# Patient Record
Sex: Female | Born: 1975
Health system: Southern US, Community
[De-identification: ages and names within clinical notes are randomized; demographics above are authoritative.]

## PROBLEM LIST (undated history)

## (undated) DIAGNOSIS — C801 Malignant (primary) neoplasm, unspecified: Secondary | ICD-10-CM

## (undated) DIAGNOSIS — Z923 Personal history of irradiation: Secondary | ICD-10-CM

## (undated) DIAGNOSIS — T7840XA Allergy, unspecified, initial encounter: Secondary | ICD-10-CM

## (undated) DIAGNOSIS — R55 Syncope and collapse: Secondary | ICD-10-CM

## (undated) DIAGNOSIS — Z9221 Personal history of antineoplastic chemotherapy: Secondary | ICD-10-CM

## (undated) DIAGNOSIS — C50919 Malignant neoplasm of unspecified site of unspecified female breast: Secondary | ICD-10-CM

## (undated) DIAGNOSIS — Z853 Personal history of malignant neoplasm of breast: Secondary | ICD-10-CM

## (undated) DIAGNOSIS — I471 Supraventricular tachycardia: Secondary | ICD-10-CM

## (undated) DIAGNOSIS — R002 Palpitations: Secondary | ICD-10-CM

## (undated) DIAGNOSIS — L989 Disorder of the skin and subcutaneous tissue, unspecified: Secondary | ICD-10-CM

## (undated) DIAGNOSIS — D649 Anemia, unspecified: Secondary | ICD-10-CM

## (undated) DIAGNOSIS — I4719 Other supraventricular tachycardia: Secondary | ICD-10-CM

## (undated) HISTORY — PX: BREAST LUMPECTOMY: SHX2

## (undated) HISTORY — DX: Allergy, unspecified, initial encounter: T78.40XA

## (undated) HISTORY — DX: Palpitations: R00.2

## (undated) HISTORY — DX: Syncope and collapse: R55

## (undated) HISTORY — PX: LAPAROSCOPIC OOPHERECTOMY: SHX6507

## (undated) HISTORY — DX: Other supraventricular tachycardia: I47.19

## (undated) HISTORY — DX: Supraventricular tachycardia: I47.1

## (undated) HISTORY — PX: AUGMENTATION MAMMAPLASTY: SUR837

## (undated) HISTORY — PX: MASTECTOMY: SHX3

---
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
## 2004-09-18 ENCOUNTER — Ambulatory Visit: Payer: Self-pay | Admitting: Obstetrics & Gynecology

## 2005-07-31 ENCOUNTER — Inpatient Hospital Stay: Payer: Self-pay | Admitting: Obstetrics & Gynecology

## 2008-10-29 ENCOUNTER — Ambulatory Visit (HOSPITAL_COMMUNITY): Admission: RE | Admit: 2008-10-29 | Discharge: 2008-10-29 | Payer: Self-pay | Admitting: Obstetrics and Gynecology

## 2009-03-17 ENCOUNTER — Inpatient Hospital Stay (HOSPITAL_COMMUNITY): Admission: RE | Admit: 2009-03-17 | Discharge: 2009-03-19 | Payer: Self-pay | Admitting: Obstetrics and Gynecology

## 2011-03-24 LAB — CBC
HCT: 32.4 % — ABNORMAL LOW (ref 36.0–46.0)
Hemoglobin: 10.6 g/dL — ABNORMAL LOW (ref 12.0–15.0)
Hemoglobin: 9.9 g/dL — ABNORMAL LOW (ref 12.0–15.0)
MCV: 86.4 fL (ref 78.0–100.0)
Platelets: 197 10*3/uL (ref 150–400)
WBC: 6.6 10*3/uL (ref 4.0–10.5)

## 2011-03-24 LAB — CCBB MATERNAL DONOR DRAW

## 2011-04-27 NOTE — Discharge Summary (Signed)
NAME:  Nancy Austin, Nancy Austin                 ACCOUNT NO.:  [id number]   MEDICAL RECORD NO.:  [id number]          PATIENT TYPE:  INP   LOCATION:  9108                          FACILITY:  [facility]   PHYSICIAN:  Huel Cote, M.D. DATE OF BIRTH:  1976/07/25   DATE OF ADMISSION:  03/17/2009  DATE OF DISCHARGE:  03/19/2009                               DISCHARGE SUMMARY   DISCHARGE DIAGNOSES:  1. Term pregnancy at 39-6/7th weeks, delivered.  2. Status post normal spontaneous vaginal delivery.   DISCHARGE MEDICATIONS:  Motrin 600 mg p.o. every 6 hours.   DISCHARGE FOLLOWUP:  The patient is to follow up in the office in 6  weeks for her full postpartum exam.   HOSPITAL COURSE:  The patient is a 35 year old G2, P1-0-0-1, who was  admitted at 39-6/7th weeks' gestation for induction of labor, given term  status and a favorable cervix.  Prenatal care was complicated by cleft  lip on the fetal left.  There were no other findings or issues noted.  The patient who has met with plastic surgeons in prior to delivery in  order to prepare for the repair of this after birth.   PRENATAL LABORATORY DATA:  Are as follows:  O positive, antibody negative, rubella immune, hepatitis B surface  antigen negative, HIV negative, GC negative, chlamydia negative, RPR  negative, group B strep negative, 1-hour Glucola 82.  First trimester  screen normal.   PAST OBSTETRICAL HISTORY:  Significant for a vaginal delivery, 7 pounds  11 ounces infant in 2006.   PAST GYN HISTORY:  History of irregular cycles, conceived her first  child on Clomid; however, spontaneously this pregnancy.   PAST SURGICAL HISTORY:  Tonsillectomy and wisdom teeth.   PAST MEDICAL HISTORY:  None.   ALLERGIES:  Morphine.   MEDICATIONS:  Prenatal vitamins.   On admission she was 50.  Cervix was 50% effaced, 2 cm, and -2 station.  She had rupture of membranes with clear fluid noted.  Fetal heart rate  was reactive.  She progressed quickly  throughout the day, went to  complete dilation, and pushed well with a normal spontaneous vaginal  delivery of a vigorous female infant over a small first-degree  laceration.  Apgars were 8 and 9, weight was 7 pounds 2 ounces.  Baby  had a cleft lip on the left and the palate looked intact.  Placenta  delivered spontaneously.  There is a first-degree laceration repaired  with 2-0 Vicryl for hemostasis.  Cervix and rectum were intact.  Estimated blood loss was 350 mL.  Baby was doing well.  The mother  began nursing and did well postpartum.  On postpartum day #2, she was  having no complaints.  Pain was well controlled.  Hemoglobin was 9.9.  The baby was latching well and nursing well and she had her discharge  appointment set up with the plastic surgeon.      Huel Cote, M.D.  Electronically Signed     KR/MEDQ  D:  03/19/2009  T:  03/19/2009  Job:  [id number]

## 2011-09-23 ENCOUNTER — Ambulatory Visit
Admission: RE | Admit: 2011-09-23 | Discharge: 2011-09-23 | Disposition: A | Discharge status: Home / Self Care | Payer: Commercial Managed Care - PPO | Source: Ambulatory Visit | Attending: Family Medicine | Admitting: Family Medicine

## 2011-09-23 ENCOUNTER — Other Ambulatory Visit: Payer: Self-pay | Admitting: Family Medicine

## 2011-09-23 DIAGNOSIS — M545 Low back pain: Secondary | ICD-10-CM

## 2011-09-28 ENCOUNTER — Other Ambulatory Visit: Payer: Self-pay | Admitting: Family Medicine

## 2011-09-28 DIAGNOSIS — M549 Dorsalgia, unspecified: Secondary | ICD-10-CM

## 2014-09-12 ENCOUNTER — Other Ambulatory Visit: Payer: Self-pay | Admitting: Obstetrics and Gynecology

## 2014-09-12 DIAGNOSIS — N63 Unspecified lump in unspecified breast: Secondary | ICD-10-CM

## 2014-09-12 DIAGNOSIS — Z853 Personal history of malignant neoplasm of breast: Secondary | ICD-10-CM

## 2014-09-12 HISTORY — DX: Personal history of malignant neoplasm of breast: Z85.3

## 2014-09-17 ENCOUNTER — Other Ambulatory Visit: Payer: Self-pay | Admitting: Obstetrics and Gynecology

## 2014-09-17 DIAGNOSIS — N63 Unspecified lump in unspecified breast: Secondary | ICD-10-CM

## 2014-09-20 ENCOUNTER — Ambulatory Visit
Admission: RE | Admit: 2014-09-20 | Discharge: 2014-09-20 | Disposition: A | Discharge status: Home / Self Care | Payer: 59 | Source: Ambulatory Visit | Attending: Obstetrics and Gynecology | Admitting: Obstetrics and Gynecology

## 2014-09-20 ENCOUNTER — Other Ambulatory Visit: Payer: Self-pay | Admitting: Obstetrics and Gynecology

## 2014-09-20 ENCOUNTER — Encounter (INDEPENDENT_AMBULATORY_CARE_PROVIDER_SITE_OTHER): Payer: Self-pay

## 2014-09-20 DIAGNOSIS — N63 Unspecified lump in unspecified breast: Secondary | ICD-10-CM

## 2014-09-23 ENCOUNTER — Other Ambulatory Visit: Payer: Self-pay | Admitting: Obstetrics and Gynecology

## 2014-09-23 DIAGNOSIS — C50912 Malignant neoplasm of unspecified site of left female breast: Secondary | ICD-10-CM

## 2014-09-24 ENCOUNTER — Other Ambulatory Visit: Payer: Self-pay | Admitting: *Deleted

## 2014-09-24 ENCOUNTER — Telehealth: Payer: Self-pay | Admitting: *Deleted

## 2014-09-24 ENCOUNTER — Encounter: Payer: Self-pay | Admitting: *Deleted

## 2014-09-24 DIAGNOSIS — C50412 Malignant neoplasm of upper-outer quadrant of left female breast: Secondary | ICD-10-CM

## 2014-09-24 DIAGNOSIS — Z170 Estrogen receptor positive status [ER+]: Secondary | ICD-10-CM | POA: Insufficient documentation

## 2014-09-24 NOTE — Telephone Encounter (Signed)
Confirmed [facility] for 10/02/14 at 1200.  Instructions and contact information given. 

## 2014-09-30 ENCOUNTER — Ambulatory Visit (HOSPITAL_COMMUNITY)
Admission: RE | Admit: 2014-09-30 | Discharge: 2014-09-30 | Disposition: A | Discharge status: Home / Self Care | Payer: 59 | Source: Ambulatory Visit | Attending: Obstetrics and Gynecology | Admitting: Obstetrics and Gynecology

## 2014-09-30 DIAGNOSIS — C50912 Malignant neoplasm of unspecified site of left female breast: Secondary | ICD-10-CM | POA: Diagnosis present

## 2014-09-30 DIAGNOSIS — C50412 Malignant neoplasm of upper-outer quadrant of left female breast: Secondary | ICD-10-CM | POA: Insufficient documentation

## 2014-09-30 MED ORDER — GADOBENATE DIMEGLUMINE 529 MG/ML IV SOLN
12.0000 mL | Freq: Once | INTRAVENOUS | Status: AC | PRN
  Administered 2014-09-30: 12 mL via INTRAVENOUS

## 2014-10-02 ENCOUNTER — Ambulatory Visit (HOSPITAL_BASED_OUTPATIENT_CLINIC_OR_DEPARTMENT_OTHER): Payer: 59 | Admitting: Oncology

## 2014-10-02 ENCOUNTER — Ambulatory Visit: Payer: 59

## 2014-10-02 ENCOUNTER — Encounter: Payer: Self-pay | Admitting: Radiation Oncology

## 2014-10-02 ENCOUNTER — Telehealth: Payer: Self-pay | Admitting: Oncology

## 2014-10-02 ENCOUNTER — Encounter: Payer: Self-pay | Admitting: Oncology

## 2014-10-02 ENCOUNTER — Ambulatory Visit: Payer: 59 | Attending: General Surgery | Admitting: Physical Therapy

## 2014-10-02 ENCOUNTER — Other Ambulatory Visit (HOSPITAL_BASED_OUTPATIENT_CLINIC_OR_DEPARTMENT_OTHER): Payer: 59

## 2014-10-02 ENCOUNTER — Ambulatory Visit
Admission: RE | Admit: 2014-10-02 | Discharge: 2014-10-02 | Disposition: A | Discharge status: Home / Self Care | Payer: 59 | Source: Ambulatory Visit | Attending: Radiation Oncology | Admitting: Radiation Oncology

## 2014-10-02 ENCOUNTER — Other Ambulatory Visit: Payer: Self-pay | Admitting: *Deleted

## 2014-10-02 VITALS — BP 122/81 | HR 68 | Temp 98.5°F | Resp 18 | Ht 66.0 in | Wt 142.3 lb

## 2014-10-02 DIAGNOSIS — C50412 Malignant neoplasm of upper-outer quadrant of left female breast: Secondary | ICD-10-CM

## 2014-10-02 DIAGNOSIS — C50919 Malignant neoplasm of unspecified site of unspecified female breast: Secondary | ICD-10-CM | POA: Insufficient documentation

## 2014-10-02 DIAGNOSIS — Z170 Estrogen receptor positive status [ER+]: Secondary | ICD-10-CM

## 2014-10-02 LAB — CBC WITH DIFFERENTIAL/PLATELET
BASO%: 0.2 % (ref 0.0–2.0)
Basophils Absolute: 0 10*3/uL (ref 0.0–0.1)
EOS%: 0.5 % (ref 0.0–7.0)
Eosinophils Absolute: 0 10*3/uL (ref 0.0–0.5)
HCT: 41.5 % (ref 34.8–46.6)
HGB: 13.8 g/dL (ref 11.6–15.9)
LYMPH#: 1.5 10*3/uL (ref 0.9–3.3)
LYMPH%: 22.7 % (ref 14.0–49.7)
MCH: 29.2 pg (ref 25.1–34.0)
MCHC: 33.3 g/dL (ref 31.5–36.0)
MCV: 87.9 fL (ref 79.5–101.0)
MONO#: 0.3 10*3/uL (ref 0.1–0.9)
MONO%: 4 % (ref 0.0–14.0)
NEUT%: 72.6 % (ref 38.4–76.8)
NEUTROS ABS: 4.7 10*3/uL (ref 1.5–6.5)
Platelets: 259 10*3/uL (ref 145–400)
RBC: 4.72 10*6/uL (ref 3.70–5.45)
RDW: 12.3 % (ref 11.2–14.5)
WBC: 6.5 10*3/uL (ref 3.9–10.3)

## 2014-10-02 LAB — COMPREHENSIVE METABOLIC PANEL
ALBUMIN: 3.9 g/dL (ref 3.5–5.2)
ALT: 16 U/L (ref 0–35)
AST: 20 U/L (ref 0–37)
Alkaline Phosphatase: 45 U/L (ref 39–117)
BILIRUBIN TOTAL: 0.4 mg/dL (ref 0.3–1.2)
BUN: 11 mg/dL (ref 6–23)
CALCIUM: 9.4 mg/dL (ref 8.4–10.5)
CHLORIDE: 104 meq/L (ref 96–112)
CO2: 26 mEq/L (ref 19–32)
CREATININE: 0.87 mg/dL (ref 0.50–1.10)
GLUCOSE: 103 mg/dL — AB (ref 70–99)
Potassium: 3.9 mEq/L (ref 3.5–5.3)
Sodium: 144 mEq/L (ref 135–145)
TOTAL PROTEIN: 7.3 g/dL (ref 6.0–8.3)

## 2014-10-02 NOTE — Progress Notes (Signed)
Checked in new pt with no financial concerns at this time.  Informed pt if chemo is part of her treatment plan [employer] doesn't req auth for [facility] employees but if they leave her with a balance for chemo Raquel will get in touch with different foundations that offer copay assistance if needed. Pt has Raquel's card for any questions or concerns.

## 2014-10-02 NOTE — Progress Notes (Signed)
Radiation Oncology         [phone] ________________________________  Initial outpatient Consultation  Name: Nancy Austin MRN: [id number]  Date: 10/02/2014  DOB: 1976/08/24  FW:YOVZCHYIFO,YDXAJ W, MD  Rolm Bookbinder, MD   REFERRING PHYSICIAN: Rolm Bookbinder, MD  DIAGNOSIS: Clinical T1c N0, stage IA invasive ductal carcinoma, grade 2, estrogen and progesterone receptor positive, HER-2 amplified  HISTORY OF PRESENT ILLNESS::Nancy Austin is a 38 y.o. female who is seen out of the courtesy of Dr. Rolm Bookbinder as part of the multidisciplinary breast clinic. Earlier this fall the patient palpated a lump within the upper outer aspect of her left breast. She brought this to the attention of her gynecologist and bilateral digital diagnostic mammography and left breast ultrasonography was performed at the breast Center. This was the patient's first breast imaging ever.. Patient was noted to have a suspicious pleomorphic area of calcifications spanning over approximately 1.1 cm in the area of concern. Patient proceeded to undergo biopsy of this area which revealed invasive ductal carcinoma, grade 2. The tumor was estrogen and progesterone receptor positive with strong staining. Proliferation marker was 16%. The HER-2/neu was amplified at 5.07. Patient proceeded to undergo MRI which revealed a solitary 1.7 cm mass within the upper-outer quadrant of the left breast. There where no other suspicious areas in either breast or abnormal appearing lymphadenopathy. With this information the patient presents to the multidisciplinary clinic.  PREVIOUS RADIATION THERAPY: No  PAST MEDICAL HISTORY:  has a past medical history of Breast cancer of upper-outer quadrant of left female breast (09/24/2014).    PAST SURGICAL HISTORY: Past Surgical History  Procedure Laterality Date  . Tonsillectomy  1996    FAMILY HISTORY: family history includes Multiple myeloma in her maternal grandmother.  SOCIAL  HISTORY:  reports that she has never smoked. She does not have any smokeless tobacco history on file. She reports that she drinks about 1.8 ounces of alcohol per week. She reports that she does not use illicit drugs.  ALLERGIES: Morphine and related  MEDICATIONS:  Current Outpatient Prescriptions  Medication Sig Dispense Refill  . diphenhydramine-acetaminophen (TYLENOL PM) 25-500 MG TABS Take 1 tablet by mouth at bedtime as needed.       No current facility-administered medications for this encounter.    REVIEW OF SYSTEMS:  A 15 point review of systems is documented in the electronic medical record. This was obtained by the nursing staff. However, I reviewed this with the patient to discuss relevant findings and make appropriate changes.  Prior to diagnosis the patient denied any pain within the left or right breast nipple discharge or bleeding. Patient denies any new bony pain headaches dizziness or blurred vision. Patient denies any appetite changes her weight loss.   PHYSICAL EXAM:  Vitals - 1 value per visit 28/78/6767  SYSTOLIC 209  DIASTOLIC 81  Pulse 68  Temperature 98.5  Respirations 18  Weight (lb) 142.3  Height 5' 6"   BMI 22.98  VISIT REPORT    In Gen. this is a very pleasant healthy-appearing 38 year old female in no acute distress. She is accompanied by her husband and mother on evaluation today. Examination of the neck and supraclavicular region reveals no evidence of adenopathy. The axillary areas are free of adenopathy. Examination of the lungs reveals them to be clear. The heart has a regular rhythm and rate. Examination of the right breast reveals no palpable mass or nipple discharge. Examination of the left breast reveals a palpable mass in the upper outer quadrant  which is estimated to be approximately 1.5 x 1.5 cm but somewhat difficult to estimate in light of associated bruising in this area. The left nipple is slightly retracted which the patient admits has been a  chronic issue. No nipple discharge or bleeding noted.   ECOG = 0  0 - Asymptomatic (Fully active, able to carry on all predisease activities without restriction)  LABORATORY DATA:  Lab Results  Component Value Date   WBC 6.5 10/02/2014   HGB 13.8 10/02/2014   HCT 41.5 10/02/2014   MCV 87.9 10/02/2014   PLT 259 10/02/2014   NEUTROABS 4.7 10/02/2014   Lab Results  Component Value Date   NA 144 10/02/2014   K 3.9 10/02/2014   CL 104 10/02/2014   CO2 26 10/02/2014   GLUCOSE 103* 10/02/2014   CREATININE 0.87 10/02/2014   CALCIUM 9.4 10/02/2014      RADIOGRAPHY: Mr Breast Bilateral W Wo Contrast  10/01/2014   CLINICAL DATA:  New diagnosis of left breast cancer. Ultrasound-guided core biopsy of mass in the 3 o'clock location of the left breast shows invasive ductal carcinoma.  LABS:  Not applicable  EXAM: BILATERAL BREAST MRI WITH AND WITHOUT CONTRAST  TECHNIQUE: Multiplanar, multisequence MR images of both breasts were obtained prior to and following the intravenous administration of 74m of MultiHance.  THREE-DIMENSIONAL MR IMAGE RENDERING ON INDEPENDENT WORKSTATION:  Three-dimensional MR images were rendered by post-processing of the original MR data on an independent workstation. The three-dimensional MR images were interpreted, and findings are reported in the following complete MRI report for this study. Three dimensional images were evaluated at the independent DynaCad workstation  COMPAR[facility]  [facility] and earlier  FINDINGS: Breast composition: c:  Heterogeneous fibroglandular tissue  Background parenchymal enhancement: Moderate  Right breast: No mass or abnormal enhancement.  Left breast: Within the upper-outer quadrant of the left breast there is an enhancing mass associated with clip artifact. Mass measures 1.7 x 1.7 x 1.6 cm and demonstrates rapid wash-in and plateau type enhancement kinetics. No other suspicious findings  are identified in the left breast.  Lymph nodes: No abnormal appearing lymph nodes.  Ancillary findings:  None.  IMPRESSION: 1. 1.7 cm mass consistent with known malignancy in the upper-outer quadrant of the left breast. 2. No suspicious findings in the right breast.  RECOMMENDATION: Treatment plan  BI-RADS CATEGORY  6: Known biopsy-proven malignancy.   Electronically Signed   By: BShon HaleM.D.   On: 10/01/2014 09:42   Mm Digital Diagnostic Bilat  09/20/2014   CLINICAL DATA:  38year old with a palpable mass 3 o'clock position left breast.  EXAM: DIGITAL DIAGNOSTIC  BILATERAL MAMMOGRAM WITH CAD  ULTRASOUND LEFT BREAST  COMPARISON:  None.  ACR Breast Density Category c: The breast tissue is heterogeneously dense, which may obscure small masses.  FINDINGS: A skin BB was placed at the site of palpable concern in the outer left breast. Corresponding with the palpable area of concern, there are suspicious pleomorphic calcifications that were evaluated with magnification views. These calcifications span 1.1 x 0.8 x 1.0 cm. A definite/discrete mass is not confirmed mammographically. There is a circumscribed oval mass in the 12 o'clock region of the left breast, separate from the palpable area of concern.  No mass, distortion, or suspicious microcalcification is identified in the right breast.  Mammographic images were processed with CAD.  On physical exam, I palpate a very firm approximate 1.5 cm mass at 3 o'clock position  left breast 2-3 cm from the nipple.  Ultrasound is performed, showing an irregular hypoechoic mass with some internal echogenic foci consistent with microcalcifications is seen at 3 o'clock position 2 to 3 cm from the nipple. On ultrasound, the mass measures 1.8 x 1.1 x 1.7 cm. Are some linear areas extension along the margin of the mass, suggesting abnormal ducts.  Multiple cysts are noted in the 12-1 o'clock region of the left breast.  Ultrasound of the left axilla demonstrates a normal left  axillary lymph node. No lymphadenopathy is detected.  IMPRESSION: 1. Suspicious mass with associated pleomorphic calcifications in the 3 o'clock position of the left breast corresponds to the palpable mass. Findings are highly suggestive of malignancy. 2. No evidence of malignancy in the right breast.  RECOMMENDATION: Ultrasound-guided core needle biopsy of the palpable left breast mass is recommended. The findings and recommendations were discussed with the patient and her husband in person today. The patient is able to stay for biopsy, which will be performed today and dictated separately.  I have discussed the findings and recommendations with the patient. Results were also provided in writing at the conclusion of the visit. If applicable, a reminder letter will be sent to the patient regarding the next appointment.  BI-RADS CATEGORY  5: Highly suggestive of malignancy.   Electronically Signed   By: Curlene Dolphin M.D.   On: 09/20/2014 16:10   Mm Digital Diagnostic Unilat L  09/20/2014   CLINICAL DATA:  Biopsy was performed of a palpable mass in the left breast 3 o'clock position.  EXAM: DIAGNOSTIC LEFT MAMMOGRAM POST ULTRASOUND BIOPSY  COMPARISON:  Previous exams  FINDINGS: Mammographic images were obtained following ultrasound guided biopsy of suspicious mass left breast 3 o'clock position. A ribbon shaped biopsy clip is satisfactorily positioned in the 3 o'clock position of the left breast within an area of added density. The pleomorphic calcifications are just deep to the biopsied mass.  IMPRESSION: Satisfactory position of ribbon shaped biopsy clip in the outer left breast.  Final Assessment: Post Procedure Mammograms for Marker Placement   Electronically Signed   By: Curlene Dolphin M.D.   On: 09/20/2014 16:37   US Breast Ltd Uni Left Inc Axilla  09/20/2014   CLINICAL DATA:  38 year old with a palpable mass 3 o'clock position left breast.  EXAM: DIGITAL DIAGNOSTIC  BILATERAL MAMMOGRAM WITH CAD   ULTRASOUND LEFT BREAST  COMPARISON:  None.  ACR Breast Density Category c: The breast tissue is heterogeneously dense, which may obscure small masses.  FINDINGS: A skin BB was placed at the site of palpable concern in the outer left breast. Corresponding with the palpable area of concern, there are suspicious pleomorphic calcifications that were evaluated with magnification views. These calcifications span 1.1 x 0.8 x 1.0 cm. A definite/discrete mass is not confirmed mammographically. There is a circumscribed oval mass in the 12 o'clock region of the left breast, separate from the palpable area of concern.  No mass, distortion, or suspicious microcalcification is identified in the right breast.  Mammographic images were processed with CAD.  On physical exam, I palpate a very firm approximate 1.5 cm mass at 3 o'clock position left breast 2-3 cm from the nipple.  Ultrasound is performed, showing an irregular hypoechoic mass with some internal echogenic foci consistent with microcalcifications is seen at 3 o'clock position 2 to 3 cm from the nipple. On ultrasound, the mass measures 1.8 x 1.1 x 1.7 cm. Are some linear areas extension along the margin of  the mass, suggesting abnormal ducts.  Multiple cysts are noted in the 12-1 o'clock region of the left breast.  Ultrasound of the left axilla demonstrates a normal left axillary lymph node. No lymphadenopathy is detected.  IMPRESSION: 1. Suspicious mass with associated pleomorphic calcifications in the 3 o'clock position of the left breast corresponds to the palpable mass. Findings are highly suggestive of malignancy. 2. No evidence of malignancy in the right breast.  RECOMMENDATION: Ultrasound-guided core needle biopsy of the palpable left breast mass is recommended. The findings and recommendations were discussed with the patient and her husband in person today. The patient is able to stay for biopsy, which will be performed today and dictated separately.  I have  discussed the findings and recommendations with the patient. Results were also provided in writing at the conclusion of the visit. If applicable, a reminder letter will be sent to the patient regarding the next appointment.  BI-RADS CATEGORY  5: Highly suggestive of malignancy.   Electronically Signed   By: Curlene Dolphin M.D.   On: 09/20/2014 16:10   [place] Lt Breast Bx W Loc Dev 1st Lesion Img Bx Spec US Guide  09/30/2014   ADDENDUM REPORT: 09/23/2014 11:21  ADDENDUM: Pathology revealed grade II invasive ductal carcinoma and ductal carcinoma in situ in the left breast. This was found to be concordant by Dr. Leonides Sake. Pathology was discussed with the patient by telephone. She reported doing well after the biopsy. Post biopsy instructions were reviewed and her questions were answered. [facility] has been [facility] October [facility], 2014. A bilateral breast MRI will be scheduled. She is encouraged to come to [facility] for educational materials. My number was provided for future questions and concerns.  Pathology results reported by Susa Raring RN, BSN on September 23, 2014.   Electronically Signed   By: Curlene Dolphin M.D.   On: 09/23/2014 11:21   09/30/2014   CLINICAL DATA:  Suspicious palpable mass with associated pleomorphic calcifications 3 o'clock position left breast. Biopsy was recommended.  EXAM: ULTRASOUND GUIDED LEFT BREAST CORE NEEDLE BIOPSY  COMPARISON:  Previous exams.  PROCEDURE: I met with the patient and we discussed the procedure of ultrasound-guided biopsy, including benefits and alternatives. We discussed the high likelihood of a successful procedure. We discussed the risks of the procedure including infection, bleeding, tissue injury, clip migration, and inadequate sampling. Informed written consent was given. The usual time-out protocol was performed immediately prior to the procedure.  Using sterile technique and 2%  Lidocaine as local anesthetic, under direct ultrasound visualization, a 12 gauge vacuum-assisted device was used to perform biopsy of a suspicious palpable mass at 3 o'clock position using a lateral to medial approach. At the conclusion of the procedure, a tissue marker clip was deployed into the biopsy cavity. Follow-up 2-view mammogram was performed and dictated separately.  IMPRESSION: Ultrasound-guided biopsy of left breast mass. No apparent complications.  Electronically Signed: By: Curlene Dolphin M.D. On: 09/20/2014 16:12      IMPRESSION: Cinical T1c N0, stage IA invasive ductal carcinoma. The patient would appear to be a good candidate for breast conservation with radiation therapy directed at the left breast. Current recommendations are for the patient to proceed with neoadjuvant chemotherapy followed by surgery and then radiation therapy as breast conserving treatment. At a later date the patient will proceed with adjuvant hormonal therapy. Today I discussed the radiation therapy in general terms highlighting the course of  treatment side effects and potential long-term toxicities. Patient appears interested in breast conservation therapy with radiation therapy as part of his treatment.   PLAN: The patient will be seen in the postoperative setting for further evaluation of breast conserving treatment.     ------------------------------------------------  Blair Promise, PhD, MD

## 2014-10-02 NOTE — Telephone Encounter (Signed)
[facility] FOR PT RE NEXT APPTS FOR 10/26, 10/29 AND 11/4. SCHEDULE MAILED. ECHO TO [place].

## 2014-10-02 NOTE — Progress Notes (Signed)
[facility]  Telephone:([phone]) [phone] Fax:(336) [phone]     ID: Nancy Austin DOB: 1975-12-29  MR#: [id number]  VZS#:[id number]  Patient Care Team: Nancy Bores, MD as PCP - General (Obstetrics and Gynecology) Nancy Bookbinder, MD as Consulting Physician (General Surgery) Nancy Eisenmenger, MD as Consulting Physician (Hematology and Oncology) Nancy Promise, MD as Consulting Physician (Radiation Oncology) OTHER MD:  CHIEF COMPLAINT: Triple positive breast cancer  CURRENT TREATMENT: To start neoadjuvant chemotherapy   BREAST CANCER HISTORY: Nancy Austin herself found a lump in her left breast early October 2015 and brought it to her gynecologist attention. On 09/20/2014 she was set up for bilateral diagnostic mammography and left breast ultrasonography of the breast Center. This was the patient's first ever mammogram. In the area of concern in the left breast there was suspicious pleomorphic calcifications spanning 1.1 cm. There was no discrete mammographic mass in this dense breasts (category C.). On physical exam, there was a palpable firm at 1.5 cm mass at the 3:00 position in the left breast. By ultrasound this was irregular and hypoechoic and measured 1.8 cm. Ultrasound of the left axilla was unremarkable. Aside from multiple cysts in the left breast there were no other findings of concern.  On the same day, 09/20/2014, the patient underwent biopsy of the left breast palpable mass. This showed ([id number]) and invasive ductal carcinoma, grade 2, estrogen receptor 100% positive, progesterone receptor 89% positive, both with strong staining intensity, with an MIB-1 of 16%. HER-2 was amplified with a signals ratio of 5.07 and a copy number per cell of 6.85. Paragraph on 09/30/2014 the patient underwent bilateral breast MRIs. This showed a 1.7 cm mass in the upper outer quadrant of the left breast. There was no other suspicious finding in either breast and no abnormal appearing  adenopathy.  The patient's subsequent history is as detailed below.  INTERVAL HISTORY: Nancy Austin was evaluated in the multidisciplinary breast cancer clinic 10/02/2014 accompanied by her husband Nancy Austin and her mother. Her case was also discussed at the multidisciplinary breast cancer conference that same morning.Marland Kitchen  REVIEW OF SYSTEMS:  aside from the left breast mass itself, there were no suspicious symptoms leading to the initial mammogram. The patient denies unusual headaches, visual changes, nausea, vomiting, stiff neck, dizziness, or gait imbalance. There has been no cough, phlegm production, or pleurisy, no chest pain or pressure, and no change in bowel or bladder habits. The patient denies fever, rash, bleeding, unexplained fatigue or unexplained weight loss. A detailed review of systems was otherwise entirely negative.   PAST MEDICAL HISTORY: Past Medical History  Diagnosis Date  . Breast cancer of upper-outer quadrant of left female breast 09/24/2014    PAST SURGICAL HISTORY: Past Surgical History  Procedure Laterality Date  . Tonsillectomy  1996    FAMILY HISTORY Family History  Problem Relation Age of Onset  . Multiple myeloma Maternal Grandmother   The patient's parents are both living. The patient has one brother, no sisters. One grandmother was diagnosed with multiple myeloma at age 68. There is no history of breast or ovarian cancer in the family.   GYNECOLOGIC HISTORY:  Patient's last menstrual period was 09/28/2014.  menarche age 25, first live birth age 17. The patient is GX P2. She still having regular periods. She was on birth control pills on and off for the last 15 years, stopping in October of 2015.   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  Her husband Nancy Austin works as a Immunologist at [employer]. Their children are Sam age 68 and Terrence Dupont age 3.    ADVANCED DIRECTIVES: In place   HEALTH MAINTENANCE: History  Substance Use Topics    . Smoking status: Never Smoker   . Smokeless tobacco: Not on file  . Alcohol Use: 1.8 oz/week    3 Glasses of wine per week     Colonoscopy:  PAP:  November 2014  Bone density:  Lipid panel:  Allergies  Allergen Reactions  . Morphine And Related Nausea And Vomiting    Current Outpatient Prescriptions  Medication Sig Dispense Refill  . diphenhydramine-acetaminophen (TYLENOL PM) 25-500 MG TABS Take 1 tablet by mouth at bedtime as needed.       No current facility-administered medications for this visit.    OBJECTIVE: young white woman in no acute distress  Filed Vitals:   10/02/14 1254  BP: 122/81  Pulse: 68  Temp: 98.5 F (36.9 C)  Resp: 18     Body mass index is 22.98 kg/(m^2).    ECOG FS:0 - Asymptomatic  Ocular: Sclerae unicteric, pupils  round and equal  Ear-nose-throat: Oropharynx clearand moist  Lymphatic: No cervical or supraclavicular adenopathy Lungs no rales or rhonchi, good excursion bilaterally Heart regular rate and rhythm, no murmur appreciated Abd soft, nontender, positive bowel sounds MSK no focal spinal tenderness, no joint edema Neuro: non-focal, well-oriented, appropriate affect Breasts: The right breast is unremarkable. The left breast is status post recent biopsy. There is a small ecchymosis at the biopsy site. This confuses the palpation of the mass and accurate measurements will be given at the next visit. There is no other skin or nipple change of concern. The left axilla is benign.   LAB RESULTS:  CMP     Component Value Date/Time   NA 144 10/02/2014 1206   K 3.9 10/02/2014 1206   CL 104 10/02/2014 1206   CO2 26 10/02/2014 1206   GLUCOSE 103* 10/02/2014 1206   BUN 11 10/02/2014 1206   CREATININE 0.87 10/02/2014 1206   CALCIUM 9.4 10/02/2014 1206   PROT 7.3 10/02/2014 1206   ALBUMIN 3.9 10/02/2014 1206   AST 20 10/02/2014 1206   ALT 16 10/02/2014 1206   ALKPHOS 45 10/02/2014 1206   BILITOT 0.4 10/02/2014 1206    I No results  found for this basename: SPEP, UPEP,  kappa and lambda light chains    Lab Results  Component Value Date   WBC 6.5 10/02/2014   NEUTROABS 4.7 10/02/2014   HGB 13.8 10/02/2014   HCT 41.5 10/02/2014   MCV 87.9 10/02/2014   PLT 259 10/02/2014      Chemistry      Component Value Date/Time   NA 144 10/02/2014 1206   K 3.9 10/02/2014 1206   CL 104 10/02/2014 1206   CO2 26 10/02/2014 1206   BUN 11 10/02/2014 1206   CREATININE 0.87 10/02/2014 1206      Component Value Date/Time   CALCIUM 9.4 10/02/2014 1206   ALKPHOS 45 10/02/2014 1206   AST 20 10/02/2014 1206   ALT 16 10/02/2014 1206   BILITOT 0.4 10/02/2014 1206       No results found for this basename: LABCA2    No components found with this basename: LABCA125    No results found for this basename: INR,  in the last 168 hours  Urinalysis No results found for this basename: colorurine, appearanceur, labspec, phurine, glucoseu, hgbur, bilirubinur, ketonesur, proteinur, urobilinogen, nitrite, leukocytesur  STUDIES: Mr Breast Bilateral W Wo Contrast  10/01/2014   CLINICAL DATA:  New diagnosis of left breast cancer. Ultrasound-guided core biopsy of mass in the 3 o'clock location of the left breast shows invasive ductal carcinoma.  LABS:  Not applicable  EXAM: BILATERAL BREAST MRI WITH AND WITHOUT CONTRAST  TECHNIQUE: Multiplanar, multisequence MR images of both breasts were obtained prior to and following the intravenous administration of 56m of MultiHance.  THREE-DIMENSIONAL MR IMAGE RENDERING ON INDEPENDENT WORKSTATION:  Three-dimensional MR images were rendered by post-processing of the original MR data on an independent workstation. The three-dimensional MR images were interpreted, and findings are reported in the following complete MRI report for this study. Three dimensional images were evaluated at the independent DynaCad workstation  COMPAR[facility]:  [facility] and  earlier  FINDINGS: Breast composition: c:  Heterogeneous fibroglandular tissue  Background parenchymal enhancement: Moderate  Right breast: No mass or abnormal enhancement.  Left breast: Within the upper-outer quadrant of the left breast there is an enhancing mass associated with clip artifact. Mass measures 1.7 x 1.7 x 1.6 cm and demonstrates rapid wash-in and plateau type enhancement kinetics. No other suspicious findings are identified in the left breast.  Lymph nodes: No abnormal appearing lymph nodes.  Ancillary findings:  None.  IMPRESSION: 1. 1.7 cm mass consistent with known malignancy in the upper-outer quadrant of the left breast. 2. No suspicious findings in the right breast.  RECOMMENDATION: Treatment plan  BI-RADS CATEGORY  6: Known biopsy-proven malignancy.   Electronically Signed   By: BShon HaleM.D.   On: 10/01/2014 09:42   Mm Digital Diagnostic Bilat  09/20/2014   CLINICAL DATA:  38year old with a palpable mass 3 o'clock position left breast.  EXAM: DIGITAL DIAGNOSTIC  BILATERAL MAMMOGRAM WITH CAD  ULTRASOUND LEFT BREAST  COMPARISON:  None.  ACR Breast Density Category c: The breast tissue is heterogeneously dense, which may obscure small masses.  FINDINGS: A skin BB was placed at the site of palpable concern in the outer left breast. Corresponding with the palpable area of concern, there are suspicious pleomorphic calcifications that were evaluated with magnification views. These calcifications span 1.1 x 0.8 x 1.0 cm. A definite/discrete mass is not confirmed mammographically. There is a circumscribed oval mass in the 12 o'clock region of the left breast, separate from the palpable area of concern.  No mass, distortion, or suspicious microcalcification is identified in the right breast.  Mammographic images were processed with CAD.  On physical exam, I palpate a very firm approximate 1.5 cm mass at 3 o'clock position left breast 2-3 cm from the nipple.  Ultrasound is performed, showing an  irregular hypoechoic mass with some internal echogenic foci consistent with microcalcifications is seen at 3 o'clock position 2 to 3 cm from the nipple. On ultrasound, the mass measures 1.8 x 1.1 x 1.7 cm. Are some linear areas extension along the margin of the mass, suggesting abnormal ducts.  Multiple cysts are noted in the 12-1 o'clock region of the left breast.  Ultrasound of the left axilla demonstrates a normal left axillary lymph node. No lymphadenopathy is detected.  IMPRESSION: 1. Suspicious mass with associated pleomorphic calcifications in the 3 o'clock position of the left breast corresponds to the palpable mass. Findings are highly suggestive of malignancy. 2. No evidence of malignancy in the right breast.  RECOMMENDATION: Ultrasound-guided core needle biopsy of the palpable left breast mass is recommended. The findings and recommendations were discussed with  the patient and her husband in person today. The patient is able to stay for biopsy, which will be performed today and dictated separately.  I have discussed the findings and recommendations with the patient. Results were also provided in writing at the conclusion of the visit. If applicable, a reminder letter will be sent to the patient regarding the next appointment.  BI-RADS CATEGORY  5: Highly suggestive of malignancy.   Electronically Signed   By: Curlene Dolphin M.D.   On: 09/20/2014 16:10   Mm Digital Diagnostic Unilat L  09/20/2014   CLINICAL DATA:  Biopsy was performed of a palpable mass in the left breast 3 o'clock position.  EXAM: DIAGNOSTIC LEFT MAMMOGRAM POST ULTRASOUND BIOPSY  COMPARISON:  Previous exams  FINDINGS: Mammographic images were obtained following ultrasound guided biopsy of suspicious mass left breast 3 o'clock position. A ribbon shaped biopsy clip is satisfactorily positioned in the 3 o'clock position of the left breast within an area of added density. The pleomorphic calcifications are just deep to the biopsied mass.   IMPRESSION: Satisfactory position of ribbon shaped biopsy clip in the outer left breast.  Final Assessment: Post Procedure Mammograms for Marker Placement   Electronically Signed   By: Curlene Dolphin M.D.   On: 09/20/2014 16:37   US Breast Ltd Uni Left Inc Axilla  09/20/2014   CLINICAL DATA:  38 year old with a palpable mass 3 o'clock position left breast.  EXAM: DIGITAL DIAGNOSTIC  BILATERAL MAMMOGRAM WITH CAD  ULTRASOUND LEFT BREAST  COMPARISON:  None.  ACR Breast Density Category c: The breast tissue is heterogeneously dense, which may obscure small masses.  FINDINGS: A skin BB was placed at the site of palpable concern in the outer left breast. Corresponding with the palpable area of concern, there are suspicious pleomorphic calcifications that were evaluated with magnification views. These calcifications span 1.1 x 0.8 x 1.0 cm. A definite/discrete mass is not confirmed mammographically. There is a circumscribed oval mass in the 12 o'clock region of the left breast, separate from the palpable area of concern.  No mass, distortion, or suspicious microcalcification is identified in the right breast.  Mammographic images were processed with CAD.  On physical exam, I palpate a very firm approximate 1.5 cm mass at 3 o'clock position left breast 2-3 cm from the nipple.  Ultrasound is performed, showing an irregular hypoechoic mass with some internal echogenic foci consistent with microcalcifications is seen at 3 o'clock position 2 to 3 cm from the nipple. On ultrasound, the mass measures 1.8 x 1.1 x 1.7 cm. Are some linear areas extension along the margin of the mass, suggesting abnormal ducts.  Multiple cysts are noted in the 12-1 o'clock region of the left breast.  Ultrasound of the left axilla demonstrates a normal left axillary lymph node. No lymphadenopathy is detected.  IMPRESSION: 1. Suspicious mass with associated pleomorphic calcifications in the 3 o'clock position of the left breast corresponds to the  palpable mass. Findings are highly suggestive of malignancy. 2. No evidence of malignancy in the right breast.  RECOMMENDATION: Ultrasound-guided core needle biopsy of the palpable left breast mass is recommended. The findings and recommendations were discussed with the patient and her husband in person today. The patient is able to stay for biopsy, which will be performed today and dictated separately.  I have discussed the findings and recommendations with the patient. Results were also provided in writing at the conclusion of the visit. If applicable, a reminder letter will be sent to the  patient regarding the next appointment.  BI-RADS CATEGORY  5: Highly suggestive of malignancy.   Electronically Signed   By: Curlene Dolphin M.D.   On: 09/20/2014 16:10   [place] Lt Breast Bx W Loc Dev 1st Lesion Img Bx Spec US Guide  09/30/2014   ADDENDUM REPORT: 09/23/2014 11:21  ADDENDUM: Pathology revealed grade II invasive ductal carcinoma and ductal carcinoma in situ in the left breast. This was found to be concordant by Dr. Leonides Sake. Pathology was discussed with the patient by telephone. She reported doing well after the biopsy. Post biopsy instructions were reviewed and her questions were answered. She has been scheduled at [facility] on October 02, 2014. A bilateral breast MRI will be scheduled. She is encouraged to come to [facility] for educational materials. My number was provided for future questions and concerns.  Pathology results reported by Susa Raring RN, BSN on September 23, 2014.   Electronically Signed   By: Curlene Dolphin M.D.   On: 09/23/2014 11:21   09/30/2014   CLINICAL DATA:  Suspicious palpable mass with associated pleomorphic calcifications 3 o'clock position left breast. Biopsy was recommended.  EXAM: ULTRASOUND GUIDED LEFT BREAST CORE NEEDLE BIOPSY  COMPARISON:  Previous exams.  PROCEDURE: I met with the patient and we discussed the  procedure of ultrasound-guided biopsy, including benefits and alternatives. We discussed the high likelihood of a successful procedure. We discussed the risks of the procedure including infection, bleeding, tissue injury, clip migration, and inadequate sampling. Informed written consent was given. The usual time-out protocol was performed immediately prior to the procedure.  Using sterile technique and 2% Lidocaine as local anesthetic, under direct ultrasound visualization, a 12 gauge vacuum-assisted device was used to perform biopsy of a suspicious palpable mass at 3 o'clock position using a lateral to medial approach. At the conclusion of the procedure, a tissue marker clip was deployed into the biopsy cavity. Follow-up 2-view mammogram was performed and dictated separately.  IMPRESSION: Ultrasound-guided biopsy of left breast mass. No apparent complications.  Electronically Signed: By: Curlene Dolphin M.D. On: 09/20/2014 16:12    ASSESSMENT: 38 y.o. [place] woman s/p Left breast upper outer quadrant biopsy 09/20/2014, for a clinical T1c N0, stage IA invasive ductal carcinoma, grade 2, estrogen and progesterone receptor positive, HER-2 amplified with a signal is ratio of 5.07, and an MIB-1 of 16%  (1) neoadjuvant treatment to consist of carboplatin, docetaxel, trastuzumab and pertuzumab given every 21 days x6, with Neulasta day 2   (2) definitive surgery to follow chemotherapy   (3) trastuzumab to be continued to total one year  (4) radiation to follow surgery if appropriate  (5) antiestrogen is to follow radiation  (6) genetics testing results pending  PLAN: We spent the better part of today's hour-long appointment discussing the biology of breast cancer in general, and the specifics of the patient's tumor in particular. Emily understands that at least part of her tumor is aggressive and HER-2 positive. This requires anti-HER-2 treatment and chemotherapy. Likely another part of her tumor is  less aggressive and ER positive. This will benefit from chemotherapy also of course, but more from antiestrogen therapy. The fact that her tumor is triple positive doesn't mean that her chance of cure is very good and specifically if her risk of recurrence with local treatment only is X, after receiving chemotherapy, anti-HER-2 therapy, and antiestrogens that risk will be cut down to 1/6 of X.  We discussed the  possible toxicities, side effects and complications of treatment including weakening of the heart muscle and permanent menopause, as well as the risk of possibly permanent neuropathy. Leina will have a port placed, have an echocardiogram, and come to "chemotherapy school" before she returns to see me to discuss her antinausea and other supportive medications. We are planning if possible to get her chemotherapy started on 10/21/2014. The plan will be for trastuzumab and pertuzumab carboplatin and docetaxel given every 3 weeks x6 with Neulasta support.   We also discussed the fact that there is no difference in ultimate outcome whether chemotherapy is done first or surgery is done first. In her case, because she needs genetic testing and the results of that may affect her ultimate surgical decision, and because neoadjuvant treatment will make the surgery easier, the plan will be to start with chemoimmunotherapy.  The patient has a good understanding of the overall plan. She agrees with it. She knows the goal of treatment in her case is cure. She will call with any problems that may develop before her next visit here.  Chauncey Cruel, MD   10/02/2014 1:56 PM

## 2014-10-04 ENCOUNTER — Telehealth: Payer: Self-pay | Admitting: Oncology

## 2014-10-04 NOTE — Telephone Encounter (Signed)
per linda no pre auth required for echo. lmonvm for pt re echo after  ched on 10/26. echo appt time @ 12pm @ [facility]. other appts remain the same. also confirmed ched appt.

## 2014-10-07 ENCOUNTER — Other Ambulatory Visit (INDEPENDENT_AMBULATORY_CARE_PROVIDER_SITE_OTHER): Payer: Self-pay | Admitting: General Surgery

## 2014-10-07 ENCOUNTER — Ambulatory Visit (HOSPITAL_COMMUNITY)
Admission: RE | Admit: 2014-10-07 | Discharge: 2014-10-07 | Disposition: A | Discharge status: Home / Self Care | Payer: 59 | Source: Ambulatory Visit | Attending: Oncology | Admitting: Oncology

## 2014-10-07 ENCOUNTER — Other Ambulatory Visit: Payer: 59

## 2014-10-07 DIAGNOSIS — C50919 Malignant neoplasm of unspecified site of unspecified female breast: Secondary | ICD-10-CM | POA: Diagnosis not present

## 2014-10-07 DIAGNOSIS — C50412 Malignant neoplasm of upper-outer quadrant of left female breast: Secondary | ICD-10-CM

## 2014-10-07 NOTE — Progress Notes (Signed)
  Echocardiogram 2D Echocardiogram has been performed.  Nancy Austin 10/07/2014, 1:06 PM

## 2014-10-08 ENCOUNTER — Encounter (HOSPITAL_COMMUNITY): Payer: Self-pay | Admitting: Pharmacy Technician

## 2014-10-09 ENCOUNTER — Encounter: Payer: Self-pay | Admitting: General Practice

## 2014-10-09 NOTE — Patient Instructions (Signed)
KATALEYAH CARDUCCI  10/09/2014   Your procedure is scheduled on:  10/15/2014    Come thru the Emergency Room Entrance   Follow the Signs to [place] at   0530     am  Call this number if you have problems the morning of surgery: [phone]   Remember:   Do not eat food or drink liquids after midnight.   Take these medicines the morning of surgery with A SIP OF WATER: none    Do not wear jewelry, make-up or nail polish.  Do not wear lotions, powders, or perfumes.  deodorant.  Do not shave 48 hours prior to surgery.   Do not bring valuables to the hospital.  Contacts, dentures or bridgework may not be worn into surgery. .     Patients discharged the day of surgery will not be allowed to drive  home.  Name and phone number of your driver:      Please read over the following fact sheets that you were given: , coughing and deep breathing exercises, leg exercises            [facility] - Preparing for Surgery Before surgery, you can play an important role.  Because skin is not sterile, your skin needs to be as free of germs as possible.  You can reduce the number of germs on your skin by washing with CHG (chlorahexidine gluconate) soap before surgery.  CHG is an antiseptic cleaner which kills germs and bonds with the skin to continue killing germs even after washing. Please DO NOT use if you have an allergy to CHG or antibacterial soaps.  If your skin becomes reddened/irritated stop using the CHG and inform your nurse when you arrive at Short Stay. Do not shave (including legs and underarms) for at least 48 hours prior to the first CHG shower.  You may shave your face/neck. Please follow these instructions carefully:  1.  Shower with CHG Soap the night before surgery and the  morning of Surgery.  2.  If you choose to wash your hair, wash your hair first as usual with your  normal  shampoo.  3.  After you shampoo, rinse your hair and body thoroughly to remove the  shampoo.                            4.  Use CHG as you would any other liquid soap.  You can apply chg directly  to the skin and wash                       Gently with a scrungie or clean washcloth.  5.  Apply the CHG Soap to your body ONLY FROM THE NECK DOWN.   Do not use on face/ open                           Wound or open sores. Avoid contact with eyes, ears mouth and genitals (private parts).                       Wash face,  Genitals (private parts) with your normal soap.             6.  Wash thoroughly, paying special attention to the area where your surgery  will be performed.  7.  Thoroughly rinse your body with warm water from  the neck down.  8.  DO NOT shower/wash with your normal soap after using and rinsing off  the CHG Soap.                9.  Pat yourself dry with a clean towel.            10.  Wear clean pajamas.            11.  Place clean sheets on your bed the night of your first shower and do not  sleep with pets. Day of Surgery : Do not apply any lotions/deodorants the morning of surgery.  Please wear clean clothes to the hospital/surgery center.  FAILURE TO FOLLOW THESE INSTRUCTIONS MAY RESULT IN THE CANCELLATION OF YOUR SURGERY PATIENT SIGNATURE_________________________________  NURSE SIGNATURE__________________________________  ________________________________________________________________________

## 2014-10-09 NOTE — Progress Notes (Signed)
[place] Psychosocial Distress Screening Spiritual Care  Visited with Nancy Austin, husband Nancy Austin, Nancy Austin at breast clinic, introducing [place] team/resoureces and reviewing distress screen per protocol.  The patient scored a 7 on the Psychosocial Distress Thermometer which indicates severe distress. Also assessed for distress and other psychosocial needs.   ONCBCN DISTRESS SCREENING 10/09/2014  Screening Type Initial Screening  [name] the number that describes how much distress you have been experiencing in the past week 7  Emotional problem type Nervousness/Anxiety;Adjusting to illness  Information Concerns Type Lack of info about treatment  Physical Problem type Sleep/insomnia;Constipation/diarrhea  Referral to clinical social work Yes  Referral to support programs Yes  Other [place] and I realized that we had met years ago in a children's playgroup, which facilitated our addressing pt/family's concern about how to talk with children (5, 52) about dx/tx.  [name] appeared on the verge of tears throughout the visit, as other providers noted in clinic, as well.  Provided spiritual and emotional support, normalizing deep and complex feelings, and making space for pt/family to share about stress level and support system.  Per pt, she is particularly struggling with lack of sleep due to distress.  Pt/family appreciative of visit/resources.  Follow up needed: No.  Pt and family aware of ongoing chaplain/support team availability, but please also page as needs arise:  [phone].  Thank you.  [place], [place]

## 2014-10-09 NOTE — Progress Notes (Signed)
Visited with Nancy Austin and her mom in chemo class after meeting them and Nancy Austin's husband Nancy Austin in breast clinic (and realizing that Nancy Austin and I have a community connection, as well).  Continuing to provide pastoral presence and gentle support, noting that Nancy Austin appears scared and trying to keep strong feelings from coming to the surface.  She and her mom were processing Hazley's friends' advice about how to talk about dx/tx with her children (ages 63, 75).  Pt/family aware of ongoing chaplain availability, but please also page as needs arise:  [phone].  Thank you.  [place], [place]

## 2014-10-10 ENCOUNTER — Other Ambulatory Visit: Payer: 59

## 2014-10-10 ENCOUNTER — Encounter: Payer: Self-pay | Admitting: Genetic Counselor

## 2014-10-10 ENCOUNTER — Ambulatory Visit (HOSPITAL_BASED_OUTPATIENT_CLINIC_OR_DEPARTMENT_OTHER): Payer: 59 | Admitting: Genetic Counselor

## 2014-10-10 ENCOUNTER — Encounter (HOSPITAL_COMMUNITY): Payer: Self-pay

## 2014-10-10 ENCOUNTER — Encounter (HOSPITAL_COMMUNITY)
Admission: RE | Admit: 2014-10-10 | Discharge: 2014-10-10 | Disposition: A | Discharge status: Home / Self Care | Payer: 59 | Source: Ambulatory Visit | Attending: General Surgery | Admitting: General Surgery

## 2014-10-10 DIAGNOSIS — Z315 Encounter for genetic counseling: Secondary | ICD-10-CM

## 2014-10-10 DIAGNOSIS — C50412 Malignant neoplasm of upper-outer quadrant of left female breast: Secondary | ICD-10-CM

## 2014-10-10 DIAGNOSIS — Z01812 Encounter for preprocedural laboratory examination: Secondary | ICD-10-CM | POA: Insufficient documentation

## 2014-10-10 DIAGNOSIS — Z808 Family history of malignant neoplasm of other organs or systems: Secondary | ICD-10-CM

## 2014-10-10 DIAGNOSIS — Z807 Family history of other malignant neoplasms of lymphoid, hematopoietic and related tissues: Secondary | ICD-10-CM

## 2014-10-10 LAB — PREGNANCY, URINE: PREG TEST UR: NEGATIVE

## 2014-10-10 NOTE — Progress Notes (Signed)
CBC/DIFF/CMP done 10/02/14 in [facility]  [facility] 10/02/14 EPIC

## 2014-10-10 NOTE — Progress Notes (Signed)
Patient Name: Nancy Austin Patient Age: 38 y.o. Encounter Date: 10/10/2014  Referring Physician: Lurline Del, MD  Primary Care Provider: Logan Bores, MD   Nancy Austin, a 39 y.o. female, is being seen at the [facility] due to a personal history of breast cancer. She presents to clinic today to discuss the possibility of a hereditary predisposition to cancer and discuss whether genetic testing is warranted.  HISTORY OF PRESENT ILLNESS: Nancy Austin was diagnosed with left breast cancer (IDC) recently at the age of 28. The plan is to start with neoadjuvant chemotherapy and then decide which surgery to have based on results of testing.  The breast tumor was ER positive, PR positive, and HER2 positive.  Nancy Austin has yearly gynecologic exams.  Past Medical History  Diagnosis Date  . Breast cancer of upper-outer quadrant of left female breast 09/24/2014    Past Surgical History  Procedure Laterality Date  . Tonsillectomy  1996    History   Social History  . Marital Status: Married    Spouse Name: N/A    Number of Children: N/A  . Years of Education: N/A   Social History Main Topics  . Smoking status: Never Smoker   . Smokeless tobacco: Never Used  . Alcohol Use: 1.8 oz/week    3 Glasses of wine per week  . Drug Use: No  . Sexual Activity: Not on file   Other Topics Concern  . Not on file   Social History Narrative  . No narrative on file     FAMILY HISTORY:   During the visit, a 4-generation pedigree was obtained. Family tree will be sent for scanning and will be in EPIC under the Media tab.  Significant diagnoses include the following:  Family History  Problem Relation Age of Onset  . Skin cancer Father   . Prostate cancer Maternal Uncle 52    currently 71  . Multiple myeloma Paternal Grandmother 44    Deceased 46    Additionally, Nancy Austin has a son (age 68) and a daughter (age 90). Her brother (age 78) is cancer-free. She has no  paternal aunts. Her paternal uncle (age 55) has 3 sons.  Nancy Austin ancestry is Caucasian. There is no known Jewish ancestry and no consanguinity.  ASSESSMENT AND PLAN: Nancy Austin is a 38 y.o. female with a personal history of breast cancer. While she was diagnosed at a young age, her family history is not suggestive of a hereditary predisposition to cancer, but we discussed that the paucity of women in her father's family should be taken into accout. Due to her young age and lack of women in her father's family, genetic testing is indicated.  A negative test result will be reassuring. We reviewed the characteristics, features and inheritance patterns of hereditary cancer syndromes. We also discussed genetic testing, including the process of testing, insurance coverage and implications of results.   Nancy Austin wished to pursue genetic testing and a blood sample will be sent to [facility] for analysis of the 17 genes on the BreastNext gene panel. We discussed the implications of a positive, negative and/ or Variant of Uncertain Significance (VUS) result. Results should be available in approximately 4-5 weeks, at which point we will contact her and address implications for her as well as address genetic testing for at-risk family members, if needed.    We encouraged Nancy Austin to remain in contact with Cancer Genetics annually so that we can update the  family history and inform her of any changes in cancer genetics and testing that may be of benefit for this family. Ms.  Alguire questions were answered to her satisfaction today.   Thank you for the referral and allowing [place] to share in the care of your patient.   The patient was seen for a total of 30 minutes, greater than 50% of which was spent face-to-face counseling. This patient was discussed with the overseeing provider who agrees with the above.   Steele Berg, MS, [place] Certified Genetic Counselor phone: [phone] [email or web address]

## 2014-10-14 NOTE — Anesthesia Preprocedure Evaluation (Addendum)
Anesthesia Evaluation  Patient identified by MRN, date of birth, ID band Patient awake    Reviewed: Allergy & Precautions, H&P , NPO status , Patient's Chart, lab work & pertinent test results  History of Anesthesia Complications (+) PONV and history of anesthetic complications  Airway Mallampati: II  TM Distance: >3 FB Neck ROM: Full    Dental no notable dental hx. (+) Teeth Intact, Dental Advisory Given   Pulmonary neg pulmonary ROS,  breath sounds clear to auscultation  Pulmonary exam normal       Cardiovascular negative cardio ROS  Rhythm:Regular Rate:Normal     Neuro/Psych negative neurological ROS  negative psych ROS   GI/Hepatic negative GI ROS, Neg liver ROS,   Endo/Other  negative endocrine ROS  Renal/GU negative Renal ROS  negative genitourinary   Musculoskeletal negative musculoskeletal ROS (+)   Abdominal   Peds negative pediatric ROS (+)  Hematology negative hematology ROS (+)   Anesthesia Other Findings Breast cancer  Reproductive/Obstetrics negative OB ROS                           Anesthesia Physical Anesthesia Plan  ASA: II  Anesthesia Plan: General   Post-op Pain Management:    Induction: Intravenous  Airway Management Planned: LMA  Additional Equipment:   Intra-op Plan:   Post-operative Plan: Extubation in OR  Informed Consent: I have reviewed the patients History and Physical, chart, labs and discussed the procedure including the risks, benefits and alternatives for the proposed anesthesia with the patient or authorized representative who has indicated his/her understanding and acceptance.   Dental advisory given  Plan Discussed with: CRNA  Anesthesia Plan Comments:         Anesthesia Quick Evaluation

## 2014-10-15 ENCOUNTER — Ambulatory Visit (HOSPITAL_COMMUNITY): Payer: 59

## 2014-10-15 ENCOUNTER — Ambulatory Visit (HOSPITAL_COMMUNITY): Payer: 59 | Admitting: Anesthesiology

## 2014-10-15 ENCOUNTER — Encounter (HOSPITAL_COMMUNITY)
Admission: RE | Disposition: A | Discharge status: Home / Self Care | Payer: Self-pay | Source: Ambulatory Visit | Attending: General Surgery

## 2014-10-15 ENCOUNTER — Encounter (HOSPITAL_COMMUNITY): Payer: Self-pay | Admitting: *Deleted

## 2014-10-15 ENCOUNTER — Ambulatory Visit (HOSPITAL_COMMUNITY)
Admission: RE | Admit: 2014-10-15 | Discharge: 2014-10-15 | Disposition: A | Discharge status: Home / Self Care | Payer: 59 | Source: Ambulatory Visit | Attending: General Surgery | Admitting: General Surgery

## 2014-10-15 DIAGNOSIS — C50912 Malignant neoplasm of unspecified site of left female breast: Secondary | ICD-10-CM | POA: Diagnosis present

## 2014-10-15 DIAGNOSIS — Z170 Estrogen receptor positive status [ER+]: Secondary | ICD-10-CM | POA: Insufficient documentation

## 2014-10-15 DIAGNOSIS — C50412 Malignant neoplasm of upper-outer quadrant of left female breast: Secondary | ICD-10-CM | POA: Insufficient documentation

## 2014-10-15 DIAGNOSIS — Z87891 Personal history of nicotine dependence: Secondary | ICD-10-CM | POA: Insufficient documentation

## 2014-10-15 DIAGNOSIS — Z95828 Presence of other vascular implants and grafts: Secondary | ICD-10-CM

## 2014-10-15 HISTORY — PX: PORTACATH PLACEMENT: SHX2246

## 2014-10-15 SURGERY — INSERTION, TUNNELED CENTRAL VENOUS DEVICE, WITH PORT
Anesthesia: General

## 2014-10-15 MED ORDER — LIDOCAINE HCL (CARDIAC) 20 MG/ML IV SOLN
INTRAVENOUS | Status: AC
  Filled 2014-10-15: qty 5

## 2014-10-15 MED ORDER — MIDAZOLAM HCL 5 MG/5ML IJ SOLN
INTRAMUSCULAR | Status: DC | PRN
  Administered 2014-10-15: 2 mg via INTRAVENOUS

## 2014-10-15 MED ORDER — ONDANSETRON HCL 4 MG/2ML IJ SOLN
INTRAMUSCULAR | Status: DC | PRN
  Administered 2014-10-15: 4 mg via INTRAVENOUS

## 2014-10-15 MED ORDER — PROPOFOL 10 MG/ML IV BOLUS
INTRAVENOUS | Status: DC | PRN
  Administered 2014-10-15: 190 mg via INTRAVENOUS

## 2014-10-15 MED ORDER — HEPARIN SOD (PORK) LOCK FLUSH 100 UNIT/ML IV SOLN
INTRAVENOUS | Status: DC | PRN
  Administered 2014-10-15: 500 [IU] via INTRAVENOUS

## 2014-10-15 MED ORDER — ONDANSETRON HCL 4 MG/2ML IJ SOLN
4.0000 mg | Freq: Once | INTRAMUSCULAR | Status: DC | PRN
Start: 2014-10-15 — End: 2014-10-15

## 2014-10-15 MED ORDER — BUPIVACAINE HCL (PF) 0.25 % IJ SOLN
INTRAMUSCULAR | Status: AC
  Filled 2014-10-15: qty 30

## 2014-10-15 MED ORDER — SODIUM CHLORIDE 0.9 % IR SOLN
Status: DC | PRN
  Administered 2014-10-15: 1000 mL

## 2014-10-15 MED ORDER — FENTANYL CITRATE 0.05 MG/ML IJ SOLN
INTRAMUSCULAR | Status: DC | PRN
  Administered 2014-10-15: 50 ug via INTRAVENOUS

## 2014-10-15 MED ORDER — EPHEDRINE SULFATE 50 MG/ML IJ SOLN
INTRAMUSCULAR | Status: DC | PRN
  Administered 2014-10-15: 5 mg via INTRAVENOUS

## 2014-10-15 MED ORDER — SODIUM CHLORIDE 0.9 % IJ SOLN: 3.0000 mL | Freq: Two times a day (BID) | INTRAMUSCULAR | Status: DC

## 2014-10-15 MED ORDER — ACETAMINOPHEN 325 MG PO TABS: 650.0000 mg | ORAL_TABLET | ORAL | Status: DC | PRN

## 2014-10-15 MED ORDER — CEFAZOLIN SODIUM-DEXTROSE 2-3 GM-% IV SOLR
INTRAVENOUS | Status: AC
  Filled 2014-10-15: qty 50

## 2014-10-15 MED ORDER — OXYCODONE HCL 5 MG PO TABS: 5.0000 mg | ORAL_TABLET | ORAL | Status: DC | PRN

## 2014-10-15 MED ORDER — HYDROCODONE-ACETAMINOPHEN 10-325 MG PO TABS: 1.0000 | ORAL_TABLET | Freq: Four times a day (QID) | ORAL | Status: DC | PRN

## 2014-10-15 MED ORDER — BUPIVACAINE HCL (PF) 0.25 % IJ SOLN
INTRAMUSCULAR | Status: DC | PRN
  Administered 2014-10-15: 15 mL

## 2014-10-15 MED ORDER — MIDAZOLAM HCL 2 MG/2ML IJ SOLN
INTRAMUSCULAR | Status: AC
  Filled 2014-10-15: qty 2

## 2014-10-15 MED ORDER — PROPOFOL 10 MG/ML IV BOLUS
INTRAVENOUS | Status: AC
Start: 2014-10-15 — End: 2014-10-15
  Filled 2014-10-15: qty 20

## 2014-10-15 MED ORDER — SODIUM CHLORIDE 0.9 % IJ SOLN: 3.0000 mL | INTRAMUSCULAR | Status: DC | PRN

## 2014-10-15 MED ORDER — ONDANSETRON HCL 4 MG/2ML IJ SOLN
INTRAMUSCULAR | Status: AC
  Filled 2014-10-15: qty 2

## 2014-10-15 MED ORDER — LIDOCAINE HCL (CARDIAC) 20 MG/ML IV SOLN
INTRAVENOUS | Status: DC | PRN
  Administered 2014-10-15: 6 mg via INTRAVENOUS

## 2014-10-15 MED ORDER — FENTANYL CITRATE 0.05 MG/ML IJ SOLN
INTRAMUSCULAR | Status: AC
  Filled 2014-10-15: qty 5

## 2014-10-15 MED ORDER — ACETAMINOPHEN 650 MG RE SUPP
650.0000 mg | RECTAL | Status: DC | PRN
  Filled 2014-10-15: qty 1

## 2014-10-15 MED ORDER — FENTANYL CITRATE 0.05 MG/ML IJ SOLN: 25.0000 ug | INTRAMUSCULAR | Status: DC | PRN

## 2014-10-15 MED ORDER — DEXAMETHASONE SODIUM PHOSPHATE 10 MG/ML IJ SOLN
INTRAMUSCULAR | Status: DC | PRN
  Administered 2014-10-15: 10 mg via INTRAVENOUS

## 2014-10-15 MED ORDER — LACTATED RINGERS IV SOLN
INTRAVENOUS | Status: DC | PRN
  Administered 2014-10-15: 07:00:00 via INTRAVENOUS

## 2014-10-15 MED ORDER — SODIUM CHLORIDE 0.9 % IV SOLN
INTRAVENOUS | Status: DC
Start: 2014-10-15 — End: 2014-10-15

## 2014-10-15 MED ORDER — SODIUM CHLORIDE 0.9 % IR SOLN
Freq: Once | Status: AC
  Administered 2014-10-15: 08:00:00
  Filled 2014-10-15: qty 1.2

## 2014-10-15 MED ORDER — HEPARIN SOD (PORK) LOCK FLUSH 100 UNIT/ML IV SOLN
INTRAVENOUS | Status: AC
  Filled 2014-10-15: qty 5

## 2014-10-15 MED ORDER — CEFAZOLIN SODIUM-DEXTROSE 2-3 GM-% IV SOLR
2.0000 g | INTRAVENOUS | Status: AC
  Administered 2014-10-15: 2 g via INTRAVENOUS

## 2014-10-15 MED ORDER — SODIUM CHLORIDE 0.9 % IV SOLN
250.0000 mL | INTRAVENOUS | Status: DC | PRN
Start: 2014-10-15 — End: 2014-10-15

## 2014-10-15 MED ORDER — GLYCOPYRROLATE 0.2 MG/ML IJ SOLN
INTRAMUSCULAR | Status: DC | PRN
  Administered 2014-10-15: 0.2 mg via INTRAVENOUS

## 2014-10-15 SURGICAL SUPPLY — 43 items
BAG DECANTER FOR FLEXI CONT (MISCELLANEOUS) ×3 IMPLANT
BENZOIN TINCTURE PRP APPL 2/3 (GAUZE/BANDAGES/DRESSINGS) IMPLANT
BLADE HEX COATED 2.75 (ELECTRODE) ×3 IMPLANT
BLADE SURG 15 STRL LF DISP TIS (BLADE) ×1 IMPLANT
BLADE SURG 15 STRL SS (BLADE) ×2
BLADE SURG SZ11 CARB STEEL (BLADE) ×3 IMPLANT
CLOSURE WOUND 1/2 X4 (GAUZE/BANDAGES/DRESSINGS)
DECANTER SPIKE VIAL GLASS SM (MISCELLANEOUS) ×3 IMPLANT
DRAPE C-ARM 42X120 X-RAY (DRAPES) ×3 IMPLANT
DRAPE LAPAROSCOPIC ABDOMINAL (DRAPES) ×3 IMPLANT
DRSG TEGADERM 2-3/8X2-3/4 SM (GAUZE/BANDAGES/DRESSINGS) IMPLANT
DRSG TEGADERM 4X4.75 (GAUZE/BANDAGES/DRESSINGS) IMPLANT
ELECT REM PT RETURN 9FT ADLT (ELECTROSURGICAL) ×3
ELECTRODE REM PT RTRN 9FT ADLT (ELECTROSURGICAL) ×1 IMPLANT
GAUZE SPONGE 4X4 12PLY STRL (GAUZE/BANDAGES/DRESSINGS) IMPLANT
GAUZE SPONGE 4X4 16PLY XRAY LF (GAUZE/BANDAGES/DRESSINGS) ×3 IMPLANT
GLOVE BIO SURGEON STRL SZ7 (GLOVE) ×9 IMPLANT
GLOVE BIOGEL PI IND STRL 7.0 (GLOVE) ×1 IMPLANT
GLOVE BIOGEL PI IND STRL 7.5 (GLOVE) ×3 IMPLANT
GLOVE BIOGEL PI INDICATOR 7.0 (GLOVE) ×2
GLOVE BIOGEL PI INDICATOR 7.5 (GLOVE) ×6
GOWN STRL REUS W/ TWL XL LVL3 (GOWN DISPOSABLE) IMPLANT
GOWN STRL REUS W/TWL LRG LVL3 (GOWN DISPOSABLE) ×6 IMPLANT
GOWN STRL REUS W/TWL XL LVL3 (GOWN DISPOSABLE) IMPLANT
KIT BASIN OR (CUSTOM PROCEDURE TRAY) ×3 IMPLANT
KIT PORT POWER 8FR ISP CVUE (Catheter) ×3 IMPLANT
KIT PORT POWER ISP 8FR (Catheter) IMPLANT
KIT POWER CATH 8FR (Catheter) IMPLANT
LIQUID BAND (GAUZE/BANDAGES/DRESSINGS) ×3 IMPLANT
NEEDLE HYPO 25X1 1.5 SAFETY (NEEDLE) ×3 IMPLANT
NS IRRIG 1000ML POUR BTL (IV SOLUTION) ×3 IMPLANT
PACK BASIC VI WITH GOWN DISP (CUSTOM PROCEDURE TRAY) ×3 IMPLANT
PENCIL BUTTON HOLSTER BLD 10FT (ELECTRODE) ×3 IMPLANT
STRIP CLOSURE SKIN 1/2X4 (GAUZE/BANDAGES/DRESSINGS) IMPLANT
SUT MNCRL AB 4-0 PS2 18 (SUTURE) ×3 IMPLANT
SUT PROLENE 2 0 SH DA (SUTURE) ×3 IMPLANT
SUT SILK 2 0 (SUTURE)
SUT SILK 2-0 30XBRD TIE 12 (SUTURE) IMPLANT
SUT VIC AB 3-0 SH 27 (SUTURE) ×2
SUT VIC AB 3-0 SH 27XBRD (SUTURE) ×1 IMPLANT
SYR CONTROL 10ML LL (SYRINGE) ×3 IMPLANT
SYRINGE 10CC LL (SYRINGE) ×3 IMPLANT
TOWEL OR 17X26 10 PK STRL BLUE (TOWEL DISPOSABLE) ×3 IMPLANT

## 2014-10-15 NOTE — Progress Notes (Signed)
Portable upright chest x-ray done. 

## 2014-10-15 NOTE — Interval H&P Note (Signed)
History and Physical Interval Note:  10/15/2014 7:38 AM  Nancy Austin  has presented today for surgery, with the diagnosis of breast cancer  The various methods of treatment have been discussed with the patient and family. After consideration of risks, benefits and other options for treatment, the patient has consented to  Procedure(s): INSERTION PORT-A-CATH (N/A) as a surgical intervention .  The patient's history has been reviewed, patient examined, no change in status, stable for surgery.  I have reviewed the patient's chart and labs.  Questions were answered to the patient's satisfaction.     [name]

## 2014-10-15 NOTE — Anesthesia Postprocedure Evaluation (Signed)
  Anesthesia Post-op Note  Patient: Nancy Austin  Procedure(s) Performed: Procedure(s) (LRB): INSERTION PORT-A-CATH (N/A)  Patient Location: PACU  Anesthesia Type: General  Level of Consciousness: awake and alert   Airway and Oxygen Therapy: Patient Spontanous Breathing  Post-op Pain: mild  Post-op Assessment: Post-op Vital signs reviewed, Patient's Cardiovascular Status Stable, Respiratory Function Stable, Patent Airway and No signs of Nausea or vomiting  Last Vitals:  Filed Vitals:   10/15/14 0930  BP:   Pulse: 59  Temp:   Resp: 17    Post-op Vital Signs: stable   Complications: No apparent anesthesia complications

## 2014-10-15 NOTE — H&P (Signed)
The patient is a 38 year old female who presents with breast cancer. 38 yof who noted a left breast mass about 3 weeks ago. No pain, no change over this time, no nipple discharge. she has undergone mm that shows left breast pleomorphic calcifications measuring 1.1x0.8x1 cm. Breast density is C. US shows a 1.8x1.1x1.7 cm. MRI shows a 1.7x1.7x1.6 cm mass in upper outer left breast with no abnormal nodes or any suspicious findings in the right breast. She has no family history. she is here with her mom and husband Rolla Plate today.   Other Problems Marlowe Kays Goettsch; 10/02/2014 8:54 AM) Lump In Breast  Past Surgical History Mirian Mo; 10/02/2014 8:54 AM) Tonsillectomy  Diagnostic Studies History Mirian Mo; 10/02/2014 8:54 AM) Colonoscopy never Mammogram within last year  Social History Mirian Mo; 10/02/2014 8:54 AM) Caffeine use Coffee. No drug use Tobacco use Former smoker.  Family History Mirian Mo; 10/02/2014 8:54 AM) First Degree Relatives No pertinent family history  Pregnancy / Birth History Mirian Mo; 10/02/2014 8:54 AM) Age at menarche 53 years. Contraceptive History Oral contraceptives. Gravida 2 Maternal age 62-30 Para 2 Regular periods  Review of Systems Mirian Mo; 10/02/2014 8:54 AM) General Not Present- Appetite Loss, Chills, Fatigue, Fever, Night Sweats, Weight Gain and Weight Loss. Skin Not Present- Change in Wart/Mole, Dryness, Hives, Jaundice, New Lesions, Non-Healing Wounds, Rash and Ulcer. HEENT Not Present- Earache, Hearing Loss, Hoarseness, Nose Bleed, Oral Ulcers, Ringing in the Ears, Seasonal Allergies, Sinus Pain, Sore Throat, Visual Disturbances, Wears glasses/contact lenses and Yellow Eyes. Respiratory Not Present- Bloody sputum, Chronic Cough, Difficulty Breathing, Snoring and Wheezing. Breast Not Present- Breast Mass, Breast Pain, Nipple Discharge and Skin Changes. Cardiovascular Not Present-  Chest Pain, Difficulty Breathing Lying Down, Leg Cramps, Palpitations, Rapid Heart Rate, Shortness of Breath and Swelling of Extremities. Gastrointestinal Not Present- Abdominal Pain, Bloating, Bloody Stool, Change in Bowel Habits, Chronic diarrhea, Constipation, Difficulty Swallowing, Excessive gas, Gets full quickly at meals, Hemorrhoids, Indigestion, Nausea, Rectal Pain and Vomiting. Female Genitourinary Not Present- Frequency, Nocturia, Painful Urination, Pelvic Pain and Urgency. Musculoskeletal Not Present- Back Pain, Joint Pain, Joint Stiffness, Muscle Pain, Muscle Weakness and Swelling of Extremities. Neurological Not Present- Decreased Memory, Fainting, Headaches, Numbness, Seizures, Tingling, Tremor, Trouble walking and Weakness. Psychiatric Not Present- Anxiety, Bipolar, Change in Sleep Pattern, Depression, Fearful and Frequent crying. Endocrine Not Present- Cold Intolerance, Excessive Hunger, Hair Changes, Heat Intolerance, Hot flashes and New Diabetes. Hematology Not Present- Easy Bruising, Excessive bleeding, Gland problems, HIV and Persistent Infections.   Physical Exam Rolm Bookbinder MD; 10/02/2014 3:15 PM) General Mental Status-Alert. Orientation-Oriented X3.  Chest and Lung Exam Chest and lung exam reveals -normal excursion with symmetric chest walls, quiet, even and easy respiratory effort with no use of accessory muscles and on auscultation, normal breath sounds, no adventitious sounds and normal vocal resonance.  Breast Nipples Discharge - Bilateral - None. Breast - Right-Normal.   Cardiovascular Cardiovascular examination reveals -on palpation PMI is normal in location and amplitude, no palpable S3 or S4. Normal cardiac borders. and normal heart sounds, regular rate and rhythm with no murmurs.    Assessment & Plan Rolm Bookbinder MD; 10/02/2014 3:31 PM) CARCINOMA OF UPPER-OUTER QUADRANT OF FEMALE BREAST, LEFT (174.4  C50.412) Impression: We  discussed the staging and pathophysiology of breast cancer. We discussed all of the different options for treatment for breast cancer including surgery, chemotherapy, radiation therapy, Herceptin, and antiestrogen therapy. I think that primary chemotherapy with dual antiher2 therapy is reasonable as first treatment. Will allow genetics  to come back and possibly shrink this to be more amenable to lumpectomy if she chooses. We also discussed mastectomy, role of prophylactic mastectomy and reconstruction. We discussed a sentinel lymph node biopsy as she does not appear to have lymph node involvement at the time of surgery. We discussed the options for treatment of the breast cancer which included lumpectomy versus a mastectomy. We also discussed that she will need radiation therapy if she undergoes lumpectomy. We discussed the mastectomy and the postoperative care for that as well. We discussed that there is no difference in her survival whether she undergoes lumpectomy with radiation therapy versus a mastectomy.

## 2014-10-15 NOTE — Op Note (Signed)
Preoperative diagnosis: Clinical stage I left breast cancer, her2 positive Postoperative diagnosis: same as above Procedure: Right subclavian powerport insertion Surgeon: Dr Serita Grammes OVZ:CHYIFOY Anesthesia: general Drains: none Specimens: none Complications: none Disposition to recovery room stable Sponge and needle count correct  Indications: This is a 31 yof with a newly diagnosed her2 positive left breast cancer who has been seen in our multidisciplinary clinic and we have decided to proceed with primary systemic therapy.  Procedure: After informed consent was obtained the patient was taken to the operating room. Cefazolin was given. SCDs were in place. She was then placed under general anesthesia without complication. She was prepped and draped in the standard sterile surgical fashion. A surgical timeout was performed.  I infiltrated marcaine in the right chest and clavicle. I then was able to access the subclavian vein. I then made a pocket below this and tunneled the line. I then dilated the tract. I then inserted the sheath. I then tunneled the line between the two sites. I then inserted the dilator and peel away sheath. The line was then inserted and the sheath removed. This was then pulled back to be in the distal cava. I then attached to the port. I secured this in 2 positions with 2-0 prolene. This flushed easily and aspirated blood. I placed heparin in the port. I then viewed this with fluoro and confirmed entire line to be in good position. I then closed this with 3-0 vicryl and 4-0 monocryl. Dermabond was placed. She tolerated this well and was transferred to pacu stable.

## 2014-10-15 NOTE — Discharge Instructions (Signed)
    PORT-A-CATH: POST OP INSTRUCTIONS  Always review your discharge instruction sheet given to you by the facility where your surgery was performed.   1. A prescription for pain medication may be given to you upon discharge. Take your pain medication as prescribed, if needed. If narcotic pain medicine is not needed, then you make take acetaminophen (Tylenol) or ibuprofen (Advil) as needed.  2. Take your usually prescribed medications unless otherwise directed. 3. If you need a refill on your pain medication, please contact our office. All narcotic pain medicine now requires a paper prescription.  Phoned in and fax refills are no longer allowed by law.  Prescriptions will not be filled after 5 pm or on weekends.  4. You should follow a light diet for the remainder of the day after your procedure. 5. Most patients will experience some mild swelling and/or bruising in the area of the incision. It may take several days to resolve. 6. It is common to experience some constipation if taking pain medication after surgery. Increasing fluid intake and taking a stool softener (such as Colace) will usually help or prevent this problem from occurring. A mild laxative (Milk of Magnesia or Miralax) should be taken according to package directions if there are no bowel movements after 48 hours.  7. Unless discharge instructions indicate otherwise, you may remove your bandages 48 hours after surgery, and you may shower at that time. You may have steri-strips (small white skin tapes) in place directly over the incision.  These strips should be left on the skin for 7-10 days.  If your surgeon used Dermabond (skin glue) on the incision, you may shower in 24 hours.  The glue will flake off over the next 2-3 weeks.  8. If your port is left accessed at the end of surgery (needle left in port), the dressing cannot get wet and should only by changed by a healthcare professional. When the port is no longer accessed (when the  needle has been removed), follow step 7.   9. ACTIVITIES:  Limit activity involving your arms for the next 72 hours. Do no strenuous exercise or activity for 1 week. You may drive when you are no longer taking prescription pain medication, you can comfortably wear a seatbelt, and you can maneuver your car. 10.You may need to see your doctor in the office for a follow-up appointment.  Please       check with your doctor.  11.When you receive a new Port-a-Cath, you will get a product guide and        ID card.  Please keep them in case you need them.  WHEN TO CALL YOUR DOCTOR ([phone]): 1. Fever over 101.0 2. Chills 3. Continued bleeding from incision 4. Increased redness and tenderness at the site 5. Shortness of breath, difficulty breathing   The clinic staff is available to answer your questions during regular business hours. Please don't hesitate to call and ask to speak to one of the nurses or medical assistants for clinical concerns. If you have a medical emergency, go to the nearest emergency room or call 911.  A surgeon from [facility] is always on call at the hospital.     For further information, please visit [email or web address]      

## 2014-10-15 NOTE — Progress Notes (Signed)
X-ray results noted 

## 2014-10-15 NOTE — Transfer of Care (Signed)
Immediate Anesthesia Transfer of Care Note  Patient: Nancy Austin  Procedure(s) Performed: Procedure(s): INSERTION PORT-A-CATH (N/A)  Patient Location: PACU  Anesthesia Type:General  Level of Consciousness: awake, alert  and patient cooperative  Airway & Oxygen Therapy: Patient Spontanous Breathing and Patient connected to face mask oxygen  Post-op Assessment: Report given to PACU RN, Post -op Vital signs reviewed and stable and Patient moving all extremities  Post vital signs: Reviewed and stable  Complications: No apparent anesthesia complications

## 2014-10-16 ENCOUNTER — Encounter (HOSPITAL_COMMUNITY): Payer: Self-pay | Admitting: General Surgery

## 2014-10-16 ENCOUNTER — Ambulatory Visit (HOSPITAL_BASED_OUTPATIENT_CLINIC_OR_DEPARTMENT_OTHER): Payer: 59 | Admitting: Oncology

## 2014-10-16 DIAGNOSIS — Z170 Estrogen receptor positive status [ER+]: Secondary | ICD-10-CM

## 2014-10-16 DIAGNOSIS — C50412 Malignant neoplasm of upper-outer quadrant of left female breast: Secondary | ICD-10-CM

## 2014-10-16 MED ORDER — ONDANSETRON HCL 8 MG PO TABS: 8.0000 mg | ORAL_TABLET | Freq: Two times a day (BID) | ORAL | Status: DC

## 2014-10-16 MED ORDER — TOBRAMYCIN-DEXAMETHASONE 0.3-0.1 % OP SUSP: 1.0000 [drp] | Freq: Two times a day (BID) | OPHTHALMIC | Status: DC

## 2014-10-16 MED ORDER — DEXAMETHASONE 4 MG PO TABS: 8.0000 mg | ORAL_TABLET | Freq: Two times a day (BID) | ORAL | Status: DC

## 2014-10-16 MED ORDER — PROCHLORPERAZINE MALEATE 10 MG PO TABS: 10.0000 mg | ORAL_TABLET | Freq: Four times a day (QID) | ORAL | Status: DC | PRN

## 2014-10-16 MED ORDER — LIDOCAINE-PRILOCAINE 2.5-2.5 % EX CREA: 1.0000 "application " | TOPICAL_CREAM | CUTANEOUS | Status: DC | PRN

## 2014-10-16 MED ORDER — LORAZEPAM 0.5 MG PO TABS: 0.5000 mg | ORAL_TABLET | Freq: Four times a day (QID) | ORAL | Status: DC | PRN

## 2014-10-16 NOTE — Progress Notes (Signed)
Nancy Austin  [phone] Fax:(336) [phone]     ID: Nancy Austin DOB: December 20, 1975  MR#: [id number]  VZC#:[id number]  Patient Care Team: Nancy Bores, MD as PCP - General (Obstetrics and Gynecology) Nancy Bookbinder, MD as Consulting Physician (General Surgery) Nancy Promise, MD as Consulting Physician (Radiation Oncology) Nancy Cruel, MD as Consulting Physician (Oncology) OTHER MD:  CHIEF COMPLAINT: Triple positive breast cancer  CURRENT TREATMENT: To start neoadjuvant chemotherapy   BREAST CANCER HISTORY: From the original Intake note:  Nancy Austin herself found a lump in her left breast early October 2015 and brought it to her gynecologist attention. On 09/20/2014 she was set up for bilateral diagnostic mammography and left breast ultrasonography of the breast Center. This was the patient's first ever mammogram. In the area of concern in the left breast there was suspicious pleomorphic calcifications spanning 1.1 cm. There was no discrete mammographic mass in this dense breasts (category C.). On physical exam, there was a palpable firm at 1.5 cm mass at the 3:00 position in the left breast. By ultrasound this was irregular and hypoechoic and measured 1.8 cm. Ultrasound of the left axilla was unremarkable. Aside from multiple cysts in the left breast there were no other findings of concern.  On the same day, 09/20/2014, the patient underwent biopsy of the left breast palpable mass. This showed ([id number]) and invasive ductal carcinoma, grade 2, estrogen receptor 100% positive, progesterone receptor 89% positive, both with strong staining intensity, with an MIB-1 of 16%. HER-2 was amplified with a signals ratio of 5.07 and a copy number per cell of 6.85. Paragraph on 09/30/2014 the patient underwent bilateral breast MRIs. This showed a 1.7 cm mass in the upper outer quadrant of the left breast. There was no other suspicious finding in either breast and  no abnormal appearing adenopathy.  The patient's subsequent history is as detailed below.  INTERVAL HISTORY: Nancy Austin Returns today for follow-up of her breast cancer accompanied by her husband Nancy Austin. Since her last visit here she had an echocardiogram which shows an ejection fraction in the 60-65%. She also had a port placed 10/15/2014. She is going to be starting her chemotherapy 10/21/2014 and is here today to discuss the supportive medications.  REVIEW OF SYSTEMS: She did well with the port, but of course she still has some soreness. She is not taking the prescribed narcotics, but instead is using Aleve twice a day, which is working fairly well for her. She is not sleeping well at all. She is using Ambien which is helping. Aside from this a detailed review of systems today was noncontributory   PAST MEDICAL HISTORY: Past Medical History  Diagnosis Date  . Breast cancer of upper-outer quadrant of left female breast 09/24/2014    PAST SURGICAL HISTORY: Past Surgical History  Procedure Laterality Date  . Tonsillectomy  1996  . Portacath placement N/A 10/15/2014    Procedure: INSERTION PORT-A-CATH;  Surgeon: Nancy Bookbinder, MD;  Location: [facility];  Service: General;  Laterality: N/A;    FAMILY HISTORY Family History  Problem Relation Age of Onset  . Skin cancer Father   . Prostate cancer Maternal Uncle 4    currently 45  . Multiple myeloma Paternal Grandmother 34    Deceased 74  The patient's parents are both living. The patient has one brother, no sisters. One grandmother was diagnosed with multiple myeloma at age 75. There is no history of breast or ovarian cancer in the family.   GYNECOLOGIC  HISTORY:  Patient's last menstrual period was 09/28/2014.  menarche age 37, first live birth age 87. The patient is GX P2. She still having regular periods. She was on birth control pills on and off for the last 15 years, stopping in October of 2015.   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 Her husband Nancy Austin works as a Immunologist at [employer]. Their children are Nancy Austin age 36 and Nancy Austin age 46.    ADVANCED DIRECTIVES: In place   HEALTH MAINTENANCE: History  Substance Use Topics  . Smoking status: Never Smoker   . Smokeless tobacco: Never Used  . Alcohol Use: 1.8 oz/week    3 Glasses of wine per week     Colonoscopy:  PAP:  November 2014  Bone density:  Lipid panel:  Allergies  Allergen Reactions  . Morphine And Related Nausea And Vomiting    Current Outpatient Prescriptions  Medication Sig Dispense Refill  . dexamethasone (DECADRON) 4 MG tablet Take 2 tablets (8 mg total) by mouth 2 (two) times daily. Start the day before Taxotere. Then again the day after chemo for 3 days. 30 tablet 1  . diphenhydramine-acetaminophen (TYLENOL PM) 25-500 MG TABS Take 1 tablet by mouth at bedtime as needed (sleep).     Marland Kitchen HYDROcodone-acetaminophen (NORCO) 10-325 MG per tablet Take 1 tablet by mouth every 6 (six) hours as needed. 20 tablet 0  . lidocaine-prilocaine (EMLA) cream Apply 1 application topically as needed. Apply over port area 1-2 hours before chemo, then cover with plastic wrap 30 g 0  . LORazepam (ATIVAN) 0.5 MG tablet Take 1 tablet (0.5 mg total) by mouth every 6 (six) hours as needed (Nausea or vomiting). 30 tablet 0  . Multiple Vitamin (MULTIVITAMIN WITH MINERALS) TABS tablet Take 1 tablet by mouth every morning.    . Naproxen Sodium (ALEVE) 220 MG CAPS Take 400 mg by mouth daily as needed (pain.).    Marland Kitchen ondansetron (ZOFRAN) 8 MG tablet Take 1 tablet (8 mg total) by mouth 2 (two) times daily. Start the day after chemo for 3 days. Then take as needed for nausea or vomiting. 30 tablet 1  . prochlorperazine (COMPAZINE) 10 MG tablet Take 1 tablet (10 mg total) by mouth every 6 (six) hours as needed (Nausea or vomiting). 30 tablet 1  . tobramycin-dexamethasone (TOBRADEX) ophthalmic solution Place 1 drop into both eyes 2 (two) times daily.  5 mL 0  . zolpidem (AMBIEN) 5 MG tablet Take 5 mg by mouth at bedtime as needed for sleep.     No current facility-administered medications for this visit.    OBJECTIVE: young white woman who appears stated age 51 Vitals:   10/16/14 1645  BP: 121/80  Pulse: 60  Temp: 97.6 F (36.4 C)  Resp: 18     Body mass index is 23.22 kg/(m^2).    ECOG FS:0 - Asymptomatic  Physical exam not repeated today.   LAB RESULTS:  CMP     Component Value Date/Time   NA 144 10/02/2014 1206   K 3.9 10/02/2014 1206   CL 104 10/02/2014 1206   CO2 26 10/02/2014 1206   GLUCOSE 103* 10/02/2014 1206   BUN 11 10/02/2014 1206   CREATININE 0.87 10/02/2014 1206   CALCIUM 9.4 10/02/2014 1206   PROT 7.3 10/02/2014 1206   ALBUMIN 3.9 10/02/2014 1206   AST 20 10/02/2014 1206   ALT 16 10/02/2014 1206   ALKPHOS 45 10/02/2014 1206   BILITOT  0.4 10/02/2014 1206    I No results found for: SPEP  Lab Results  Component Value Date   WBC 6.5 10/02/2014   NEUTROABS 4.7 10/02/2014   HGB 13.8 10/02/2014   HCT 41.5 10/02/2014   MCV 87.9 10/02/2014   PLT 259 10/02/2014      Chemistry      Component Value Date/Time   NA 144 10/02/2014 1206   K 3.9 10/02/2014 1206   CL 104 10/02/2014 1206   CO2 26 10/02/2014 1206   BUN 11 10/02/2014 1206   CREATININE 0.87 10/02/2014 1206      Component Value Date/Time   CALCIUM 9.4 10/02/2014 1206   ALKPHOS 45 10/02/2014 1206   AST 20 10/02/2014 1206   ALT 16 10/02/2014 1206   BILITOT 0.4 10/02/2014 1206       No results found for: LABCA2  No components found for: LABCA125  No results for input(s): INR in the last 168 hours.  Urinalysis No results found for: COLORURINE  STUDIES: Mr Breast Bilateral W Wo Contrast  10/01/2014   CLINICAL DATA:  New diagnosis of left breast cancer. Ultrasound-guided core biopsy of mass in the 3 o'clock location of the left breast shows invasive ductal carcinoma.  LABS:  Not applicable  EXAM: BILATERAL BREAST MRI WITH  AND WITHOUT CONTRAST  TECHNIQUE: Multiplanar, multisequence MR images of both breasts were obtained prior to and following the intravenous administration of 65m of MultiHance.  THREE-DIMENSIONAL MR IMAGE RENDERING ON INDEPENDENT WORKSTATION:  Three-dimensional MR images were rendered by post-processing of the original MR data on an independent workstation. The three-dimensional MR images were interpreted, and findings are reported in the following complete MRI report for this study. Three dimensional images were evaluated at the independent DynaCad workstation  COMPARISON:  Mammogram from the [facility] 09[facility]/2014 and earlier  FINDINGS: Breast composition: c:  Heterogeneous fibroglandular tissue  Background parenchymal enhancement: Moderate  Right breast: No mass or abnormal enhancement.  Left breast: Within the upper-outer quadrant of the left breast there is an enhancing mass associated with clip artifact. Mass measures 1.7 x 1.7 x 1.6 cm and demonstrates rapid wash-in and plateau type enhancement kinetics. No other suspicious findings are identified in the left breast.  Lymph nodes: No abnormal appearing lymph nodes.  Ancillary findings:  None.  IMPRESSION: 1. 1.7 cm mass consistent with known malignancy in the upper-outer quadrant of the left breast. 2. No suspicious findings in the right breast.  RECOMMENDATION: Treatment plan  BI-RADS CATEGORY  6: Known biopsy-proven malignancy.   Electronically Signed   By: BShon HaleM.D.   On: 10/01/2014 09:42   Dg Chest Port 1 View  10/15/2014   CLINICAL DATA:  Port-A-Cath placement. Breast cancer of upper outer quadrant of left female breast.  EXAM: PORTABLE CHEST - 1 VIEW  COMPARISON:  None.  FINDINGS: The heart size and mediastinal contours are within normal limits. Both lungs are clear. Right subclavian Port-A-Cath is noted with distal tip overlying expected position of the SVC. No pneumothorax or pleural effusion is noted. The visualized  skeletal structures are unremarkable.  IMPRESSION: No pneumothorax seen status post right subclavian Port-A-Cath placement.   Electronically Signed   By: JSabino DickM.D.   On: 10/15/2014 09:33   Mm Digital Diagnostic Bilat  09/20/2014   CLINICAL DATA:  38year old with a palpable mass 3 o'clock position left breast.  EXAM: DIGITAL DIAGNOSTIC  BILATERAL MAMMOGRAM WITH CAD  ULTRASOUND LEFT BREAST  COMPARISON:  None.  ACR Breast Density Category c: The breast tissue is heterogeneously dense, which may obscure small masses.  FINDINGS: A skin BB was placed at the site of palpable concern in the outer left breast. Corresponding with the palpable area of concern, there are suspicious pleomorphic calcifications that were evaluated with magnification views. These calcifications span 1.1 x 0.8 x 1.0 cm. A definite/discrete mass is not confirmed mammographically. There is a circumscribed oval mass in the 12 o'clock region of the left breast, separate from the palpable area of concern.  No mass, distortion, or suspicious microcalcification is identified in the right breast.  Mammographic images were processed with CAD.  On physical exam, I palpate a very firm approximate 1.5 cm mass at 3 o'clock position left breast 2-3 cm from the nipple.  Ultrasound is performed, showing an irregular hypoechoic mass with some internal echogenic foci consistent with microcalcifications is seen at 3 o'clock position 2 to 3 cm from the nipple. On ultrasound, the mass measures 1.8 x 1.1 x 1.7 cm. Are some linear areas extension along the margin of the mass, suggesting abnormal ducts.  Multiple cysts are noted in the 12-1 o'clock region of the left breast.  Ultrasound of the left axilla demonstrates a normal left axillary lymph node. No lymphadenopathy is detected.  IMPRESSION: 1. Suspicious mass with associated pleomorphic calcifications in the 3 o'clock position of the left breast corresponds to the palpable mass. Findings are highly  suggestive of malignancy. 2. No evidence of malignancy in the right breast.  RECOMMENDATION: Ultrasound-guided core needle biopsy of the palpable left breast mass is recommended. The findings and recommendations were discussed with the patient and her husband in person today. The patient is able to stay for biopsy, which will be performed today and dictated separately.  I have discussed the findings and recommendations with the patient. Results were also provided in writing at the conclusion of the visit. If applicable, a reminder letter will be sent to the patient regarding the next appointment.  BI-RADS CATEGORY  5: Highly suggestive of malignancy.   Electronically Signed   By: Curlene Dolphin M.D.   On: 09/20/2014 16:10   Mm Digital Diagnostic Unilat L  09/20/2014   CLINICAL DATA:  Biopsy was performed of a palpable mass in the left breast 3 o'clock position.  EXAM: DIAGNOSTIC LEFT MAMMOGRAM POST ULTRASOUND BIOPSY  COMPARISON:  Previous exams  FINDINGS: Mammographic images were obtained following ultrasound guided biopsy of suspicious mass left breast 3 o'clock position. A ribbon shaped biopsy clip is satisfactorily positioned in the 3 o'clock position of the left breast within an area of added density. The pleomorphic calcifications are just deep to the biopsied mass.  IMPRESSION: Satisfactory position of ribbon shaped biopsy clip in the outer left breast.  Final Assessment: Post Procedure Mammograms for Marker Placement   Electronically Signed   By: Curlene Dolphin M.D.   On: 09/20/2014 16:37   Dg C-arm 1-60 Min-no Report  10/15/2014   CLINICAL DATA: surgery   C-ARM 1-60 MINUTES  Fluoroscopy was utilized by the requesting physician.  No radiographic  interpretation.    US Breast Ltd Uni Left Inc Axilla  09/20/2014   CLINICAL DATA:  38 year old with a palpable mass 3 o'clock position left breast.  EXAM: DIGITAL DIAGNOSTIC  BILATERAL MAMMOGRAM WITH CAD  ULTRASOUND LEFT BREAST  COMPARISON:  None.  ACR Breast  Density Category c: The breast tissue is heterogeneously dense, which may obscure small masses.  FINDINGS: A skin BB was placed at  the site of palpable concern in the outer left breast. Corresponding with the palpable area of concern, there are suspicious pleomorphic calcifications that were evaluated with magnification views. These calcifications span 1.1 x 0.8 x 1.0 cm. A definite/discrete mass is not confirmed mammographically. There is a circumscribed oval mass in the 12 o'clock region of the left breast, separate from the palpable area of concern.  No mass, distortion, or suspicious microcalcification is identified in the right breast.  Mammographic images were processed with CAD.  On physical exam, I palpate a very firm approximate 1.5 cm mass at 3 o'clock position left breast 2-3 cm from the nipple.  Ultrasound is performed, showing an irregular hypoechoic mass with some internal echogenic foci consistent with microcalcifications is seen at 3 o'clock position 2 to 3 cm from the nipple. On ultrasound, the mass measures 1.8 x 1.1 x 1.7 cm. Are some linear areas extension along the margin of the mass, suggesting abnormal ducts.  Multiple cysts are noted in the 12-1 o'clock region of the left breast.  Ultrasound of the left axilla demonstrates a normal left axillary lymph node. No lymphadenopathy is detected.  IMPRESSION: 1. Suspicious mass with associated pleomorphic calcifications in the 3 o'clock position of the left breast corresponds to the palpable mass. Findings are highly suggestive of malignancy. 2. No evidence of malignancy in the right breast.  RECOMMENDATION: Ultrasound-guided core needle biopsy of the palpable left breast mass is recommended. The findings and recommendations were discussed with the patient and her husband in person today. The patient is able to stay for biopsy, which will be performed today and dictated separately.  I have discussed the findings and recommendations with the patient.  Results were also provided in writing at the conclusion of the visit. If applicable, a reminder letter will be sent to the patient regarding the next appointment.  BI-RADS CATEGORY  5: Highly suggestive of malignancy.   Electronically Signed   By: Curlene Dolphin M.D.   On: 09/20/2014 16:10   [place] Lt Breast Bx W Loc Dev 1st Lesion Img Bx Spec US Guide  09/30/2014   ADDENDUM REPORT: 09/23/2014 11:21  ADDENDUM: Pathology revealed grade II invasive ductal carcinoma and ductal carcinoma in situ in the left breast. This was found to be concordant by Dr. Leonides Sake. Pathology was discussed with the patient by telephone. She reported doing well after the biopsy. Post biopsy instructions were reviewed and her questions were answered. She has been scheduled at [facility] on October 02, 2014. A bilateral breast MRI will be scheduled. She is encouraged to come to [facility] for educational materials. My number was provided for future questions and concerns.  Pathology results reported by Susa Raring RN, BSN on September 23, 2014.   Electronically Signed   By: Curlene Dolphin M.D.   On: 09/23/2014 11:21   09/30/2014   CLINICAL DATA:  Suspicious palpable mass with associated pleomorphic calcifications 3 o'clock position left breast. Biopsy was recommended.  EXAM: ULTRASOUND GUIDED LEFT BREAST CORE NEEDLE BIOPSY  COMPARISON:  Previous exams.  PROCEDURE: I met with the patient and we discussed the procedure of ultrasound-guided biopsy, including benefits and alternatives. We discussed the high likelihood of a successful procedure. We discussed the risks of the procedure including infection, bleeding, tissue injury, clip migration, and inadequate sampling. Informed written consent was given. The usual time-out protocol was performed immediately prior to the procedure.  Using sterile technique and 2% Lidocaine  as local anesthetic, under direct ultrasound  visualization, a 12 gauge vacuum-assisted device was used to perform biopsy of a suspicious palpable mass at 3 o'clock position using a lateral to medial approach. At the conclusion of the procedure, a tissue marker clip was deployed into the biopsy cavity. Follow-up 2-view mammogram was performed and dictated separately.  IMPRESSION: Ultrasound-guided biopsy of left breast mass. No apparent complications.  Electronically Signed: By: Curlene Dolphin M.D. On: 09/20/2014 16:12    ASSESSMENT: 38 y.o. [place] woman s/p Left breast upper outer quadrant biopsy 09/20/2014, for a clinical T1c N0, stage IA invasive ductal carcinoma, grade 2, estrogen and progesterone receptor positive, HER-2 amplified with a signal is ratio of 5.07, and an MIB-1 of 16%  (1) neoadjuvant treatment to consist of carboplatin, docetaxel, trastuzumab and pertuzumab given every 21 days x6, with Neulasta day 2   (2) definitive surgery to follow chemotherapy   (3) trastuzumab to be continued to total one year  (4) radiation to follow surgery if appropriate  (5) antiestrogen is to follow radiation  (6) genetics testing results pending  PLAN: Deniz is ready to start treatment. Emotionally this is difficult for her, as is understandable. I wrote her a prescription for a wig and we talked about how the children may respond to the changes they will see in the way she looks.  I gave her a copy of the "road map" on how to take the supportive medicines for the chemotherapy she will be receiving. We went over his in detail.All her prescriptions were placed. I am entering visits through the first 3 cycles of chemotherapy.  We also reviewed the common symptoms of menopause, which are likely to be fairly overwhelming in this young woman as they will occur without transition. If she starts having significant hot flashes we should have low threshold for starting venlafaxine.  Porshea has a good understanding of the overall plan. She  agrees with it. She knows the goal of treatment in her case is cure. She will call with any problems that may develop before her next visit here.  Nancy Cruel, MD   10/16/2014 5:58 PM

## 2014-10-17 ENCOUNTER — Telehealth: Payer: Self-pay | Admitting: Nurse Practitioner

## 2014-10-17 ENCOUNTER — Telehealth: Payer: Self-pay | Admitting: *Deleted

## 2014-10-17 NOTE — Telephone Encounter (Signed)
Per staff message and POF I have scheduled appts. Advised scheduler of appts. JMW  

## 2014-10-18 ENCOUNTER — Encounter: Payer: Self-pay | Admitting: *Deleted

## 2014-10-18 ENCOUNTER — Encounter: Payer: Self-pay | Admitting: Oncology

## 2014-10-18 NOTE — Progress Notes (Signed)
Per Celso Amy patient doesn't qualify for asst for herceptin and perjeta.(overqualified).

## 2014-10-21 ENCOUNTER — Ambulatory Visit (HOSPITAL_BASED_OUTPATIENT_CLINIC_OR_DEPARTMENT_OTHER): Payer: 59

## 2014-10-21 DIAGNOSIS — Z5112 Encounter for antineoplastic immunotherapy: Secondary | ICD-10-CM

## 2014-10-21 DIAGNOSIS — C50812 Malignant neoplasm of overlapping sites of left female breast: Secondary | ICD-10-CM

## 2014-10-21 DIAGNOSIS — C50412 Malignant neoplasm of upper-outer quadrant of left female breast: Secondary | ICD-10-CM

## 2014-10-21 DIAGNOSIS — Z5111 Encounter for antineoplastic chemotherapy: Secondary | ICD-10-CM

## 2014-10-21 MED ORDER — LORAZEPAM 2 MG/ML IJ SOLN
INTRAMUSCULAR | Status: AC
  Filled 2014-10-21: qty 1

## 2014-10-21 MED ORDER — ACETAMINOPHEN 325 MG PO TABS
650.0000 mg | ORAL_TABLET | Freq: Once | ORAL | Status: AC
  Administered 2014-10-21: 650 mg via ORAL

## 2014-10-21 MED ORDER — SODIUM CHLORIDE 0.9 % IV SOLN
571.5000 mg | Freq: Once | INTRAVENOUS | Status: AC
  Administered 2014-10-21: 570 mg via INTRAVENOUS
  Filled 2014-10-21: qty 57

## 2014-10-21 MED ORDER — ONDANSETRON 16 MG/50ML IVPB (CHCC)
INTRAVENOUS | Status: AC
  Filled 2014-10-21: qty 16

## 2014-10-21 MED ORDER — SODIUM CHLORIDE 0.9 % IV SOLN
840.0000 mg | Freq: Once | INTRAVENOUS | Status: AC
  Administered 2014-10-21: 840 mg via INTRAVENOUS
  Filled 2014-10-21: qty 28

## 2014-10-21 MED ORDER — DOCETAXEL CHEMO INJECTION 160 MG/16ML
75.0000 mg/m2 | Freq: Once | INTRAVENOUS | Status: AC
  Administered 2014-10-21: 130 mg via INTRAVENOUS
  Filled 2014-10-21: qty 13

## 2014-10-21 MED ORDER — DIPHENHYDRAMINE HCL 25 MG PO CAPS
ORAL_CAPSULE | ORAL | Status: AC
  Filled 2014-10-21: qty 1

## 2014-10-21 MED ORDER — HEPARIN SOD (PORK) LOCK FLUSH 100 UNIT/ML IV SOLN
500.0000 [IU] | Freq: Once | INTRAVENOUS | Status: AC | PRN
  Administered 2014-10-21: 500 [IU]
  Filled 2014-10-21: qty 5

## 2014-10-21 MED ORDER — DEXAMETHASONE SODIUM PHOSPHATE 20 MG/5ML IJ SOLN
20.0000 mg | Freq: Once | INTRAMUSCULAR | Status: AC
  Administered 2014-10-21: 20 mg via INTRAVENOUS

## 2014-10-21 MED ORDER — DIPHENHYDRAMINE HCL 25 MG PO CAPS
25.0000 mg | ORAL_CAPSULE | Freq: Once | ORAL | Status: AC
  Administered 2014-10-21: 25 mg via ORAL

## 2014-10-21 MED ORDER — SODIUM CHLORIDE 0.9 % IV SOLN
Freq: Once | INTRAVENOUS | Status: AC
  Administered 2014-10-21: 09:00:00 via INTRAVENOUS

## 2014-10-21 MED ORDER — DEXAMETHASONE SODIUM PHOSPHATE 20 MG/5ML IJ SOLN
INTRAMUSCULAR | Status: AC
  Filled 2014-10-21: qty 5

## 2014-10-21 MED ORDER — SODIUM CHLORIDE 0.9 % IV SOLN
8.0000 mg/kg | Freq: Once | INTRAVENOUS | Status: AC
  Administered 2014-10-21: 525 mg via INTRAVENOUS
  Filled 2014-10-21: qty 25

## 2014-10-21 MED ORDER — SODIUM CHLORIDE 0.9 % IV SOLN: Freq: Once | INTRAVENOUS | Status: DC

## 2014-10-21 MED ORDER — SODIUM CHLORIDE 0.9 % IJ SOLN
10.0000 mL | INTRAMUSCULAR | Status: DC | PRN
  Administered 2014-10-21: 10 mL
  Filled 2014-10-21: qty 10

## 2014-10-21 MED ORDER — LORAZEPAM 2 MG/ML IJ SOLN
0.5000 mg | Freq: Once | INTRAMUSCULAR | Status: AC
  Administered 2014-10-21: 0.5 mg via INTRAVENOUS

## 2014-10-21 MED ORDER — ONDANSETRON 16 MG/50ML IVPB (CHCC)
16.0000 mg | Freq: Once | INTRAVENOUS | Status: AC
  Administered 2014-10-21: 16 mg via INTRAVENOUS

## 2014-10-21 MED ORDER — ACETAMINOPHEN 325 MG PO TABS
ORAL_TABLET | ORAL | Status: AC
  Filled 2014-10-21: qty 2

## 2014-10-21 NOTE — Patient Instructions (Addendum)
[facility] Discharge Instructions for Patients Receiving Chemotherapy  Today you received the following chemotherapy agents: Herceptin, Perjeta, Taxotere and Carboplatin  To help prevent nausea and vomiting after your treatment, we encourage you to take your nausea medications as directed:   Ativan 0.5 mg every 6 hours as needed  Compazine 10 mg every 6 hours as needed  Decadron 8 mg twice daily on 11/10-11/12  Zofran 8 mg twice daily as needed-start today If you develop nausea and vomiting that is not controlled by your nausea medication, call the clinic.   BELOW ARE SYMPTOMS THAT SHOULD BE REPORTED IMMEDIATELY:  *FEVER GREATER THAN 100.5 F  *CHILLS WITH OR WITHOUT FEVER  NAUSEA AND VOMITING THAT IS NOT CONTROLLED WITH YOUR NAUSEA MEDICATION  *UNUSUAL SHORTNESS OF BREATH  *UNUSUAL BRUISING OR BLEEDING  TENDERNESS IN MOUTH AND THROAT WITH OR WITHOUT PRESENCE OF ULCERS  *URINARY PROBLEMS  *BOWEL PROBLEMS  UNUSUAL RASH Items with * indicate a potential emergency and should be followed up as soon as possible.  Feel free to call the clinic should you have any questions or concerns. The clinic phone number is [phone].

## 2014-10-21 NOTE — Progress Notes (Signed)
Tolerated Herceptin, Perjeta and Taxotere without adverse event. Feels warm and flushed-most likely related to steroids.

## 2014-10-22 ENCOUNTER — Telehealth: Payer: Self-pay | Admitting: *Deleted

## 2014-10-22 ENCOUNTER — Ambulatory Visit (HOSPITAL_BASED_OUTPATIENT_CLINIC_OR_DEPARTMENT_OTHER): Payer: 59

## 2014-10-22 DIAGNOSIS — C50412 Malignant neoplasm of upper-outer quadrant of left female breast: Secondary | ICD-10-CM

## 2014-10-22 DIAGNOSIS — Z5189 Encounter for other specified aftercare: Secondary | ICD-10-CM

## 2014-10-22 DIAGNOSIS — C50812 Malignant neoplasm of overlapping sites of left female breast: Secondary | ICD-10-CM

## 2014-10-22 MED ORDER — PEGFILGRASTIM INJECTION 6 MG/0.6ML ~~LOC~~
6.0000 mg | PREFILLED_SYRINGE | Freq: Once | SUBCUTANEOUS | Status: AC
  Administered 2014-10-22: 6 mg via SUBCUTANEOUS
  Filled 2014-10-22: qty 0.6

## 2014-10-22 NOTE — Progress Notes (Signed)
Pt states that she is eating and drinking fine.  Appetite is good. She c/o of headache but states other than that she feels fine.

## 2014-10-22 NOTE — Patient Instructions (Signed)
Pegfilgrastim injection What is this medicine? PEGFILGRASTIM (peg fil GRA stim) is a long-acting granulocyte colony-stimulating factor that stimulates the growth of neutrophils, a type of white blood cell important in the body's fight against infection. It is used to reduce the incidence of fever and infection in patients with certain types of cancer who are receiving chemotherapy that affects the bone marrow. This medicine may be used for other purposes; ask your health care provider or pharmacist if you have questions. COMMON BRAND NAME(S): Neulasta What should I tell my health care provider before I take this medicine? They need to know if you have any of these conditions: -latex allergy -ongoing radiation therapy -sickle cell disease -skin reactions to acrylic adhesives (On-Body Injector only) -an unusual or allergic reaction to pegfilgrastim, filgrastim, other medicines, foods, dyes, or preservatives -pregnant or trying to get pregnant -breast-feeding How should I use this medicine? This medicine is for injection under the skin. If you get this medicine at home, you will be taught how to prepare and give the pre-filled syringe or how to use the On-body Injector. Refer to the patient Instructions for Use for detailed instructions. Use exactly as directed. Take your medicine at regular intervals. Do not take your medicine more often than directed. It is important that you put your used needles and syringes in a special sharps container. Do not put them in a trash can. If you do not have a sharps container, call your pharmacist or healthcare provider to get one. Talk to your pediatrician regarding the use of this medicine in children. Special care may be needed. Overdosage: If you think you have taken too much of this medicine contact a poison control center or emergency room at once. NOTE: This medicine is only for you. Do not share this medicine with others. What if I miss a dose? It is  important not to miss your dose. Call your doctor or health care professional if you miss your dose. If you miss a dose due to an On-body Injector failure or leakage, a new dose should be administered as soon as possible using a single prefilled syringe for manual use. What may interact with this medicine? Interactions have not been studied. Give your health care provider a list of all the medicines, herbs, non-prescription drugs, or dietary supplements you use. Also tell them if you smoke, drink alcohol, or use illegal drugs. Some items may interact with your medicine. This list may not describe all possible interactions. Give your health care provider a list of all the medicines, herbs, non-prescription drugs, or dietary supplements you use. Also tell them if you smoke, drink alcohol, or use illegal drugs. Some items may interact with your medicine. What should I watch for while using this medicine? You may need blood work done while you are taking this medicine. If you are going to need a MRI, CT scan, or other procedure, tell your doctor that you are using this medicine (On-Body Injector only). What side effects may I notice from receiving this medicine? Side effects that you should report to your doctor or health care professional as soon as possible: -allergic reactions like skin rash, itching or hives, swelling of the face, lips, or tongue -dizziness -fever -pain, redness, or irritation at site where injected -pinpoint red spots on the skin -shortness of breath or breathing problems -stomach or side pain, or pain at the shoulder -swelling -tiredness -trouble passing urine Side effects that usually do not require medical attention (report to your doctor   or health care professional if they continue or are bothersome): -bone pain -muscle pain This list may not describe all possible side effects. Call your doctor for medical advice about side effects. You may report side effects to FDA at  1-800-FDA-1088. Where should I keep my medicine? Keep out of the reach of children. Store pre-filled syringes in a refrigerator between 2 and 8 degrees C (36 and 46 degrees F). Do not freeze. Keep in carton to protect from light. Throw away this medicine if it is left out of the refrigerator for more than 48 hours. Throw away any unused medicine after the expiration date. NOTE: This sheet is a summary. It may not cover all possible information. If you have questions about this medicine, talk to your doctor, pharmacist, or health care provider.  2015, Elsevier/Gold Standard. (2014-02-28 16:14:05)  

## 2014-10-22 NOTE — Telephone Encounter (Signed)
Called pt to check in after 1st chemo on 10/21/14. Pt relate 1st chemo went well and she is feeling fine. Received neulasta injection and is aware of possible s/e and how to manage. Encourage pt to call with needs. Received verbal understand.

## 2014-10-24 ENCOUNTER — Encounter: Payer: Self-pay | Admitting: *Deleted

## 2014-10-24 ENCOUNTER — Telehealth: Payer: Self-pay | Admitting: *Deleted

## 2014-10-24 MED ORDER — FIRST-DUKES MOUTHWASH MT SUSP: 5.0000 mL | Freq: Four times a day (QID) | OROMUCOSAL | Status: DC | PRN

## 2014-10-24 NOTE — Telephone Encounter (Signed)
This RN called pt per her discussion with DS/RN navigator and noted " red sores on uvala ".  Obtained identified VM- message left for pt to return call but also that prescription for mouth wash sent to pharmacy for symptom management.

## 2014-10-25 ENCOUNTER — Other Ambulatory Visit: Payer: Self-pay | Admitting: *Deleted

## 2014-10-25 DIAGNOSIS — C50412 Malignant neoplasm of upper-outer quadrant of left female breast: Secondary | ICD-10-CM

## 2014-10-28 ENCOUNTER — Other Ambulatory Visit (HOSPITAL_BASED_OUTPATIENT_CLINIC_OR_DEPARTMENT_OTHER): Payer: 59

## 2014-10-28 ENCOUNTER — Ambulatory Visit (HOSPITAL_BASED_OUTPATIENT_CLINIC_OR_DEPARTMENT_OTHER): Payer: 59 | Admitting: Oncology

## 2014-10-28 VITALS — BP 114/88 | HR 84 | Temp 98.5°F | Resp 18 | Ht 66.0 in | Wt 135.5 lb

## 2014-10-28 DIAGNOSIS — C50412 Malignant neoplasm of upper-outer quadrant of left female breast: Secondary | ICD-10-CM

## 2014-10-28 DIAGNOSIS — K1370 Unspecified lesions of oral mucosa: Secondary | ICD-10-CM

## 2014-10-28 DIAGNOSIS — R197 Diarrhea, unspecified: Secondary | ICD-10-CM

## 2014-10-28 LAB — CBC WITH DIFFERENTIAL/PLATELET
BASO%: 0.3 % (ref 0.0–2.0)
Basophils Absolute: 0 10*3/uL (ref 0.0–0.1)
EOS%: 0.1 % (ref 0.0–7.0)
Eosinophils Absolute: 0 10*3/uL (ref 0.0–0.5)
HEMATOCRIT: 46.5 % (ref 34.8–46.6)
HGB: 14.8 g/dL (ref 11.6–15.9)
LYMPH#: 1.7 10*3/uL (ref 0.9–3.3)
LYMPH%: 21.9 % (ref 14.0–49.7)
MCH: 28.1 pg (ref 25.1–34.0)
MCHC: 31.9 g/dL (ref 31.5–36.0)
MCV: 87.9 fL (ref 79.5–101.0)
MONO#: 1 10*3/uL — ABNORMAL HIGH (ref 0.1–0.9)
MONO%: 12.4 % (ref 0.0–14.0)
NEUT#: 5 10*3/uL (ref 1.5–6.5)
NEUT%: 65.3 % (ref 38.4–76.8)
Platelets: 177 10*3/uL (ref 145–400)
RBC: 5.29 10*6/uL (ref 3.70–5.45)
RDW: 12.3 % (ref 11.2–14.5)
WBC: 7.7 10*3/uL (ref 3.9–10.3)

## 2014-10-28 LAB — COMPREHENSIVE METABOLIC PANEL (CC13)
ALT: 42 U/L (ref 0–55)
ANION GAP: 14 meq/L — AB (ref 3–11)
AST: 20 U/L (ref 5–34)
Albumin: 3.9 g/dL (ref 3.5–5.0)
Alkaline Phosphatase: 64 U/L (ref 40–150)
BUN: 9.9 mg/dL (ref 7.0–26.0)
CALCIUM: 9.6 mg/dL (ref 8.4–10.4)
CHLORIDE: 99 meq/L (ref 98–109)
CO2: 25 meq/L (ref 22–29)
Creatinine: 0.9 mg/dL (ref 0.6–1.1)
Glucose: 99 mg/dl (ref 70–140)
POTASSIUM: 4 meq/L (ref 3.5–5.1)
SODIUM: 138 meq/L (ref 136–145)
TOTAL PROTEIN: 6.9 g/dL (ref 6.4–8.3)
Total Bilirubin: 0.27 mg/dL (ref 0.20–1.20)

## 2014-10-28 MED ORDER — CHOLESTYRAMINE 4 G PO PACK
4.0000 g | PACK | Freq: Three times a day (TID) | ORAL | Status: DC
Start: 2014-10-28 — End: 2015-02-14

## 2014-10-28 MED ORDER — VALACYCLOVIR HCL 500 MG PO TABS: 500.0000 mg | ORAL_TABLET | Freq: Every day | ORAL | Status: DC

## 2014-10-28 NOTE — Progress Notes (Signed)
[facility]  Telephone:(336) [phone] Fax:(336) [phone]     ID: Ka Bench Brickman DOB: 1976-11-03  MR#: [id number]  DPO#:[id number]  Patient Care Team: Logan Bores, MD as PCP - General (Obstetrics and Gynecology) Rolm Bookbinder, MD as Consulting Physician (General Surgery) Blair Promise, MD as Consulting Physician (Radiation Oncology) Chauncey Cruel, MD as Consulting Physician (Oncology) OTHER MD:  CHIEF COMPLAINT: Triple positive breast cancer  CURRENT TREATMENT: neoadjuvant chemotherapy and anti-HER-2 immunotherapy   BREAST CANCER HISTORY: From the original Intake note:  Nancy Austin herself found a lump in her left breast early October 2015 and brought it to her gynecologist attention. On 09/20/2014 she was set up for bilateral diagnostic mammography and left breast ultrasonography of the breast Center. This was the patient's first ever mammogram. In the area of concern in the left breast there was suspicious pleomorphic calcifications spanning 1.1 cm. There was no discrete mammographic mass in this dense breasts (category C.). On physical exam, there was a palpable firm at 1.5 cm mass at the 3:00 position in the left breast. By ultrasound this was irregular and hypoechoic and measured 1.8 cm. Ultrasound of the left axilla was unremarkable. Aside from multiple cysts in the left breast there were no other findings of concern.  On the same day, 09/20/2014, the patient underwent biopsy of the left breast palpable mass. This showed ([id number] [phone]) and invasive ductal carcinoma, grade 2, estrogen receptor 100% positive, progesterone receptor 89% positive, both with strong staining intensity, with an MIB-1 of 16%. HER-2 was amplified with a signals ratio of 5.07 and a copy number per cell of 6.85. Paragraph on 09/30/2014 the patient underwent bilateral breast MRIs. This showed a 1.7 cm mass in the upper outer quadrant of the left breast. There was no other suspicious finding  in either breast and no abnormal appearing adenopathy.  The patient's subsequent history is as detailed below.  INTERVAL HISTORY: Nancy Austin Returns today for follow-up of her breast cancer accompanied by her mother. Today is day 8 cycle 1 of 6 planned cycles of carboplatin, docetaxel, trastuzumab and pertuzumab  REVIEW OF SYSTEMS: Verlena did very well with chemotherapy for the first few days area specifically days one and 2 she was "fine", even though she received the Neulasta on day 2. On days 3 and 4 she felt a little tired and a little achy, but functioned fairly normally. On the evening of day for she started having sores in her mouth and throat. This made it harder for her to swallow. She was able to control the discomfort with Magic mouthwash, but she drank a lot less water on Friday and Saturday. Unfortunately those were the 2 days where she had the worst diarrhea, about 4-5 times a day. She actually did not start Imodium until Sunday. By that time the diarrhea had pretty much run its course. Aside from the mouth sores and the diarrhea, she has developed a rash which is slightly itchy bit over her lower face and chest. It is not at all in son protected areas. That started on day 6. Also her scalp has started hurting, although she has not yet lost her hair. She does have awake she likes. She has not had a period since early October. She is not yet having hot flashes. She tells me her mood is "okay" and she does not think she needs venlafaxine at this point. A detailed review of systems today was otherwise noncontributory  PAST MEDICAL HISTORY: Past Medical History  Diagnosis  Date  . Breast cancer of upper-outer quadrant of left female breast 09/24/2014    PAST SURGICAL HISTORY: Past Surgical History  Procedure Laterality Date  . Tonsillectomy  1996  . Portacath placement N/A 10/15/2014    Procedure: INSERTION PORT-A-CATH;  Surgeon: Rolm Bookbinder, MD;  Location: [facility];  Service: General;   Laterality: N/A;    FAMILY HISTORY Family History  Problem Relation Age of Onset  . Skin cancer Father   . Prostate cancer Maternal Uncle 63    currently 4  . Multiple myeloma Paternal Grandmother 1    Deceased 8  The patient's parents are both living. The patient has one brother, no sisters. One grandmother was diagnosed with multiple myeloma at age 47. There is no history of breast or ovarian cancer in the family.   GYNECOLOGIC HISTORY:  Patient's last menstrual period was 09/28/2014.  menarche age 38, first live birth age 38. The patient is GX P2. She still having regular periods. She was on birth control pills on and off for the last 15 years, stopping in October of 2015.   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 Her husband Rolla Plate works as a Immunologist at [employer]. Their children are Sam age 88 and Terrence Dupont age 16.    ADVANCED DIRECTIVES: In place   HEALTH MAINTENANCE: History  Substance Use Topics  . Smoking status: Never Smoker   . Smokeless tobacco: Never Used  . Alcohol Use: 1.8 oz/week    3 Glasses of wine per week     Colonoscopy:  PAP:  November 2014  Bone density:  Lipid panel:  Allergies  Allergen Reactions  . Morphine And Related Nausea And Vomiting    Current Outpatient Prescriptions  Medication Sig Dispense Refill  . dexamethasone (DECADRON) 4 MG tablet Take 2 tablets (8 mg total) by mouth 2 (two) times daily. Start the day before Taxotere. Then again the day after chemo for 3 days. 30 tablet 1  . Diphenhyd-Hydrocort-Nystatin (FIRST-DUKES MOUTHWASH) SUSP Take 5-10 mLs by mouth 4 (four) times daily as needed (pt may swish spit or swallow). 1 to 1 to 1 ratio (Patient taking differently: Take 5-10 mLs by mouth 4 (four) times daily as needed (pt may swish spit or swallow). ) 240 mL 3  . diphenhydramine-acetaminophen (TYLENOL PM) 25-500 MG TABS Take 1 tablet by mouth at bedtime as needed (sleep).     Marland Kitchen  HYDROcodone-acetaminophen (NORCO) 10-325 MG per tablet Take 1 tablet by mouth every 6 (six) hours as needed. 20 tablet 0  . lidocaine-prilocaine (EMLA) cream Apply 1 application topically as needed. Apply over port area 1-2 hours before chemo, then cover with plastic wrap 30 g 0  . LORazepam (ATIVAN) 0.5 MG tablet Take 1 tablet (0.5 mg total) by mouth every 6 (six) hours as needed (Nausea or vomiting). 30 tablet 0  . Multiple Vitamin (MULTIVITAMIN WITH MINERALS) TABS tablet Take 1 tablet by mouth every morning.    . Naproxen Sodium (ALEVE) 220 MG CAPS Take 400 mg by mouth daily as needed (pain.).    Marland Kitchen ondansetron (ZOFRAN) 8 MG tablet Take 1 tablet (8 mg total) by mouth 2 (two) times daily. Start the day after chemo for 3 days. Then take as needed for nausea or vomiting. 30 tablet 1  . prochlorperazine (COMPAZINE) 10 MG tablet Take 1 tablet (10 mg total) by mouth every 6 (six) hours as needed (Nausea or vomiting). 30 tablet 1  . tobramycin-dexamethasone (TOBRADEX)  ophthalmic solution Place 1 drop into both eyes 2 (two) times daily. 5 mL 0  . zolpidem (AMBIEN) 5 MG tablet Take 5 mg by mouth at bedtime as needed for sleep.     No current facility-administered medications for this visit.    OBJECTIVE: young white woman  Filed Vitals:   10/28/14 1626  BP: 114/88  Pulse: 84  Temp: 98.5 F (36.9 C)  Resp: 18     Body mass index is 21.88 kg/(m^2).    ECOG FS:1 - Symptomatic but completely ambulatory  Sclerae unicteric, pupils equal and reactive Oropharynx is still irritated, with some punctate erythematous lesions in the soft palate--- no thrush No cervical or supraclavicular adenopathy Lungs no rales or rhonchi Heart regular rate and rhythm Abd soft, nontender, positive bowel sounds MSK no focal spinal tenderness, no lymphedema Neuro: nonfocal, well oriented, appropriate affect Breasts: the right breast is unremarkable. I do not palpate a well-defined mass in the left breast. I have reviewed  our prior notes, and by the time we examined her here in clinic she had just had her biopsy so much of what we were feeling was likely ecchymosis. The mass (at 3:00) was however palpable by the patient and the mammographer prior to the biopsy. It is not palpable to me today. The left axilla is benign     LAB RESULTS:  CMP     Component Value Date/Time   NA 138 10/28/2014 1604   NA 144 10/02/2014 1206   K 4.0 10/28/2014 1604   K 3.9 10/02/2014 1206   CL 104 10/02/2014 1206   CO2 25 10/28/2014 1604   CO2 26 10/02/2014 1206   GLUCOSE 99 10/28/2014 1604   GLUCOSE 103* 10/02/2014 1206   BUN 9.9 10/28/2014 1604   BUN 11 10/02/2014 1206   CREATININE 0.9 10/28/2014 1604   CREATININE 0.87 10/02/2014 1206   CALCIUM 9.6 10/28/2014 1604   CALCIUM 9.4 10/02/2014 1206   PROT 6.9 10/28/2014 1604   PROT 7.3 10/02/2014 1206   ALBUMIN 3.9 10/28/2014 1604   ALBUMIN 3.9 10/02/2014 1206   AST 20 10/28/2014 1604   AST 20 10/02/2014 1206   ALT 42 10/28/2014 1604   ALT 16 10/02/2014 1206   ALKPHOS 64 10/28/2014 1604   ALKPHOS 45 10/02/2014 1206   BILITOT 0.27 10/28/2014 1604   BILITOT 0.4 10/02/2014 1206    I No results found for: SPEP  Lab Results  Component Value Date   WBC 7.7 10/28/2014   NEUTROABS 5.0 10/28/2014   HGB 14.8 10/28/2014   HCT 46.5 10/28/2014   MCV 87.9 10/28/2014   PLT 177 10/28/2014      Chemistry      Component Value Date/Time   NA 138 10/28/2014 1604   NA 144 10/02/2014 1206   K 4.0 10/28/2014 1604   K 3.9 10/02/2014 1206   CL 104 10/02/2014 1206   CO2 25 10/28/2014 1604   CO2 26 10/02/2014 1206   BUN 9.9 10/28/2014 1604   BUN 11 10/02/2014 1206   CREATININE 0.9 10/28/2014 1604   CREATININE 0.87 10/02/2014 1206      Component Value Date/Time   CALCIUM 9.6 10/28/2014 1604   CALCIUM 9.4 10/02/2014 1206   ALKPHOS 64 10/28/2014 1604   ALKPHOS 45 10/02/2014 1206   AST 20 10/28/2014 1604   AST 20 10/02/2014 1206   ALT 42 10/28/2014 1604   ALT 16  10/02/2014 1206   BILITOT 0.27 10/28/2014 1604   BILITOT 0.4 10/02/2014 1206  No results found for: LABCA2  No components found for: XIPJA250  No results for input(s): INR in the last 168 hours.  Urinalysis No results found for: COLORURINE  STUDIES: Mr Breast Bilateral W Wo Contrast  10/01/2014   CLINICAL DATA:  New diagnosis of left breast cancer. Ultrasound-guided core biopsy of mass in the 3 o'clock location of the left breast shows invasive ductal carcinoma.  LABS:  Not applicable  EXAM: BILATERAL BREAST MRI WITH AND WITHOUT CONTRAST  TECHNIQUE: Multiplanar, multisequence MR images of both breasts were obtained prior to and following the intravenous administration of 38m of MultiHance.  THREE-DIMENSIONAL MR IMAGE RENDERING ON INDEPENDENT WORKSTATION:  Three-dimensional MR images were rendered by post-processing of the original MR data on an independent workstation. The three-dimensional MR images were interpreted, and findings are reported in the following complete MRI report for this study. Three dimensional images were evaluated at the independent DynaCad workstation  COMPARISON:  [facility] and earlier  FINDINGS: Breast composition: c:  Heterogeneous fibroglandular tissue  Background parenchymal enhancement: Moderate  Right breast: No mass or abnormal enhancement.  Left breast: Within the upper-outer quadrant of the left breast there is an enhancing mass associated with clip artifact. Mass measures 1.7 x 1.7 x 1.6 cm and demonstrates rapid wash-in and plateau type enhancement kinetics. No other suspicious findings are identified in the left breast.  Lymph nodes: No abnormal appearing lymph nodes.  Ancillary findings:  None.  IMPRESSION: 1. 1.7 cm mass consistent with known malignancy in the upper-outer quadrant of the left breast. 2. No suspicious findings in the right breast.  RECOMMENDATION: Treatment plan  BI-RADS CATEGORY  6:  Known biopsy-proven malignancy.   Electronically Signed   By: BShon HaleM.D.   On: 10/01/2014 09:42   Dg Chest Port 1 View  10/15/2014   CLINICAL DATA:  Port-A-Cath placement. Breast cancer of upper outer quadrant of left female breast.  EXAM: PORTABLE CHEST - 1 VIEW  COMPARISON:  None.  FINDINGS: The heart size and mediastinal contours are within normal limits. Both lungs are clear. Right subclavian Port-A-Cath is noted with distal tip overlying expected position of the SVC. No pneumothorax or pleural effusion is noted. The visualized skeletal structures are unremarkable.  IMPRESSION: No pneumothorax seen status post right subclavian Port-A-Cath placement.   Electronically Signed   By: JSabino DickM.D.   On: 10/15/2014 09:33   Dg C-arm 1-60 Min-no Report  10/23/2014   : Fluoroscopy was utilized by the requesting physician. No radiographic interpretation.   Electronically Signed   By:Porfirio Mylar  On: 10/23/2014 19:02    ASSESSMENT: 38y.o. [place] woman s/p Left breast upper outer quadrant biopsy 09/20/2014, for a clinical T1c N0, stage IA invasive ductal carcinoma, grade 2, estrogen and progesterone receptor positive, HER-2 amplified with a signal is ratio of 5.07, and an MIB-1 of 16%  (1) neoadjuvant treatment to consist of carboplatin, docetaxel, trastuzumab and pertuzumab given every 21 days x6, with Neulasta day 2, started 10/21/2014  (2) definitive surgery to follow chemotherapy   (3) trastuzumab to be continued to total one year  (4) radiation to follow surgery if appropriate  (5) antiestrogen is to follow radiation  (6) genetics testing results pending  PLAN: LKalebdid generally well with her first treatment, but we should be able to make the second treatment considerably better. As far as the diarrhea is concerned I have asked her to take Imodium up to  6 times daily, starting with the first diarrheal bowel movement and repeat after each diarrheal bowel movement. Also she  will start Questran twice a day beginning on Friday morning, which is day 5, and continuing through day 7.  I think that will significantly help the diarrhea. The mouth sores may or may not be preventable but I am adding valacyclovir 500 mg for her to take daily beginning now and continuing through the end of her chemotherapy. As far as the rash on her lower face and upper neck, I have asked her to avoid sunlight as much as possible. She may also take Benadryl. We can try steroid creams, or clindamycin cream, but at present it seems to be stable to improving and I would rather just observe her. She will call if it becomes worse.  We discussed the fact that she is likely to lose her hair within the next few days. She has not mentioned this to her children yet. We discussed that at length. We also again discussed the possible use of venlafaxine. She has not had a period for over a month, and very likely will start having hot flashes soon. Venlafaxine is also helpful in that regard, of course  Finally, with a mouth sores she was not able to keep herself well-hydrated on days 5 and 6. I am putting her down for fluids both those days. If she can drink water and a half either of those days she will call and cancel that particular day  Adamary has a good understanding of the overall plan. She agrees with it. She knows the goal of treatment in her case is cure. She will call with any problems that may develop before her next visit here.  Chauncey Cruel, MD   10/28/2014 5:29 PM

## 2014-10-29 ENCOUNTER — Telehealth: Payer: Self-pay | Admitting: Oncology

## 2014-10-29 NOTE — Telephone Encounter (Signed)
per pof to sch appt-sent MW email to sch pt appt. WIll call pt once reply from [facility]

## 2014-10-29 NOTE — Telephone Encounter (Signed)
lvm for pt regarding to 11.20 appt....advised to pick up new sched with added ivf in DEC

## 2014-10-30 ENCOUNTER — Encounter: Payer: Self-pay | Admitting: Genetic Counselor

## 2014-10-30 ENCOUNTER — Telehealth: Payer: Self-pay | Admitting: *Deleted

## 2014-10-30 ENCOUNTER — Telehealth: Payer: Self-pay | Admitting: Oncology

## 2014-10-30 MED ORDER — DOXYCYCLINE HYCLATE 100 MG PO TABS: 100.0000 mg | ORAL_TABLET | Freq: Two times a day (BID) | ORAL | Status: DC

## 2014-10-30 NOTE — Telephone Encounter (Signed)
per pof to sch pt IV sent MW email to sch-recvd respons infusion was sch sent back to MW to reply if correct. Also asked MW if an in basket was sent prev to chge labs-nothing is matching as it should.

## 2014-10-30 NOTE — Progress Notes (Signed)
GENETIC TEST RESULTS  Patient Name: Nancy Austin Patient Age: 38 y.o. Encounter Date: 10/30/2014  Referring Physician: Lurline Del, MD   Ms. Drumwright was called today to discuss genetic test results. Please see the Genetics note from her visit on 10/10/2014 for a detailed discussion of her personal and family history.  GENETIC TESTING: At the time of Ms. Baldonado visit, we recommended she pursue genetic testing of multiple genes on the BreastNext gene panel. This test, which included sequencing and deletion/duplication analysis of 17 genes, was performed at [facility]. Testing was normal and did not reveal a mutation in these genes. The genes tested were ATM, BARD1, BRCA1, BRCA2, BRIP1, CDH1, CHEK2, MRE11A, MUTYH, NBN, NF1, PALB2, PTEN, RAD50, RAD51C, RAD51D, and TP53.  We discussed with Ms. Allshouse that since the current test is not perfect, it is possible there may be a gene mutation that current testing cannot detect, but that chance is small. We also discussed that it is possible that a different genetic factor, which was not part of this testing or has not yet been discovered, is responsible for the cancer diagnoses in the family. Given her family history, this is also of low likelihood. Should Ms. Barz wish to discuss or pursue this additional testing, we are happy to coordinate this at any time, but do not feel that she is at significant risk of harboring a mutation in a different gene.     CANCER SCREENING: This result suggests that Ms. Adcox cancer was most likely not due to an inherited predisposition. Most cancers happen by chance and this negative test, along with details of her family history, suggests that her cancer falls into this category. We, therefore, recommended she continue to follow the cancer screening guidelines provided by her physician.   FAMILY MEMBERS: Women in the family are at some increased risk of developing breast cancer, over the general population risk,  simply due to the family history. We recommended they have a yearly mammogram starting 10 years younger than her diagnosis, a yearly clinical breast exam, and perform monthly breast self-exams. A gynecologic exam is recommended yearly. Colon cancer screening is recommended to begin by age 54.  Lastly, we discussed with Ms. Cangemi that cancer genetics is a rapidly advancing field and it is possible that new genetic tests will be appropriate for her in the future. We encouraged her to remain in contact with [place] on an annual basis so we can update her personal and family histories, and let her know of advances in cancer genetics that may benefit the family. Our contact number was provided. Ms. Segovia questions were answered to her satisfaction today, and she knows she is welcome to call anytime with additional questions.    Steele Berg, MS, [place] Certified Genetic Counselor phone: [phone] [email or web address]

## 2014-10-30 NOTE — Telephone Encounter (Signed)
Pt called to this RN to state ongoing issues with rash not relieved with benadryl.  Per MD review recommended doxyclycline- discussed with pt and prescription sent to pharmacy.

## 2014-10-31 ENCOUNTER — Other Ambulatory Visit: Payer: Self-pay | Admitting: Oncology

## 2014-10-31 ENCOUNTER — Other Ambulatory Visit: Payer: Self-pay | Admitting: *Deleted

## 2014-10-31 DIAGNOSIS — C50412 Malignant neoplasm of upper-outer quadrant of left female breast: Secondary | ICD-10-CM

## 2014-10-31 NOTE — Progress Notes (Unsigned)
Testing was normal and did not reveal a mutation in these genes. The genes tested were ATM, BARD1, BRCA1, BRCA2, BRIP1, CDH1, CHEK2, MRE11A, MUTYH, NBN, NF1, PALB2, PTEN, RAD50, RAD51C, RAD51D, and TP53.

## 2014-11-01 ENCOUNTER — Other Ambulatory Visit (HOSPITAL_BASED_OUTPATIENT_CLINIC_OR_DEPARTMENT_OTHER): Payer: 59

## 2014-11-01 ENCOUNTER — Encounter: Payer: Self-pay | Admitting: Oncology

## 2014-11-01 DIAGNOSIS — C50412 Malignant neoplasm of upper-outer quadrant of left female breast: Secondary | ICD-10-CM

## 2014-11-01 LAB — COMPREHENSIVE METABOLIC PANEL (CC13)
ALK PHOS: 70 U/L (ref 40–150)
ALT: 28 U/L (ref 0–55)
AST: 19 U/L (ref 5–34)
Albumin: 3.4 g/dL — ABNORMAL LOW (ref 3.5–5.0)
Anion Gap: 8 mEq/L (ref 3–11)
BUN: 4.8 mg/dL — AB (ref 7.0–26.0)
CO2: 24 mEq/L (ref 22–29)
CREATININE: 0.8 mg/dL (ref 0.6–1.1)
Calcium: 8.9 mg/dL (ref 8.4–10.4)
Chloride: 109 mEq/L (ref 98–109)
Glucose: 89 mg/dl (ref 70–140)
Potassium: 3.8 mEq/L (ref 3.5–5.1)
Sodium: 141 mEq/L (ref 136–145)
Total Bilirubin: 0.22 mg/dL (ref 0.20–1.20)
Total Protein: 5.8 g/dL — ABNORMAL LOW (ref 6.4–8.3)

## 2014-11-01 LAB — CBC WITH DIFFERENTIAL/PLATELET
BASO%: 0.2 % (ref 0.0–2.0)
Basophils Absolute: 0 10*3/uL (ref 0.0–0.1)
EOS%: 0 % (ref 0.0–7.0)
Eosinophils Absolute: 0 10*3/uL (ref 0.0–0.5)
HCT: 37.3 % (ref 34.8–46.6)
HGB: 12.3 g/dL (ref 11.6–15.9)
LYMPH%: 13.2 % — ABNORMAL LOW (ref 14.0–49.7)
MCH: 28.7 pg (ref 25.1–34.0)
MCHC: 33 g/dL (ref 31.5–36.0)
MCV: 86.9 fL (ref 79.5–101.0)
MONO#: 0.5 10*3/uL (ref 0.1–0.9)
MONO%: 5.5 % (ref 0.0–14.0)
NEUT#: 7.4 10*3/uL — ABNORMAL HIGH (ref 1.5–6.5)
NEUT%: 81.1 % — AB (ref 38.4–76.8)
Platelets: 239 10*3/uL (ref 145–400)
RBC: 4.29 10*6/uL (ref 3.70–5.45)
RDW: 12.6 % (ref 11.2–14.5)
WBC: 9.1 10*3/uL (ref 3.9–10.3)
lymph#: 1.2 10*3/uL (ref 0.9–3.3)

## 2014-11-01 NOTE — Progress Notes (Signed)
Recd notes from [place]. Patient approved for perjeta and herceptin asst.  Patient FV#[id number]  10/07/14-10/06/15  25,000 for both. Will advised billing and medical records.

## 2014-11-01 NOTE — Progress Notes (Signed)
Per Thedora Hinders she is now approved for copay asst. I called they need some information??

## 2014-11-06 ENCOUNTER — Telehealth: Payer: Self-pay | Admitting: Nurse Practitioner

## 2014-11-06 NOTE — Telephone Encounter (Signed)
per pof to sch pt appt-cld & left pt a message that sch had to chge due to limited chairs in trmtroom. Left pt new time & date

## 2014-11-08 ENCOUNTER — Other Ambulatory Visit: Payer: Self-pay | Admitting: *Deleted

## 2014-11-08 DIAGNOSIS — C50412 Malignant neoplasm of upper-outer quadrant of left female breast: Secondary | ICD-10-CM

## 2014-11-11 ENCOUNTER — Other Ambulatory Visit: Payer: Self-pay | Admitting: Oncology

## 2014-11-11 ENCOUNTER — Other Ambulatory Visit: Payer: 59

## 2014-11-11 ENCOUNTER — Ambulatory Visit (HOSPITAL_BASED_OUTPATIENT_CLINIC_OR_DEPARTMENT_OTHER): Payer: 59

## 2014-11-11 ENCOUNTER — Other Ambulatory Visit (HOSPITAL_BASED_OUTPATIENT_CLINIC_OR_DEPARTMENT_OTHER): Payer: 59

## 2014-11-11 ENCOUNTER — Ambulatory Visit (HOSPITAL_BASED_OUTPATIENT_CLINIC_OR_DEPARTMENT_OTHER): Payer: 59 | Admitting: Nurse Practitioner

## 2014-11-11 ENCOUNTER — Encounter: Payer: Self-pay | Admitting: Nurse Practitioner

## 2014-11-11 VITALS — BP 124/76 | HR 63 | Temp 98.8°F | Resp 18 | Ht 66.0 in | Wt 138.8 lb

## 2014-11-11 DIAGNOSIS — C50812 Malignant neoplasm of overlapping sites of left female breast: Secondary | ICD-10-CM

## 2014-11-11 DIAGNOSIS — Z170 Estrogen receptor positive status [ER+]: Secondary | ICD-10-CM

## 2014-11-11 DIAGNOSIS — Z5112 Encounter for antineoplastic immunotherapy: Secondary | ICD-10-CM

## 2014-11-11 DIAGNOSIS — C50412 Malignant neoplasm of upper-outer quadrant of left female breast: Secondary | ICD-10-CM

## 2014-11-11 DIAGNOSIS — Z5111 Encounter for antineoplastic chemotherapy: Secondary | ICD-10-CM

## 2014-11-11 LAB — COMPREHENSIVE METABOLIC PANEL (CC13)
ALT: 30 U/L (ref 0–55)
AST: 21 U/L (ref 5–34)
Albumin: 4 g/dL (ref 3.5–5.0)
Alkaline Phosphatase: 59 U/L (ref 40–150)
Anion Gap: 11 mEq/L (ref 3–11)
BILIRUBIN TOTAL: 0.54 mg/dL (ref 0.20–1.20)
BUN: 9 mg/dL (ref 7.0–26.0)
CO2: 23 meq/L (ref 22–29)
CREATININE: 0.7 mg/dL (ref 0.6–1.1)
Calcium: 9.8 mg/dL (ref 8.4–10.4)
Chloride: 106 mEq/L (ref 98–109)
Glucose: 161 mg/dl — ABNORMAL HIGH (ref 70–140)
Potassium: 3.9 mEq/L (ref 3.5–5.1)
Sodium: 140 mEq/L (ref 136–145)
Total Protein: 6.7 g/dL (ref 6.4–8.3)

## 2014-11-11 LAB — CBC WITH DIFFERENTIAL/PLATELET
BASO%: 0.3 % (ref 0.0–2.0)
BASOS ABS: 0 10*3/uL (ref 0.0–0.1)
EOS%: 0.6 % (ref 0.0–7.0)
Eosinophils Absolute: 0 10*3/uL (ref 0.0–0.5)
HEMATOCRIT: 38.3 % (ref 34.8–46.6)
HGB: 12.5 g/dL (ref 11.6–15.9)
LYMPH%: 10.2 % — AB (ref 14.0–49.7)
MCH: 29 pg (ref 25.1–34.0)
MCHC: 32.6 g/dL (ref 31.5–36.0)
MCV: 88.7 fL (ref 79.5–101.0)
MONO#: 0.2 10*3/uL (ref 0.1–0.9)
MONO%: 3 % (ref 0.0–14.0)
NEUT#: 6.6 10*3/uL — ABNORMAL HIGH (ref 1.5–6.5)
NEUT%: 85.9 % — AB (ref 38.4–76.8)
PLATELETS: 286 10*3/uL (ref 145–400)
RBC: 4.32 10*6/uL (ref 3.70–5.45)
RDW: 13.1 % (ref 11.2–14.5)
WBC: 7.6 10*3/uL (ref 3.9–10.3)
lymph#: 0.8 10*3/uL — ABNORMAL LOW (ref 0.9–3.3)

## 2014-11-11 MED ORDER — DIPHENHYDRAMINE HCL 25 MG PO CAPS
ORAL_CAPSULE | ORAL | Status: AC
  Filled 2014-11-11: qty 1

## 2014-11-11 MED ORDER — SODIUM CHLORIDE 0.9 % IV SOLN
420.0000 mg | Freq: Once | INTRAVENOUS | Status: AC
  Administered 2014-11-11: 420 mg via INTRAVENOUS
  Filled 2014-11-11: qty 14

## 2014-11-11 MED ORDER — SODIUM CHLORIDE 0.9 % IJ SOLN
10.0000 mL | INTRAMUSCULAR | Status: DC | PRN
  Administered 2014-11-11: 10 mL
  Filled 2014-11-11: qty 10

## 2014-11-11 MED ORDER — ACETAMINOPHEN 325 MG PO TABS
650.0000 mg | ORAL_TABLET | Freq: Once | ORAL | Status: AC
  Administered 2014-11-11: 650 mg via ORAL

## 2014-11-11 MED ORDER — ONDANSETRON 16 MG/50ML IVPB (CHCC)
INTRAVENOUS | Status: AC
  Filled 2014-11-11: qty 16

## 2014-11-11 MED ORDER — ONDANSETRON 16 MG/50ML IVPB (CHCC)
16.0000 mg | Freq: Once | INTRAVENOUS | Status: AC
  Administered 2014-11-11: 16 mg via INTRAVENOUS

## 2014-11-11 MED ORDER — DEXAMETHASONE SODIUM PHOSPHATE 20 MG/5ML IJ SOLN
20.0000 mg | Freq: Once | INTRAMUSCULAR | Status: AC
  Administered 2014-11-11: 20 mg via INTRAVENOUS

## 2014-11-11 MED ORDER — SODIUM CHLORIDE 0.9 % IV SOLN
610.0000 mg | Freq: Once | INTRAVENOUS | Status: AC
  Administered 2014-11-11: 610 mg via INTRAVENOUS
  Filled 2014-11-11: qty 61

## 2014-11-11 MED ORDER — DIPHENHYDRAMINE HCL 25 MG PO CAPS
25.0000 mg | ORAL_CAPSULE | Freq: Once | ORAL | Status: AC
  Administered 2014-11-11: 25 mg via ORAL

## 2014-11-11 MED ORDER — DOCETAXEL CHEMO INJECTION 160 MG/16ML
75.0000 mg/m2 | Freq: Once | INTRAVENOUS | Status: AC
  Administered 2014-11-11: 130 mg via INTRAVENOUS
  Filled 2014-11-11: qty 13

## 2014-11-11 MED ORDER — LORAZEPAM 2 MG/ML IJ SOLN
INTRAMUSCULAR | Status: AC
  Filled 2014-11-11: qty 1

## 2014-11-11 MED ORDER — DEXAMETHASONE SODIUM PHOSPHATE 20 MG/5ML IJ SOLN
INTRAMUSCULAR | Status: AC
  Filled 2014-11-11: qty 5

## 2014-11-11 MED ORDER — TRASTUZUMAB CHEMO INJECTION 440 MG
6.0000 mg/kg | Freq: Once | INTRAVENOUS | Status: AC
  Administered 2014-11-11: 378 mg via INTRAVENOUS
  Filled 2014-11-11: qty 18

## 2014-11-11 MED ORDER — LORAZEPAM 2 MG/ML IJ SOLN
0.5000 mg | Freq: Once | INTRAMUSCULAR | Status: AC
  Administered 2014-11-11: 0.5 mg via INTRAVENOUS

## 2014-11-11 MED ORDER — HEPARIN SOD (PORK) LOCK FLUSH 100 UNIT/ML IV SOLN
500.0000 [IU] | Freq: Once | INTRAVENOUS | Status: AC | PRN
  Administered 2014-11-11: 500 [IU]
  Filled 2014-11-11: qty 5

## 2014-11-11 MED ORDER — ACETAMINOPHEN 325 MG PO TABS
ORAL_TABLET | ORAL | Status: AC
  Filled 2014-11-11: qty 2

## 2014-11-11 NOTE — Addendum Note (Signed)
Addended by: Marcelino Duster on: 11/11/2014 02:18 PM   Modules accepted: Orders

## 2014-11-11 NOTE — Progress Notes (Signed)
[facility]  [phone] [phone]     ID: Nancy Austin DOB: [birth year]  MR#: [id number]  FTD#:[id number]  Patient Care Team: Logan Bores, MD as PCP - General (Obstetrics and Gynecology) Rolm Bookbinder, MD as Consulting Physician (General Surgery) Blair Promise, MD as Consulting Physician (Radiation Oncology) Chauncey Cruel, MD as Consulting Physician (Oncology) OTHER MD:  CHIEF COMPLAINT: Triple positive breast cancer  CURRENT TREATMENT: neoadjuvant chemotherapy and anti-HER-2 immunotherapy   BREAST CANCER HISTORY: From the original Intake note:  Nancy Austin herself found a lump in her left breast early October 2015 and brought it to her gynecologist attention. On 09/20/2014 she was set up for bilateral diagnostic mammography and left breast ultrasonography of the breast Center. This was the patient's first ever mammogram. In the area of concern in the left breast there was suspicious pleomorphic calcifications spanning 1.1 cm. There was no discrete mammographic mass in this dense breasts (category C.). On physical exam, there was a palpable firm at 1.5 cm mass at the 3:00 position in the left breast. By ultrasound this was irregular and hypoechoic and measured 1.8 cm. Ultrasound of the left axilla was unremarkable. Aside from multiple cysts in the left breast there were no other findings of concern.  On the same day, 09/20/2014, the patient underwent biopsy of the left breast palpable mass. This showed ([id number]) and invasive ductal carcinoma, grade 2, estrogen receptor 100% positive, progesterone receptor 89% positive, both with strong staining intensity, with an MIB-1 of 16%. HER-2 was amplified with a signals ratio of 5.07 and a copy number per cell of 6.85. Paragraph on 09/30/2014 the patient underwent bilateral breast MRIs. This showed a 1.7 cm mass in the upper outer quadrant of the left breast. There was no other suspicious finding  in either breast and no abnormal appearing adenopathy.  The patient's subsequent history is as detailed below.  INTERVAL HISTORY: Nancy Austin today for follow-up of her breast cancer, accompanied by her husband, Nancy Austin. Today is day 1 cycle 2 of 6 planned cycles of carboplatin, docetaxel, trastuzumab and pertuzumab, with neulasta given on day 2 for granulocyte support.   Nancy Austin is doing well today. She has seen an improvement in several side effects. Her rash has cleared with the use of doxycyline daily. Her mouth sores have resolved with the use of magic mouthwash and valacyclovir daily. Her diarrhea stopped after some imodium and questran powder. She denies fevers, chills, nausea, or vomiting. She has some altered taste sensations, but her appetite has improved and she is staying well hydrated.   REVIEW OF SYSTEMS: A detailed review of systems is otherwise noncontributory, except where noted above.   PAST MEDICAL HISTORY: Past Medical History  Diagnosis Date  . Breast cancer of upper-outer quadrant of left female breast 09/24/2014    PAST SURGICAL HISTORY: Past Surgical History  Procedure Laterality Date  . Tonsillectomy  1996  . Portacath placement N/A 10/15/2014    Procedure: INSERTION PORT-A-CATH;  Surgeon: Rolm Bookbinder, MD;  Location: [facility];  Service: General;  Laterality: N/A;    FAMILY HISTORY Family History  Problem Relation Age of Onset  . Skin cancer Father   . Prostate cancer Maternal Uncle 67    currently 79  . Multiple myeloma Paternal Grandmother 26    Deceased 28  The patient's parents are both living. The patient has one brother, no sisters. One grandmother was diagnosed with multiple myeloma at age 71. There is no history of breast  or ovarian cancer in the family.   GYNECOLOGIC HISTORY:  No LMP recorded.  menarche age 70, first live birth age 49. The patient is GX P2. She still having regular periods. She was on birth control pills on and off for the  last 15 years, stopping in October of 2015.   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 Her husband Nancy Austin works as a Immunologist at [employer]. Their children are Sam age 21 and Terrence Dupont age 31.    ADVANCED DIRECTIVES: In place   HEALTH MAINTENANCE: History  Substance Use Topics  . Smoking status: Never Smoker   . Smokeless tobacco: Never Used  . Alcohol Use: 1.8 oz/week    3 Glasses of wine per week     Colonoscopy:  PAP:  November 2014  Bone density:  Lipid panel:  Allergies  Allergen Reactions  . Morphine And Related Nausea And Vomiting    Current Outpatient Prescriptions  Medication Sig Dispense Refill  . dexamethasone (DECADRON) 4 MG tablet Take 2 tablets (8 mg total) by mouth 2 (two) times daily. Start the day before Taxotere. Then again the day after chemo for 3 days. 30 tablet 1  . doxycycline (VIBRA-TABS) 100 MG tablet Take 1 tablet (100 mg total) by mouth 2 (two) times daily. 30 tablet 0  . lidocaine-prilocaine (EMLA) cream Apply 1 application topically as needed. Apply over port area 1-2 hours before chemo, then cover with plastic wrap 30 g 0  . Multiple Vitamin (MULTIVITAMIN WITH MINERALS) TABS tablet Take 1 tablet by mouth every morning.    . ondansetron (ZOFRAN) 8 MG tablet Take 1 tablet (8 mg total) by mouth 2 (two) times daily. Start the day after chemo for 3 days. Then take as needed for nausea or vomiting. 30 tablet 1  . prochlorperazine (COMPAZINE) 10 MG tablet Take 1 tablet (10 mg total) by mouth every 6 (six) hours as needed (Nausea or vomiting). 30 tablet 1  . valACYclovir (VALTREX) 500 MG tablet Take 1 tablet (500 mg total) by mouth daily. 30 tablet 3  . cholestyramine (QUESTRAN) 4 G packet Take 1 packet (4 g total) by mouth 3 (three) times daily with meals. (Patient not taking: Reported on 11/11/2014) 60 each 12  . Diphenhyd-Hydrocort-Nystatin (FIRST-DUKES MOUTHWASH) SUSP Take 5-10 mLs by mouth 4 (four) times daily as needed (pt  may swish spit or swallow). 1 to 1 to 1 ratio (Patient not taking: Reported on 11/11/2014) 240 mL 3  . diphenhydramine-acetaminophen (TYLENOL PM) 25-500 MG TABS Take 1 tablet by mouth at bedtime as needed (sleep).     Marland Kitchen HYDROcodone-acetaminophen (NORCO) 10-325 MG per tablet Take 1 tablet by mouth every 6 (six) hours as needed. (Patient not taking: Reported on 11/11/2014) 20 tablet 0  . LORazepam (ATIVAN) 0.5 MG tablet Take 1 tablet (0.5 mg total) by mouth every 6 (six) hours as needed (Nausea or vomiting). (Patient not taking: Reported on 11/11/2014) 30 tablet 0  . Naproxen Sodium (ALEVE) 220 MG CAPS Take 400 mg by mouth daily as needed (pain.).    Marland Kitchen tobramycin-dexamethasone (TOBRADEX) ophthalmic solution Place 1 drop into both eyes 2 (two) times daily. (Patient not taking: Reported on 11/11/2014) 5 mL 0  . zolpidem (AMBIEN) 5 MG tablet Take 5 mg by mouth at bedtime as needed for sleep.     No current facility-administered medications for this visit.    OBJECTIVE: young white woman  Filed Vitals:   11/11/14 0827  BP:  124/76  Pulse: 63  Temp: 98.8 F (37.1 C)  Resp: 18     Body mass index is 22.41 kg/(m^2).    ECOG FS:1 - Symptomatic but completely ambulatory  Skin: warm, dry  HEENT: sclerae anicteric, conjunctivae pink, oropharynx clear. No thrush or mucositis.  Lymph Nodes: No cervical or supraclavicular lymphadenopathy  Lungs: clear to auscultation bilaterally, no rales, wheezes, or rhonci  Heart: regular rate and rhythm  Abdomen: round, soft, non tender, positive bowel sounds  Musculoskeletal: No focal spinal tenderness, no peripheral edema  Neuro: non focal, well oriented, positive affect  Breasts: deferred  LAB RESULTS:  CMP     Component Value Date/Time   NA 141 11/01/2014 0941   NA 144 10/02/2014 1206   K 3.8 11/01/2014 0941   K 3.9 10/02/2014 1206   CL 104 10/02/2014 1206   CO2 24 11/01/2014 0941   CO2 26 10/02/2014 1206   GLUCOSE 89 11/01/2014 0941   GLUCOSE 103*  10/02/2014 1206   BUN 4.8* 11/01/2014 0941   BUN 11 10/02/2014 1206   CREATININE 0.8 11/01/2014 0941   CREATININE 0.87 10/02/2014 1206   CALCIUM 8.9 11/01/2014 0941   CALCIUM 9.4 10/02/2014 1206   PROT 5.8* 11/01/2014 0941   PROT 7.3 10/02/2014 1206   ALBUMIN 3.4* 11/01/2014 0941   ALBUMIN 3.9 10/02/2014 1206   AST 19 11/01/2014 0941   AST 20 10/02/2014 1206   ALT 28 11/01/2014 0941   ALT 16 10/02/2014 1206   ALKPHOS 70 11/01/2014 0941   ALKPHOS 45 10/02/2014 1206   BILITOT 0.22 11/01/2014 0941   BILITOT 0.4 10/02/2014 1206    I No results found for: SPEP  Lab Results  Component Value Date   WBC 7.6 11/11/2014   NEUTROABS 6.6* 11/11/2014   HGB 12.5 11/11/2014   HCT 38.3 11/11/2014   MCV 88.7 11/11/2014   PLT 286 11/11/2014      Chemistry      Component Value Date/Time   NA 141 11/01/2014 0941   NA 144 10/02/2014 1206   K 3.8 11/01/2014 0941   K 3.9 10/02/2014 1206   CL 104 10/02/2014 1206   CO2 24 11/01/2014 0941   CO2 26 10/02/2014 1206   BUN 4.8* 11/01/2014 0941   BUN 11 10/02/2014 1206   CREATININE 0.8 11/01/2014 0941   CREATININE 0.87 10/02/2014 1206      Component Value Date/Time   CALCIUM 8.9 11/01/2014 0941   CALCIUM 9.4 10/02/2014 1206   ALKPHOS 70 11/01/2014 0941   ALKPHOS 45 10/02/2014 1206   AST 19 11/01/2014 0941   AST 20 10/02/2014 1206   ALT 28 11/01/2014 0941   ALT 16 10/02/2014 1206   BILITOT 0.22 11/01/2014 0941   BILITOT 0.4 10/02/2014 1206       No results found for: LABCA2  No components found for: LABCA125  No results for input(s): INR in the last 168 hours.  Urinalysis No results found for: COLORURINE  STUDIES: Dg Chest Port 1 View  10/15/2014   CLINICAL DATA:  Port-A-Cath placement. Breast cancer of upper outer quadrant of left female breast.  EXAM: PORTABLE CHEST - 1 VIEW  COMPARISON:  None.  FINDINGS: The heart size and mediastinal contours are within normal limits. Both lungs are clear. Right subclavian Port-A-Cath  is noted with distal tip overlying expected position of the SVC. No pneumothorax or pleural effusion is noted. The visualized skeletal structures are unremarkable.  IMPRESSION: No pneumothorax seen status post right subclavian Port-A-Cath placement.  Electronically Signed   By: Sabino Dick M.D.   On: 10/15/2014 09:33   Dg C-arm 1-60 Min-no Report  10/23/2014   : Fluoroscopy was utilized by the requesting physician. No radiographic interpretation.   Electronically Signed   ByPorfirio Mylar   On: 10/23/2014 19:02    ASSESSMENT: 38 y.o. [place] woman s/p Left breast upper outer quadrant biopsy 09/20/2014, for a clinical T1c N0, stage IA invasive ductal carcinoma, grade 2, estrogen and progesterone receptor positive, HER-2 amplified with a signal is ratio of 5.07, and an MIB-1 of 16%  (1) neoadjuvant treatment to consist of carboplatin, docetaxel, trastuzumab and pertuzumab given every 21 days x6, with Neulasta day 2, started 10/21/2014  (2) definitive surgery to follow chemotherapy   (3) trastuzumab to be continued to total one year  (4) radiation to follow surgery if appropriate  (5) antiestrogen is to follow radiation  (6) genetics testing results pending  PLAN: Nancy Austin is doing well today. The labs were reviewed and were entirely stable. We will proceed with day 1, cycle 2 of TCH plus perjeta. She will receive neulasta tomorrow and is scheduled for IV fluids on Friday and Saturday. If she is doing well and staying hydrated, she knows to cancel the IV fluid appointments. We reviewed the use of both imodium and questran, and she is ready if her diarrhea should return.   Nancy Austin will return next Monday for labs and her nadir visit. She understands and agrees with this plan. She knows the goal of treatment in her case is cure. She has been encouraged to call with any issues that might arise before her next visit here.   Nancy Duster, NP   11/11/2014 8:46 AM

## 2014-11-11 NOTE — Patient Instructions (Signed)
[facility] Discharge Instructions for Patients Receiving Chemotherapy  Today you received the following chemotherapy agents Perjeta, Herceptin, Taxotere, Carbo  To help prevent nausea and vomiting after your treatment, we encourage you to take your nausea medication as needed, prescribed If you develop nausea and vomiting that is not controlled by your nausea medication, call the clinic.   BELOW ARE SYMPTOMS THAT SHOULD BE REPORTED IMMEDIATELY:  *FEVER GREATER THAN 100.5 F  *CHILLS WITH OR WITHOUT FEVER  NAUSEA AND VOMITING THAT IS NOT CONTROLLED WITH YOUR NAUSEA MEDICATION  *UNUSUAL SHORTNESS OF BREATH  *UNUSUAL BRUISING OR BLEEDING  TENDERNESS IN MOUTH AND THROAT WITH OR WITHOUT PRESENCE OF ULCERS  *URINARY PROBLEMS  *BOWEL PROBLEMS  UNUSUAL RASH Items with * indicate a potential emergency and should be followed up as soon as possible.  Feel free to call the clinic you have any questions or concerns. The clinic phone number is [phone].

## 2014-11-12 ENCOUNTER — Ambulatory Visit (HOSPITAL_BASED_OUTPATIENT_CLINIC_OR_DEPARTMENT_OTHER): Payer: 59

## 2014-11-12 ENCOUNTER — Encounter: Payer: Self-pay | Admitting: Oncology

## 2014-11-12 ENCOUNTER — Encounter: Payer: Self-pay | Admitting: Pharmacist

## 2014-11-12 DIAGNOSIS — C50812 Malignant neoplasm of overlapping sites of left female breast: Secondary | ICD-10-CM

## 2014-11-12 DIAGNOSIS — Z5189 Encounter for other specified aftercare: Secondary | ICD-10-CM

## 2014-11-12 DIAGNOSIS — C50412 Malignant neoplasm of upper-outer quadrant of left female breast: Secondary | ICD-10-CM

## 2014-11-12 MED ORDER — PEGFILGRASTIM INJECTION 6 MG/0.6ML ~~LOC~~
6.0000 mg | PREFILLED_SYRINGE | Freq: Once | SUBCUTANEOUS | Status: AC
  Administered 2014-11-12: 6 mg via SUBCUTANEOUS
  Filled 2014-11-12: qty 0.6

## 2014-11-12 NOTE — Progress Notes (Signed)
In preparation for chemo verification yesterday (11/30), I s/w pt in the infusion room to confirm she is not pregnant. She stated she had a period last week. I explained we will need monthly urine pregnancy tests per protocol, especially once she is amenorrheic. I have ordered a urine pregnancy test w/ labs on 12/02/14. Kennith Center, Pharm.D., CPP 11/12/2014@10 :56 AM

## 2014-11-12 NOTE — Progress Notes (Signed)
Put husband's fmla form on nurse's desk. °

## 2014-11-12 NOTE — Patient Instructions (Signed)
Pegfilgrastim injection What is this medicine? PEGFILGRASTIM (peg fil GRA stim) is a long-acting granulocyte colony-stimulating factor that stimulates the growth of neutrophils, a type of white blood cell important in the body's fight against infection. It is used to reduce the incidence of fever and infection in patients with certain types of cancer who are receiving chemotherapy that affects the bone marrow. This medicine may be used for other purposes; ask your health care provider or pharmacist if you have questions. COMMON BRAND NAME(S): Neulasta What should I tell my health care provider before I take this medicine? They need to know if you have any of these conditions: -latex allergy -ongoing radiation therapy -sickle cell disease -skin reactions to acrylic adhesives (On-Body Injector only) -an unusual or allergic reaction to pegfilgrastim, filgrastim, other medicines, foods, dyes, or preservatives -pregnant or trying to get pregnant -breast-feeding How should I use this medicine? This medicine is for injection under the skin. If you get this medicine at home, you will be taught how to prepare and give the pre-filled syringe or how to use the On-body Injector. Refer to the patient Instructions for Use for detailed instructions. Use exactly as directed. Take your medicine at regular intervals. Do not take your medicine more often than directed. It is important that you put your used needles and syringes in a special sharps container. Do not put them in a trash can. If you do not have a sharps container, call your pharmacist or healthcare provider to get one. Talk to your pediatrician regarding the use of this medicine in children. Special care may be needed. Overdosage: If you think you have taken too much of this medicine contact a poison control center or emergency room at once. NOTE: This medicine is only for you. Do not share this medicine with others. What if I miss a dose? It is  important not to miss your dose. Call your doctor or health care professional if you miss your dose. If you miss a dose due to an On-body Injector failure or leakage, a new dose should be administered as soon as possible using a single prefilled syringe for manual use. What may interact with this medicine? Interactions have not been studied. Give your health care provider a list of all the medicines, herbs, non-prescription drugs, or dietary supplements you use. Also tell them if you smoke, drink alcohol, or use illegal drugs. Some items may interact with your medicine. This list may not describe all possible interactions. Give your health care provider a list of all the medicines, herbs, non-prescription drugs, or dietary supplements you use. Also tell them if you smoke, drink alcohol, or use illegal drugs. Some items may interact with your medicine. What should I watch for while using this medicine? You may need blood work done while you are taking this medicine. If you are going to need a MRI, CT scan, or other procedure, tell your doctor that you are using this medicine (On-Body Injector only). What side effects may I notice from receiving this medicine? Side effects that you should report to your doctor or health care professional as soon as possible: -allergic reactions like skin rash, itching or hives, swelling of the face, lips, or tongue -dizziness -fever -pain, redness, or irritation at site where injected -pinpoint red spots on the skin -shortness of breath or breathing problems -stomach or side pain, or pain at the shoulder -swelling -tiredness -trouble passing urine Side effects that usually do not require medical attention (report to your doctor   or health care professional if they continue or are bothersome): -bone pain -muscle pain This list may not describe all possible side effects. Call your doctor for medical advice about side effects. You may report side effects to FDA at  1-800-FDA-1088. Where should I keep my medicine? Keep out of the reach of children. Store pre-filled syringes in a refrigerator between 2 and 8 degrees C (36 and 46 degrees F). Do not freeze. Keep in carton to protect from light. Throw away this medicine if it is left out of the refrigerator for more than 48 hours. Throw away any unused medicine after the expiration date. NOTE: This sheet is a summary. It may not cover all possible information. If you have questions about this medicine, talk to your doctor, pharmacist, or health care provider.  2015, Elsevier/Gold Standard. (2014-02-28 16:14:05)  

## 2014-11-13 ENCOUNTER — Other Ambulatory Visit: Payer: Self-pay | Admitting: *Deleted

## 2014-11-13 ENCOUNTER — Encounter: Payer: Self-pay | Admitting: Oncology

## 2014-11-13 ENCOUNTER — Encounter: Payer: Self-pay | Admitting: General Practice

## 2014-11-13 NOTE — Progress Notes (Signed)
Visited with Keyonda's husband Rolla Plate, whom I met in breast clinic, and her father in lobby to check in and offer further support.  Logan reported that he takes comfort in the proactive strategy to give supplemental fluids a couple days after infusion to attempt to mitigate some of the flu-like side effects Constancia experienced after her first round of chemo.  Provided opportunity for him to share and process how their children seem to be coping and how they and family/friends are supporting the children, which was one of family's top concerns in clinic.  Family appreciative of support/encouragement and aware of ongoing chaplain availability, but please also page as needs arise:  [phone].  Thank you.  [place], [place]

## 2014-11-13 NOTE — Progress Notes (Signed)
Faxed husband's fmla form to [employer] @ [phone] °

## 2014-11-15 ENCOUNTER — Other Ambulatory Visit: Payer: 59

## 2014-11-15 ENCOUNTER — Ambulatory Visit (HOSPITAL_BASED_OUTPATIENT_CLINIC_OR_DEPARTMENT_OTHER): Payer: 59

## 2014-11-15 ENCOUNTER — Ambulatory Visit (HOSPITAL_BASED_OUTPATIENT_CLINIC_OR_DEPARTMENT_OTHER): Payer: 59 | Admitting: Nurse Practitioner

## 2014-11-15 DIAGNOSIS — C50412 Malignant neoplasm of upper-outer quadrant of left female breast: Secondary | ICD-10-CM

## 2014-11-15 DIAGNOSIS — R55 Syncope and collapse: Secondary | ICD-10-CM

## 2014-11-15 MED ORDER — HEPARIN SOD (PORK) LOCK FLUSH 100 UNIT/ML IV SOLN
500.0000 [IU] | Freq: Once | INTRAVENOUS | Status: AC | PRN
  Administered 2014-11-15: 500 [IU]
  Filled 2014-11-15: qty 5

## 2014-11-15 MED ORDER — SODIUM CHLORIDE 0.9 % IJ SOLN
10.0000 mL | INTRAMUSCULAR | Status: DC | PRN
  Administered 2014-11-15: 10 mL
  Filled 2014-11-15: qty 10

## 2014-11-15 MED ORDER — SODIUM CHLORIDE 0.9 % IV SOLN
1000.0000 mL | Freq: Once | INTRAVENOUS | Status: AC
  Administered 2014-11-15: 10:00:00 via INTRAVENOUS

## 2014-11-15 NOTE — Progress Notes (Signed)
Accessed patient's port-a-cath without Emla cream today. After accessing and IVF started, patient vagaled. Vital signs taken (see [facility] flowsheets for all vital signs). Blood pressure was 77/45 and pulse was 43. Selena Lesser, NP, came over to assess situation. Blood pressure increased to 101/62 and pulse 61. Patient came back around and feels fine. Denies lightheadedness and dizziness. Room air 02 level was 100 %. Blood pressures taken in left arm. Normal Saline started at 536mls/ hr over two hours. Will continue to monitor. S.Carlita Whitcomb,RN

## 2014-11-15 NOTE — Patient Instructions (Signed)
Dehydration, Adult Dehydration is when you lose more fluids from the body than you take in. Vital organs like the kidneys, brain, and heart cannot function without a proper amount of fluids and salt. Any loss of fluids from the body can cause dehydration.  CAUSES   Vomiting.  Diarrhea.  Excessive sweating.  Excessive urine output.  Fever. SYMPTOMS  Mild dehydration  Thirst.  Dry lips.  Slightly dry mouth. Moderate dehydration  Very dry mouth.  Sunken eyes.  Skin does not bounce back quickly when lightly pinched and released.  Dark urine and decreased urine production.  Decreased tear production.  Headache. Severe dehydration  Very dry mouth.  Extreme thirst.  Rapid, weak pulse (more than 100 beats per minute at rest).  Cold hands and feet.  Not able to sweat in spite of heat and temperature.  Rapid breathing.  Blue lips.  Confusion and lethargy.  Difficulty being awakened.  Minimal urine production.  No tears. DIAGNOSIS  Your caregiver will diagnose dehydration based on your symptoms and your exam. Blood and urine tests will help confirm the diagnosis. The diagnostic evaluation should also identify the cause of dehydration. TREATMENT  Treatment of mild or moderate dehydration can often be done at home by increasing the amount of fluids that you drink. It is best to drink small amounts of fluid more often. Drinking too much at one time can make vomiting worse. Refer to the home care instructions below. Severe dehydration needs to be treated at the hospital where you will probably be given intravenous (IV) fluids that contain water and electrolytes. HOME CARE INSTRUCTIONS   Ask your caregiver about specific rehydration instructions.  Drink enough fluids to keep your urine clear or pale yellow.  Drink small amounts frequently if you have nausea and vomiting.  Eat as you normally do.  Avoid:  Foods or drinks high in sugar.  Carbonated  drinks.  Juice.  Extremely hot or cold fluids.  Drinks with caffeine.  Fatty, greasy foods.  Alcohol.  Tobacco.  Overeating.  Gelatin desserts.  Wash your hands well to avoid spreading bacteria and viruses.  Only take over-the-counter or prescription medicines for pain, discomfort, or fever as directed by your caregiver.  Ask your caregiver if you should continue all prescribed and over-the-counter medicines.  Keep all follow-up appointments with your caregiver. SEEK MEDICAL CARE IF:  You have abdominal pain and it increases or stays in one area (localizes).  You have a rash, stiff neck, or severe headache.  You are irritable, sleepy, or difficult to awaken.  You are weak, dizzy, or extremely thirsty. SEEK IMMEDIATE MEDICAL CARE IF:   You are unable to keep fluids down or you get worse despite treatment.  You have frequent episodes of vomiting or diarrhea.  You have blood or green matter (bile) in your vomit.  You have blood in your stool or your stool looks black and tarry.  You have not urinated in 6 to 8 hours, or you have only urinated a small amount of very dark urine.  You have a fever.  You faint. MAKE SURE YOU:   Understand these instructions.  Will watch your condition.  Will get help right away if you are not doing well or get worse. Document Released: 11/29/2005 Document Revised: 02/21/2012 Document Reviewed: 07/19/2011 Exit[employer] Patient Information 2015 [employer]. This information is not intended to replace advice given to you by your health care provider. Make sure you discuss any questions you have with your health care   provider.  

## 2014-11-16 ENCOUNTER — Ambulatory Visit: Payer: 59

## 2014-11-16 DIAGNOSIS — R55 Syncope and collapse: Secondary | ICD-10-CM | POA: Insufficient documentation

## 2014-11-16 MED ORDER — SODIUM CHLORIDE 0.9 % IV SOLN: Freq: Once | INTRAVENOUS | Status: DC

## 2014-11-16 NOTE — Assessment & Plan Note (Addendum)
Pt experienced a near syncopal event when nurse accessed her portacath to receive IV fluid hydration today.  She typically uses EMLA cream to her port site; but did not today.  She developed some mild dizziness, hypotension, and bradycardia.  BP down to 77/45; and HR down to 43. Iv fluids given wide open; and all symptoms resolved completely. Vitals returned to baseline.  Pt stated that she ate breakfast this morning as well.   Patient was able to complete all of her IV fluid hydration today.  Upon de-accessing her Port-A-Cath-patient once again developed some mild dizziness; and felt faint.  Patient was given some juice; and symptoms did quickly resolve completely.  Patient was driven home by a family member today.

## 2014-11-18 ENCOUNTER — Encounter: Payer: Self-pay | Admitting: Nurse Practitioner

## 2014-11-18 ENCOUNTER — Other Ambulatory Visit: Payer: 59

## 2014-11-18 ENCOUNTER — Ambulatory Visit: Payer: 59 | Admitting: Nurse Practitioner

## 2014-11-18 NOTE — Assessment & Plan Note (Signed)
Patient completed cycle 2 of docetaxel/carboplatin/Herceptin/Perjeta chemotherapy regimen on 11/11/2014.  She has plans to return on 12/02/2014 for cycle 3 of the same regimen.

## 2014-11-18 NOTE — Progress Notes (Signed)
[facility]   SYMPTOM MANAGEMENT CLINIC   HPI: Nancy Austin 38 y.o. female diagnosed with breast cancer.  Currently undergoing docetaxel/carboplatin/Herceptin/Perjeta chemotherapy regimen.  Patient received cycle 2 of her chemotherapy this past Monday, 11/11/2014.  She returned to the [facility] today for IV fluid rehydration.  She typically uses Emla cream to her Port-A-Cath site prior to accessing it.  This morning she forgot to apply the cream; and experienced some mild dizziness, diaphoresis, hypotension and bradycardia upon accessing her Port-A-Cath site.  Fluids were given wide open for a brief amount of time; and all vital signs returned to baseline.  Patient was able to complete her IV fluid rehydration with no further issues.  However, upon removal of the Port-A-Cath needle-patient once again experienced some very mild vagal symptoms which included dizziness and diaphoresis.  She was given juice; and monitored for approximately 30 minutes.  All vitals returned to baseline once again; the patient was cleared to return home with family member.   HPI  CURRENT THERAPY: Upcoming Treatment Dates - BREAST Docetaxel / Carboplatin reduced AUC q21d (Order Herceptin separately) Days with orders from any treatment category:  12/02/2014      SCHEDULING COMMUNICATION      ondansetron (ZOFRAN) IVPB 16 mg      LORazepam (ATIVAN) injection 0.5 mg      Dexamethasone Sodium Phosphate (DECADRON) injection 20 mg      DOCEtaxel (TAXOTERE) 130 mg in dextrose 5 % 250 mL chemo infusion      CARBOplatin (PARAPLATIN) 610.5 mg in sodium chloride 0.9 % 250 mL chemo infusion      tobramycin-dexamethasone (TOBRADEX) ophthalmic solution      lidocaine-prilocaine (EMLA) cream      sodium chloride 0.9 % injection 10 mL      heparin lock flush 100 unit/mL      heparin lock flush 100 unit/mL      alteplase (CATHFLO ACTIVASE) injection 2 mg      sodium chloride 0.9 % injection 3 mL      Cold Pack 1 packet  diphenhydrAMINE (BENADRYL) injection 25 mg      famotidine (PEPCID) IVPB 20 mg      0.9 %  sodium chloride infusion      methylPREDNISolone sodium succinate (SOLU-MEDROL) 125 mg/2 mL injection 125 mg      EPINEPHrine (ADRENALIN) 0.1 MG/ML injection 0.25 mg      EPINEPHrine (ADRENALIN) 0.1 MG/ML injection 0.25 mg      EPINEPHrine (ADRENALIN) injection 0.5 mg      EPINEPHrine (ADRENALIN) injection 0.5 mg      diphenhydrAMINE (BENADRYL) injection 50 mg      albuterol (PROVENTIL) (2.5 MG/3ML) 0.083% nebulizer solution 2.5 mg      0.9 %  sodium chloride infusion      TREATMENT CONDITIONS 12/03/2014      SCHEDULING COMMUNICATION INJECTION      LORazepam (ATIVAN) injection 0.5 mg      tobramycin-dexamethasone (TOBRADEX) ophthalmic solution      lidocaine-prilocaine (EMLA) cream      pegfilgrastim (NEULASTA) injection 6 mg 12/23/2014      SCHEDULING COMMUNICATION      ondansetron (ZOFRAN) IVPB 16 mg      LORazepam (ATIVAN) injection 0.5 mg      Dexamethasone Sodium Phosphate (DECADRON) injection 20 mg      DOCEtaxel (TAXOTERE) 130 mg in dextrose 5 % 250 mL chemo infusion      CARBOplatin (PARAPLATIN) 610.5 mg in sodium chloride 0.9 % 250 mL  chemo infusion      tobramycin-dexamethasone (TOBRADEX) ophthalmic solution      lidocaine-prilocaine (EMLA) cream      sodium chloride 0.9 % injection 10 mL      heparin lock flush 100 unit/mL      heparin lock flush 100 unit/mL      alteplase (CATHFLO ACTIVASE) injection 2 mg      sodium chloride 0.9 % injection 3 mL      Cold Pack 1 packet      diphenhydrAMINE (BENADRYL) injection 25 mg      famotidine (PEPCID) IVPB 20 mg      0.9 %  sodium chloride infusion      methylPREDNISolone sodium succinate (SOLU-MEDROL) 125 mg/2 mL injection 125 mg      EPINEPHrine (ADRENALIN) 0.1 MG/ML injection 0.25 mg      EPINEPHrine (ADRENALIN) 0.1 MG/ML injection 0.25 mg      EPINEPHrine (ADRENALIN) injection 0.5 mg      EPINEPHrine (ADRENALIN) injection 0.5 mg       diphenhydrAMINE (BENADRYL) injection 50 mg      albuterol (PROVENTIL) (2.5 MG/3ML) 0.083% nebulizer solution 2.5 mg      0.9 %  sodium chloride infusion      TREATMENT CONDITIONS    ROS  Past Medical History  Diagnosis Date  . Breast cancer of upper-outer quadrant of left female breast 09/24/2014    Past Surgical History  Procedure Laterality Date  . Tonsillectomy  1996  . Portacath placement N/A 10/15/2014    Procedure: INSERTION PORT-A-CATH;  Surgeon: Rolm Bookbinder, MD;  Location: [facility];  Service: General;  Laterality: N/A;    has Breast cancer of upper-outer quadrant of left female breast and Vasovagal near syncope on her problem list.     is allergic to morphine and related.    Medication List       This list is accurate as of: 11/15/14 11:59 PM.  Always use your most recent med list.               ALEVE 220 MG Caps  Generic drug:  Naproxen Sodium  Take 400 mg by mouth daily as needed (pain.).     cholestyramine 4 G packet  Commonly known as:  QUESTRAN  Take 1 packet (4 g total) by mouth 3 (three) times daily with meals.     dexamethasone 4 MG tablet  Commonly known as:  DECADRON  Take 2 tablets (8 mg total) by mouth 2 (two) times daily. Start the day before Taxotere. Then again the day after chemo for 3 days.     diphenhydramine-acetaminophen 25-500 MG Tabs  Commonly known as:  TYLENOL PM  Take 1 tablet by mouth at bedtime as needed (sleep).     doxycycline 100 MG tablet  Commonly known as:  VIBRA-TABS  Take 1 tablet (100 mg total) by mouth 2 (two) times daily.     FIRST-DUKES MOUTHWASH Susp  Take 5-10 mLs by mouth 4 (four) times daily as needed (pt may swish spit or swallow). 1 to 1 to 1 ratio     HYDROcodone-acetaminophen 10-325 MG per tablet  Commonly known as:  NORCO  Take 1 tablet by mouth every 6 (six) hours as needed.     lidocaine-prilocaine cream  Commonly known as:  EMLA  Apply 1 application topically as needed. Apply over port area  1-2 hours before chemo, then cover with plastic wrap     LORazepam 0.5 MG tablet  Commonly known as:  ATIVAN  Take 1 tablet (0.5 mg total) by mouth every 6 (six) hours as needed (Nausea or vomiting).     multivitamin with minerals Tabs tablet  Take 1 tablet by mouth every morning.     ondansetron 8 MG tablet  Commonly known as:  ZOFRAN  Take 1 tablet (8 mg total) by mouth 2 (two) times daily. Start the day after chemo for 3 days. Then take as needed for nausea or vomiting.     prochlorperazine 10 MG tablet  Commonly known as:  COMPAZINE  Take 1 tablet (10 mg total) by mouth every 6 (six) hours as needed (Nausea or vomiting).     tobramycin-dexamethasone ophthalmic solution  Commonly known as:  TOBRADEX  Place 1 drop into both eyes 2 (two) times daily.     valACYclovir 500 MG tablet  Commonly known as:  VALTREX  Take 1 tablet (500 mg total) by mouth daily.     zolpidem 5 MG tablet  Commonly known as:  AMBIEN  Take 5 mg by mouth at bedtime as needed for sleep.         PHYSICAL EXAMINATION  Vitals:  BP decreased to 77/45; with heart rate 43; but inproved to 112/68 and rate 70 prior to discharge. temo 98.3; sat 100.  Physical Exam  Constitutional: She is oriented to person, place, and time and well-developed, well-nourished, and in no distress.  HENT:  Head: Normocephalic and atraumatic.  Mouth/Throat: Oropharynx is clear and moist.  Eyes: Conjunctivae and EOM are normal. Pupils are equal, round, and reactive to light. Right eye exhibits no discharge. Left eye exhibits no discharge. No scleral icterus.  Neck: Normal range of motion. Neck supple. No JVD present. No tracheal deviation present. No thyromegaly present.  Cardiovascular: Normal rate, regular rhythm, normal heart sounds and intact distal pulses.   Pulmonary/Chest: Effort normal and breath sounds normal. No respiratory distress. She has no wheezes.  Abdominal: Soft. Bowel sounds are normal. She exhibits no  distension. There is no tenderness. There is no rebound.  Musculoskeletal: Normal range of motion. She exhibits no edema or tenderness.  Lymphadenopathy:    She has no cervical adenopathy.  Neurological: She is alert and oriented to person, place, and time. Gait normal.  Skin: Skin is warm and dry. No rash noted. No erythema.  Psychiatric: Affect normal.  Nursing note and vitals reviewed.   LABORATORY DATA:. No visits with results within 3 Day(s) from this visit. Latest known visit with results is:  Appointment on 11/11/2014  Component Date Value Ref Range Status  . WBC 11/11/2014 7.6  3.9 - 10.3 10e3/uL Final  . NEUT# 11/11/2014 6.6* 1.5 - 6.5 10e3/uL Final  . HGB 11/11/2014 12.5  11.6 - 15.9 g/dL Final  . HCT 11/11/2014 38.3  34.8 - 46.6 % Final  . Platelets 11/11/2014 286  145 - 400 10e3/uL Final  . MCV 11/11/2014 88.7  79.5 - 101.0 fL Final  . MCH 11/11/2014 29.0  25.1 - 34.0 pg Final  . MCHC 11/11/2014 32.6  31.5 - 36.0 g/dL Final  . RBC 11/11/2014 4.32  3.70 - 5.45 10e6/uL Final  . RDW 11/11/2014 13.1  11.2 - 14.5 % Final  . lymph# 11/11/2014 0.8* 0.9 - 3.3 10e3/uL Final  . MONO# 11/11/2014 0.2  0.1 - 0.9 10e3/uL Final  . Eosinophils Absolute 11/11/2014 0.0  0.0 - 0.5 10e3/uL Final  . Basophils Absolute 11/11/2014 0.0  0.0 - 0.1 10e3/uL Final  . NEUT% 11/11/2014 85.9* 38.4 - 76.8 %  Final  . LYMPH% 11/11/2014 10.2* 14.0 - 49.7 % Final  . MONO% 11/11/2014 3.0  0.0 - 14.0 % Final  . EOS% 11/11/2014 0.6  0.0 - 7.0 % Final  . BASO% 11/11/2014 0.3  0.0 - 2.0 % Final  . Sodium 11/11/2014 140  136 - 145 mEq/L Final  . Potassium 11/11/2014 3.9  3.5 - 5.1 mEq/L Final  . Chloride 11/11/2014 106  98 - 109 mEq/L Final  . CO2 11/11/2014 23  22 - 29 mEq/L Final  . Glucose 11/11/2014 161* 70 - 140 mg/dl Final  . BUN 11/11/2014 9.0  7.0 - 26.0 mg/dL Final  . Creatinine 11/11/2014 0.7  0.6 - 1.1 mg/dL Final  . Total Bilirubin 11/11/2014 0.54  0.20 - 1.20 mg/dL Final  . Alkaline  Phosphatase 11/11/2014 59  40 - 150 U/L Final  . AST 11/11/2014 21  5 - 34 U/L Final  . ALT 11/11/2014 30  0 - 55 U/L Final  . Total Protein 11/11/2014 6.7  6.4 - 8.3 g/dL Final  . Albumin 11/11/2014 4.0  3.5 - 5.0 g/dL Final  . Calcium 11/11/2014 9.8  8.4 - 10.4 mg/dL Final  . Anion Gap 11/11/2014 11  3 - 11 mEq/L Final     RADIOGRAPHIC STUDIES: No results found.  ASSESSMENT/PLAN:    Vasovagal near syncope Pt experienced a near syncopal event when nurse accessed her portacath to receive IV fluid hydration today.  She typically uses EMLA cream to her port site; but did not today.  She developed some mild dizziness, hypotension, and bradycardia.  BP down to 77/45; and HR down to 43. Iv fluids given wide open; and all symptoms resolved completely. Vitals returned to baseline.  Pt stated that she ate breakfast this morning as well.   Patient was able to complete all of her IV fluid hydration today.  Upon de-accessing her Port-A-Cath-patient once again developed some mild dizziness; and felt faint.  Patient was given some juice; and symptoms did quickly resolve completely.  Patient was driven home by a family member today.  Breast cancer of upper-outer quadrant of left female breast Patient completed cycle 2 of docetaxel/carboplatin/Herceptin/Perjeta chemotherapy regimen on 11/11/2014.  She has plans to return on 12/02/2014 for cycle 3 of the same regimen.   Patient stated understanding of all instructions; and was in agreement with this plan of care. The patient knows to call the clinic with any problems, questions or concerns.   Review/collaboration with Dr. Jana Hakim  regarding all aspects of patient's visit today.   Total time spent with patient  Was 25 minutes;  with greater than 80 percent of that time spent in face to face counseling reher symptoms, frequent monitoring the patient on the infusion area, and coordination of care and follow up.  Disclaimer: This note was dictated with  voice recognition software. Similar sounding words can inadvertently be transcribed and may not be corrected upon review.   Drue Second, NP 11/18/2014

## 2014-11-19 ENCOUNTER — Other Ambulatory Visit: Payer: Self-pay | Admitting: Nurse Practitioner

## 2014-11-20 ENCOUNTER — Encounter: Payer: Self-pay | Admitting: Nurse Practitioner

## 2014-11-20 ENCOUNTER — Other Ambulatory Visit (HOSPITAL_BASED_OUTPATIENT_CLINIC_OR_DEPARTMENT_OTHER): Payer: 59

## 2014-11-20 ENCOUNTER — Ambulatory Visit (HOSPITAL_BASED_OUTPATIENT_CLINIC_OR_DEPARTMENT_OTHER): Payer: 59 | Admitting: Nurse Practitioner

## 2014-11-20 VITALS — BP 112/75 | HR 63 | Temp 98.4°F | Resp 18 | Ht 66.0 in | Wt 140.2 lb

## 2014-11-20 DIAGNOSIS — C50412 Malignant neoplasm of upper-outer quadrant of left female breast: Secondary | ICD-10-CM

## 2014-11-20 DIAGNOSIS — N898 Other specified noninflammatory disorders of vagina: Secondary | ICD-10-CM

## 2014-11-20 DIAGNOSIS — R21 Rash and other nonspecific skin eruption: Secondary | ICD-10-CM

## 2014-11-20 DIAGNOSIS — N920 Excessive and frequent menstruation with regular cycle: Secondary | ICD-10-CM | POA: Insufficient documentation

## 2014-11-20 LAB — CBC WITH DIFFERENTIAL/PLATELET
BASO%: 0.3 % (ref 0.0–2.0)
Basophils Absolute: 0 10*3/uL (ref 0.0–0.1)
EOS%: 0.1 % (ref 0.0–7.0)
Eosinophils Absolute: 0 10*3/uL (ref 0.0–0.5)
HEMATOCRIT: 34.1 % — AB (ref 34.8–46.6)
HGB: 11 g/dL — ABNORMAL LOW (ref 11.6–15.9)
LYMPH#: 2 10*3/uL (ref 0.9–3.3)
LYMPH%: 16.9 % (ref 14.0–49.7)
MCH: 28.5 pg (ref 25.1–34.0)
MCHC: 32.3 g/dL (ref 31.5–36.0)
MCV: 88.3 fL (ref 79.5–101.0)
MONO#: 0.8 10*3/uL (ref 0.1–0.9)
MONO%: 6.4 % (ref 0.0–14.0)
NEUT#: 9.1 10*3/uL — ABNORMAL HIGH (ref 1.5–6.5)
NEUT%: 76.3 % (ref 38.4–76.8)
Platelets: 196 10*3/uL (ref 145–400)
RBC: 3.86 10*6/uL (ref 3.70–5.45)
RDW: 13.1 % (ref 11.2–14.5)
WBC: 11.9 10*3/uL — AB (ref 3.9–10.3)

## 2014-11-20 LAB — COMPREHENSIVE METABOLIC PANEL (CC13)
ALBUMIN: 3.5 g/dL (ref 3.5–5.0)
ALT: 60 U/L — AB (ref 0–55)
ANION GAP: 8 meq/L (ref 3–11)
AST: 32 U/L (ref 5–34)
Alkaline Phosphatase: 81 U/L (ref 40–150)
BUN: 5.4 mg/dL — ABNORMAL LOW (ref 7.0–26.0)
CHLORIDE: 104 meq/L (ref 98–109)
CO2: 28 meq/L (ref 22–29)
CREATININE: 0.7 mg/dL (ref 0.6–1.1)
Calcium: 9.1 mg/dL (ref 8.4–10.4)
EGFR: >90 mL/min/{1.73_m2} (ref >90)
Glucose: 95 mg/dl (ref 70–140)
POTASSIUM: 3.7 meq/L (ref 3.5–5.1)
SODIUM: 140 meq/L (ref 136–145)
Total Bilirubin: 0.2 mg/dL (ref 0.20–1.20)
Total Protein: 5.8 g/dL — ABNORMAL LOW (ref 6.4–8.3)

## 2014-11-20 NOTE — Progress Notes (Addendum)
[facility]  Telephone:([phone] [phone]     ID: Nancy Austin DOB: 1976-08-25  MR#: [id number]  JYN#:[id number]  Patient Care Team: Logan Bores, MD as PCP - General (Obstetrics and Gynecology) Rolm Bookbinder, MD as Consulting Physician (General Surgery) Blair Promise, MD as Consulting Physician (Radiation Oncology) Chauncey Cruel, MD as Consulting Physician (Oncology) OTHER MD:  CHIEF COMPLAINT: Triple positive breast cancer  CURRENT TREATMENT: neoadjuvant chemotherapy and anti-HER-2 immunotherapy   BREAST CANCER HISTORY: From the original Intake note:  Nancy Austin herself found a lump in her left breast early October 2015 and brought it to her gynecologist attention. On 09/20/2014 she was set up for bilateral diagnostic mammography and left breast ultrasonography of the breast Center. This was the patient's first ever mammogram. In the area of concern in the left breast there was suspicious pleomorphic calcifications spanning 1.1 cm. There was no discrete mammographic mass in this dense breasts (category C.). On physical exam, there was a palpable firm at 1.5 cm mass at the 3:00 position in the left breast. By ultrasound this was irregular and hypoechoic and measured 1.8 cm. Ultrasound of the left axilla was unremarkable. Aside from multiple cysts in the left breast there were no other findings of concern.  On the same day, 09/20/2014, the patient underwent biopsy of the left breast palpable mass. This showed ([id number] [id number]) and invasive ductal carcinoma, grade 2, estrogen receptor 100% positive, progesterone receptor 89% positive, both with strong staining intensity, with an MIB-1 of 16%. HER-2 was amplified with a signals ratio of 5.07 and a copy number per cell of 6.85. Paragraph on 09/30/2014 the patient underwent bilateral breast MRIs. This showed a 1.7 cm mass in the upper outer quadrant of the left breast. There was no other suspicious finding  in either breast and no abnormal appearing adenopathy.  The patient's subsequent history is as detailed below.  INTERVAL HISTORY: Nancy Austin teturns today for follow-up of her breast cancer. Today is day 10 cycle 2 of 6 planned cycles of carboplatin, docetaxel, trastuzumab and pertuzumab, with neulasta given on day 2 for granulocyte support.    Nancy Austin returned for 1L of NS this Monday and experienced 2 vasovagal near syncopal episodes when her port was both accessed and deaccessed. She forgot her EMLA cream that day and while the needle itself did not hurt, she had lots of anxiety surrounding these procedures. Her blood pressure and pulse dropped, but the vitals returned to baseline after the fluids were completed. Otherwise, Nancy Austin feels that this cycle of chemo went better than the first. She denies fevers, chills, nausea, or vomiting. She was constipated initially, but now she is experiencing loose bowel movements. She his able to manage these on her own without the use of questran powder or imodium. Her appetite is fair, though she can't taste anything and that is frustrating. Her energy level has actually improved. She has a mildly pruritic rash to her chest alone, and believes this is from the tegaderm covering the port. She has no mouth sores or rashes. She had a period the week before last and then some spotting last week. She has had no bleeding this week.   REVIEW OF SYSTEMS: A detailed review of systems is otherwise noncontributory, except where noted above.   PAST MEDICAL HISTORY: Past Medical History  Diagnosis Date  . Breast cancer of upper-outer quadrant of left female breast 09/24/2014    PAST SURGICAL HISTORY: Past Surgical History  Procedure Laterality Date  .  Tonsillectomy  1996  . Portacath placement N/A 10/15/2014    Procedure: INSERTION PORT-A-CATH;  Surgeon: Rolm Bookbinder, MD;  Location: [facility];  Service: General;  Laterality: N/A;    FAMILY HISTORY Family History    Problem Relation Age of Onset  . Skin cancer Father   . Prostate cancer Maternal Uncle 86    currently 33  . Multiple myeloma Paternal Grandmother 39    Deceased 66  The patient's parents are both living. The patient has one brother, no sisters. One grandmother was diagnosed with multiple myeloma at age 63. There is no history of breast or ovarian cancer in the family.   GYNECOLOGIC HISTORY:  No LMP recorded.  menarche age 9, first live birth age 14. The patient is GX P2. She still having regular periods. She was on birth control pills on and off for the last 15 years, stopping in October of 2015.   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 Her husband Nancy Austin works as a Immunologist at [employer]. Their children are Nancy Austin age 1 and Nancy Austin age 37.    ADVANCED DIRECTIVES: In place   HEALTH MAINTENANCE: History  Substance Use Topics  . Smoking status: Never Smoker   . Smokeless tobacco: Never Used  . Alcohol Use: 1.8 oz/week    3 Glasses of wine per week     Colonoscopy:  PAP:  November 2014  Bone density:  Lipid panel:  Allergies  Allergen Reactions  . Morphine And Related Nausea And Vomiting    Current Outpatient Prescriptions  Medication Sig Dispense Refill  . LORazepam (ATIVAN) 0.5 MG tablet Take 1 tablet (0.5 mg total) by mouth every 6 (six) hours as needed (Nausea or vomiting). 30 tablet 0  . Multiple Vitamin (MULTIVITAMIN WITH MINERALS) TABS tablet Take 1 tablet by mouth every morning.    . Naproxen Sodium (ALEVE) 220 MG CAPS Take 400 mg by mouth daily as needed (pain.).    Marland Kitchen ondansetron (ZOFRAN) 8 MG tablet Take 1 tablet (8 mg total) by mouth 2 (two) times daily. Start the day after chemo for 3 days. Then take as needed for nausea or vomiting. 30 tablet 1  . tobramycin-dexamethasone (TOBRADEX) ophthalmic solution Place 1 drop into both eyes 2 (two) times daily. 5 mL 0  . valACYclovir (VALTREX) 500 MG tablet Take 1 tablet (500 mg total) by  mouth daily. 30 tablet 3  . cholestyramine (QUESTRAN) 4 G packet Take 1 packet (4 g total) by mouth 3 (three) times daily with meals. (Patient not taking: Reported on 11/11/2014) 60 each 12  . dexamethasone (DECADRON) 4 MG tablet Take 2 tablets (8 mg total) by mouth 2 (two) times daily. Start the day before Taxotere. Then again the day after chemo for 3 days. (Patient not taking: Reported on 11/20/2014) 30 tablet 1  . Diphenhyd-Hydrocort-Nystatin (FIRST-DUKES MOUTHWASH) SUSP Take 5-10 mLs by mouth 4 (four) times daily as needed (pt may swish spit or swallow). 1 to 1 to 1 ratio (Patient not taking: Reported on 11/11/2014) 240 mL 3  . diphenhydramine-acetaminophen (TYLENOL PM) 25-500 MG TABS Take 1 tablet by mouth at bedtime as needed (sleep).     Marland Kitchen HYDROcodone-acetaminophen (NORCO) 10-325 MG per tablet Take 1 tablet by mouth every 6 (six) hours as needed. (Patient not taking: Reported on 11/11/2014) 20 tablet 0  . lidocaine-prilocaine (EMLA) cream Apply 1 application topically as needed. Apply over port area 1-2 hours before chemo, then cover with plastic  wrap (Patient not taking: Reported on 11/20/2014) 30 g 0  . prochlorperazine (COMPAZINE) 10 MG tablet Take 1 tablet (10 mg total) by mouth every 6 (six) hours as needed (Nausea or vomiting). (Patient not taking: Reported on 11/20/2014) 30 tablet 1  . zolpidem (AMBIEN) 5 MG tablet Take 5 mg by mouth at bedtime as needed for sleep.     No current facility-administered medications for this visit.   Facility-Administered Medications Ordered in Other Visits  Medication Dose Route Frequency Provider Last Rate Last Dose  . 0.9 %  sodium chloride infusion   Intravenous Once Chauncey Cruel, MD        OBJECTIVE: young white woman  Filed Vitals:   11/20/14 1059  BP: 112/75  Pulse: 63  Temp: 98.4 F (36.9 C)  Resp: 18     Body mass index is 22.64 kg/(m^2).    ECOG FS:1 - Symptomatic but completely ambulatory  Sclerae unicteric, pupils equal and  reactive Oropharynx clear and moist-- no thrush No cervical or supraclavicular adenopathy Lungs no rales or rhonchi Heart regular rate and rhythm Abd soft, nontender, positive bowel sounds MSK no focal spinal tenderness, no upper extremity lymphedema Neuro: nonfocal, well oriented, appropriate affect Breasts: deferred  LAB RESULTS:  CMP     Component Value Date/Time   NA 140 11/20/2014 1033   NA 144 10/02/2014 1206   K 3.7 11/20/2014 1033   K 3.9 10/02/2014 1206   CL 104 10/02/2014 1206   CO2 28 11/20/2014 1033   CO2 26 10/02/2014 1206   GLUCOSE 95 11/20/2014 1033   GLUCOSE 103* 10/02/2014 1206   BUN 5.4* 11/20/2014 1033   BUN 11 10/02/2014 1206   CREATININE 0.7 11/20/2014 1033   CREATININE 0.87 10/02/2014 1206   CALCIUM 9.1 11/20/2014 1033   CALCIUM 9.4 10/02/2014 1206   PROT 5.8* 11/20/2014 1033   PROT 7.3 10/02/2014 1206   ALBUMIN 3.5 11/20/2014 1033   ALBUMIN 3.9 10/02/2014 1206   AST 32 11/20/2014 1033   AST 20 10/02/2014 1206   ALT 60* 11/20/2014 1033   ALT 16 10/02/2014 1206   ALKPHOS 81 11/20/2014 1033   ALKPHOS 45 10/02/2014 1206   BILITOT 0.20 11/20/2014 1033   BILITOT 0.4 10/02/2014 1206    I No results found for: SPEP  Lab Results  Component Value Date   WBC 7.6 11/11/2014   NEUTROABS 6.6* 11/11/2014   HGB 12.5 11/11/2014   HCT 38.3 11/11/2014   MCV 88.7 11/11/2014   PLT 286 11/11/2014      Chemistry      Component Value Date/Time   NA 140 11/20/2014 1033   NA 144 10/02/2014 1206   K 3.7 11/20/2014 1033   K 3.9 10/02/2014 1206   CL 104 10/02/2014 1206   CO2 28 11/20/2014 1033   CO2 26 10/02/2014 1206   BUN 5.4* 11/20/2014 1033   BUN 11 10/02/2014 1206   CREATININE 0.7 11/20/2014 1033   CREATININE 0.87 10/02/2014 1206      Component Value Date/Time   CALCIUM 9.1 11/20/2014 1033   CALCIUM 9.4 10/02/2014 1206   ALKPHOS 81 11/20/2014 1033   ALKPHOS 45 10/02/2014 1206   AST 32 11/20/2014 1033   AST 20 10/02/2014 1206   ALT 60*  11/20/2014 1033   ALT 16 10/02/2014 1206   BILITOT 0.20 11/20/2014 1033   BILITOT 0.4 10/02/2014 1206       No results found for: LABCA2  No components found for: XBDZH299  No results  for input(s): INR in the last 168 hours.  Urinalysis No results found for: COLORURINE  STUDIES: Most recent echocardiogram on 10/07/14 showed an ejection fraction of 60-65%  ASSESSMENT: 38 y.o. Nancy Austin woman s/p Left breast upper outer quadrant biopsy 09/20/2014, for a clinical T1c N0, stage IA invasive ductal carcinoma, grade 2, estrogen and progesterone receptor positive, HER-2 amplified with a signal is ratio of 5.07, and an MIB-1 of 16%  (1) neoadjuvant treatment to consist of carboplatin, docetaxel, trastuzumab and pertuzumab given every 21 days x6, with Neulasta day 2, started 10/21/2014  (2) definitive surgery to follow chemotherapy   (3) trastuzumab to be continued to total one year  (4) radiation to follow surgery if appropriate  (5) antiestrogen is to follow radiation  (6) genetics testing results pending  PLAN: [place] looks and feels well. The labs were reviewed in detail and were entirely stable. She now feels she has "gotten the hang" of chemotherapy and excited to relax these next up coming weeks. She will continue to manage her few symptoms as they arise. She will monitor the mild breakout to her chest, but I agree with her assessment that it is related to tegaderm irritation. She will continue to monitor this spotting. It will likely cease as we continue with chemo treatments. She will alert [place] if this is not the case.  Nancy Austin will return in 2 weeks for the start of cycle 3 of carboplatin, docetaxel, trastuzumab, and pertuzumab. She understands and agrees with this plan. She knows the goal of treatment in her case is cure. She has been encouraged to call with any issues that might arise before her next visit here.   Marcelino Duster, NP   11/20/2014 11:30 AM

## 2014-11-21 ENCOUNTER — Encounter: Payer: Self-pay | Admitting: Oncology

## 2014-11-22 ENCOUNTER — Encounter: Payer: Self-pay | Admitting: Oncology

## 2014-11-23 NOTE — Telephone Encounter (Signed)
none

## 2014-11-26 ENCOUNTER — Other Ambulatory Visit: Payer: Self-pay | Admitting: Oncology

## 2014-11-26 DIAGNOSIS — C50412 Malignant neoplasm of upper-outer quadrant of left female breast: Secondary | ICD-10-CM

## 2014-12-02 ENCOUNTER — Ambulatory Visit (HOSPITAL_BASED_OUTPATIENT_CLINIC_OR_DEPARTMENT_OTHER): Payer: 59

## 2014-12-02 ENCOUNTER — Telehealth: Payer: Self-pay | Admitting: *Deleted

## 2014-12-02 ENCOUNTER — Encounter: Payer: Self-pay | Admitting: Nurse Practitioner

## 2014-12-02 ENCOUNTER — Ambulatory Visit (HOSPITAL_BASED_OUTPATIENT_CLINIC_OR_DEPARTMENT_OTHER): Payer: 59 | Admitting: Nurse Practitioner

## 2014-12-02 ENCOUNTER — Ambulatory Visit (HOSPITAL_BASED_OUTPATIENT_CLINIC_OR_DEPARTMENT_OTHER): Payer: 59 | Admitting: Lab

## 2014-12-02 VITALS — BP 128/81 | HR 68 | Temp 97.7°F | Resp 18 | Ht 66.0 in | Wt 143.8 lb

## 2014-12-02 DIAGNOSIS — C50412 Malignant neoplasm of upper-outer quadrant of left female breast: Secondary | ICD-10-CM

## 2014-12-02 DIAGNOSIS — Z5111 Encounter for antineoplastic chemotherapy: Secondary | ICD-10-CM

## 2014-12-02 DIAGNOSIS — Z5112 Encounter for antineoplastic immunotherapy: Secondary | ICD-10-CM

## 2014-12-02 DIAGNOSIS — Z170 Estrogen receptor positive status [ER+]: Secondary | ICD-10-CM

## 2014-12-02 LAB — CBC WITH DIFFERENTIAL/PLATELET
BASO%: 0.2 % (ref 0.0–2.0)
Basophils Absolute: 0 10*3/uL (ref 0.0–0.1)
EOS ABS: 0 10*3/uL (ref 0.0–0.5)
EOS%: 0 % (ref 0.0–7.0)
HCT: 34.6 % — ABNORMAL LOW (ref 34.8–46.6)
HGB: 11.2 g/dL — ABNORMAL LOW (ref 11.6–15.9)
LYMPH%: 6 % — AB (ref 14.0–49.7)
MCH: 29.2 pg (ref 25.1–34.0)
MCHC: 32.5 g/dL (ref 31.5–36.0)
MCV: 89.9 fL (ref 79.5–101.0)
MONO#: 0.6 10*3/uL (ref 0.1–0.9)
MONO%: 7.6 % (ref 0.0–14.0)
NEUT%: 86.2 % — ABNORMAL HIGH (ref 38.4–76.8)
NEUTROS ABS: 7.3 10*3/uL — AB (ref 1.5–6.5)
PLATELETS: 247 10*3/uL (ref 145–400)
RBC: 3.85 10*6/uL (ref 3.70–5.45)
RDW: 15.7 % — ABNORMAL HIGH (ref 11.2–14.5)
WBC: 8.5 10*3/uL (ref 3.9–10.3)
lymph#: 0.5 10*3/uL — ABNORMAL LOW (ref 0.9–3.3)

## 2014-12-02 LAB — COMPREHENSIVE METABOLIC PANEL (CC13)
ALT: 36 U/L (ref 0–55)
ANION GAP: 11 meq/L (ref 3–11)
AST: 20 U/L (ref 5–34)
Albumin: 3.8 g/dL (ref 3.5–5.0)
Alkaline Phosphatase: 66 U/L (ref 40–150)
BUN: 8.4 mg/dL (ref 7.0–26.0)
CALCIUM: 9.3 mg/dL (ref 8.4–10.4)
CHLORIDE: 107 meq/L (ref 98–109)
CO2: 23 mEq/L (ref 22–29)
Creatinine: 0.7 mg/dL (ref 0.6–1.1)
EGFR: >90 mL/min/{1.73_m2} (ref >90)
Glucose: 118 mg/dl (ref 70–140)
POTASSIUM: 3.9 meq/L (ref 3.5–5.1)
Sodium: 141 mEq/L (ref 136–145)
Total Bilirubin: 0.41 mg/dL (ref 0.20–1.20)
Total Protein: 6.4 g/dL (ref 6.4–8.3)

## 2014-12-02 LAB — PREGNANCY, URINE: Preg Test, Ur: NEGATIVE

## 2014-12-02 MED ORDER — DIPHENHYDRAMINE HCL 25 MG PO CAPS
25.0000 mg | ORAL_CAPSULE | Freq: Once | ORAL | Status: AC
  Administered 2014-12-02: 25 mg via ORAL

## 2014-12-02 MED ORDER — ACETAMINOPHEN 325 MG PO TABS
650.0000 mg | ORAL_TABLET | Freq: Once | ORAL | Status: AC
  Administered 2014-12-02: 650 mg via ORAL

## 2014-12-02 MED ORDER — HEPARIN SOD (PORK) LOCK FLUSH 100 UNIT/ML IV SOLN
500.0000 [IU] | Freq: Once | INTRAVENOUS | Status: AC | PRN
  Administered 2014-12-02: 500 [IU]
  Filled 2014-12-02: qty 5

## 2014-12-02 MED ORDER — SODIUM CHLORIDE 0.9 % IV SOLN
Freq: Once | INTRAVENOUS | Status: AC
  Administered 2014-12-02: 10:00:00 via INTRAVENOUS

## 2014-12-02 MED ORDER — LORAZEPAM 2 MG/ML IJ SOLN
INTRAMUSCULAR | Status: AC
  Filled 2014-12-02: qty 1

## 2014-12-02 MED ORDER — ONDANSETRON 16 MG/50ML IVPB (CHCC)
16.0000 mg | Freq: Once | INTRAVENOUS | Status: AC
  Administered 2014-12-02: 16 mg via INTRAVENOUS

## 2014-12-02 MED ORDER — SODIUM CHLORIDE 0.9 % IV SOLN
420.0000 mg | Freq: Once | INTRAVENOUS | Status: AC
  Administered 2014-12-02: 420 mg via INTRAVENOUS
  Filled 2014-12-02: qty 14

## 2014-12-02 MED ORDER — LORAZEPAM 2 MG/ML IJ SOLN
0.5000 mg | Freq: Once | INTRAMUSCULAR | Status: AC
  Administered 2014-12-02: 0.5 mg via INTRAVENOUS

## 2014-12-02 MED ORDER — SODIUM CHLORIDE 0.9 % IV SOLN
600.0000 mg | Freq: Once | INTRAVENOUS | Status: AC
  Administered 2014-12-02: 600 mg via INTRAVENOUS
  Filled 2014-12-02: qty 60

## 2014-12-02 MED ORDER — DEXAMETHASONE SODIUM PHOSPHATE 20 MG/5ML IJ SOLN
INTRAMUSCULAR | Status: AC
  Filled 2014-12-02: qty 5

## 2014-12-02 MED ORDER — ONDANSETRON 16 MG/50ML IVPB (CHCC)
INTRAVENOUS | Status: AC
  Filled 2014-12-02: qty 16

## 2014-12-02 MED ORDER — ACETAMINOPHEN 325 MG PO TABS
ORAL_TABLET | ORAL | Status: AC
  Filled 2014-12-02: qty 2

## 2014-12-02 MED ORDER — DIPHENHYDRAMINE HCL 25 MG PO CAPS
ORAL_CAPSULE | ORAL | Status: AC
  Filled 2014-12-02: qty 1

## 2014-12-02 MED ORDER — TRASTUZUMAB CHEMO INJECTION 440 MG
6.0000 mg/kg | Freq: Once | INTRAVENOUS | Status: AC
  Administered 2014-12-02: 378 mg via INTRAVENOUS
  Filled 2014-12-02: qty 18

## 2014-12-02 MED ORDER — SODIUM CHLORIDE 0.9 % IJ SOLN
10.0000 mL | INTRAMUSCULAR | Status: DC | PRN
  Administered 2014-12-02: 10 mL
  Filled 2014-12-02: qty 10

## 2014-12-02 MED ORDER — SODIUM CHLORIDE 0.9 % IV SOLN: 610.5000 mg | Freq: Once | INTRAVENOUS | Status: DC

## 2014-12-02 MED ORDER — DEXAMETHASONE SODIUM PHOSPHATE 20 MG/5ML IJ SOLN
20.0000 mg | Freq: Once | INTRAMUSCULAR | Status: AC
  Administered 2014-12-02: 20 mg via INTRAVENOUS

## 2014-12-02 MED ORDER — DEXTROSE 5 % IV SOLN
75.0000 mg/m2 | Freq: Once | INTRAVENOUS | Status: AC
  Administered 2014-12-02: 130 mg via INTRAVENOUS
  Filled 2014-12-02: qty 13

## 2014-12-02 NOTE — Patient Instructions (Signed)
[facility] Discharge Instructions for Patients Receiving Chemotherapy  Today you received the following chemotherapy agents :  Herceptin, Perjeta, Taxotere, Carboplatin.  To help prevent nausea and vomiting after your treatment, we encourage you to take your nausea medication as prescribed by your physician.   If you develop nausea and vomiting that is not controlled by your nausea medication, call the clinic.   BELOW ARE SYMPTOMS THAT SHOULD BE REPORTED IMMEDIATELY:  *FEVER GREATER THAN 100.5 F  *CHILLS WITH OR WITHOUT FEVER  NAUSEA AND VOMITING THAT IS NOT CONTROLLED WITH YOUR NAUSEA MEDICATION  *UNUSUAL SHORTNESS OF BREATH  *UNUSUAL BRUISING OR BLEEDING  TENDERNESS IN MOUTH AND THROAT WITH OR WITHOUT PRESENCE OF ULCERS  *URINARY PROBLEMS  *BOWEL PROBLEMS  UNUSUAL RASH Items with * indicate a potential emergency and should be followed up as soon as possible.  Feel free to call the clinic you have any questions or concerns. The clinic phone number is [phone].    

## 2014-12-02 NOTE — Telephone Encounter (Signed)
Per staff message and POF I have scheduled appts. Advised scheduler of appts. JMW  

## 2014-12-02 NOTE — Progress Notes (Signed)
[place]  Telephone:(336) [phone] [phone]) [phone]     ID: Nancy Austin DOB: Apr 28, 1976  MR#: [id number]  FBX#:[id number]  Patient Care Team: Logan Bores, MD as PCP - General (Obstetrics and Gynecology) Rolm Bookbinder, MD as Consulting Physician (General Surgery) Blair Promise, MD as Consulting Physician (Radiation Oncology) Chauncey Cruel, MD as Consulting Physician (Oncology) OTHER MD:  CHIEF COMPLAINT: Triple positive breast cancer  CURRENT TREATMENT: neoadjuvant chemotherapy and anti-HER-2 immunotherapy   BREAST CANCER HISTORY: From the original Intake note:  Nancy Austin herself found a lump in her left breast early October 2015 and brought it to her gynecologist attention. On 09/20/2014 she was set up for bilateral diagnostic mammography and left breast ultrasonography of the breast Center. This was the patient's first ever mammogram. In the area of concern in the left breast there was suspicious pleomorphic calcifications spanning 1.1 cm. There was no discrete mammographic mass in this dense breasts (category C.). On physical exam, there was a palpable firm at 1.5 cm mass at the 3:00 position in the left breast. By ultrasound this was irregular and hypoechoic and measured 1.8 cm. Ultrasound of the left axilla was unremarkable. Aside from multiple cysts in the left breast there were no other findings of concern.  On the same day, 09/20/2014, the patient underwent biopsy of the left breast palpable mass. This showed ([id number]) and invasive ductal carcinoma, grade 2, estrogen receptor 100% positive, progesterone receptor 89% positive, both with strong staining intensity, with an MIB-1 of 16%. HER-2 was amplified with a signals ratio of 5.07 and a copy number per cell of 6.85. Paragraph on 09/30/2014 the patient underwent bilateral breast MRIs. This showed a 1.7 cm mass in the upper outer quadrant of the left breast. There was no other suspicious finding  in either breast and no abnormal appearing adenopathy.  The patient's subsequent history is as detailed below.  INTERVAL HISTORY: Nancy Austin teturns today for follow-up of her breast cancer, accompanied by her husband. Today is day 1 cycle 3 of 6 planned cycles of carboplatin, docetaxel, trastuzumab and pertuzumab, with neulasta given on day 2 for granulocyte support.    Nancy Austin denies fevers, chills, nausea, vomiting, or changes in bowel or bladder habits. Her appetite is fair though she experiences some taste changes. She had no numbness/tingling or mouth sores. The mildly pruritic rash to her chest, extended to her mid/low back. She took benadryl and it subsided somewhat. She may be sensitive to the tape used for her port. Her energy level is decreased some, but is manageable. She denies shortness of breath, chest pain, cough, or palpitations. She is sleeping well.   REVIEW OF SYSTEMS: A detailed review of systems is otherwise noncontributory, except where noted above.   PAST MEDICAL HISTORY: Past Medical History  Diagnosis Date  . Breast cancer of upper-outer quadrant of left female breast 09/24/2014    PAST SURGICAL HISTORY: Past Surgical History  Procedure Laterality Date  . Tonsillectomy  1996  . Portacath placement N/A 10/15/2014    Procedure: INSERTION PORT-A-CATH;  Surgeon: Rolm Bookbinder, MD;  Location: [facility];  Service: General;  Laterality: N/A;    FAMILY HISTORY Family History  Problem Relation Age of Onset  . Skin cancer Father   . Prostate cancer Maternal Uncle 53    currently 52  . Multiple myeloma Paternal Grandmother 15    Deceased 57  The patient's parents are both living. The patient has one brother, no sisters. One grandmother was diagnosed  with multiple myeloma at age 43. There is no history of breast or ovarian cancer in the family.   GYNECOLOGIC HISTORY:  No LMP recorded.  menarche age 19, first live birth age 78. The patient is GX P2. She still having  regular periods. She was on birth control pills on and off for the last 15 years, stopping in October of 2015.   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 Her husband Nancy Austin works as a Immunologist at [employer]. Their children are Nancy Austin age 54 and Nancy Austin age 18.    ADVANCED DIRECTIVES: In place   HEALTH MAINTENANCE: History  Substance Use Topics  . Smoking status: Never Smoker   . Smokeless tobacco: Never Used  . Alcohol Use: 1.8 oz/week    3 Glasses of wine per week     Colonoscopy:  PAP:  November 2014  Bone density:  Lipid panel:  Allergies  Allergen Reactions  . Morphine And Related Nausea And Vomiting    Current Outpatient Prescriptions  Medication Sig Dispense Refill  . dexamethasone (DECADRON) 4 MG tablet TAKE TWO TABLETS BY MOUTH TWICE DAILY STARTING DAY BEFORE TAXOTERE THEN AGAIN THE DAY AFTER CHEMO FOR 3 DAYS 30 tablet 2  . lidocaine-prilocaine (EMLA) cream Apply 1 application topically as needed. Apply over port area 1-2 hours before chemo, then cover with plastic wrap 30 g 0  . LORazepam (ATIVAN) 0.5 MG tablet Take 1 tablet (0.5 mg total) by mouth every 6 (six) hours as needed (Nausea or vomiting). 30 tablet 0  . Multiple Vitamin (MULTIVITAMIN WITH MINERALS) TABS tablet Take 1 tablet by mouth every morning.    . Naproxen Sodium (ALEVE) 220 MG CAPS Take 400 mg by mouth daily as needed (pain.).    Marland Kitchen ondansetron (ZOFRAN) 8 MG tablet Take 1 tablet (8 mg total) by mouth 2 (two) times daily. Start the day after chemo for 3 days. Then take as needed for nausea or vomiting. 30 tablet 1  . prochlorperazine (COMPAZINE) 10 MG tablet TAKE ONE TABLET BY MOUTH EVERY 6 HOURS AS NEEDED FOR NAUSEA AND VOMITING 30 tablet 2  . tobramycin-dexamethasone (TOBRADEX) ophthalmic solution Place 1 drop into both eyes 2 (two) times daily. 5 mL 0  . valACYclovir (VALTREX) 500 MG tablet Take 1 tablet (500 mg total) by mouth daily. 30 tablet 3  . cholestyramine (QUESTRAN) 4  G packet Take 1 packet (4 g total) by mouth 3 (three) times daily with meals. (Patient not taking: Reported on 11/11/2014) 60 each 12  . Diphenhyd-Hydrocort-Nystatin (FIRST-DUKES MOUTHWASH) SUSP Take 5-10 mLs by mouth 4 (four) times daily as needed (pt may swish spit or swallow). 1 to 1 to 1 ratio (Patient not taking: Reported on 11/11/2014) 240 mL 3  . diphenhydramine-acetaminophen (TYLENOL PM) 25-500 MG TABS Take 1 tablet by mouth at bedtime as needed (sleep).     Marland Kitchen HYDROcodone-acetaminophen (NORCO) 10-325 MG per tablet Take 1 tablet by mouth every 6 (six) hours as needed. (Patient not taking: Reported on 11/11/2014) 20 tablet 0  . zolpidem (AMBIEN) 5 MG tablet Take 5 mg by mouth at bedtime as needed for sleep.     No current facility-administered medications for this visit.   Facility-Administered Medications Ordered in Other Visits  Medication Dose Route Frequency Provider Last Rate Last Dose  . 0.9 %  sodium chloride infusion   Intravenous Once Chauncey Cruel, MD        OBJECTIVE: young white woman  Nancy Austin  Vitals:   12/02/14 0940  BP: 128/81  Pulse: 68  Temp: 97.7 F (36.5 C)  Resp: 18     Body mass index is 23.22 kg/(m^2).    ECOG FS:1 - Symptomatic but completely ambulatory  Skin: warm, dry, mild papular rash to chest and lower/mid back HEENT: sclerae anicteric, conjunctivae pink, oropharynx clear. No thrush or mucositis.  Lymph Nodes: No cervical or supraclavicular lymphadenopathy  Lungs: clear to auscultation bilaterally, no rales, wheezes, or rhonci  Heart: regular rate and rhythm  Abdomen: round, soft, non tender, positive bowel sounds  Musculoskeletal: No focal spinal tenderness, no peripheral edema  Neuro: non focal, well oriented, positive affect  deferred  LAB RESULTS:  CMP     Component Value Date/Time   NA 140 11/20/2014 1033   NA 144 10/02/2014 1206   K 3.7 11/20/2014 1033   K 3.9 10/02/2014 1206   CL 104 10/02/2014 1206   CO2 28 11/20/2014 1033   CO2  26 10/02/2014 1206   GLUCOSE 95 11/20/2014 1033   GLUCOSE 103* 10/02/2014 1206   BUN 5.4* 11/20/2014 1033   BUN 11 10/02/2014 1206   CREATININE 0.7 11/20/2014 1033   CREATININE 0.87 10/02/2014 1206   CALCIUM 9.1 11/20/2014 1033   CALCIUM 9.4 10/02/2014 1206   PROT 5.8* 11/20/2014 1033   PROT 7.3 10/02/2014 1206   ALBUMIN 3.5 11/20/2014 1033   ALBUMIN 3.9 10/02/2014 1206   AST 32 11/20/2014 1033   AST 20 10/02/2014 1206   ALT 60* 11/20/2014 1033   ALT 16 10/02/2014 1206   ALKPHOS 81 11/20/2014 1033   ALKPHOS 45 10/02/2014 1206   BILITOT 0.20 11/20/2014 1033   BILITOT 0.4 10/02/2014 1206    I No results found for: SPEP  Lab Results  Component Value Date   WBC 8.5 12/02/2014   NEUTROABS 7.3* 12/02/2014   HGB 11.2* 12/02/2014   HCT 34.6* 12/02/2014   MCV 89.9 12/02/2014   PLT 247 12/02/2014      Chemistry      Component Value Date/Time   NA 140 11/20/2014 1033   NA 144 10/02/2014 1206   K 3.7 11/20/2014 1033   K 3.9 10/02/2014 1206   CL 104 10/02/2014 1206   CO2 28 11/20/2014 1033   CO2 26 10/02/2014 1206   BUN 5.4* 11/20/2014 1033   BUN 11 10/02/2014 1206   CREATININE 0.7 11/20/2014 1033   CREATININE 0.87 10/02/2014 1206      Component Value Date/Time   CALCIUM 9.1 11/20/2014 1033   CALCIUM 9.4 10/02/2014 1206   ALKPHOS 81 11/20/2014 1033   ALKPHOS 45 10/02/2014 1206   AST 32 11/20/2014 1033   AST 20 10/02/2014 1206   ALT 60* 11/20/2014 1033   ALT 16 10/02/2014 1206   BILITOT 0.20 11/20/2014 1033   BILITOT 0.4 10/02/2014 1206       No results found for: LABCA2  No components found for: LABCA125  No results for input(s): INR in the last 168 hours.  Urinalysis No results found for: COLORURINE  STUDIES: Most recent echocardiogram on 10/07/14 showed an ejection fraction of 60-65%  ASSESSMENT: 38 y.o. [place] woman s/p Left breast upper outer quadrant biopsy 09/20/2014, for a clinical T1c N0, stage IA invasive ductal carcinoma, grade 2,  estrogen and progesterone receptor positive, HER-2 amplified with a signal is ratio of 5.07, and an MIB-1 of 16%  (1) neoadjuvant treatment to consist of carboplatin, docetaxel, trastuzumab and pertuzumab given every 21 days x6, with Neulasta day 2,  started 10/21/2014  (2) definitive surgery to follow chemotherapy   (3) trastuzumab to be continued to total one year  (4) radiation to follow surgery if appropriate  (5) antiestrogen is to follow radiation  (6) genetics testing results pending  PLAN: Nancy Austin is doing well today. The CBC was reviewed in detail and was entirely stable. The CMET was not yet available to review. She will be due for a repeat echocardiogram in late January/early February. We will proceed with cycle 3 of carboplatin, docetaxel, trastuzumab, and pertubumab.   Nancy Austin will return next week for labs and a nadir visit. She is scheduled for IV hydration this Saturday, but knows that she may cancel this appointment if she feels she does not need it. She will need a midpoint breast MRI in the next few weeks. She understands and agrees with this plan. She knows the goal of treatment in her case is cure. She has been encouraged to call with any issues that might arise before her next visit here.   Nancy Duster, NP   12/02/2014 10:01 AM

## 2014-12-03 ENCOUNTER — Ambulatory Visit: Payer: 59

## 2014-12-04 ENCOUNTER — Ambulatory Visit (HOSPITAL_BASED_OUTPATIENT_CLINIC_OR_DEPARTMENT_OTHER): Payer: 59

## 2014-12-04 DIAGNOSIS — C50412 Malignant neoplasm of upper-outer quadrant of left female breast: Secondary | ICD-10-CM

## 2014-12-04 DIAGNOSIS — Z5189 Encounter for other specified aftercare: Secondary | ICD-10-CM

## 2014-12-04 MED ORDER — PEGFILGRASTIM INJECTION 6 MG/0.6ML ~~LOC~~
6.0000 mg | PREFILLED_SYRINGE | Freq: Once | SUBCUTANEOUS | Status: AC
  Administered 2014-12-04: 6 mg via SUBCUTANEOUS
  Filled 2014-12-04: qty 0.6

## 2014-12-04 NOTE — Patient Instructions (Signed)
Pegfilgrastim injection What is this medicine? PEGFILGRASTIM (peg fil GRA stim) is a long-acting granulocyte colony-stimulating factor that stimulates the growth of neutrophils, a type of white blood cell important in the body's fight against infection. It is used to reduce the incidence of fever and infection in patients with certain types of cancer who are receiving chemotherapy that affects the bone marrow. This medicine may be used for other purposes; ask your health care provider or pharmacist if you have questions. COMMON BRAND NAME(S): Neulasta What should I tell my health care provider before I take this medicine? They need to know if you have any of these conditions: -latex allergy -ongoing radiation therapy -sickle cell disease -skin reactions to acrylic adhesives (On-Body Injector only) -an unusual or allergic reaction to pegfilgrastim, filgrastim, other medicines, foods, dyes, or preservatives -pregnant or trying to get pregnant -breast-feeding How should I use this medicine? This medicine is for injection under the skin. If you get this medicine at home, you will be taught how to prepare and give the pre-filled syringe or how to use the On-body Injector. Refer to the patient Instructions for Use for detailed instructions. Use exactly as directed. Take your medicine at regular intervals. Do not take your medicine more often than directed. It is important that you put your used needles and syringes in a special sharps container. Do not put them in a trash can. If you do not have a sharps container, call your pharmacist or healthcare provider to get one. Talk to your pediatrician regarding the use of this medicine in children. Special care may be needed. Overdosage: If you think you have taken too much of this medicine contact a poison control center or emergency room at once. NOTE: This medicine is only for you. Do not share this medicine with others. What if I miss a dose? It is  important not to miss your dose. Call your doctor or health care professional if you miss your dose. If you miss a dose due to an On-body Injector failure or leakage, a new dose should be administered as soon as possible using a single prefilled syringe for manual use. What may interact with this medicine? Interactions have not been studied. Give your health care provider a list of all the medicines, herbs, non-prescription drugs, or dietary supplements you use. Also tell them if you smoke, drink alcohol, or use illegal drugs. Some items may interact with your medicine. This list may not describe all possible interactions. Give your health care provider a list of all the medicines, herbs, non-prescription drugs, or dietary supplements you use. Also tell them if you smoke, drink alcohol, or use illegal drugs. Some items may interact with your medicine. What should I watch for while using this medicine? You may need blood work done while you are taking this medicine. If you are going to need a MRI, CT scan, or other procedure, tell your doctor that you are using this medicine (On-Body Injector only). What side effects may I notice from receiving this medicine? Side effects that you should report to your doctor or health care professional as soon as possible: -allergic reactions like skin rash, itching or hives, swelling of the face, lips, or tongue -dizziness -fever -pain, redness, or irritation at site where injected -pinpoint red spots on the skin -shortness of breath or breathing problems -stomach or side pain, or pain at the shoulder -swelling -tiredness -trouble passing urine Side effects that usually do not require medical attention (report to your doctor   or health care professional if they continue or are bothersome): -bone pain -muscle pain This list may not describe all possible side effects. Call your doctor for medical advice about side effects. You may report side effects to FDA at  1-800-FDA-1088. Where should I keep my medicine? Keep out of the reach of children. Store pre-filled syringes in a refrigerator between 2 and 8 degrees C (36 and 46 degrees F). Do not freeze. Keep in carton to protect from light. Throw away this medicine if it is left out of the refrigerator for more than 48 hours. Throw away any unused medicine after the expiration date. NOTE: This sheet is a summary. It may not cover all possible information. If you have questions about this medicine, talk to your doctor, pharmacist, or health care provider.  2015, Elsevier/Gold Standard. (2014-02-28 16:14:05)  

## 2014-12-07 ENCOUNTER — Ambulatory Visit (HOSPITAL_BASED_OUTPATIENT_CLINIC_OR_DEPARTMENT_OTHER): Payer: 59

## 2014-12-07 DIAGNOSIS — C50412 Malignant neoplasm of upper-outer quadrant of left female breast: Secondary | ICD-10-CM

## 2014-12-07 DIAGNOSIS — C50812 Malignant neoplasm of overlapping sites of left female breast: Secondary | ICD-10-CM

## 2014-12-07 MED ORDER — HEPARIN SOD (PORK) LOCK FLUSH 100 UNIT/ML IV SOLN
500.0000 [IU] | Freq: Once | INTRAVENOUS | Status: AC | PRN
  Administered 2014-12-07: 500 [IU]
  Filled 2014-12-07: qty 5

## 2014-12-07 MED ORDER — SODIUM CHLORIDE 0.9 % IJ SOLN
10.0000 mL | INTRAMUSCULAR | Status: DC | PRN
  Administered 2014-12-07: 10 mL
  Filled 2014-12-07: qty 10

## 2014-12-07 MED ORDER — SODIUM CHLORIDE 0.9 % IV SOLN
1000.0000 mL | Freq: Once | INTRAVENOUS | Status: AC
  Administered 2014-12-07: 09:00:00 via INTRAVENOUS

## 2014-12-07 NOTE — Patient Instructions (Signed)
Dehydration, Adult Dehydration is when you lose more fluids from the body than you take in. Vital organs like the kidneys, brain, and heart cannot function without a proper amount of fluids and salt. Any loss of fluids from the body can cause dehydration.  CAUSES   Vomiting.  Diarrhea.  Excessive sweating.  Excessive urine output.  Fever. SYMPTOMS  Mild dehydration  Thirst.  Dry lips.  Slightly dry mouth. Moderate dehydration  Very dry mouth.  Sunken eyes.  Skin does not bounce back quickly when lightly pinched and released.  Dark urine and decreased urine production.  Decreased tear production.  Headache. Severe dehydration  Very dry mouth.  Extreme thirst.  Rapid, weak pulse (more than 100 beats per minute at rest).  Cold hands and feet.  Not able to sweat in spite of heat and temperature.  Rapid breathing.  Blue lips.  Confusion and lethargy.  Difficulty being awakened.  Minimal urine production.  No tears. DIAGNOSIS  Your caregiver will diagnose dehydration based on your symptoms and your exam. Blood and urine tests will help confirm the diagnosis. The diagnostic evaluation should also identify the cause of dehydration. TREATMENT  Treatment of mild or moderate dehydration can often be done at home by increasing the amount of fluids that you drink. It is best to drink small amounts of fluid more often. Drinking too much at one time can make vomiting worse. Refer to the home care instructions below. Severe dehydration needs to be treated at the hospital where you will probably be given intravenous (IV) fluids that contain water and electrolytes. HOME CARE INSTRUCTIONS   Ask your caregiver about specific rehydration instructions.  Drink enough fluids to keep your urine clear or pale yellow.  Drink small amounts frequently if you have nausea and vomiting.  Eat as you normally do.  Avoid:  Foods or drinks high in sugar.  Carbonated  drinks.  Juice.  Extremely hot or cold fluids.  Drinks with caffeine.  Fatty, greasy foods.  Alcohol.  Tobacco.  Overeating.  Gelatin desserts.  Wash your hands well to avoid spreading bacteria and viruses.  Only take over-the-counter or prescription medicines for pain, discomfort, or fever as directed by your caregiver.  Ask your caregiver if you should continue all prescribed and over-the-counter medicines.  Keep all follow-up appointments with your caregiver. SEEK MEDICAL CARE IF:  You have abdominal pain and it increases or stays in one area (localizes).  You have a rash, stiff neck, or severe headache.  You are irritable, sleepy, or difficult to awaken.  You are weak, dizzy, or extremely thirsty. SEEK IMMEDIATE MEDICAL CARE IF:   You are unable to keep fluids down or you get worse despite treatment.  You have frequent episodes of vomiting or diarrhea.  You have blood or green matter (bile) in your vomit.  You have blood in your stool or your stool looks black and tarry.  You have not urinated in 6 to 8 hours, or you have only urinated a small amount of very dark urine.  You have a fever.  You faint. MAKE SURE YOU:   Understand these instructions.  Will watch your condition.  Will get help right away if you are not doing well or get worse. Document Released: 11/29/2005 Document Revised: 02/21/2012 Document Reviewed: 07/19/2011 Exit[employer] Patient Information 2015 [employer]. This information is not intended to replace advice given to you by your health care provider. Make sure you discuss any questions you have with your health care   provider.  

## 2014-12-09 ENCOUNTER — Ambulatory Visit (HOSPITAL_BASED_OUTPATIENT_CLINIC_OR_DEPARTMENT_OTHER): Payer: 59 | Admitting: Nurse Practitioner

## 2014-12-09 ENCOUNTER — Telehealth: Payer: Self-pay | Admitting: *Deleted

## 2014-12-09 ENCOUNTER — Ambulatory Visit: Payer: 59 | Admitting: Nurse Practitioner

## 2014-12-09 ENCOUNTER — Other Ambulatory Visit (HOSPITAL_BASED_OUTPATIENT_CLINIC_OR_DEPARTMENT_OTHER): Payer: 59

## 2014-12-09 ENCOUNTER — Other Ambulatory Visit: Payer: 59

## 2014-12-09 ENCOUNTER — Telehealth: Payer: Self-pay | Admitting: Nurse Practitioner

## 2014-12-09 ENCOUNTER — Encounter: Payer: Self-pay | Admitting: Nurse Practitioner

## 2014-12-09 VITALS — BP 109/72 | HR 79 | Temp 98.3°F | Resp 20 | Ht 66.0 in | Wt 142.6 lb

## 2014-12-09 DIAGNOSIS — C50412 Malignant neoplasm of upper-outer quadrant of left female breast: Secondary | ICD-10-CM

## 2014-12-09 DIAGNOSIS — R232 Flushing: Secondary | ICD-10-CM

## 2014-12-09 DIAGNOSIS — K5900 Constipation, unspecified: Secondary | ICD-10-CM | POA: Insufficient documentation

## 2014-12-09 DIAGNOSIS — Z170 Estrogen receptor positive status [ER+]: Secondary | ICD-10-CM

## 2014-12-09 LAB — CBC WITH DIFFERENTIAL/PLATELET
BASO%: 0.2 % (ref 0.0–2.0)
BASOS ABS: 0 10*3/uL (ref 0.0–0.1)
EOS%: 0.5 % (ref 0.0–7.0)
Eosinophils Absolute: 0 10*3/uL (ref 0.0–0.5)
HEMATOCRIT: 36.5 % (ref 34.8–46.6)
HEMOGLOBIN: 11.8 g/dL (ref 11.6–15.9)
LYMPH%: 39.4 % (ref 14.0–49.7)
MCH: 29.1 pg (ref 25.1–34.0)
MCHC: 32.4 g/dL (ref 31.5–36.0)
MCV: 89.9 fL (ref 79.5–101.0)
MONO#: 0.6 10*3/uL (ref 0.1–0.9)
MONO%: 16.1 % — AB (ref 0.0–14.0)
NEUT#: 1.5 10*3/uL (ref 1.5–6.5)
NEUT%: 43.8 % (ref 38.4–76.8)
PLATELETS: 153 10*3/uL (ref 145–400)
RBC: 4.06 10*6/uL (ref 3.70–5.45)
RDW: 15.3 % — ABNORMAL HIGH (ref 11.2–14.5)
WBC: 3.5 10*3/uL — ABNORMAL LOW (ref 3.9–10.3)
lymph#: 1.4 10*3/uL (ref 0.9–3.3)

## 2014-12-09 LAB — COMPREHENSIVE METABOLIC PANEL (CC13)
ALBUMIN: 3.7 g/dL (ref 3.5–5.0)
ALT: 31 U/L (ref 0–55)
ANION GAP: 7 meq/L (ref 3–11)
AST: 14 U/L (ref 5–34)
Alkaline Phosphatase: 71 U/L (ref 40–150)
BUN: 12.5 mg/dL (ref 7.0–26.0)
CO2: 30 meq/L — AB (ref 22–29)
Calcium: 8.9 mg/dL (ref 8.4–10.4)
Chloride: 99 mEq/L (ref 98–109)
Creatinine: 0.7 mg/dL (ref 0.6–1.1)
Glucose: 90 mg/dl (ref 70–140)
POTASSIUM: 3.5 meq/L (ref 3.5–5.1)
SODIUM: 137 meq/L (ref 136–145)
TOTAL PROTEIN: 5.9 g/dL — AB (ref 6.4–8.3)
Total Bilirubin: 0.49 mg/dL (ref 0.20–1.20)

## 2014-12-09 MED ORDER — GABAPENTIN 300 MG PO CAPS: 300.0000 mg | ORAL_CAPSULE | Freq: Every day | ORAL | Status: DC

## 2014-12-09 NOTE — Telephone Encounter (Signed)
per pof to sch pt app-sent Vaughan Basta email to pre-cert for [facility] MW email to sch IV-pt to get updated sch 1/11-next visit

## 2014-12-09 NOTE — Progress Notes (Signed)
[place]  Telephone:(336) [phone] Fax:(336) [phone]     ID: Nancy Austin DOB: [birth year]  MR#: [id number]  VEL#:[id number]  Patient Care Team: Logan Bores, MD as PCP - General (Obstetrics and Gynecology) Rolm Bookbinder, MD as Consulting Physician (General Surgery) Blair Promise, MD as Consulting Physician (Radiation Oncology) Chauncey Cruel, MD as Consulting Physician (Oncology) OTHER MD:  CHIEF COMPLAINT: Triple positive breast cancer  CURRENT TREATMENT: neoadjuvant chemotherapy and anti-HER-2 immunotherapy   BREAST CANCER HISTORY: From the original Intake note:  Nancy Austin herself found a lump in her left breast early October 2015 and brought it to her gynecologist attention. On 09/20/2014 she was set up for bilateral diagnostic mammography and left breast ultrasonography of the breast Center. This was the patient's first ever mammogram. In the area of concern in the left breast there was suspicious pleomorphic calcifications spanning 1.1 cm. There was no discrete mammographic mass in this dense breasts (category C.). On physical exam, there was a palpable firm at 1.5 cm mass at the 3:00 position in the left breast. By ultrasound this was irregular and hypoechoic and measured 1.8 cm. Ultrasound of the left axilla was unremarkable. Aside from multiple cysts in the left breast there were no other findings of concern.  On the same day, 09/20/2014, the patient underwent biopsy of the left breast palpable mass. This showed ([id number]) and invasive ductal carcinoma, grade 2, estrogen receptor 100% positive, progesterone receptor 89% positive, both with strong staining intensity, with an MIB-1 of 16%. HER-2 was amplified with a signals ratio of 5.07 and a copy number per cell of 6.85. Paragraph on 09/30/2014 the patient underwent bilateral breast MRIs. This showed a 1.7 cm mass in the upper outer quadrant of the left breast. There was no other suspicious finding  in either breast and no abnormal appearing adenopathy.  The patient's subsequent history is as detailed below.  INTERVAL HISTORY: Nancy Austin teturns today for follow-up of her breast cancer. Today is day 8 cycle 3 of 6 planned cycles of carboplatin, docetaxel, trastuzumab and pertuzumab, with neulasta given on day 2 for granulocyte support.    Nancy Austin denies fevers, chills, nausea or vomiting. She had "extreme constipation" this past week and used one of her children's glycerin suppositories. She was able to have a small soft bowel movement after quite a bit of straining and saw some blood on the toilet paper and in the toilet. This is now improving as well. Her appetite is fair though she experiences some taste changes. She had no numbness/tingling or mouth sores. She is getting more fatigued as the weeks go by. She is starting to have nightly hot flashes that disrupt her sleep. She denies shortness of breath, chest pain, cough, or palpitations.   REVIEW OF SYSTEMS: A detailed review of systems is otherwise noncontributory, except where noted above.   PAST MEDICAL HISTORY: Past Medical History  Diagnosis Date  . Breast cancer of upper-outer quadrant of left female breast 09/24/2014    PAST SURGICAL HISTORY: Past Surgical History  Procedure Laterality Date  . Tonsillectomy  1996  . Portacath placement N/A 10/15/2014    Procedure: INSERTION PORT-A-CATH;  Surgeon: Rolm Bookbinder, MD;  Location: [facility];  Service: General;  Laterality: N/A;    FAMILY HISTORY Family History  Problem Relation Age of Onset  . Skin cancer Father   . Prostate cancer Maternal Uncle 66    currently 76  . Multiple myeloma Paternal Grandmother 9    Deceased 42  The patient's parents are both living. The patient has one brother, no sisters. One grandmother was diagnosed with multiple myeloma at age 3. There is no history of breast or ovarian cancer in the family.   GYNECOLOGIC HISTORY:  No LMP recorded.   menarche age 66, first live birth age 67. The patient is GX P2. She still having regular periods. She was on birth control pills on and off for the last 15 years, stopping in October of 2015.   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 Her husband Rolla Plate works as a Immunologist at [employer]. Their children are Sam age 50 and Terrence Dupont age 88.    ADVANCED DIRECTIVES: In place   HEALTH MAINTENANCE: History  Substance Use Topics  . Smoking status: Never Smoker   . Smokeless tobacco: Never Used  . Alcohol Use: 1.8 oz/week    3 Glasses of wine per week     Colonoscopy:  PAP:  November 2014  Bone density:  Lipid panel:  Allergies  Allergen Reactions  . Morphine And Related Nausea And Vomiting    Current Outpatient Prescriptions  Medication Sig Dispense Refill  . dexamethasone (DECADRON) 4 MG tablet TAKE TWO TABLETS BY MOUTH TWICE DAILY STARTING DAY BEFORE TAXOTERE THEN AGAIN THE DAY AFTER CHEMO FOR 3 DAYS 30 tablet 2  . lidocaine-prilocaine (EMLA) cream Apply 1 application topically as needed. Apply over port area 1-2 hours before chemo, then cover with plastic wrap 30 g 0  . LORazepam (ATIVAN) 0.5 MG tablet Take 1 tablet (0.5 mg total) by mouth every 6 (six) hours as needed (Nausea or vomiting). 30 tablet 0  . Multiple Vitamin (MULTIVITAMIN WITH MINERALS) TABS tablet Take 1 tablet by mouth every morning.    . ondansetron (ZOFRAN) 8 MG tablet Take 1 tablet (8 mg total) by mouth 2 (two) times daily. Start the day after chemo for 3 days. Then take as needed for nausea or vomiting. 30 tablet 1  . prochlorperazine (COMPAZINE) 10 MG tablet TAKE ONE TABLET BY MOUTH EVERY 6 HOURS AS NEEDED FOR NAUSEA AND VOMITING 30 tablet 2  . tobramycin-dexamethasone (TOBRADEX) ophthalmic solution Place 1 drop into both eyes 2 (two) times daily. 5 mL 0  . valACYclovir (VALTREX) 500 MG tablet Take 1 tablet (500 mg total) by mouth daily. 30 tablet 3  . zolpidem (AMBIEN) 5 MG tablet Take  5 mg by mouth at bedtime as needed for sleep.    . cholestyramine (QUESTRAN) 4 G packet Take 1 packet (4 g total) by mouth 3 (three) times daily with meals. (Patient not taking: Reported on 11/11/2014) 60 each 12  . Diphenhyd-Hydrocort-Nystatin (FIRST-DUKES MOUTHWASH) SUSP Take 5-10 mLs by mouth 4 (four) times daily as needed (pt may swish spit or swallow). 1 to 1 to 1 ratio (Patient not taking: Reported on 11/11/2014) 240 mL 3  . diphenhydramine-acetaminophen (TYLENOL PM) 25-500 MG TABS Take 1 tablet by mouth at bedtime as needed (sleep).     . gabapentin (NEURONTIN) 300 MG capsule Take 1 capsule (300 mg total) by mouth at bedtime. 3 capsule 1  . HYDROcodone-acetaminophen (NORCO) 10-325 MG per tablet Take 1 tablet by mouth every 6 (six) hours as needed. (Patient not taking: Reported on 11/11/2014) 20 tablet 0  . Naproxen Sodium (ALEVE) 220 MG CAPS Take 400 mg by mouth daily as needed (pain.).     No current facility-administered medications for this visit.   Facility-Administered Medications Ordered in Other Visits  Medication  Dose Route Frequency Provider Last Rate Last Dose  . 0.9 %  sodium chloride infusion   Intravenous Once Chauncey Cruel, MD        OBJECTIVE: young white woman  Filed Vitals:   12/09/14 1046  BP: 109/72  Pulse: 79  Temp: 98.3 F (36.8 C)  Resp: 20     Body mass index is 23.03 kg/(m^2).    ECOG FS:1 - Symptomatic but completely ambulatory   Skin: warm, dry  HEENT: sclerae anicteric, conjunctivae pink, oropharynx clear. No thrush or mucositis.  Lymph Nodes: No cervical or supraclavicular lymphadenopathy  Lungs: clear to auscultation bilaterally, no rales, wheezes, or rhonci  Heart: regular rate and rhythm  Abdomen: round, soft, non tender, positive bowel sounds  Musculoskeletal: No focal spinal tenderness, no peripheral edema  Neuro: non focal, well oriented, positive affect  Breasts: deferred  LAB RESULTS:  CMP     Component Value Date/Time   NA 141  12/02/2014 0928   NA 144 10/02/2014 1206   K 3.9 12/02/2014 0928   K 3.9 10/02/2014 1206   CL 104 10/02/2014 1206   CO2 23 12/02/2014 0928   CO2 26 10/02/2014 1206   GLUCOSE 118 12/02/2014 0928   GLUCOSE 103* 10/02/2014 1206   BUN 8.4 12/02/2014 0928   BUN 11 10/02/2014 1206   CREATININE 0.7 12/02/2014 0928   CREATININE 0.87 10/02/2014 1206   CALCIUM 9.3 12/02/2014 0928   CALCIUM 9.4 10/02/2014 1206   PROT 6.4 12/02/2014 0928   PROT 7.3 10/02/2014 1206   ALBUMIN 3.8 12/02/2014 0928   ALBUMIN 3.9 10/02/2014 1206   AST 20 12/02/2014 0928   AST 20 10/02/2014 1206   ALT 36 12/02/2014 0928   ALT 16 10/02/2014 1206   ALKPHOS 66 12/02/2014 0928   ALKPHOS 45 10/02/2014 1206   BILITOT 0.41 12/02/2014 0928   BILITOT 0.4 10/02/2014 1206    I No results found for: SPEP  Lab Results  Component Value Date   WBC 3.5* 12/09/2014   NEUTROABS 1.5 12/09/2014   HGB 11.8 12/09/2014   HCT 36.5 12/09/2014   MCV 89.9 12/09/2014   PLT 153 12/09/2014      Chemistry      Component Value Date/Time   NA 141 12/02/2014 0928   NA 144 10/02/2014 1206   K 3.9 12/02/2014 0928   K 3.9 10/02/2014 1206   CL 104 10/02/2014 1206   CO2 23 12/02/2014 0928   CO2 26 10/02/2014 1206   BUN 8.4 12/02/2014 0928   BUN 11 10/02/2014 1206   CREATININE 0.7 12/02/2014 0928   CREATININE 0.87 10/02/2014 1206      Component Value Date/Time   CALCIUM 9.3 12/02/2014 0928   CALCIUM 9.4 10/02/2014 1206   ALKPHOS 66 12/02/2014 0928   ALKPHOS 45 10/02/2014 1206   AST 20 12/02/2014 0928   AST 20 10/02/2014 1206   ALT 36 12/02/2014 0928   ALT 16 10/02/2014 1206   BILITOT 0.41 12/02/2014 0928   BILITOT 0.4 10/02/2014 1206       No results found for: LABCA2  No components found for: LABCA125  No results for input(s): INR in the last 168 hours.  Urinalysis No results found for: COLORURINE  STUDIES: Most recent echocardiogram on 10/07/14 showed an ejection fraction of 60-65%  ASSESSMENT: 38 y.o.  [place] woman s/p Left breast upper outer quadrant biopsy 09/20/2014, for a clinical T1c N0, stage IA invasive ductal carcinoma, grade 2, estrogen and progesterone receptor positive, HER-2 amplified with  a signal is ratio of 5.07, and an MIB-1 of 16%  (1) neoadjuvant treatment to consist of carboplatin, docetaxel, trastuzumab and pertuzumab given every 21 days x6, with Neulasta day 2, started 10/21/2014  (2) definitive surgery to follow chemotherapy   (3) trastuzumab to be continued to total one year  (4) radiation to follow surgery if appropriate  (5) antiestrogen is to follow radiation  (6) genetics testing results pending  PLAN: Kaitlyn is doing moderately well today. The labs were reviewed in detail and were entirely stable.   We discussed her hot flashes and she is interested in beginning gabapentin 335m QHS so I have sent this prescription to her pharmacy. She will begin stool softeners daily with miralax PRN for her constipation. At this time she declined the need for anusol suppositories. She will not strain or sit on the toilet for an extended period of time. She will increase the amount of fiber in her diet and make an effort to stay better hydrated.   I have placed orders for her midpoint breast MRI to be performed in the next 2 weeks and a repeat echocardiogram for late January/early February. She understands and agrees with this plan. She knows the goal of treatment in her case is cure. She has been encouraged to call with any issues that might arise before her next visit here.   FMarcelino Duster NP   12/09/2014 11:16 AM

## 2014-12-09 NOTE — Telephone Encounter (Signed)
Per staff message and POF I have scheduled appts. Advised scheduler of appts. JMW  

## 2014-12-10 ENCOUNTER — Telehealth: Payer: Self-pay | Admitting: Nurse Practitioner

## 2014-12-10 ENCOUNTER — Telehealth: Payer: Self-pay | Admitting: *Deleted

## 2014-12-10 ENCOUNTER — Other Ambulatory Visit: Payer: Self-pay | Admitting: Nurse Practitioner

## 2014-12-10 ENCOUNTER — Other Ambulatory Visit: Payer: Self-pay | Admitting: *Deleted

## 2014-12-10 DIAGNOSIS — C50412 Malignant neoplasm of upper-outer quadrant of left female breast: Secondary | ICD-10-CM

## 2014-12-10 NOTE — Telephone Encounter (Signed)
Pt called earlier today concerning the midpoint MRI that was ordered per Dr. Jana Hakim. Pt was disappointed that an appt to have this MRI could not be done before the end of the year therefore leaving her to pay out of pocket. She stated, "I do not want to have to pay out of pocket again". A repeat MM and ultrasound has been ordered and pt has been made aware of this appt at the [place].

## 2014-12-10 NOTE — Telephone Encounter (Signed)
per Vaughan Basta NPR-sch pt ECHO-*cld pt & gave pt time/date/location

## 2014-12-10 NOTE — Telephone Encounter (Signed)
per pof to sch pt @ [facility] for U& mamma-cld no openings until 1/8-pt needed appt b4 12/31-cld & left pt message to adv no openings-adv HF no openings-adv pt to call us back

## 2014-12-19 ENCOUNTER — Ambulatory Visit
Admission: RE | Admit: 2014-12-19 | Discharge: 2014-12-19 | Disposition: A | Discharge status: Home / Self Care | Payer: 59 | Source: Ambulatory Visit | Attending: Nurse Practitioner | Admitting: Nurse Practitioner

## 2014-12-19 DIAGNOSIS — C50412 Malignant neoplasm of upper-outer quadrant of left female breast: Secondary | ICD-10-CM

## 2014-12-23 ENCOUNTER — Telehealth: Payer: Self-pay | Admitting: Oncology

## 2014-12-23 ENCOUNTER — Other Ambulatory Visit (HOSPITAL_BASED_OUTPATIENT_CLINIC_OR_DEPARTMENT_OTHER): Payer: 59

## 2014-12-23 ENCOUNTER — Ambulatory Visit (HOSPITAL_BASED_OUTPATIENT_CLINIC_OR_DEPARTMENT_OTHER): Payer: 59

## 2014-12-23 ENCOUNTER — Ambulatory Visit (HOSPITAL_BASED_OUTPATIENT_CLINIC_OR_DEPARTMENT_OTHER): Payer: 59 | Admitting: Oncology

## 2014-12-23 VITALS — BP 136/82 | HR 78 | Temp 98.6°F | Resp 18 | Ht 66.0 in | Wt 146.8 lb

## 2014-12-23 DIAGNOSIS — Z5111 Encounter for antineoplastic chemotherapy: Secondary | ICD-10-CM

## 2014-12-23 DIAGNOSIS — C50812 Malignant neoplasm of overlapping sites of left female breast: Secondary | ICD-10-CM

## 2014-12-23 DIAGNOSIS — C50412 Malignant neoplasm of upper-outer quadrant of left female breast: Secondary | ICD-10-CM

## 2014-12-23 DIAGNOSIS — K5900 Constipation, unspecified: Secondary | ICD-10-CM

## 2014-12-23 DIAGNOSIS — Z170 Estrogen receptor positive status [ER+]: Secondary | ICD-10-CM

## 2014-12-23 DIAGNOSIS — R232 Flushing: Secondary | ICD-10-CM

## 2014-12-23 DIAGNOSIS — R55 Syncope and collapse: Secondary | ICD-10-CM

## 2014-12-23 DIAGNOSIS — Z5112 Encounter for antineoplastic immunotherapy: Secondary | ICD-10-CM

## 2014-12-23 DIAGNOSIS — K5909 Other constipation: Secondary | ICD-10-CM

## 2014-12-23 LAB — COMPREHENSIVE METABOLIC PANEL (CC13)
ALBUMIN: 3.8 g/dL (ref 3.5–5.0)
ALT: 27 U/L (ref 0–55)
AST: 23 U/L (ref 5–34)
Alkaline Phosphatase: 64 U/L (ref 40–150)
Anion Gap: 10 mEq/L (ref 3–11)
BILIRUBIN TOTAL: 0.69 mg/dL (ref 0.20–1.20)
BUN: 8.8 mg/dL (ref 7.0–26.0)
CO2: 25 mEq/L (ref 22–29)
CREATININE: 0.8 mg/dL (ref 0.6–1.1)
Calcium: 9.3 mg/dL (ref 8.4–10.4)
Chloride: 106 mEq/L (ref 98–109)
EGFR: >90 mL/min/{1.73_m2} (ref >90)
Glucose: 160 mg/dl — ABNORMAL HIGH (ref 70–140)
POTASSIUM: 3.8 meq/L (ref 3.5–5.1)
SODIUM: 141 meq/L (ref 136–145)
Total Protein: 6.5 g/dL (ref 6.4–8.3)

## 2014-12-23 LAB — CBC WITH DIFFERENTIAL/PLATELET
BASO%: 0.1 % (ref 0.0–2.0)
Basophils Absolute: 0 10*3/uL (ref 0.0–0.1)
EOS%: 0 % (ref 0.0–7.0)
Eosinophils Absolute: 0 10*3/uL (ref 0.0–0.5)
HCT: 37.1 % (ref 34.8–46.6)
HGB: 12 g/dL (ref 11.6–15.9)
LYMPH%: 6.9 % — AB (ref 14.0–49.7)
MCH: 29.3 pg (ref 25.1–34.0)
MCHC: 32.2 g/dL (ref 31.5–36.0)
MCV: 91.1 fL (ref 79.5–101.0)
MONO#: 0.1 10*3/uL (ref 0.1–0.9)
MONO%: 1.1 % (ref 0.0–14.0)
NEUT#: 4 10*3/uL (ref 1.5–6.5)
NEUT%: 91.9 % — ABNORMAL HIGH (ref 38.4–76.8)
Platelets: 255 10*3/uL (ref 145–400)
RBC: 4.08 10*6/uL (ref 3.70–5.45)
RDW: 17.2 % — AB (ref 11.2–14.5)
WBC: 4.4 10*3/uL (ref 3.9–10.3)
lymph#: 0.3 10*3/uL — ABNORMAL LOW (ref 0.9–3.3)

## 2014-12-23 MED ORDER — SODIUM CHLORIDE 0.9 % IV SOLN
600.0000 mg | Freq: Once | INTRAVENOUS | Status: AC
  Administered 2014-12-23: 600 mg via INTRAVENOUS
  Filled 2014-12-23: qty 60

## 2014-12-23 MED ORDER — ACETAMINOPHEN 325 MG PO TABS
650.0000 mg | ORAL_TABLET | Freq: Once | ORAL | Status: AC
  Administered 2014-12-23: 650 mg via ORAL

## 2014-12-23 MED ORDER — TRASTUZUMAB CHEMO INJECTION 440 MG
6.0000 mg/kg | Freq: Once | INTRAVENOUS | Status: AC
  Administered 2014-12-23: 378 mg via INTRAVENOUS
  Filled 2014-12-23: qty 18

## 2014-12-23 MED ORDER — SODIUM CHLORIDE 0.9 % IJ SOLN
10.0000 mL | INTRAMUSCULAR | Status: DC | PRN
  Administered 2014-12-23: 10 mL
  Filled 2014-12-23: qty 10

## 2014-12-23 MED ORDER — DIPHENHYDRAMINE HCL 25 MG PO CAPS
25.0000 mg | ORAL_CAPSULE | Freq: Once | ORAL | Status: AC
  Administered 2014-12-23: 25 mg via ORAL

## 2014-12-23 MED ORDER — ONDANSETRON 16 MG/50ML IVPB (CHCC)
16.0000 mg | Freq: Once | INTRAVENOUS | Status: AC
  Administered 2014-12-23: 16 mg via INTRAVENOUS

## 2014-12-23 MED ORDER — PERTUZUMAB CHEMO INJECTION 420 MG/14ML
420.0000 mg | Freq: Once | INTRAVENOUS | Status: AC
  Administered 2014-12-23: 420 mg via INTRAVENOUS
  Filled 2014-12-23: qty 14

## 2014-12-23 MED ORDER — DOCETAXEL CHEMO INJECTION 160 MG/16ML
75.0000 mg/m2 | Freq: Once | INTRAVENOUS | Status: AC
  Administered 2014-12-23: 130 mg via INTRAVENOUS
  Filled 2014-12-23: qty 13

## 2014-12-23 MED ORDER — DIPHENHYDRAMINE HCL 25 MG PO CAPS
ORAL_CAPSULE | ORAL | Status: AC
  Filled 2014-12-23: qty 1

## 2014-12-23 MED ORDER — LORAZEPAM 2 MG/ML IJ SOLN
INTRAMUSCULAR | Status: AC
  Filled 2014-12-23: qty 1

## 2014-12-23 MED ORDER — DEXAMETHASONE SODIUM PHOSPHATE 20 MG/5ML IJ SOLN
INTRAMUSCULAR | Status: AC
  Filled 2014-12-23: qty 5

## 2014-12-23 MED ORDER — ACETAMINOPHEN 325 MG PO TABS
ORAL_TABLET | ORAL | Status: AC
  Filled 2014-12-23: qty 2

## 2014-12-23 MED ORDER — ONDANSETRON 16 MG/50ML IVPB (CHCC)
INTRAVENOUS | Status: AC
  Filled 2014-12-23: qty 16

## 2014-12-23 MED ORDER — SODIUM CHLORIDE 0.9 % IV SOLN
Freq: Once | INTRAVENOUS | Status: AC
  Administered 2014-12-23: 11:00:00 via INTRAVENOUS

## 2014-12-23 MED ORDER — HEPARIN SOD (PORK) LOCK FLUSH 100 UNIT/ML IV SOLN
500.0000 [IU] | Freq: Once | INTRAVENOUS | Status: AC | PRN
  Administered 2014-12-23: 500 [IU]
  Filled 2014-12-23: qty 5

## 2014-12-23 MED ORDER — DEXAMETHASONE SODIUM PHOSPHATE 20 MG/5ML IJ SOLN
20.0000 mg | Freq: Once | INTRAMUSCULAR | Status: AC
  Administered 2014-12-23: 20 mg via INTRAVENOUS

## 2014-12-23 MED ORDER — LORAZEPAM 2 MG/ML IJ SOLN
0.5000 mg | Freq: Once | INTRAMUSCULAR | Status: AC
  Administered 2014-12-23: 0.5 mg via INTRAVENOUS

## 2014-12-23 NOTE — Patient Instructions (Signed)
[facility] Discharge Instructions for Patients Receiving Chemotherapy  Today you received the following chemotherapy agents TCH/Perj.  To help prevent nausea and vomiting after your treatment, we encourage you to take your nausea medication as directed.    If you develop nausea and vomiting that is not controlled by your nausea medication, call the clinic.   BELOW ARE SYMPTOMS THAT SHOULD BE REPORTED IMMEDIATELY:  *FEVER GREATER THAN 100.5 F  *CHILLS WITH OR WITHOUT FEVER  NAUSEA AND VOMITING THAT IS NOT CONTROLLED WITH YOUR NAUSEA MEDICATION  *UNUSUAL SHORTNESS OF BREATH  *UNUSUAL BRUISING OR BLEEDING  TENDERNESS IN MOUTH AND THROAT WITH OR WITHOUT PRESENCE OF ULCERS  *URINARY PROBLEMS  *BOWEL PROBLEMS  UNUSUAL RASH Items with * indicate a potential emergency and should be followed up as soon as possible.  Feel free to call the clinic you have any questions or concerns. The clinic phone number is [phone].    

## 2014-12-23 NOTE — Progress Notes (Signed)
Nancy Austin  [phone] [phone]) [phone]     ID: Nancy Austin DOB: [birth year]  MR#: [id number]  OEC#:[id number]  Patient Care Team: Logan Bores, MD as PCP - General (Obstetrics and Gynecology) Rolm Bookbinder, MD as Consulting Physician (General Surgery) Blair Promise, MD as Consulting Physician (Radiation Oncology) Chauncey Cruel, MD as Consulting Physician (Oncology) OTHER MD:  CHIEF COMPLAINT: Triple positive breast cancer  CURRENT TREATMENT: neoadjuvant chemotherapy and anti-HER-2 immunotherapy   BREAST CANCER HISTORY: From the original Intake note:  Nancy Austin herself found a lump in her left breast early October 2015 and brought it to her gynecologist attention. On 09/20/2014 she was set up for bilateral diagnostic mammography and left breast ultrasonography of the breast Center. This was the patient's first ever mammogram. In the area of concern in the left breast there was suspicious pleomorphic calcifications spanning 1.1 cm. There was no discrete mammographic mass in this dense breasts (category C.). On physical exam, there was a palpable firm at 1.5 cm mass at the 3:00 position in the left breast. By ultrasound this was irregular and hypoechoic and measured 1.8 cm. Ultrasound of the left axilla was unremarkable. Aside from multiple cysts in the left breast there were no other findings of concern.  On the same day, 09/20/2014, the patient underwent biopsy of the left breast palpable mass. This showed ([phone]) and invasive ductal carcinoma, grade 2, estrogen receptor 100% positive, progesterone receptor 89% positive, both with strong staining intensity, with an MIB-1 of 16%. HER-2 was amplified with a signals ratio of 5.07 and a copy number per cell of 6.85. Paragraph on 09/30/2014 the patient underwent bilateral breast MRIs. This showed a 1.7 cm mass in the upper outer quadrant of the left breast. There was no other suspicious finding  in either breast and no abnormal appearing adenopathy.  The patient's subsequent history is as detailed below.  INTERVAL HISTORY: Nancy Austin teturns today for follow-up of her breast cancer accompanied by her mother. Today is day 1 cycle 4 of 6 planned cycles of carboplatin, docetaxel, trastuzumab and pertuzumab, with neulasta given on day 2 for granulocyte support and IV fluids given on day 5 as needed. --Since her last visit here she had a repeat left mammogram and ultrasound  Which showed a significant response, the measurable disease shrinking from 1.8 cm to 1.0 cm.    REVIEW OF SYSTEMS: She has developed some night sweats and hot flashes and stopped having periods after the first cycle of chemotherapy. She is doing better with gabapentin in the evening. Overall she is tolerating treatments "pretty good" by her own account. She is exercising by walking the first 2 weeks and on the third week she jogs or runs at least 2 miles as tolerated. She did get constipated with cycle 3. She is taking stool softeners but that has not completely resolve the issue. A detailed review of systems today was otherwise stable   PAST MEDICAL HISTORY: Past Medical History  Diagnosis Date  . Breast cancer of upper-outer quadrant of left female breast 09/24/2014    PAST SURGICAL HISTORY: Past Surgical History  Procedure Laterality Date  . Tonsillectomy  1996  . Portacath placement N/A 10/15/2014    Procedure: INSERTION PORT-A-CATH;  Surgeon: Rolm Bookbinder, MD;  Location: [facility];  Service: General;  Laterality: N/A;    FAMILY HISTORY Family History  Problem Relation Age of Onset  . Skin cancer Father   . Prostate cancer Maternal Uncle 60  currently 61  . Multiple myeloma Paternal Grandmother 56    Deceased 63  The patient's parents are both living. The patient has one brother, no sisters. One grandmother was diagnosed with multiple myeloma at age 36. There is no history of breast or ovarian cancer in  the family.   GYNECOLOGIC HISTORY:  No LMP recorded.  menarche age 11, first live birth age 60. The patient is GX P2. She still having regular periods. She was on birth control pills on and off for the last 15 years, stopping in October of 2015.   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 Her husband Rolla Plate works as a Immunologist at [employer]. Their children are Sam age 62 and Terrence Dupont age 25.    ADVANCED DIRECTIVES: In place   HEALTH MAINTENANCE: History  Substance Use Topics  . Smoking status: Never Smoker   . Smokeless tobacco: Never Used  . Alcohol Use: 1.8 oz/week    3 Glasses of wine per week     Colonoscopy:  PAP:  November 2014  Bone density:  Lipid panel:  Allergies  Allergen Reactions  . Morphine And Related Nausea And Vomiting    Current Outpatient Prescriptions  Medication Sig Dispense Refill  . cholestyramine (QUESTRAN) 4 G packet Take 1 packet (4 g total) by mouth 3 (three) times daily with meals. (Patient not taking: Reported on 11/11/2014) 60 each 12  . dexamethasone (DECADRON) 4 MG tablet TAKE TWO TABLETS BY MOUTH TWICE DAILY STARTING DAY BEFORE TAXOTERE THEN AGAIN THE DAY AFTER CHEMO FOR 3 DAYS 30 tablet 2  . Diphenhyd-Hydrocort-Nystatin (FIRST-DUKES MOUTHWASH) SUSP Take 5-10 mLs by mouth 4 (four) times daily as needed (pt may swish spit or swallow). 1 to 1 to 1 ratio (Patient not taking: Reported on 11/11/2014) 240 mL 3  . diphenhydramine-acetaminophen (TYLENOL PM) 25-500 MG TABS Take 1 tablet by mouth at bedtime as needed (sleep).     . gabapentin (NEURONTIN) 300 MG capsule Take 1 capsule (300 mg total) by mouth at bedtime. 30 capsule 1  . HYDROcodone-acetaminophen (NORCO) 10-325 MG per tablet Take 1 tablet by mouth every 6 (six) hours as needed. (Patient not taking: Reported on 11/11/2014) 20 tablet 0  . lidocaine-prilocaine (EMLA) cream Apply 1 application topically as needed. Apply over port area 1-2 hours before chemo, then cover  with plastic wrap 30 g 0  . LORazepam (ATIVAN) 0.5 MG tablet Take 1 tablet (0.5 mg total) by mouth every 6 (six) hours as needed (Nausea or vomiting). 30 tablet 0  . Multiple Vitamin (MULTIVITAMIN WITH MINERALS) TABS tablet Take 1 tablet by mouth every morning.    . Naproxen Sodium (ALEVE) 220 MG CAPS Take 400 mg by mouth daily as needed (pain.).    Marland Kitchen ondansetron (ZOFRAN) 8 MG tablet Take 1 tablet (8 mg total) by mouth 2 (two) times daily. Start the day after chemo for 3 days. Then take as needed for nausea or vomiting. 30 tablet 1  . prochlorperazine (COMPAZINE) 10 MG tablet TAKE ONE TABLET BY MOUTH EVERY 6 HOURS AS NEEDED FOR NAUSEA AND VOMITING 30 tablet 2  . tobramycin-dexamethasone (TOBRADEX) ophthalmic solution Place 1 drop into both eyes 2 (two) times daily. 5 mL 0  . valACYclovir (VALTREX) 500 MG tablet Take 1 tablet (500 mg total) by mouth daily. 30 tablet 3  . zolpidem (AMBIEN) 5 MG tablet Take 5 mg by mouth at bedtime as needed for sleep.     No current  facility-administered medications for this visit.   Facility-Administered Medications Ordered in Other Visits  Medication Dose Route Frequency Provider Last Rate Last Dose  . 0.9 %  sodium chloride infusion   Intravenous Once Chauncey Cruel, MD        OBJECTIVE: young white woman  in no acute distress  Filed Vitals:   12/23/14 0959  BP: 136/82  Pulse: 78  Temp: 98.6 F (37 C)  Resp: 18     Body mass index is 23.71 kg/(m^2).    ECOG FS:1 - Symptomatic but completely ambulatory   Sclerae unicteric, pupils equal and reactive Oropharynx clear and moist-- no thrush or other lesions No cervical or supraclavicular adenopathy Lungs no rales or rhonchi Heart regular rate and rhythm Abd soft, nontender, positive bowel sounds MSK no focal spinal tenderness, no upper extremity lymphedema Neuro: nonfocal, well oriented, appropriate affect Breasts: The right breast is unremarkable. In the left breast I cannot palpate a mass. There  are no skin or nipple changes of concern. The left axilla is benign.   LAB RESULTS:  CMP     Component Value Date/Time   NA 137 12/09/2014 1034   NA 144 10/02/2014 1206   K 3.5 12/09/2014 1034   K 3.9 10/02/2014 1206   CL 104 10/02/2014 1206   CO2 30* 12/09/2014 1034   CO2 26 10/02/2014 1206   GLUCOSE 90 12/09/2014 1034   GLUCOSE 103* 10/02/2014 1206   BUN 12.5 12/09/2014 1034   BUN 11 10/02/2014 1206   CREATININE 0.7 12/09/2014 1034   CREATININE 0.87 10/02/2014 1206   CALCIUM 8.9 12/09/2014 1034   CALCIUM 9.4 10/02/2014 1206   PROT 5.9* 12/09/2014 1034   PROT 7.3 10/02/2014 1206   ALBUMIN 3.7 12/09/2014 1034   ALBUMIN 3.9 10/02/2014 1206   AST 14 12/09/2014 1034   AST 20 10/02/2014 1206   ALT 31 12/09/2014 1034   ALT 16 10/02/2014 1206   ALKPHOS 71 12/09/2014 1034   ALKPHOS 45 10/02/2014 1206   BILITOT 0.49 12/09/2014 1034   BILITOT 0.4 10/02/2014 1206    I No results found for: SPEP  Lab Results  Component Value Date   WBC 4.4 12/23/2014   NEUTROABS 4.0 12/23/2014   HGB 12.0 12/23/2014   HCT 37.1 12/23/2014   MCV 91.1 12/23/2014   PLT 255 12/23/2014      Chemistry      Component Value Date/Time   NA 137 12/09/2014 1034   NA 144 10/02/2014 1206   K 3.5 12/09/2014 1034   K 3.9 10/02/2014 1206   CL 104 10/02/2014 1206   CO2 30* 12/09/2014 1034   CO2 26 10/02/2014 1206   BUN 12.5 12/09/2014 1034   BUN 11 10/02/2014 1206   CREATININE 0.7 12/09/2014 1034   CREATININE 0.87 10/02/2014 1206      Component Value Date/Time   CALCIUM 8.9 12/09/2014 1034   CALCIUM 9.4 10/02/2014 1206   ALKPHOS 71 12/09/2014 1034   ALKPHOS 45 10/02/2014 1206   AST 14 12/09/2014 1034   AST 20 10/02/2014 1206   ALT 31 12/09/2014 1034   ALT 16 10/02/2014 1206   BILITOT 0.49 12/09/2014 1034   BILITOT 0.4 10/02/2014 1206       No results found for: LABCA2  No components found for: LABCA125  No results for input(s): INR in the last 168 hours.  Urinalysis No results  found for: COLORURINE  STUDIES: Mm Digital Diagnostic Unilat L  12/19/2014   CLINICAL DATA:  Patient  with recent diagnosis left breast malignancy. Currently on adjuvant chemotherapy.  EXAM: DIGITAL DIAGNOSTIC  LEFT MAMMOGRAM WITH CAD  ULTRASOUND LEFT BREAST  COMPARISON:  [facility]  ACR Breast Density Category c: The breast tissue is heterogeneously dense, which may obscure small masses.  FINDINGS: Re- demonstrated 9 mm of pleomorphic calcifications within the lateral aspect of the left breast. Biopsy marking clip adjacent to the calcifications is demonstrated. Stable oval circumscribed mass 12 o'clock position left breast, previously evaluated and found to be cyst.  Mammographic images were processed with CAD.  On physical exam, I palpate no discrete mass within the lateral left breast.  Ultrasound is performed, showing a 0.8 x 0.6 x 1.0 cm irregular hypoechoic mass within the left breast 2 o'clock position 3 cm from the nipple. This is less conspicuous and decreased in size when compared to prior examination were it measured 1.8 x 1.1 x 1.7 cm.  IMPRESSION: Interval decrease in size of known left breast malignancy. Persistent associated pleomorphic calcifications.  RECOMMENDATION: Continue treatment plan for known left breast malignancy.  I have discussed the findings and recommendations with the patient. Results were also provided in writing at the conclusion of the visit. If applicable, a reminder letter will be sent to the patient regarding the next appointment.  BI-RADS CATEGORY  6: Known biopsy-proven malignancy.   Electronically Signed   By: Lovey Newcomer M.D.   On: 12/19/2014 09:32   US Breast Ltd Uni Left Inc Axilla  12/19/2014   CLINICAL DATA:  Patient with recent diagnosis left breast malignancy. Currently on adjuvant chemotherapy.  EXAM: DIGITAL DIAGNOSTIC  LEFT MAMMOGRAM WITH CAD  ULTRASOUND LEFT BREAST  COMPARISON:  [facility]  ACR Breast Density Category c: The breast tissue is heterogeneously dense,  which may obscure small masses.  FINDINGS: Re- demonstrated 9 mm of pleomorphic calcifications within the lateral aspect of the left breast. Biopsy marking clip adjacent to the calcifications is demonstrated. Stable oval circumscribed mass 12 o'clock position left breast, previously evaluated and found to be cyst.  Mammographic images were processed with CAD.  On physical exam, I palpate no discrete mass within the lateral left breast.  Ultrasound is performed, showing a 0.8 x 0.6 x 1.0 cm irregular hypoechoic mass within the left breast 2 o'clock position 3 cm from the nipple. This is less conspicuous and decreased in size when compared to prior examination were it measured 1.8 x 1.1 x 1.7 cm.  IMPRESSION: Interval decrease in size of known left breast malignancy. Persistent associated pleomorphic calcifications.  RECOMMENDATION: Continue treatment plan for known left breast malignancy.  I have discussed the findings and recommendations with the patient. Results were also provided in writing at the conclusion of the visit. If applicable, a reminder letter will be sent to the patient regarding the next appointment.  BI-RADS CATEGORY  6: Known biopsy-proven malignancy.   Electronically Signed   By: Lovey Newcomer M.D.   On: 12/19/2014 09:32     ASSESSMENT: 39 y.o. BRCA negative [place] woman s/p Left breast upper outer quadrant biopsy 09/20/2014, for a clinical T1c N0, stage IA invasive ductal carcinoma, grade 2, estrogen and progesterone receptor positive, HER-2 amplified with a signal is ratio of 5.07, and an MIB-1 of 16%  (1) neoadjuvant treatment to consist of carboplatin, docetaxel, trastuzumab and pertuzumab given every 21 days x6, with Neulasta day 2, started 10/21/2014, scheduled to be completed 02/03/2015  (2) definitive surgery to follow chemotherapy   (3) trastuzumab to be continued to total one year  (  a) most recent echocardiogram 10/07/2014 showed an ejection fraction in the 60-65% range    (4) radiation to follow surgery if appropriate  (5) antiestrogens to follow radiation  (6)  the Breast[employer] geneprofile  ([facility] genetics) obtained November 2015 did not reveal a mutation in ATM, BARD1, BRCA1, BRCA BRIP1, CDH1, CHEK2, MRE11A, MUTYH, NBN, NF1, PALB2, PTEN, RAD50, RAD51C, RAD51D, or TP53  PLAN: Armine is having a good response to her treatment. The cancer has shrunken approximately by half according to her ultrasound report. We are proceeding with the fourth of 6 planned cycles and she will receive her final cycle February 22.  I am setting her up for a breast MRI March 1 and to see me March 4 to discuss results. She will probably be seeing Dr. Donne Hazel are about the same time and my suspicion is that she will probably have her definitive surgery the second week of March or so.  Most likely the Zofran is what is causing her constipation. If she can have the nausea well controlled without that that would be fine.  Her periods stopped after her first chemotherapy cycle. If her periods were to resume after chemotherapy she will need to have ovarian suppression.  We also discuss her genetic results which were very favorable.  Nancy Austin has a good understanding of the overall plan. She agrees with it. She knows the goal of treatment in her case is cure. She has been encouraged to call with any issues that might arise before her next visit here.   Chauncey Cruel, MD   12/23/2014 10:01 AM

## 2014-12-23 NOTE — Telephone Encounter (Signed)
Gave avs & cal for Jan-March. Sent mess to sch tx. Sent mess to [facility] to change order to [facility] due to pt husband in the [facility] system.

## 2014-12-24 ENCOUNTER — Ambulatory Visit: Payer: 59

## 2014-12-24 NOTE — Addendum Note (Signed)
Addended by: Jaci Carrel A on: 12/24/2014 10:39 AM   Modules accepted: Orders, Medications

## 2014-12-25 ENCOUNTER — Ambulatory Visit (HOSPITAL_BASED_OUTPATIENT_CLINIC_OR_DEPARTMENT_OTHER): Payer: 59

## 2014-12-25 DIAGNOSIS — C50412 Malignant neoplasm of upper-outer quadrant of left female breast: Secondary | ICD-10-CM

## 2014-12-25 DIAGNOSIS — Z5189 Encounter for other specified aftercare: Secondary | ICD-10-CM

## 2014-12-25 DIAGNOSIS — C50812 Malignant neoplasm of overlapping sites of left female breast: Secondary | ICD-10-CM

## 2014-12-25 MED ORDER — PEGFILGRASTIM INJECTION 6 MG/0.6ML ~~LOC~~
6.0000 mg | PREFILLED_SYRINGE | Freq: Once | SUBCUTANEOUS | Status: AC
  Administered 2014-12-25: 6 mg via SUBCUTANEOUS
  Filled 2014-12-25: qty 0.6

## 2014-12-27 ENCOUNTER — Ambulatory Visit (HOSPITAL_BASED_OUTPATIENT_CLINIC_OR_DEPARTMENT_OTHER): Payer: 59

## 2014-12-27 DIAGNOSIS — C50412 Malignant neoplasm of upper-outer quadrant of left female breast: Secondary | ICD-10-CM

## 2014-12-27 DIAGNOSIS — C50812 Malignant neoplasm of overlapping sites of left female breast: Secondary | ICD-10-CM

## 2014-12-27 MED ORDER — SODIUM CHLORIDE 0.9 % IJ SOLN
10.0000 mL | INTRAMUSCULAR | Status: DC | PRN
  Administered 2014-12-27: 10 mL
  Filled 2014-12-27: qty 10

## 2014-12-27 MED ORDER — HEPARIN SOD (PORK) LOCK FLUSH 100 UNIT/ML IV SOLN
500.0000 [IU] | Freq: Once | INTRAVENOUS | Status: AC | PRN
  Administered 2014-12-27: 500 [IU]
  Filled 2014-12-27: qty 5

## 2014-12-27 MED ORDER — SODIUM CHLORIDE 0.9 % IV SOLN
Freq: Once | INTRAVENOUS | Status: AC
  Administered 2014-12-27: 13:00:00 via INTRAVENOUS

## 2014-12-27 NOTE — Patient Instructions (Signed)

## 2014-12-29 ENCOUNTER — Other Ambulatory Visit: Payer: Self-pay | Admitting: Oncology

## 2014-12-29 DIAGNOSIS — C50919 Malignant neoplasm of unspecified site of unspecified female breast: Secondary | ICD-10-CM

## 2015-01-07 ENCOUNTER — Telehealth: Payer: Self-pay | Admitting: *Deleted

## 2015-01-07 NOTE — Telephone Encounter (Signed)
Spoke to pt about schedule and tolerance of chemo. Confirmed echo and Cycle 5 of TCHP. Pt tolerating well. Does have slightly more fatigue than previous cycles. Pt did note a small amount of numbness/tingling in great toe. Informed pt to ensure she shares that information with Heather at her next appt prior to chemo. Received verbal understanding. Pt denies further needs at this time. I will share pt information with Heather concerning tingling in large toe as well.

## 2015-01-08 ENCOUNTER — Other Ambulatory Visit: Payer: Self-pay | Admitting: Nurse Practitioner

## 2015-01-08 ENCOUNTER — Encounter: Payer: Self-pay | Admitting: Oncology

## 2015-01-08 DIAGNOSIS — C50919 Malignant neoplasm of unspecified site of unspecified female breast: Secondary | ICD-10-CM

## 2015-01-10 ENCOUNTER — Ambulatory Visit (HOSPITAL_COMMUNITY)
Admission: RE | Admit: 2015-01-10 | Discharge: 2015-01-10 | Disposition: A | Discharge status: Home / Self Care | Payer: 59 | Source: Ambulatory Visit | Attending: Oncology | Admitting: Oncology

## 2015-01-10 DIAGNOSIS — C50412 Malignant neoplasm of upper-outer quadrant of left female breast: Secondary | ICD-10-CM | POA: Insufficient documentation

## 2015-01-10 DIAGNOSIS — I517 Cardiomegaly: Secondary | ICD-10-CM

## 2015-01-10 DIAGNOSIS — R55 Syncope and collapse: Secondary | ICD-10-CM | POA: Diagnosis not present

## 2015-01-10 NOTE — Progress Notes (Signed)
*  PRELIMINARY RESULTS* Echocardiogram 2D Echocardiogram has been performed.  Nancy Austin 01/10/2015, 10:36 AM

## 2015-01-13 ENCOUNTER — Telehealth: Payer: Self-pay | Admitting: Oncology

## 2015-01-13 ENCOUNTER — Ambulatory Visit (HOSPITAL_BASED_OUTPATIENT_CLINIC_OR_DEPARTMENT_OTHER): Payer: 59

## 2015-01-13 ENCOUNTER — Ambulatory Visit (HOSPITAL_BASED_OUTPATIENT_CLINIC_OR_DEPARTMENT_OTHER): Payer: 59 | Admitting: Nurse Practitioner

## 2015-01-13 ENCOUNTER — Other Ambulatory Visit (HOSPITAL_BASED_OUTPATIENT_CLINIC_OR_DEPARTMENT_OTHER): Payer: 59

## 2015-01-13 ENCOUNTER — Encounter: Payer: Self-pay | Admitting: Nurse Practitioner

## 2015-01-13 VITALS — BP 116/74 | HR 71 | Temp 98.9°F | Resp 18 | Ht 66.0 in | Wt 149.5 lb

## 2015-01-13 DIAGNOSIS — C50812 Malignant neoplasm of overlapping sites of left female breast: Secondary | ICD-10-CM

## 2015-01-13 DIAGNOSIS — C50412 Malignant neoplasm of upper-outer quadrant of left female breast: Secondary | ICD-10-CM

## 2015-01-13 DIAGNOSIS — Z5112 Encounter for antineoplastic immunotherapy: Secondary | ICD-10-CM

## 2015-01-13 DIAGNOSIS — G620 Drug-induced polyneuropathy: Secondary | ICD-10-CM | POA: Insufficient documentation

## 2015-01-13 DIAGNOSIS — Z5111 Encounter for antineoplastic chemotherapy: Secondary | ICD-10-CM

## 2015-01-13 DIAGNOSIS — T451X5A Adverse effect of antineoplastic and immunosuppressive drugs, initial encounter: Secondary | ICD-10-CM

## 2015-01-13 LAB — CBC WITH DIFFERENTIAL/PLATELET
BASO%: 0.5 % (ref 0.0–2.0)
BASOS ABS: 0 10*3/uL (ref 0.0–0.1)
EOS%: 0.1 % (ref 0.0–7.0)
Eosinophils Absolute: 0 10*3/uL (ref 0.0–0.5)
HCT: 36 % (ref 34.8–46.6)
HGB: 11.7 g/dL (ref 11.6–15.9)
LYMPH%: 13.1 % — AB (ref 14.0–49.7)
MCH: 30.3 pg (ref 25.1–34.0)
MCHC: 32.5 g/dL (ref 31.5–36.0)
MCV: 93.1 fL (ref 79.5–101.0)
MONO#: 0.7 10*3/uL (ref 0.1–0.9)
MONO%: 8.2 % (ref 0.0–14.0)
NEUT#: 6.3 10*3/uL (ref 1.5–6.5)
NEUT%: 78.1 % — AB (ref 38.4–76.8)
PLATELETS: 244 10*3/uL (ref 145–400)
RBC: 3.86 10*6/uL (ref 3.70–5.45)
RDW: 16.7 % — ABNORMAL HIGH (ref 11.2–14.5)
WBC: 8.1 10*3/uL (ref 3.9–10.3)
lymph#: 1.1 10*3/uL (ref 0.9–3.3)

## 2015-01-13 LAB — COMPREHENSIVE METABOLIC PANEL (CC13)
ALT: 23 U/L (ref 0–55)
AST: 24 U/L (ref 5–34)
Albumin: 3.5 g/dL (ref 3.5–5.0)
Alkaline Phosphatase: 65 U/L (ref 40–150)
Anion Gap: 10 mEq/L (ref 3–11)
BUN: 9.9 mg/dL (ref 7.0–26.0)
CALCIUM: 8.6 mg/dL (ref 8.4–10.4)
CHLORIDE: 107 meq/L (ref 98–109)
CO2: 26 mEq/L (ref 22–29)
Creatinine: 0.7 mg/dL (ref 0.6–1.1)
Glucose: 93 mg/dl (ref 70–140)
Potassium: 3.9 mEq/L (ref 3.5–5.1)
SODIUM: 142 meq/L (ref 136–145)
TOTAL PROTEIN: 6 g/dL — AB (ref 6.4–8.3)
Total Bilirubin: 0.57 mg/dL (ref 0.20–1.20)

## 2015-01-13 MED ORDER — ONDANSETRON 16 MG/50ML IVPB (CHCC)
INTRAVENOUS | Status: AC
  Filled 2015-01-13: qty 16

## 2015-01-13 MED ORDER — CARBOPLATIN CHEMO INJECTION 600 MG/60ML
600.0000 mg | Freq: Once | INTRAVENOUS | Status: AC
  Administered 2015-01-13: 600 mg via INTRAVENOUS
  Filled 2015-01-13: qty 60

## 2015-01-13 MED ORDER — ACETAMINOPHEN 325 MG PO TABS
ORAL_TABLET | ORAL | Status: AC
  Filled 2015-01-13: qty 2

## 2015-01-13 MED ORDER — TRASTUZUMAB CHEMO INJECTION 440 MG
6.0000 mg/kg | Freq: Once | INTRAVENOUS | Status: AC
  Administered 2015-01-13: 378 mg via INTRAVENOUS
  Filled 2015-01-13: qty 18

## 2015-01-13 MED ORDER — HEPARIN SOD (PORK) LOCK FLUSH 100 UNIT/ML IV SOLN
500.0000 [IU] | Freq: Once | INTRAVENOUS | Status: AC | PRN
  Administered 2015-01-13: 500 [IU]
  Filled 2015-01-13: qty 5

## 2015-01-13 MED ORDER — SODIUM CHLORIDE 0.9 % IJ SOLN
10.0000 mL | INTRAMUSCULAR | Status: DC | PRN
  Administered 2015-01-13: 10 mL
  Filled 2015-01-13: qty 10

## 2015-01-13 MED ORDER — PERTUZUMAB CHEMO INJECTION 420 MG/14ML
420.0000 mg | Freq: Once | INTRAVENOUS | Status: AC
  Administered 2015-01-13: 420 mg via INTRAVENOUS
  Filled 2015-01-13: qty 14

## 2015-01-13 MED ORDER — DIPHENHYDRAMINE HCL 25 MG PO CAPS
ORAL_CAPSULE | ORAL | Status: AC
  Filled 2015-01-13: qty 1

## 2015-01-13 MED ORDER — DOCETAXEL CHEMO INJECTION 160 MG/16ML
75.0000 mg/m2 | Freq: Once | INTRAVENOUS | Status: AC
  Administered 2015-01-13: 130 mg via INTRAVENOUS
  Filled 2015-01-13: qty 13

## 2015-01-13 MED ORDER — DIPHENHYDRAMINE HCL 25 MG PO CAPS
25.0000 mg | ORAL_CAPSULE | Freq: Once | ORAL | Status: AC
  Administered 2015-01-13: 25 mg via ORAL

## 2015-01-13 MED ORDER — DEXAMETHASONE SODIUM PHOSPHATE 20 MG/5ML IJ SOLN
20.0000 mg | Freq: Once | INTRAMUSCULAR | Status: AC
  Administered 2015-01-13: 20 mg via INTRAVENOUS

## 2015-01-13 MED ORDER — LORAZEPAM 2 MG/ML IJ SOLN: 0.5000 mg | Freq: Once | INTRAMUSCULAR | Status: DC

## 2015-01-13 MED ORDER — ONDANSETRON 16 MG/50ML IVPB (CHCC)
16.0000 mg | Freq: Once | INTRAVENOUS | Status: AC
  Administered 2015-01-13: 16 mg via INTRAVENOUS

## 2015-01-13 MED ORDER — ACETAMINOPHEN 325 MG PO TABS
650.0000 mg | ORAL_TABLET | Freq: Once | ORAL | Status: AC
  Administered 2015-01-13: 650 mg via ORAL

## 2015-01-13 MED ORDER — SODIUM CHLORIDE 0.9 % IV SOLN
Freq: Once | INTRAVENOUS | Status: AC
  Administered 2015-01-13: 10:00:00 via INTRAVENOUS

## 2015-01-13 MED ORDER — DEXAMETHASONE SODIUM PHOSPHATE 20 MG/5ML IJ SOLN
INTRAMUSCULAR | Status: AC
  Filled 2015-01-13: qty 5

## 2015-01-13 NOTE — Patient Instructions (Signed)
[facility] Discharge Instructions for Patients Receiving Chemotherapy  Today you received the following chemotherapy agents taxotere/carboplatin/hercetpin/perjeta  To help prevent nausea and vomiting after your treatment, we encourage you to take your nausea medication as directed   If you develop nausea and vomiting that is not controlled by your nausea medication, call the clinic.   BELOW ARE SYMPTOMS THAT SHOULD BE REPORTED IMMEDIATELY:  *FEVER GREATER THAN 100.5 F  *CHILLS WITH OR WITHOUT FEVER  NAUSEA AND VOMITING THAT IS NOT CONTROLLED WITH YOUR NAUSEA MEDICATION  *UNUSUAL SHORTNESS OF BREATH  *UNUSUAL BRUISING OR BLEEDING  TENDERNESS IN MOUTH AND THROAT WITH OR WITHOUT PRESENCE OF ULCERS  *URINARY PROBLEMS  *BOWEL PROBLEMS  UNUSUAL RASH Items with * indicate a potential emergency and should be followed up as soon as possible.  Feel free to call the clinic you have any questions or concerns. The clinic phone number is [phone].

## 2015-01-13 NOTE — Telephone Encounter (Signed)
pt came in to r/s inj to 2days after tx...done....printed pt new sched

## 2015-01-13 NOTE — Progress Notes (Signed)
[place]  [phone] Fax:(336) [phone]     ID: Nancy Austin DOB: 10-30-1976  MR#: [id number]  MQK#:[id number]  Patient Care Team: Logan Bores, MD as PCP - General (Obstetrics and Gynecology) Rolm Bookbinder, MD as Consulting Physician (General Surgery) Blair Promise, MD as Consulting Physician (Radiation Oncology) Chauncey Cruel, MD as Consulting Physician (Oncology) OTHER MD:  CHIEF COMPLAINT: Triple positive breast cancer  CURRENT TREATMENT: neoadjuvant chemotherapy and anti-HER-2 immunotherapy   BREAST CANCER HISTORY: From the original Intake note:  Moranda herself found a lump in her left breast early October 2015 and brought it to her gynecologist attention. On 09/20/2014 she was set up for bilateral diagnostic mammography and left breast ultrasonography of the breast Center. This was the patient's first ever mammogram. In the area of concern in the left breast there was suspicious pleomorphic calcifications spanning 1.1 cm. There was no discrete mammographic mass in this dense breasts (category C.). On physical exam, there was a palpable firm at 1.5 cm mass at the 3:00 position in the left breast. By ultrasound this was irregular and hypoechoic and measured 1.8 cm. Ultrasound of the left axilla was unremarkable. Aside from multiple cysts in the left breast there were no other findings of concern.  On the same day, 09/20/2014, the patient underwent biopsy of the left breast palpable mass. This showed ([id number]) and invasive ductal carcinoma, grade 2, estrogen receptor 100% positive, progesterone receptor 89% positive, both with strong staining intensity, with an MIB-1 of 16%. HER-2 was amplified with a signals ratio of 5.07 and a copy number per cell of 6.85. Paragraph on 09/30/2014 the patient underwent bilateral breast MRIs. This showed a 1.7 cm mass in the upper outer quadrant of the left breast. There was no other suspicious finding  in either breast and no abnormal appearing adenopathy.  The patient's subsequent history is as detailed below.  INTERVAL HISTORY: Adasyn teturns today for follow-up of her breast cancer accompanied by her mother. Today is day 1 cycle 5 of 6 planned cycles of carboplatin, docetaxel, trastuzumab and pertuzumab, with neulasta given on day 2 for granulocyte support and IV fluids given on day 5 as needed.   REVIEW OF SYSTEMS: Shade denies fevers, chills, nausea, or vomiting. She has had no more constipation since stopping the zofran with her last cycle. Her appetite is good and she is drinking well. She denies shortness of breath, chest pain, cough, or palpitations. She is increasingly fatigued with every cycle. The gabapentin helps with her hot flashes and she is sleeping better at night. She has had some muscle soreness for the past 2 weeks, though she has not changed her workout routine. She takes advil at night before sleep. She has some sinus congestion this week. For the past week she has experienced numbness/tingling to toes 2-4 that comes and goes since her last treatment. A detailed review of systems is otherwise stable.    PAST MEDICAL HISTORY: Past Medical History  Diagnosis Date  . Breast cancer of upper-outer quadrant of left female breast 09/24/2014    PAST SURGICAL HISTORY: Past Surgical History  Procedure Laterality Date  . Tonsillectomy  1996  . Portacath placement N/A 10/15/2014    Procedure: INSERTION PORT-A-CATH;  Surgeon: Rolm Bookbinder, MD;  Location: [facility];  Service: General;  Laterality: N/A;    FAMILY HISTORY Family History  Problem Relation Age of Onset  . Skin cancer Father   . Prostate cancer Maternal Uncle 60  currently 61  . Multiple myeloma Paternal Grandmother 81    Deceased 20  The patient's parents are both living. The patient has one brother, no sisters. One grandmother was diagnosed with multiple myeloma at age 63. There is no history of breast  or ovarian cancer in the family.   GYNECOLOGIC HISTORY:  No LMP recorded.  menarche age 8, first live birth age 26. The patient is GX P2. She still having regular periods. She was on birth control pills on and off for the last 15 years, stopping in October of 2015.   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 Her husband Rolla Plate works as a Immunologist at [employer]. Their children are Sam age 40 and Terrence Dupont age 74.    ADVANCED DIRECTIVES: In place   HEALTH MAINTENANCE: History  Substance Use Topics  . Smoking status: Never Smoker   . Smokeless tobacco: Never Used  . Alcohol Use: 1.8 oz/week    3 Glasses of wine per week     Colonoscopy:  PAP:  November 2014  Bone density:  Lipid panel:  Allergies  Allergen Reactions  . Morphine And Related Nausea And Vomiting    Current Outpatient Prescriptions  Medication Sig Dispense Refill  . dexamethasone (DECADRON) 4 MG tablet TAKE TWO TABLETS BY MOUTH TWICE DAILY STARTING DAY BEFORE TAXOTERE THEN AGAIN THE DAY AFTER CHEMO FOR 3 DAYS 30 tablet 2  . gabapentin (NEURONTIN) 300 MG capsule Take 1 capsule (300 mg total) by mouth at bedtime. 30 capsule 1  . gabapentin (NEURONTIN) 300 MG capsule TAKE ONE CAPSULE BY MOUTH AT BEDTIME 30 capsule 0  . lidocaine-prilocaine (EMLA) cream Apply 1 application topically as needed. Apply over port area 1-2 hours before chemo, then cover with plastic wrap 30 g 0  . Multiple Vitamin (MULTIVITAMIN WITH MINERALS) TABS tablet Take 1 tablet by mouth every morning.    . Naproxen Sodium (ALEVE) 220 MG CAPS Take 400 mg by mouth daily as needed (pain.).    Marland Kitchen valACYclovir (VALTREX) 500 MG tablet Take 1 tablet (500 mg total) by mouth daily. 30 tablet 3  . cholestyramine (QUESTRAN) 4 G packet Take 1 packet (4 g total) by mouth 3 (three) times daily with meals. (Patient not taking: Reported on 01/13/2015) 60 each 12  . Diphenhyd-Hydrocort-Nystatin (FIRST-DUKES MOUTHWASH) SUSP Take 5-10 mLs by  mouth 4 (four) times daily as needed (pt may swish spit or swallow). 1 to 1 to 1 ratio (Patient not taking: Reported on 01/13/2015) 240 mL 3  . diphenhydramine-acetaminophen (TYLENOL PM) 25-500 MG TABS Take 1 tablet by mouth at bedtime as needed (sleep).     Marland Kitchen HYDROcodone-acetaminophen (NORCO) 10-325 MG per tablet Take 1 tablet by mouth every 6 (six) hours as needed. (Patient not taking: Reported on 01/13/2015) 20 tablet 0  . LORazepam (ATIVAN) 0.5 MG tablet Take 1 tablet (0.5 mg total) by mouth every 6 (six) hours as needed (Nausea or vomiting). (Patient not taking: Reported on 01/13/2015) 30 tablet 0  . ondansetron (ZOFRAN) 8 MG tablet Take 1 tablet (8 mg total) by mouth 2 (two) times daily. Start the day after chemo for 3 days. Then take as needed for nausea or vomiting. (Patient not taking: Reported on 01/13/2015) 30 tablet 1  . prochlorperazine (COMPAZINE) 10 MG tablet TAKE ONE TABLET BY MOUTH EVERY 6 HOURS AS NEEDED FOR NAUSEA AND VOMITING (Patient not taking: Reported on 01/13/2015) 30 tablet 2  . tobramycin-dexamethasone (TOBRADEX) ophthalmic solution Place 1  drop into both eyes 2 (two) times daily. (Patient not taking: Reported on 01/13/2015) 5 mL 0   No current facility-administered medications for this visit.   Facility-Administered Medications Ordered in Other Visits  Medication Dose Route Frequency Provider Last Rate Last Dose  . 0.9 %  sodium chloride infusion   Intravenous Once Chauncey Cruel, MD        OBJECTIVE: young white woman  in no acute distress  Filed Vitals:   01/13/15 0905  BP: 116/74  Pulse: 71  Temp: 98.9 F (37.2 C)  Resp: 18     Body mass index is 24.14 kg/(m^2).    ECOG FS:1 - Symptomatic but completely ambulatory  Skin: warm, dry  HEENT: sclerae anicteric, conjunctivae pink, oropharynx clear. No thrush or mucositis.  Lymph Nodes: No cervical or supraclavicular lymphadenopathy  Lungs: clear to auscultation bilaterally, no rales, wheezes, or rhonci  Heart: regular  rate and rhythm  Abdomen: round, soft, non tender, positive bowel sounds  Musculoskeletal: No focal spinal tenderness, no peripheral edema  Neuro: non focal, well oriented, positive affect Breasts: deferred  LAB RESULTS:  CMP     Component Value Date/Time   NA 142 01/13/2015 0854   NA 144 10/02/2014 1206   K 3.9 01/13/2015 0854   K 3.9 10/02/2014 1206   CL 104 10/02/2014 1206   CO2 26 01/13/2015 0854   CO2 26 10/02/2014 1206   GLUCOSE 93 01/13/2015 0854   GLUCOSE 103* 10/02/2014 1206   BUN 9.9 01/13/2015 0854   BUN 11 10/02/2014 1206   CREATININE 0.7 01/13/2015 0854   CREATININE 0.87 10/02/2014 1206   CALCIUM 8.6 01/13/2015 0854   CALCIUM 9.4 10/02/2014 1206   PROT 6.0* 01/13/2015 0854   PROT 7.3 10/02/2014 1206   ALBUMIN 3.5 01/13/2015 0854   ALBUMIN 3.9 10/02/2014 1206   AST 24 01/13/2015 0854   AST 20 10/02/2014 1206   ALT 23 01/13/2015 0854   ALT 16 10/02/2014 1206   ALKPHOS 65 01/13/2015 0854   ALKPHOS 45 10/02/2014 1206   BILITOT 0.57 01/13/2015 0854   BILITOT 0.4 10/02/2014 1206    I No results found for: SPEP  Lab Results  Component Value Date   WBC 8.1 01/13/2015   NEUTROABS 6.3 01/13/2015   HGB 11.7 01/13/2015   HCT 36.0 01/13/2015   MCV 93.1 01/13/2015   PLT 244 01/13/2015      Chemistry      Component Value Date/Time   NA 142 01/13/2015 0854   NA 144 10/02/2014 1206   K 3.9 01/13/2015 0854   K 3.9 10/02/2014 1206   CL 104 10/02/2014 1206   CO2 26 01/13/2015 0854   CO2 26 10/02/2014 1206   BUN 9.9 01/13/2015 0854   BUN 11 10/02/2014 1206   CREATININE 0.7 01/13/2015 0854   CREATININE 0.87 10/02/2014 1206      Component Value Date/Time   CALCIUM 8.6 01/13/2015 0854   CALCIUM 9.4 10/02/2014 1206   ALKPHOS 65 01/13/2015 0854   ALKPHOS 45 10/02/2014 1206   AST 24 01/13/2015 0854   AST 20 10/02/2014 1206   ALT 23 01/13/2015 0854   ALT 16 10/02/2014 1206   BILITOT 0.57 01/13/2015 0854   BILITOT 0.4 10/02/2014 1206       No results  found for: LABCA2  No components found for: LABCA125  No results for input(s): INR in the last 168 hours.  Urinalysis No results found for: COLORURINE  STUDIES: Mm Digital Diagnostic Unilat L  12/19/2014   CLINICAL DATA:  Patient with recent diagnosis left breast malignancy. Currently on adjuvant chemotherapy.  EXAM: DIGITAL DIAGNOSTIC  LEFT MAMMOGRAM WITH CAD  ULTRASOUND LEFT BREAST  COMPARISON:  [facility]  ACR Breast Density Category c: The breast tissue is heterogeneously dense, which may obscure small masses.  FINDINGS: Re- demonstrated 9 mm of pleomorphic calcifications within the lateral aspect of the left breast. Biopsy marking clip adjacent to the calcifications is demonstrated. Stable oval circumscribed mass 12 o'clock position left breast, previously evaluated and found to be cyst.  Mammographic images were processed with CAD.  On physical exam, I palpate no discrete mass within the lateral left breast.  Ultrasound is performed, showing a 0.8 x 0.6 x 1.0 cm irregular hypoechoic mass within the left breast 2 o'clock position 3 cm from the nipple. This is less conspicuous and decreased in size when compared to prior examination were it measured 1.8 x 1.1 x 1.7 cm.  IMPRESSION: Interval decrease in size of known left breast malignancy. Persistent associated pleomorphic calcifications.  RECOMMENDATION: Continue treatment plan for known left breast malignancy.  I have discussed the findings and recommendations with the patient. Results were also provided in writing at the conclusion of the visit. If applicable, a reminder letter will be sent to the patient regarding the next appointment.  BI-RADS CATEGORY  6: Known biopsy-proven malignancy.   Electronically Signed   By: Lovey Newcomer M.D.   On: 12/19/2014 09:32   US Breast Ltd Uni Left Inc Axilla  12/19/2014   CLINICAL DATA:  Patient with recent diagnosis left breast malignancy. Currently on adjuvant chemotherapy.  EXAM: DIGITAL DIAGNOSTIC  LEFT  MAMMOGRAM WITH CAD  ULTRASOUND LEFT BREAST  COMPARISON:  [facility]  ACR Breast Density Category c: The breast tissue is heterogeneously dense, which may obscure small masses.  FINDINGS: Re- demonstrated 9 mm of pleomorphic calcifications within the lateral aspect of the left breast. Biopsy marking clip adjacent to the calcifications is demonstrated. Stable oval circumscribed mass 12 o'clock position left breast, previously evaluated and found to be cyst.  Mammographic images were processed with CAD.  On physical exam, I palpate no discrete mass within the lateral left breast.  Ultrasound is performed, showing a 0.8 x 0.6 x 1.0 cm irregular hypoechoic mass within the left breast 2 o'clock position 3 cm from the nipple. This is less conspicuous and decreased in size when compared to prior examination were it measured 1.8 x 1.1 x 1.7 cm.  IMPRESSION: Interval decrease in size of known left breast malignancy. Persistent associated pleomorphic calcifications.  RECOMMENDATION: Continue treatment plan for known left breast malignancy.  I have discussed the findings and recommendations with the patient. Results were also provided in writing at the conclusion of the visit. If applicable, a reminder letter will be sent to the patient regarding the next appointment.  BI-RADS CATEGORY  6: Known biopsy-proven malignancy.   Electronically Signed   By: Lovey Newcomer M.D.   On: 12/19/2014 09:32   Most recent echocardiogram on 01/10/15 showed an EF of 55-60%   ASSESSMENT: 39 y.o. BRCA negative [place] woman s/p Left breast upper outer quadrant biopsy 09/20/2014, for a clinical T1c N0, stage IA invasive ductal carcinoma, grade 2, estrogen and progesterone receptor positive, HER-2 amplified with a signal is ratio of 5.07, and an MIB-1 of 16%  (1) neoadjuvant treatment to consist of carboplatin, docetaxel, trastuzumab and pertuzumab given every 21 days x6, with Neulasta day 2, started 10/21/2014, scheduled to be completed  02/03/2015  (  2) definitive surgery to follow chemotherapy   (3) trastuzumab to be continued to total one year  (4) radiation to follow surgery if appropriate  (5) antiestrogens to follow radiation  (6)  the [employer] geneprofile  ([place] [employer]) obtained November 2015 did not reveal a mutation in ATM, BARD1, BRCA1, BRCA BRIP1, CDH1, CHEK2, MRE11A, MUTYH, NBN, NF1, PALB2, PTEN, RAD50, RAD51C, RAD51D, or TP53  PLAN: Carren is doing well today. The labs were reviewed in detail and were entirely stable. We discussed the neuropathy symptoms to her bilateral toes, this comes and goes and has not effected her great toes. She will proceed with cycle 5 of carboplatin, docetaxel, trastuzumab, and pertuzumab. She had an echo last week that showed a well preserved ejection fraction. It will next be due in April.   She will continue to monitor for peripheral neuropathy symptoms. If they worsen with this cycle, docetaxel will have to be either adjusted or removed for her final cycle.   Monette will return this Friday for IV fluids. Her next office visit will be in 3 weeks. She understands and agrees with this plan. She knows the goal of treatment in her case is cure. She has been encouraged to call with any issues that might arise before her next visit here.    Marcelino Duster, NP   01/13/2015 9:44 AM

## 2015-01-14 ENCOUNTER — Ambulatory Visit: Payer: 59

## 2015-01-15 ENCOUNTER — Ambulatory Visit (HOSPITAL_BASED_OUTPATIENT_CLINIC_OR_DEPARTMENT_OTHER): Payer: 59

## 2015-01-15 DIAGNOSIS — C50812 Malignant neoplasm of overlapping sites of left female breast: Secondary | ICD-10-CM

## 2015-01-15 DIAGNOSIS — C50412 Malignant neoplasm of upper-outer quadrant of left female breast: Secondary | ICD-10-CM

## 2015-01-15 DIAGNOSIS — Z5189 Encounter for other specified aftercare: Secondary | ICD-10-CM

## 2015-01-15 MED ORDER — PEGFILGRASTIM INJECTION 6 MG/0.6ML ~~LOC~~
6.0000 mg | PREFILLED_SYRINGE | Freq: Once | SUBCUTANEOUS | Status: AC
  Administered 2015-01-15: 6 mg via SUBCUTANEOUS
  Filled 2015-01-15: qty 0.6

## 2015-01-17 ENCOUNTER — Ambulatory Visit (HOSPITAL_BASED_OUTPATIENT_CLINIC_OR_DEPARTMENT_OTHER): Payer: 59

## 2015-01-17 DIAGNOSIS — R232 Flushing: Secondary | ICD-10-CM

## 2015-01-17 DIAGNOSIS — R55 Syncope and collapse: Secondary | ICD-10-CM

## 2015-01-17 DIAGNOSIS — G620 Drug-induced polyneuropathy: Secondary | ICD-10-CM

## 2015-01-17 DIAGNOSIS — C50412 Malignant neoplasm of upper-outer quadrant of left female breast: Secondary | ICD-10-CM

## 2015-01-17 DIAGNOSIS — C50812 Malignant neoplasm of overlapping sites of left female breast: Secondary | ICD-10-CM

## 2015-01-17 DIAGNOSIS — N920 Excessive and frequent menstruation with regular cycle: Secondary | ICD-10-CM

## 2015-01-17 DIAGNOSIS — R5383 Other fatigue: Secondary | ICD-10-CM

## 2015-01-17 DIAGNOSIS — T451X5A Adverse effect of antineoplastic and immunosuppressive drugs, initial encounter: Secondary | ICD-10-CM

## 2015-01-17 MED ORDER — HEPARIN SOD (PORK) LOCK FLUSH 100 UNIT/ML IV SOLN
500.0000 [IU] | Freq: Once | INTRAVENOUS | Status: AC | PRN
  Administered 2015-01-17: 500 [IU]
  Filled 2015-01-17: qty 5

## 2015-01-17 MED ORDER — SODIUM CHLORIDE 0.9 % IV SOLN
Freq: Once | INTRAVENOUS | Status: AC
  Administered 2015-01-17: 14:00:00 via INTRAVENOUS

## 2015-01-17 MED ORDER — SODIUM CHLORIDE 0.9 % IJ SOLN
10.0000 mL | INTRAMUSCULAR | Status: DC | PRN
  Administered 2015-01-17: 10 mL
  Filled 2015-01-17: qty 10

## 2015-01-17 NOTE — Progress Notes (Signed)
Denies need for anti-emetics.  Eating and drinking at home without any n/v or diarrhrea.

## 2015-01-17 NOTE — Progress Notes (Signed)
Discharged with spouse at 44 ambulatory in no distress.

## 2015-01-17 NOTE — Patient Instructions (Signed)
Dehydration, Adult Dehydration is when you lose more fluids from the body than you take in. Vital organs like the kidneys, brain, and heart cannot function without a proper amount of fluids and salt. Any loss of fluids from the body can cause dehydration.  CAUSES   Vomiting.  Diarrhea.  Excessive sweating.  Excessive urine output.  Fever. SYMPTOMS  Mild dehydration  Thirst.  Dry lips.  Slightly dry mouth. Moderate dehydration  Very dry mouth.  Sunken eyes.  Skin does not bounce back quickly when lightly pinched and released.  Dark urine and decreased urine production.  Decreased tear production.  Headache. Severe dehydration  Very dry mouth.  Extreme thirst.  Rapid, weak pulse (more than 100 beats per minute at rest).  Cold hands and feet.  Not able to sweat in spite of heat and temperature.  Rapid breathing.  Blue lips.  Confusion and lethargy.  Difficulty being awakened.  Minimal urine production.  No tears. DIAGNOSIS  Your caregiver will diagnose dehydration based on your symptoms and your exam. Blood and urine tests will help confirm the diagnosis. The diagnostic evaluation should also identify the cause of dehydration. TREATMENT  Treatment of mild or moderate dehydration can often be done at home by increasing the amount of fluids that you drink. It is best to drink small amounts of fluid more often. Drinking too much at one time can make vomiting worse. Refer to the home care instructions below. Severe dehydration needs to be treated at the hospital where you will probably be given intravenous (IV) fluids that contain water and electrolytes. HOME CARE INSTRUCTIONS   Ask your caregiver about specific rehydration instructions.  Drink enough fluids to keep your urine clear or pale yellow.  Drink small amounts frequently if you have nausea and vomiting.  Eat as you normally do.  Avoid:  Foods or drinks high in sugar.  Carbonated  drinks.  Juice.  Extremely hot or cold fluids.  Drinks with caffeine.  Fatty, greasy foods.  Alcohol.  Tobacco.  Overeating.  Gelatin desserts.  Wash your hands well to avoid spreading bacteria and viruses.  Only take over-the-counter or prescription medicines for pain, discomfort, or fever as directed by your caregiver.  Ask your caregiver if you should continue all prescribed and over-the-counter medicines.  Keep all follow-up appointments with your caregiver. SEEK MEDICAL CARE IF:  You have abdominal pain and it increases or stays in one area (localizes).  You have a rash, stiff neck, or severe headache.  You are irritable, sleepy, or difficult to awaken.  You are weak, dizzy, or extremely thirsty. SEEK IMMEDIATE MEDICAL CARE IF:   You are unable to keep fluids down or you get worse despite treatment.  You have frequent episodes of vomiting or diarrhea.  You have blood or green matter (bile) in your vomit.  You have blood in your stool or your stool looks black and tarry.  You have not urinated in 6 to 8 hours, or you have only urinated a small amount of very dark urine.  You have a fever.  You faint. MAKE SURE YOU:   Understand these instructions.  Will watch your condition.  Will get help right away if you are not doing well or get worse. Document Released: 11/29/2005 Document Revised: 02/21/2012 Document Reviewed: 07/19/2011 Exit[employer] Patient Information 2015 [employer]. This information is not intended to replace advice given to you by your health care provider. Make sure you discuss any questions you have with your health care   provider.  

## 2015-01-23 ENCOUNTER — Other Ambulatory Visit (HOSPITAL_BASED_OUTPATIENT_CLINIC_OR_DEPARTMENT_OTHER): Payer: 59

## 2015-01-23 ENCOUNTER — Telehealth: Payer: Self-pay | Admitting: Nurse Practitioner

## 2015-01-23 ENCOUNTER — Encounter: Payer: Self-pay | Admitting: Nurse Practitioner

## 2015-01-23 ENCOUNTER — Ambulatory Visit (HOSPITAL_BASED_OUTPATIENT_CLINIC_OR_DEPARTMENT_OTHER): Payer: 59 | Admitting: Nurse Practitioner

## 2015-01-23 ENCOUNTER — Telehealth: Payer: Self-pay | Admitting: *Deleted

## 2015-01-23 VITALS — BP 115/75 | HR 69 | Temp 98.2°F | Resp 18 | Ht 66.0 in | Wt 149.0 lb

## 2015-01-23 DIAGNOSIS — C50812 Malignant neoplasm of overlapping sites of left female breast: Secondary | ICD-10-CM

## 2015-01-23 DIAGNOSIS — C50412 Malignant neoplasm of upper-outer quadrant of left female breast: Secondary | ICD-10-CM

## 2015-01-23 DIAGNOSIS — R05 Cough: Secondary | ICD-10-CM

## 2015-01-23 DIAGNOSIS — J40 Bronchitis, not specified as acute or chronic: Secondary | ICD-10-CM

## 2015-01-23 LAB — CBC WITH DIFFERENTIAL/PLATELET
BASO%: 0.1 % (ref 0.0–2.0)
BASOS ABS: 0 10*3/uL (ref 0.0–0.1)
EOS ABS: 0 10*3/uL (ref 0.0–0.5)
EOS%: 0 % (ref 0.0–7.0)
HCT: 34.6 % — ABNORMAL LOW (ref 34.8–46.6)
HEMOGLOBIN: 11 g/dL — AB (ref 11.6–15.9)
LYMPH#: 1.9 10*3/uL (ref 0.9–3.3)
LYMPH%: 15.5 % (ref 14.0–49.7)
MCH: 30.1 pg (ref 25.1–34.0)
MCHC: 31.8 g/dL (ref 31.5–36.0)
MCV: 94.8 fL (ref 79.5–101.0)
MONO#: 0.9 10*3/uL (ref 0.1–0.9)
MONO%: 7.6 % (ref 0.0–14.0)
NEUT#: 9.3 10*3/uL — ABNORMAL HIGH (ref 1.5–6.5)
NEUT%: 76.8 % (ref 38.4–76.8)
Platelets: 245 10*3/uL (ref 145–400)
RBC: 3.65 10*6/uL — AB (ref 3.70–5.45)
RDW: 14.4 % (ref 11.2–14.5)
WBC: 12 10*3/uL — ABNORMAL HIGH (ref 3.9–10.3)

## 2015-01-23 LAB — COMPREHENSIVE METABOLIC PANEL (CC13)
ALBUMIN: 3.6 g/dL (ref 3.5–5.0)
ALK PHOS: 79 U/L (ref 40–150)
ALT: 20 U/L (ref 0–55)
AST: 24 U/L (ref 5–34)
Anion Gap: 11 mEq/L (ref 3–11)
BUN: 3.5 mg/dL — ABNORMAL LOW (ref 7.0–26.0)
CO2: 29 meq/L (ref 22–29)
Calcium: 9.2 mg/dL (ref 8.4–10.4)
Chloride: 105 mEq/L (ref 98–109)
Creatinine: 0.7 mg/dL (ref 0.6–1.1)
GLUCOSE: 90 mg/dL (ref 70–140)
POTASSIUM: 3.8 meq/L (ref 3.5–5.1)
Sodium: 144 mEq/L (ref 136–145)
TOTAL PROTEIN: 6.3 g/dL — AB (ref 6.4–8.3)
Total Bilirubin: 0.24 mg/dL (ref 0.20–1.20)

## 2015-01-23 MED ORDER — HYDROCOD POLST-CHLORPHEN POLST 10-8 MG/5ML PO LQCR: 5.0000 mL | Freq: Every evening | ORAL | Status: DC | PRN

## 2015-01-23 MED ORDER — AZITHROMYCIN 250 MG PO TABS: ORAL_TABLET | ORAL | Status: DC

## 2015-01-23 NOTE — Telephone Encounter (Signed)
per pof to sch pt appt for SMS-pt aware

## 2015-01-23 NOTE — Assessment & Plan Note (Signed)
Patient completed cycle 5 of her neoadjuvant carboplatin/docetaxel/Herceptin/Perjeta chemotherapy regimen on 01/13/2015.  She is scheduled to return for cycle 6 of the same regimen on 02/03/2015.Marland Kitchen  Patient's next echo will be due in April 2016.

## 2015-01-23 NOTE — Telephone Encounter (Signed)
@  [id number] Provider input needed: VISIT  Patient Name: Nancy Austin  MRN: [id number] DOB: 1976-08-22  Date: @TODAY @ Telephone: [phone] (home)  CSN: [id number]   Allergies: is allergic to morphine and related.      Reason for call: PT. IS ALSO HAVING DIARRHEA.  Chief Complaint  Patient presents with  . URI    LAST CHEMO ON 01/13/15. SYMPTOMS STARTED ON 01/13/15.       Patient last received chemotherapy/ treatment on: 01/13/15.  Patient was last seen in the office on 01/13/15.  Next appt is 02/03/15.      Is patient having fevers greater than 100.5? Yes []    No [x]    Comments:    Is patient having uncontrolled pain, or new pain? Yes []    No [x]    Comments:   Is patient having new back pain that changes with position Yes []                                   (worsens or eases when laying down?) No [x]    Comments:     Is patient able to eat and drink? Yes [x]    No []    Comments: PT. IS FORCING FLUIDS    Is patient able to pass stool without difficulty?  Yes [x]    No []    Comments:PT. IS HAVING DIARRHEA. FOUR STOOLS IN THE PAST 24 HOURS.     Is patient having uncontrolled nausea?  Yes []    No [x]    Comments:  Summary AT FIRST PT. HAD A COUGH WITH GREEN SPUTUM BUT NOW DRY COUGH WITH OCCASIONAL CLEAR SPUTUM. PT. HAS A SORE THROAT. NASAL DRAINAGE BUT NO SINUS PRESSURE. Based on the above information advised patient to  [place].   @[facility] @  01/23/2015, 9:49 AM

## 2015-01-23 NOTE — Assessment & Plan Note (Signed)
Patient is complaining of a one-week history of some URI symptoms which consist of nasal congestion, bilateral ear fullness, mild sore throat, sinus drainage, and frequent cough that is occasionally productive.  Patient is complaining that her cough is keeping her awake at night.  Patient denies any recent fevers or chills.  On exam-lung sounds essentially clear; with no wheezes.  Patient was noted to have a dry cough on exam.  No shortness of breath or acute respiratory distress noted.  Patient was afebrile.  Will prescribe Zithromax for treatment of bronchitis.  Will also prescribed Tussionex cough syrup to use only at night sparingly to help with cough and sleep.  Advised patient to call/return if she develops any worsening symptoms whatsoever.

## 2015-01-23 NOTE — Progress Notes (Signed)
SYMPTOM MANAGEMENT CLINIC   HPI: Nancy Austin 39 y.o. female diagnosed with breast cancer.  Currently undergoing neoadjuvant carboplatin/docetaxel/Herceptin/Perjeta chemotherapy regimen.  Patient called the cancer Center today requesting urgent care visit.  Patient is complaining of a one-week history of URI symptoms which consist of nasal congestion, bilateral ear fullness, mild sore throat, sinus drainage, and frequent cough.  She states her cough is only at intermittently productive.  She denies any fevers or chills.  Patient denies any GI or UTI symptoms whatsoever.   HPI  CURRENT THERAPY: Upcoming Treatment Dates - BREAST Docetaxel / Carboplatin reduced AUC q21d (Order Herceptin separately) Days with orders from any treatment category:  02/03/2015      SCHEDULING COMMUNICATION      ondansetron (ZOFRAN) IVPB 16 mg      LORazepam (ATIVAN) injection 0.5 mg      Dexamethasone Sodium Phosphate (DECADRON) injection 20 mg      DOCEtaxel (TAXOTERE) 130 mg in dextrose 5 % 250 mL chemo infusion      CARBOplatin (PARAPLATIN) 600 mg in sodium chloride 0.9 % 250 mL chemo infusion      tobramycin-dexamethasone (TOBRADEX) ophthalmic solution      lidocaine-prilocaine (EMLA) cream      sodium chloride 0.9 % injection 10 mL      heparin lock flush 100 unit/mL      heparin lock flush 100 unit/mL      alteplase (CATHFLO ACTIVASE) injection 2 mg      sodium chloride 0.9 % injection 3 mL      Cold Pack 1 packet      diphenhydrAMINE (BENADRYL) injection 25 mg      famotidine (PEPCID) IVPB 20 mg      0.9 %  sodium chloride infusion      methylPREDNISolone sodium succinate (SOLU-MEDROL) 125 mg/2 mL injection 125 mg      EPINEPHrine (ADRENALIN) 0.1 MG/ML injection 0.25 mg      EPINEPHrine (ADRENALIN) 0.1 MG/ML injection 0.25 mg      EPINEPHrine (ADRENALIN) injection 0.5 mg      EPINEPHrine (ADRENALIN) injection 0.5 mg      diphenhydrAMINE (BENADRYL) injection 50 mg      albuterol (PROVENTIL)  (2.5 MG/3ML) 0.083% nebulizer solution 2.5 mg      0.9 %  sodium chloride infusion      TREATMENT CONDITIONS 02/04/2015      SCHEDULING COMMUNICATION INJECTION      LORazepam (ATIVAN) injection 0.5 mg      tobramycin-dexamethasone (TOBRADEX) ophthalmic solution      lidocaine-prilocaine (EMLA) cream      pegfilgrastim (NEULASTA) injection 6 mg    ROS  Past Medical History  Diagnosis Date  . Breast cancer of upper-outer quadrant of left female breast 09/24/2014    Past Surgical History  Procedure Laterality Date  . Tonsillectomy  1996  . Portacath placement N/A 10/15/2014    Procedure: INSERTION PORT-A-CATH;  Surgeon: Rolm Bookbinder, MD;  Location: [facility];  Service: General;  Laterality: N/A;    has Breast cancer of upper-outer quadrant of left female breast; Vasovagal near syncope; Spotting; Constipation; Hot flashes; Neuropathy due to chemotherapeutic drug; and Bronchitis on her problem list.    is allergic to morphine and related.    Medication List       This list is accurate as of: 01/23/15  1:40 PM.  Always use your most recent med list.               ALEVE  220 MG Caps  Generic drug:  Naproxen Sodium  Take 400 mg by mouth daily as needed (pain.).     azithromycin 250 MG tablet  Commonly known as:  ZITHROMAX Z-PAK  Take 2 tabs (500 mg) PO on day # 1; then take 1 tab (250 mg) PO  QD till gone.     chlorpheniramine-HYDROcodone 10-8 MG/5ML Lqcr  Commonly known as:  TUSSIONEX PENNKINETIC ER  Take 5 mLs by mouth at bedtime as needed for cough.     cholestyramine 4 G packet  Commonly known as:  QUESTRAN  Take 1 packet (4 g total) by mouth 3 (three) times daily with meals.     dexamethasone 4 MG tablet  Commonly known as:  DECADRON  TAKE TWO TABLETS BY MOUTH TWICE DAILY STARTING DAY BEFORE TAXOTERE THEN AGAIN THE DAY AFTER CHEMO FOR 3 DAYS     diphenhydramine-acetaminophen 25-500 MG Tabs  Commonly known as:  TYLENOL PM  Take 1 tablet by mouth at bedtime as  needed (sleep).     FIRST-DUKES MOUTHWASH Susp  Take 5-10 mLs by mouth 4 (four) times daily as needed (pt may swish spit or swallow). 1 to 1 to 1 ratio     gabapentin 300 MG capsule  Commonly known as:  NEURONTIN  Take 1 capsule (300 mg total) by mouth at bedtime.     gabapentin 300 MG capsule  Commonly known as:  NEURONTIN  TAKE ONE CAPSULE BY MOUTH AT BEDTIME     HYDROcodone-acetaminophen 10-325 MG per tablet  Commonly known as:  NORCO  Take 1 tablet by mouth every 6 (six) hours as needed.     lidocaine-prilocaine cream  Commonly known as:  EMLA  Apply 1 application topically as needed. Apply over port area 1-2 hours before chemo, then cover with plastic wrap     LORazepam 0.5 MG tablet  Commonly known as:  ATIVAN  Take 1 tablet (0.5 mg total) by mouth every 6 (six) hours as needed (Nausea or vomiting).     multivitamin with minerals Tabs tablet  Take 1 tablet by mouth every morning.     ondansetron 8 MG tablet  Commonly known as:  ZOFRAN  Take 1 tablet (8 mg total) by mouth 2 (two) times daily. Start the day after chemo for 3 days. Then take as needed for nausea or vomiting.     prochlorperazine 10 MG tablet  Commonly known as:  COMPAZINE  TAKE ONE TABLET BY MOUTH EVERY 6 HOURS AS NEEDED FOR NAUSEA AND VOMITING     tobramycin-dexamethasone ophthalmic solution  Commonly known as:  TOBRADEX  Place 1 drop into both eyes 2 (two) times daily.     valACYclovir 500 MG tablet  Commonly known as:  VALTREX  Take 1 tablet (500 mg total) by mouth daily.         PHYSICAL EXAMINATION  Blood pressure 115/75, pulse 69, temperature 98.2 F (36.8 C), temperature source Oral, resp. rate 18, height 5' 6"  (1.676 m), weight 149 lb (67.586 kg), SpO2 100 %.  Physical Exam  Constitutional: She is oriented to person, place, and time and well-developed, well-nourished, and in no distress.  Alopecia.  Moderate nasal congestion on exam; but no facial tenderness with palpation.   Oropharynx with slight erythema; but no exudate.  Patient's voice is hoarse.  HENT:  Head: Normocephalic and atraumatic.  Eyes: Conjunctivae and EOM are normal. Pupils are equal, round, and reactive to light. Right eye exhibits no discharge. Left eye exhibits no  discharge. No scleral icterus.  Neck: Normal range of motion. Neck supple. No JVD present. No tracheal deviation present. No thyromegaly present.  Cardiovascular: Normal rate, regular rhythm, normal heart sounds and intact distal pulses.   Pulmonary/Chest: Effort normal and breath sounds normal. No respiratory distress. She has no wheezes. She has no rales. She exhibits no tenderness.  Occasional dry cough on exam.  Abdominal: Soft. Bowel sounds are normal. She exhibits no distension and no mass. There is no tenderness. There is no rebound and no guarding.  Musculoskeletal: Normal range of motion. She exhibits no edema or tenderness.  Lymphadenopathy:    She has no cervical adenopathy.  Neurological: She is alert and oriented to person, place, and time. Gait normal.  Skin: Skin is warm and dry. No rash noted. No erythema. There is pallor.  Psychiatric: Affect normal.  Nursing note and vitals reviewed.   LABORATORY DATA:. Appointment on 01/23/2015  Component Date Value Ref Range Status  . WBC 01/23/2015 12.0* 3.9 - 10.3 10e3/uL Final  . NEUT# 01/23/2015 9.3* 1.5 - 6.5 10e3/uL Final  . HGB 01/23/2015 11.0* 11.6 - 15.9 g/dL Final  . HCT 01/23/2015 34.6* 34.8 - 46.6 % Final  . Platelets 01/23/2015 245  145 - 400 10e3/uL Final  . MCV 01/23/2015 94.8  79.5 - 101.0 fL Final  . MCH 01/23/2015 30.1  25.1 - 34.0 pg Final  . MCHC 01/23/2015 31.8  31.5 - 36.0 g/dL Final  . RBC 01/23/2015 3.65* 3.70 - 5.45 10e6/uL Final  . RDW 01/23/2015 14.4  11.2 - 14.5 % Final  . lymph# 01/23/2015 1.9  0.9 - 3.3 10e3/uL Final  . MONO# 01/23/2015 0.9  0.1 - 0.9 10e3/uL Final  . Eosinophils Absolute 01/23/2015 0.0  0.0 - 0.5 10e3/uL Final  . Basophils  Absolute 01/23/2015 0.0  0.0 - 0.1 10e3/uL Final  . NEUT% 01/23/2015 76.8  38.4 - 76.8 % Final  . LYMPH% 01/23/2015 15.5  14.0 - 49.7 % Final  . MONO% 01/23/2015 7.6  0.0 - 14.0 % Final  . EOS% 01/23/2015 0.0  0.0 - 7.0 % Final  . BASO% 01/23/2015 0.1  0.0 - 2.0 % Final  . Sodium 01/23/2015 144  136 - 145 mEq/L Final  . Potassium 01/23/2015 3.8  3.5 - 5.1 mEq/L Final  . Chloride 01/23/2015 105  98 - 109 mEq/L Final  . CO2 01/23/2015 29  22 - 29 mEq/L Final  . Glucose 01/23/2015 90  70 - 140 mg/dl Final  . BUN 01/23/2015 3.5* 7.0 - 26.0 mg/dL Final  . Creatinine 01/23/2015 0.7  0.6 - 1.1 mg/dL Final  . Total Bilirubin 01/23/2015 0.24  0.20 - 1.20 mg/dL Final  . Alkaline Phosphatase 01/23/2015 79  40 - 150 U/L Final  . AST 01/23/2015 24  5 - 34 U/L Final  . ALT 01/23/2015 20  0 - 55 U/L Final  . Total Protein 01/23/2015 6.3* 6.4 - 8.3 g/dL Final  . Albumin 01/23/2015 3.6  3.5 - 5.0 g/dL Final  . Calcium 01/23/2015 9.2  8.4 - 10.4 mg/dL Final  . Anion Gap 01/23/2015 11  3 - 11 mEq/L Final  . EGFR 01/23/2015 >90  >90 ml/min/1.73 m2 Final   eGFR is calculated using the CKD-EPI Creatinine Equation (2009)     RADIOGRAPHIC STUDIES: No results found.  ASSESSMENT/PLAN:    Breast cancer of upper-outer quadrant of left female breast Patient completed cycle 5 of her neoadjuvant carboplatin/docetaxel/Herceptin/Perjeta chemotherapy regimen on 01/13/2015.  She is scheduled to  return for cycle 6 of the same regimen on 02/03/2015.Marland Kitchen  Patient's next echo will be due in April 2016.   Bronchitis Patient is complaining of a one-week history of some URI symptoms which consist of nasal congestion, bilateral ear fullness, mild sore throat, sinus drainage, and frequent cough that is occasionally productive.  Patient is complaining that her cough is keeping her awake at night.  Patient denies any recent fevers or chills.  On exam-lung sounds essentially clear; with no wheezes.  Patient was noted to have a  dry cough on exam.  No shortness of breath or acute respiratory distress noted.  Patient was afebrile.  Will prescribe Zithromax for treatment of bronchitis.  Will also prescribed Tussionex cough syrup to use only at night sparingly to help with cough and sleep.  Advised patient to call/return if she develops any worsening symptoms whatsoever.    Reviewed patient's labs with her today.  All labs essentially within normal limits.  Patient stated understanding of all instructions; and was in agreement with this plan of care. The patient knows to call the clinic with any problems, questions or concerns.   Review/collaboration with Dr. Jana Hakim and Dr. Sondra Come regarding all aspects of patient's visit today.   Total time spent with patient was 25 minutes;  with greater than 75 percent of that time spent in face to face counseling regarding patient's symptoms,  and coordination of care and follow up.  Disclaimer: This note was dictated with voice recognition software. Similar sounding words can inadvertently be transcribed and may not be corrected upon review.   Drue Second, NP 01/23/2015

## 2015-01-24 ENCOUNTER — Telehealth: Payer: Self-pay | Admitting: *Deleted

## 2015-01-24 NOTE — Telephone Encounter (Signed)
Per Selena Lesser, NP; left message for pt "are your symptoms better?"  f/u re: [facility] office visit 01/23/15.

## 2015-02-03 ENCOUNTER — Telehealth: Payer: Self-pay | Admitting: Nurse Practitioner

## 2015-02-03 ENCOUNTER — Telehealth: Payer: Self-pay | Admitting: *Deleted

## 2015-02-03 ENCOUNTER — Ambulatory Visit: Payer: 59

## 2015-02-03 ENCOUNTER — Encounter: Payer: Self-pay | Admitting: Nurse Practitioner

## 2015-02-03 ENCOUNTER — Other Ambulatory Visit (HOSPITAL_BASED_OUTPATIENT_CLINIC_OR_DEPARTMENT_OTHER): Payer: 59

## 2015-02-03 ENCOUNTER — Ambulatory Visit (HOSPITAL_BASED_OUTPATIENT_CLINIC_OR_DEPARTMENT_OTHER): Payer: 59 | Admitting: Nurse Practitioner

## 2015-02-03 ENCOUNTER — Other Ambulatory Visit: Payer: Self-pay | Admitting: *Deleted

## 2015-02-03 VITALS — BP 115/78 | HR 62 | Temp 98.7°F | Resp 18 | Ht 66.0 in | Wt 148.5 lb

## 2015-02-03 DIAGNOSIS — C50412 Malignant neoplasm of upper-outer quadrant of left female breast: Secondary | ICD-10-CM

## 2015-02-03 DIAGNOSIS — T451X5A Adverse effect of antineoplastic and immunosuppressive drugs, initial encounter: Secondary | ICD-10-CM | POA: Insufficient documentation

## 2015-02-03 DIAGNOSIS — D701 Agranulocytosis secondary to cancer chemotherapy: Secondary | ICD-10-CM

## 2015-02-03 DIAGNOSIS — G620 Drug-induced polyneuropathy: Secondary | ICD-10-CM

## 2015-02-03 LAB — CBC WITH DIFFERENTIAL/PLATELET
BASO%: 1.4 % (ref 0.0–2.0)
BASOS ABS: 0 10*3/uL (ref 0.0–0.1)
EOS ABS: 0 10*3/uL (ref 0.0–0.5)
EOS%: 0.3 % (ref 0.0–7.0)
HCT: 35.5 % (ref 34.8–46.6)
HGB: 11.4 g/dL — ABNORMAL LOW (ref 11.6–15.9)
LYMPH%: 46.4 % (ref 14.0–49.7)
MCH: 30.3 pg (ref 25.1–34.0)
MCHC: 32.2 g/dL (ref 31.5–36.0)
MCV: 94.1 fL (ref 79.5–101.0)
MONO#: 0.4 10*3/uL (ref 0.1–0.9)
MONO%: 14.5 % — ABNORMAL HIGH (ref 0.0–14.0)
NEUT#: 0.9 10*3/uL — ABNORMAL LOW (ref 1.5–6.5)
NEUT%: 37.4 % — ABNORMAL LOW (ref 38.4–76.8)
Platelets: 204 10*3/uL (ref 145–400)
RBC: 3.78 10*6/uL (ref 3.70–5.45)
RDW: 15.5 % — ABNORMAL HIGH (ref 11.2–14.5)
WBC: 2.4 10*3/uL — ABNORMAL LOW (ref 3.9–10.3)
lymph#: 1.1 10*3/uL (ref 0.9–3.3)

## 2015-02-03 LAB — COMPREHENSIVE METABOLIC PANEL (CC13)
ALK PHOS: 49 U/L (ref 40–150)
ALT: 14 U/L (ref 0–55)
ANION GAP: 9 meq/L (ref 3–11)
AST: 22 U/L (ref 5–34)
Albumin: 3.7 g/dL (ref 3.5–5.0)
BUN: 10.9 mg/dL (ref 7.0–26.0)
CALCIUM: 9.3 mg/dL (ref 8.4–10.4)
CHLORIDE: 106 meq/L (ref 98–109)
CO2: 28 meq/L (ref 22–29)
CREATININE: 0.7 mg/dL (ref 0.6–1.1)
EGFR: >90 mL/min/{1.73_m2} (ref >90)
GLUCOSE: 101 mg/dL (ref 70–140)
POTASSIUM: 4 meq/L (ref 3.5–5.1)
Sodium: 142 mEq/L (ref 136–145)
TOTAL PROTEIN: 6 g/dL — AB (ref 6.4–8.3)
Total Bilirubin: 0.65 mg/dL (ref 0.20–1.20)

## 2015-02-03 MED ORDER — GABAPENTIN 300 MG PO CAPS: 300.0000 mg | ORAL_CAPSULE | Freq: Every day | ORAL | Status: DC

## 2015-02-03 NOTE — Telephone Encounter (Signed)
Pt confirmed labs/ov per 02/22 POF, gave pt AVS... KJ, sent msg to r/s today chemo to next Monday and add other treatments.Marland KitchenMarland Kitchen

## 2015-02-03 NOTE — Progress Notes (Signed)
[facility]  Telephone:([phone]) [phone] [phone]     ID: Nancy Austin DOB: May 06, 1976  MR#: [id number]  XMI#:[id number]  Patient Care Team: Logan Bores, MD as PCP - General (Obstetrics and Gynecology) Rolm Bookbinder, MD as Consulting Physician (General Surgery) Blair Promise, MD as Consulting Physician (Radiation Oncology) Chauncey Cruel, MD as Consulting Physician (Oncology) OTHER MD:  CHIEF COMPLAINT: Triple positive breast cancer  CURRENT TREATMENT: neoadjuvant chemotherapy and anti-HER-2 immunotherapy   BREAST CANCER HISTORY: From the original Intake note:  Nancy Austin herself found a lump in her left breast early October 2015 and brought it to her gynecologist attention. On 09/20/2014 she was set up for bilateral diagnostic mammography and left breast ultrasonography of the breast Center. This was the patient's first ever mammogram. In the area of concern in the left breast there was suspicious pleomorphic calcifications spanning 1.1 cm. There was no discrete mammographic mass in this dense breasts (category C.). On physical exam, there was a palpable firm at 1.5 cm mass at the 3:00 position in the left breast. By ultrasound this was irregular and hypoechoic and measured 1.8 cm. Ultrasound of the left axilla was unremarkable. Aside from multiple cysts in the left breast there were no other findings of concern.  On the same day, 09/20/2014, the patient underwent biopsy of the left breast palpable mass. This showed ([id number] [phone]) and invasive ductal carcinoma, grade 2, estrogen receptor 100% positive, progesterone receptor 89% positive, both with strong staining intensity, with an MIB-1 of 16%. HER-2 was amplified with a signals ratio of 5.07 and a copy number per cell of 6.85. Paragraph on 09/30/2014 the patient underwent bilateral breast MRIs. This showed a 1.7 cm mass in the upper outer quadrant of the left breast. There was no other suspicious finding  in either breast and no abnormal appearing adenopathy.  The patient's subsequent history is as detailed below.  INTERVAL HISTORY: Nancy Austin teturns today for follow-up of her breast cancer accompanied by her mother. Today is day 1 cycle 6 of 6 planned cycles of carboplatin, docetaxel, trastuzumab and pertuzumab, with neulasta given on day 2 for granulocyte support.   REVIEW OF SYSTEMS: Nancy Austin denies fevers, chills, nausea, or vomiting. She moves her bowels well if she avoids zofran. She manages her nausea with compazine and lorazepam alone. Her appetite is fair and she is drinking well. She is all recovered from an URI that was managed with antibiotics. She denies shortness of breath, chest pain, cough, or palpitations. She is increasingly fatigued with every cycle. The gabapentin helps with her hot flashes and she is sleeping better at night. She continues to have generalized body aches and takes aleve for this. This week she has neuropathy at the tips of her toes and into the balls of both feet. She has some mild bilateral lower extremity edema that gets better with elevation. A detailed review of systems is otherwise stable.    PAST MEDICAL HISTORY: Past Medical History  Diagnosis Date  . Breast cancer of upper-outer quadrant of left female breast 09/24/2014    PAST SURGICAL HISTORY: Past Surgical History  Procedure Laterality Date  . Tonsillectomy  1996  . Portacath placement N/A 10/15/2014    Procedure: INSERTION PORT-A-CATH;  Surgeon: Rolm Bookbinder, MD;  Location: [facility];  Service: General;  Laterality: N/A;    FAMILY HISTORY Family History  Problem Relation Age of Onset  . Skin cancer Father   . Prostate cancer Maternal Uncle 29    currently  16  . Multiple myeloma Paternal Grandmother 80    Deceased 73  The patient's parents are both living. The patient has one brother, no sisters. One grandmother was diagnosed with multiple myeloma at age 11. There is no history of breast  or ovarian cancer in the family.   GYNECOLOGIC HISTORY:  No LMP recorded.  menarche age 52, first live birth age 39. The patient is GX P2. She still having regular periods. She was on birth control pills on and off for the last 15 years, stopping in October of 2015.   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 Her husband Rolla Plate works as a Immunologist at [employer]. Their children are Sam age 68 and Terrence Dupont age 69.    ADVANCED DIRECTIVES: In place   HEALTH MAINTENANCE: History  Substance Use Topics  . Smoking status: Never Smoker   . Smokeless tobacco: Never Used  . Alcohol Use: 1.8 oz/week    3 Glasses of wine per week     Colonoscopy:  PAP:  November 2014  Bone density:  Lipid panel:  Allergies  Allergen Reactions  . Morphine And Related Nausea And Vomiting    Current Outpatient Prescriptions  Medication Sig Dispense Refill  . diphenhydramine-acetaminophen (TYLENOL PM) 25-500 MG TABS Take 1 tablet by mouth at bedtime as needed (sleep).     . Multiple Vitamin (MULTIVITAMIN WITH MINERALS) TABS tablet Take 1 tablet by mouth every morning.    . Naproxen Sodium (ALEVE) 220 MG CAPS Take 400 mg by mouth daily as needed (pain.).    Marland Kitchen prochlorperazine (COMPAZINE) 10 MG tablet TAKE ONE TABLET BY MOUTH EVERY 6 HOURS AS NEEDED FOR NAUSEA AND VOMITING 30 tablet 2  . valACYclovir (VALTREX) 500 MG tablet Take 1 tablet (500 mg total) by mouth daily. 30 tablet 3  . chlorpheniramine-HYDROcodone (TUSSIONEX PENNKINETIC ER) 10-8 MG/5ML LQCR Take 5 mLs by mouth at bedtime as needed for cough. (Patient not taking: Reported on 02/03/2015) 60 mL 0  . cholestyramine (QUESTRAN) 4 G packet Take 1 packet (4 g total) by mouth 3 (three) times daily with meals. (Patient not taking: Reported on 02/03/2015) 60 each 12  . dexamethasone (DECADRON) 4 MG tablet TAKE TWO TABLETS BY MOUTH TWICE DAILY STARTING DAY BEFORE TAXOTERE THEN AGAIN THE DAY AFTER CHEMO FOR 3 DAYS (Patient not taking:  Reported on 02/03/2015) 30 tablet 2  . Diphenhyd-Hydrocort-Nystatin (FIRST-DUKES MOUTHWASH) SUSP Take 5-10 mLs by mouth 4 (four) times daily as needed (pt may swish spit or swallow). 1 to 1 to 1 ratio (Patient not taking: Reported on 02/03/2015) 240 mL 3  . gabapentin (NEURONTIN) 300 MG capsule Take 1 capsule (300 mg total) by mouth at bedtime. 30 capsule 0  . HYDROcodone-acetaminophen (NORCO) 10-325 MG per tablet Take 1 tablet by mouth every 6 (six) hours as needed. (Patient not taking: Reported on 02/03/2015) 20 tablet 0   No current facility-administered medications for this visit.   Facility-Administered Medications Ordered in Other Visits  Medication Dose Route Frequency Provider Last Rate Last Dose  . 0.9 %  sodium chloride infusion   Intravenous Once Chauncey Cruel, MD        OBJECTIVE: young white woman  in no acute distress  Filed Vitals:   02/03/15 0917  BP: 115/78  Pulse: 62  Temp: 98.7 F (37.1 C)  Resp: 18     Body mass index is 23.98 kg/(m^2).    ECOG FS:1 - Symptomatic but completely ambulatory  Skin: warm, dry  HEENT: sclerae anicteric, conjunctivae pink, oropharynx clear. No thrush or mucositis.  Lymph Nodes: No cervical or supraclavicular lymphadenopathy  Lungs: clear to auscultation bilaterally, no rales, wheezes, or rhonci  Heart: regular rate and rhythm  Abdomen: round, soft, non tender, positive bowel sounds  Musculoskeletal: No focal spinal tenderness, no peripheral edema  Neuro: non focal, well oriented, positive affect Breasts: deferred  LAB RESULTS:  CMP     Component Value Date/Time   NA 142 02/03/2015 0902   NA 144 10/02/2014 1206   K 4.0 02/03/2015 0902   K 3.9 10/02/2014 1206   CL 104 10/02/2014 1206   CO2 28 02/03/2015 0902   CO2 26 10/02/2014 1206   GLUCOSE 101 02/03/2015 0902   GLUCOSE 103* 10/02/2014 1206   BUN 10.9 02/03/2015 0902   BUN 11 10/02/2014 1206   CREATININE 0.7 02/03/2015 0902   CREATININE 0.87 10/02/2014 1206   CALCIUM  9.3 02/03/2015 0902   CALCIUM 9.4 10/02/2014 1206   PROT 6.0* 02/03/2015 0902   PROT 7.3 10/02/2014 1206   ALBUMIN 3.7 02/03/2015 0902   ALBUMIN 3.9 10/02/2014 1206   AST 22 02/03/2015 0902   AST 20 10/02/2014 1206   ALT 14 02/03/2015 0902   ALT 16 10/02/2014 1206   ALKPHOS 49 02/03/2015 0902   ALKPHOS 45 10/02/2014 1206   BILITOT 0.65 02/03/2015 0902   BILITOT 0.4 10/02/2014 1206    I No results found for: SPEP  Lab Results  Component Value Date   WBC 2.4* 02/03/2015   NEUTROABS 0.9* 02/03/2015   HGB 11.4* 02/03/2015   HCT 35.5 02/03/2015   MCV 94.1 02/03/2015   PLT 204 02/03/2015      Chemistry      Component Value Date/Time   NA 142 02/03/2015 0902   NA 144 10/02/2014 1206   K 4.0 02/03/2015 0902   K 3.9 10/02/2014 1206   CL 104 10/02/2014 1206   CO2 28 02/03/2015 0902   CO2 26 10/02/2014 1206   BUN 10.9 02/03/2015 0902   BUN 11 10/02/2014 1206   CREATININE 0.7 02/03/2015 0902   CREATININE 0.87 10/02/2014 1206      Component Value Date/Time   CALCIUM 9.3 02/03/2015 0902   CALCIUM 9.4 10/02/2014 1206   ALKPHOS 49 02/03/2015 0902   ALKPHOS 45 10/02/2014 1206   AST 22 02/03/2015 0902   AST 20 10/02/2014 1206   ALT 14 02/03/2015 0902   ALT 16 10/02/2014 1206   BILITOT 0.65 02/03/2015 0902   BILITOT 0.4 10/02/2014 1206       No results found for: LABCA2  No components found for: LABCA125  No results for input(s): INR in the last 168 hours.  Urinalysis No results found for: COLORURINE  STUDIES: No results found. Most recent echocardiogram on 01/10/15 showed an EF of 55-60%  ASSESSMENT: 39 y.o. BRCA negative [place] woman s/p Left breast upper outer quadrant biopsy 09/20/2014, for a clinical T1c N0, stage IA invasive ductal carcinoma, grade 2, estrogen and progesterone receptor positive, HER-2 amplified with a signal is ratio of 5.07, and an MIB-1 of 16%  (1) neoadjuvant treatment to consist of carboplatin, docetaxel, trastuzumab and pertuzumab  given every 21 days x6, with Neulasta day 2, started 10/21/2014, scheduled to be completed 02/03/2015  (2) definitive surgery to follow chemotherapy   (3) trastuzumab to be continued to total one year  (4) radiation to follow surgery if appropriate  (5) antiestrogens to follow radiation  (6)  the [employer]  geneprofile  ([place] [employer]) obtained November 2015 did not reveal a mutation in ATM, BARD1, BRCA1, BRCA BRIP1, CDH1, CHEK2, MRE11A, MUTYH, NBN, NF1, PALB2, PTEN, RAD50, RAD51C, RAD51D, or TP53  PLAN: The labs were reviewed in detail, and despite neulasta being given with her last cycle, her ANC falls short at 0.9 today. I consulted with Dr. Jana Hakim and he suggests holding treatment this week to allow time for recovery, and completing her 6th and final cycle next week instead. The docetaxel may be held in the case of progressive neuropathy symptoms. Herceptin and perjeta will be given next week as well.   Nancy Austin will meet with Dr. Donne Hazel in early March to discuss surgery. She understands and agrees with this plan. She knows the goal of treatment in her case is cure. She has been encouraged to call with any issues that might arise before her next visit here.  Marcelino Duster, NP   02/03/2015 10:50 AM

## 2015-02-03 NOTE — Telephone Encounter (Signed)
Per staff message and POF I have scheduled appts. Advised scheduler of appts. JMW  

## 2015-02-03 NOTE — Telephone Encounter (Signed)
S/w pt confirming labs/chemo on 02/29, moved labs closer to chemo time.... KJ

## 2015-02-04 ENCOUNTER — Telehealth: Payer: Self-pay | Admitting: Oncology

## 2015-02-04 NOTE — Telephone Encounter (Signed)
pt cld & left a vm in re to inj 2/24-wanted to CX unable to take chemo due to counts-CX appts

## 2015-02-05 ENCOUNTER — Ambulatory Visit: Payer: 59

## 2015-02-10 ENCOUNTER — Ambulatory Visit (HOSPITAL_BASED_OUTPATIENT_CLINIC_OR_DEPARTMENT_OTHER): Payer: 59

## 2015-02-10 ENCOUNTER — Other Ambulatory Visit: Payer: Self-pay | Admitting: Oncology

## 2015-02-10 ENCOUNTER — Other Ambulatory Visit: Payer: Self-pay | Admitting: Nurse Practitioner

## 2015-02-10 ENCOUNTER — Other Ambulatory Visit (HOSPITAL_BASED_OUTPATIENT_CLINIC_OR_DEPARTMENT_OTHER): Payer: 59

## 2015-02-10 DIAGNOSIS — Z5111 Encounter for antineoplastic chemotherapy: Secondary | ICD-10-CM

## 2015-02-10 DIAGNOSIS — C50412 Malignant neoplasm of upper-outer quadrant of left female breast: Secondary | ICD-10-CM

## 2015-02-10 DIAGNOSIS — Z5112 Encounter for antineoplastic immunotherapy: Secondary | ICD-10-CM

## 2015-02-10 LAB — COMPREHENSIVE METABOLIC PANEL (CC13)
ALT: 19 U/L (ref 0–55)
AST: 24 U/L (ref 5–34)
Albumin: 3.7 g/dL (ref 3.5–5.0)
Alkaline Phosphatase: 53 U/L (ref 40–150)
Anion Gap: 6 mEq/L (ref 3–11)
BUN: 8.5 mg/dL (ref 7.0–26.0)
CALCIUM: 9 mg/dL (ref 8.4–10.4)
CHLORIDE: 108 meq/L (ref 98–109)
CO2: 27 mEq/L (ref 22–29)
Creatinine: 0.7 mg/dL (ref 0.6–1.1)
GLUCOSE: 100 mg/dL (ref 70–140)
Potassium: 3.8 mEq/L (ref 3.5–5.1)
SODIUM: 140 meq/L (ref 136–145)
Total Bilirubin: 0.57 mg/dL (ref 0.20–1.20)
Total Protein: 6.1 g/dL — ABNORMAL LOW (ref 6.4–8.3)

## 2015-02-10 LAB — CBC WITH DIFFERENTIAL/PLATELET
BASO%: 0.4 % (ref 0.0–2.0)
Basophils Absolute: 0 10*3/uL (ref 0.0–0.1)
EOS%: 1.1 % (ref 0.0–7.0)
Eosinophils Absolute: 0 10*3/uL (ref 0.0–0.5)
HCT: 36.5 % (ref 34.8–46.6)
HGB: 11.8 g/dL (ref 11.6–15.9)
LYMPH%: 36.1 % (ref 14.0–49.7)
MCH: 30.5 pg (ref 25.1–34.0)
MCHC: 32.3 g/dL (ref 31.5–36.0)
MCV: 94.4 fL (ref 79.5–101.0)
MONO#: 0.2 10*3/uL (ref 0.1–0.9)
MONO%: 8.9 % (ref 0.0–14.0)
NEUT#: 1.5 10*3/uL (ref 1.5–6.5)
NEUT%: 53.5 % (ref 38.4–76.8)
PLATELETS: 214 10*3/uL (ref 145–400)
RBC: 3.87 10*6/uL (ref 3.70–5.45)
RDW: 14.2 % (ref 11.2–14.5)
WBC: 2.8 10*3/uL — AB (ref 3.9–10.3)
lymph#: 1 10*3/uL (ref 0.9–3.3)

## 2015-02-10 MED ORDER — ACETAMINOPHEN 325 MG PO TABS
650.0000 mg | ORAL_TABLET | Freq: Once | ORAL | Status: AC
  Administered 2015-02-10: 650 mg via ORAL

## 2015-02-10 MED ORDER — ONDANSETRON 16 MG/50ML IVPB (CHCC)
16.0000 mg | Freq: Once | INTRAVENOUS | Status: AC
  Administered 2015-02-10: 16 mg via INTRAVENOUS

## 2015-02-10 MED ORDER — DIPHENHYDRAMINE HCL 25 MG PO CAPS
ORAL_CAPSULE | ORAL | Status: AC
  Filled 2015-02-10: qty 2

## 2015-02-10 MED ORDER — DIPHENHYDRAMINE HCL 25 MG PO CAPS
50.0000 mg | ORAL_CAPSULE | Freq: Once | ORAL | Status: AC
  Administered 2015-02-10: 50 mg via ORAL

## 2015-02-10 MED ORDER — ACETAMINOPHEN 325 MG PO TABS
ORAL_TABLET | ORAL | Status: AC
  Filled 2015-02-10: qty 2

## 2015-02-10 MED ORDER — ONDANSETRON 16 MG/50ML IVPB (CHCC)
INTRAVENOUS | Status: AC
  Filled 2015-02-10: qty 16

## 2015-02-10 MED ORDER — CARBOPLATIN CHEMO INJECTION 600 MG/60ML
627.5000 mg | Freq: Once | INTRAVENOUS | Status: AC
  Administered 2015-02-10: 630 mg via INTRAVENOUS
  Filled 2015-02-10: qty 63

## 2015-02-10 MED ORDER — HEPARIN SOD (PORK) LOCK FLUSH 100 UNIT/ML IV SOLN
500.0000 [IU] | Freq: Once | INTRAVENOUS | Status: AC | PRN
  Administered 2015-02-10: 500 [IU]
  Filled 2015-02-10: qty 5

## 2015-02-10 MED ORDER — DEXAMETHASONE SODIUM PHOSPHATE 20 MG/5ML IJ SOLN
20.0000 mg | Freq: Once | INTRAMUSCULAR | Status: AC
  Administered 2015-02-10: 20 mg via INTRAVENOUS

## 2015-02-10 MED ORDER — SODIUM CHLORIDE 0.9 % IJ SOLN
10.0000 mL | INTRAMUSCULAR | Status: DC | PRN
Start: 2015-02-10 — End: 2015-02-10
  Administered 2015-02-10: 10 mL
  Filled 2015-02-10: qty 10

## 2015-02-10 MED ORDER — DEXAMETHASONE SODIUM PHOSPHATE 20 MG/5ML IJ SOLN
INTRAMUSCULAR | Status: AC
  Filled 2015-02-10: qty 5

## 2015-02-10 MED ORDER — SODIUM CHLORIDE 0.9 % IV SOLN
420.0000 mg | Freq: Once | INTRAVENOUS | Status: AC
  Administered 2015-02-10: 420 mg via INTRAVENOUS
  Filled 2015-02-10: qty 14

## 2015-02-10 MED ORDER — TRASTUZUMAB CHEMO INJECTION 440 MG
6.0000 mg/kg | Freq: Once | INTRAVENOUS | Status: AC
  Administered 2015-02-10: 399 mg via INTRAVENOUS
  Filled 2015-02-10: qty 19

## 2015-02-10 MED ORDER — SODIUM CHLORIDE 0.9 % IV SOLN
Freq: Once | INTRAVENOUS | Status: AC
  Administered 2015-02-10: 12:00:00 via INTRAVENOUS

## 2015-02-10 NOTE — Patient Instructions (Signed)
Nancy Austin Discharge Instructions for Patients Receiving Chemotherapy  Today you received the following chemotherapy agents Carbo, Perjeta, Herceptin  To help prevent nausea and vomiting after your treatment, we encourage you to take your nausea medication as directed/prescribed    If you develop nausea and vomiting that is not controlled by your nausea medication, call the clinic.   BELOW ARE SYMPTOMS THAT SHOULD BE REPORTED IMMEDIATELY:  *FEVER GREATER THAN 100.5 F  *CHILLS WITH OR WITHOUT FEVER  NAUSEA AND VOMITING THAT IS NOT CONTROLLED WITH YOUR NAUSEA MEDICATION  *UNUSUAL SHORTNESS OF BREATH  *UNUSUAL BRUISING OR BLEEDING  TENDERNESS IN MOUTH AND THROAT WITH OR WITHOUT PRESENCE OF ULCERS  *URINARY PROBLEMS  *BOWEL PROBLEMS  UNUSUAL RASH Items with * indicate a potential emergency and should be followed up as soon as possible.  Feel free to call the clinic you have any questions or concerns. The clinic phone number is [phone].

## 2015-02-11 ENCOUNTER — Ambulatory Visit (HOSPITAL_COMMUNITY)
Admission: RE | Admit: 2015-02-11 | Discharge: 2015-02-11 | Disposition: A | Discharge status: Home / Self Care | Payer: 59 | Source: Ambulatory Visit | Attending: Oncology | Admitting: Oncology

## 2015-02-11 ENCOUNTER — Other Ambulatory Visit: Payer: Self-pay | Admitting: Oncology

## 2015-02-11 DIAGNOSIS — C50919 Malignant neoplasm of unspecified site of unspecified female breast: Secondary | ICD-10-CM

## 2015-02-11 MED ORDER — GADOBENATE DIMEGLUMINE 529 MG/ML IV SOLN
15.0000 mL | Freq: Once | INTRAVENOUS | Status: AC | PRN
  Administered 2015-02-11: 13 mL via INTRAVENOUS

## 2015-02-12 ENCOUNTER — Ambulatory Visit (HOSPITAL_BASED_OUTPATIENT_CLINIC_OR_DEPARTMENT_OTHER): Payer: 59

## 2015-02-12 DIAGNOSIS — D701 Agranulocytosis secondary to cancer chemotherapy: Secondary | ICD-10-CM

## 2015-02-12 DIAGNOSIS — Z5189 Encounter for other specified aftercare: Secondary | ICD-10-CM

## 2015-02-12 DIAGNOSIS — T451X5A Adverse effect of antineoplastic and immunosuppressive drugs, initial encounter: Principal | ICD-10-CM

## 2015-02-12 DIAGNOSIS — C50412 Malignant neoplasm of upper-outer quadrant of left female breast: Secondary | ICD-10-CM

## 2015-02-12 MED ORDER — PEGFILGRASTIM INJECTION 6 MG/0.6ML ~~LOC~~
6.0000 mg | PREFILLED_SYRINGE | Freq: Once | SUBCUTANEOUS | Status: AC
  Administered 2015-02-12: 6 mg via SUBCUTANEOUS
  Filled 2015-02-12: qty 0.6

## 2015-02-14 ENCOUNTER — Other Ambulatory Visit (HOSPITAL_BASED_OUTPATIENT_CLINIC_OR_DEPARTMENT_OTHER): Payer: 59

## 2015-02-14 ENCOUNTER — Ambulatory Visit (HOSPITAL_BASED_OUTPATIENT_CLINIC_OR_DEPARTMENT_OTHER): Payer: 59 | Admitting: Oncology

## 2015-02-14 VITALS — BP 127/85 | HR 70 | Temp 98.1°F | Resp 18 | Ht 66.0 in | Wt 144.0 lb

## 2015-02-14 DIAGNOSIS — G620 Drug-induced polyneuropathy: Secondary | ICD-10-CM

## 2015-02-14 DIAGNOSIS — C50412 Malignant neoplasm of upper-outer quadrant of left female breast: Secondary | ICD-10-CM

## 2015-02-14 DIAGNOSIS — C50812 Malignant neoplasm of overlapping sites of left female breast: Secondary | ICD-10-CM

## 2015-02-14 DIAGNOSIS — T451X5A Adverse effect of antineoplastic and immunosuppressive drugs, initial encounter: Secondary | ICD-10-CM

## 2015-02-14 DIAGNOSIS — Z170 Estrogen receptor positive status [ER+]: Secondary | ICD-10-CM

## 2015-02-14 DIAGNOSIS — D701 Agranulocytosis secondary to cancer chemotherapy: Secondary | ICD-10-CM

## 2015-02-14 LAB — CBC WITH DIFFERENTIAL/PLATELET
BASO%: 0.1 % (ref 0.0–2.0)
BASOS ABS: 0 10*3/uL (ref 0.0–0.1)
EOS ABS: 0.1 10*3/uL (ref 0.0–0.5)
EOS%: 0.1 % (ref 0.0–7.0)
HCT: 39 % (ref 34.8–46.6)
HEMOGLOBIN: 12.3 g/dL (ref 11.6–15.9)
LYMPH%: 3 % — ABNORMAL LOW (ref 14.0–49.7)
MCH: 30 pg (ref 25.1–34.0)
MCHC: 31.6 g/dL (ref 31.5–36.0)
MCV: 95 fL (ref 79.5–101.0)
MONO#: 0.7 10*3/uL (ref 0.1–0.9)
MONO%: 1.9 % (ref 0.0–14.0)
NEUT#: 35.6 10*3/uL — ABNORMAL HIGH (ref 1.5–6.5)
NEUT%: 94.9 % — ABNORMAL HIGH (ref 38.4–76.8)
PLATELETS: 186 10*3/uL (ref 145–400)
RBC: 4.11 10*6/uL (ref 3.70–5.45)
RDW: 13.9 % (ref 11.2–14.5)
WBC: 37.5 10*3/uL — ABNORMAL HIGH (ref 3.9–10.3)
lymph#: 1.1 10*3/uL (ref 0.9–3.3)

## 2015-02-14 LAB — COMPREHENSIVE METABOLIC PANEL (CC13)
ALK PHOS: 69 U/L (ref 40–150)
ALT: 17 U/L (ref 0–55)
AST: 20 U/L (ref 5–34)
Albumin: 3.8 g/dL (ref 3.5–5.0)
Anion Gap: 8 mEq/L (ref 3–11)
BUN: 7.3 mg/dL (ref 7.0–26.0)
CO2: 26 mEq/L (ref 22–29)
Calcium: 9.3 mg/dL (ref 8.4–10.4)
Chloride: 102 mEq/L (ref 98–109)
Creatinine: 0.7 mg/dL (ref 0.6–1.1)
Glucose: 100 mg/dl (ref 70–140)
POTASSIUM: 4.2 meq/L (ref 3.5–5.1)
SODIUM: 137 meq/L (ref 136–145)
TOTAL PROTEIN: 6.3 g/dL — AB (ref 6.4–8.3)
Total Bilirubin: 0.71 mg/dL (ref 0.20–1.20)

## 2015-02-14 NOTE — Progress Notes (Signed)
[facility]  [phone]) [phone] Fax:(336) [phone]     ID: Nancy Austin Madonna DOB: [birth year]  MR#: [id number]  FFM#:[id number]  Patient Care Team: Logan Bores, MD as PCP - General (Obstetrics and Gynecology) Rolm Bookbinder, MD as Consulting Physician (General Surgery) Blair Promise, MD as Consulting Physician (Radiation Oncology) Chauncey Cruel, MD as Consulting Physician (Oncology) OTHER MD:  CHIEF COMPLAINT: Triple positive breast cancer  CURRENT TREATMENT: Definitive surgery pending; continuing anti-HER-2 immunotherapy   BREAST CANCER HISTORY: From the original Intake note:  Nancy Austin herself found a lump in her left breast early October 2015 and brought it to her gynecologist attention. On 09/20/2014 she was set up for bilateral diagnostic mammography and left breast ultrasonography of the breast Center. This was the patient's first ever mammogram. In the area of concern in the left breast there was suspicious pleomorphic calcifications spanning 1.1 cm. There was no discrete mammographic mass in this dense breasts (category C.). On physical exam, there was a palpable firm at 1.5 cm mass at the 3:00 position in the left breast. By ultrasound this was irregular and hypoechoic and measured 1.8 cm. Ultrasound of the left axilla was unremarkable. Aside from multiple cysts in the left breast there were no other findings of concern.  On the same day, 09/20/2014, the patient underwent biopsy of the left breast palpable mass. This showed ([id number]) and invasive ductal carcinoma, grade 2, estrogen receptor 100% positive, progesterone receptor 89% positive, both with strong staining intensity, with an MIB-1 of 16%. HER-2 was amplified with a signals ratio of 5.07 and a copy number per cell of 6.85. Paragraph on 09/30/2014 the patient underwent bilateral breast MRIs. This showed a 1.7 cm mass in the upper outer quadrant of the left breast. There was no other suspicious  finding in either breast and no abnormal appearing adenopathy.  The patient's subsequent history is as detailed below.  INTERVAL HISTORY: Nancy Austin teturns today for follow-up of her breast cancer accompanied by her husband Logan.. Today is day 5 cycle 6 of 6 planned cycles of carboplatin, docetaxel, trastuzumab and pertuzumab, with neulasta given on day 2 for granulocyte support. She has completed her chemotherapy. The MRI she had 2 days ago shows a complete radiologic response. She is now ready to proceed to surgery  REVIEW OF SYSTEMS: Nancy Austin did remarkably well with her chemotherapy. She has had minimal blurred vision issues. She had mouth sores minimally originally, and these have not recurred while on Valtrex (she will take the remaining Valtrex pills and then stop). She never had any significant nausea or vomiting problems. She did develop some neuropathy chiefly involving the feet. Currently she has minimal numbness in her toe tips only bilaterally. She has no neuropathy involving the hands. There is no tingling or pain associated with this. She is moderately fatigued, and is not yet doing all her normal activities of daily living (children, housework, etc.): Her mother is helping out. She is not anemic however. She is trying to take little walks here in there to get herself back in shape. She developed mild diarrhea from the progenitor, but that has resolved area she is having a little bit of ankle swelling likely secondary to the docetaxel. That also will resolve on its oh. A detailed review of systems today was otherwise noncontributory  PAST MEDICAL HISTORY: Past Medical History  Diagnosis Date  . Breast cancer of upper-outer quadrant of left female breast 09/24/2014    PAST SURGICAL HISTORY: Past Surgical History  Procedure Laterality Date  . Tonsillectomy  1996  . Portacath placement N/A 10/15/2014    Procedure: INSERTION PORT-A-CATH;  Surgeon: Rolm Bookbinder, MD;  Location: [facility];  Service: General;  Laterality: N/A;    FAMILY HISTORY Family History  Problem Relation Age of Onset  . Skin cancer Father   . Prostate cancer Maternal Uncle 48    currently 41  . Multiple myeloma Paternal Grandmother 42    Deceased 13  The patient's parents are both living. The patient has one brother, no sisters. One grandmother was diagnosed with multiple myeloma at age 10. There is no history of breast or ovarian cancer in the family.   GYNECOLOGIC HISTORY:  Patient's last menstrual period was 10/27/2014.  menarche age 51, first live birth age 7. The patient is GX P2. She still having regular periods. She was on birth control pills on and off for the last 15 years, stopping in October of 2015.   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 Her husband Rolla Plate works as a Immunologist at [employer]. Their children are Sam age 51 and Terrence Dupont age 23.    ADVANCED DIRECTIVES: In place   HEALTH MAINTENANCE: History  Substance Use Topics  . Smoking status: Never Smoker   . Smokeless tobacco: Never Used  . Alcohol Use: 1.8 oz/week    3 Glasses of wine per week     Colonoscopy:  PAP:  November 2014  Bone density:  Lipid panel:  Allergies  Allergen Reactions  . Morphine And Related Nausea And Vomiting    Current Outpatient Prescriptions  Medication Sig Dispense Refill  . chlorpheniramine-HYDROcodone (TUSSIONEX PENNKINETIC ER) 10-8 MG/5ML LQCR Take 5 mLs by mouth at bedtime as needed for cough. (Patient not taking: Reported on 02/03/2015) 60 mL 0  . cholestyramine (QUESTRAN) 4 G packet Take 1 packet (4 g total) by mouth 3 (three) times daily with meals. (Patient not taking: Reported on 02/03/2015) 60 each 12  . dexamethasone (DECADRON) 4 MG tablet TAKE TWO TABLETS BY MOUTH TWICE DAILY STARTING DAY BEFORE TAXOTERE THEN AGAIN THE DAY AFTER CHEMO FOR 3 DAYS (Patient not taking: Reported on 02/03/2015) 30 tablet 2  . Diphenhyd-Hydrocort-Nystatin (FIRST-DUKES  MOUTHWASH) SUSP Take 5-10 mLs by mouth 4 (four) times daily as needed (pt may swish spit or swallow). 1 to 1 to 1 ratio (Patient not taking: Reported on 02/03/2015) 240 mL 3  . diphenhydramine-acetaminophen (TYLENOL PM) 25-500 MG TABS Take 1 tablet by mouth at bedtime as needed (sleep).     . gabapentin (NEURONTIN) 300 MG capsule Take 1 capsule (300 mg total) by mouth at bedtime. 30 capsule 0  . HYDROcodone-acetaminophen (NORCO) 10-325 MG per tablet Take 1 tablet by mouth every 6 (six) hours as needed. (Patient not taking: Reported on 02/03/2015) 20 tablet 0  . Multiple Vitamin (MULTIVITAMIN WITH MINERALS) TABS tablet Take 1 tablet by mouth every morning.    . Naproxen Sodium (ALEVE) 220 MG CAPS Take 400 mg by mouth daily as needed (pain.).    Marland Kitchen prochlorperazine (COMPAZINE) 10 MG tablet TAKE ONE TABLET BY MOUTH EVERY 6 HOURS AS NEEDED FOR NAUSEA AND VOMITING 30 tablet 2  . valACYclovir (VALTREX) 500 MG tablet TAKE ONE TABLET BY MOUTH EVERY DAY 30 tablet 3   No current facility-administered medications for this visit.   Facility-Administered Medications Ordered in Other Visits  Medication Dose Route Frequency Provider Last Rate Last Dose  . 0.9 %  sodium chloride infusion  Intravenous Once Chauncey Cruel, MD        OBJECTIVE: young white woman who appears stated age 89 Vitals:   02/14/15 0904  BP: 127/85  Pulse: 70  Temp: 98.1 F (36.7 C)  Resp: 18     Body mass index is 23.25 kg/(m^2).    ECOG FS:1 - Symptomatic but completely ambulatory  Sclerae unicteric, pupils equal and reactive Oropharynx clear and moist-- no thrush or other lesions No cervical or supraclavicular adenopathy Lungs no rales or rhonchi Heart regular rate and rhythm Abd soft, nontender, positive bowel sounds MSK no focal spinal tenderness, no upper extremity lymphedema Neuro: nonfocal, well oriented, appropriate affect Breasts: the right breast is unremarkable. I do not palpate any masses in the left breast or  axilla. There are no skin or nipple changes of concern.    LAB RESULTS:  CMP     Component Value Date/Time   NA 140 02/10/2015 1041   NA 144 10/02/2014 1206   K 3.8 02/10/2015 1041   K 3.9 10/02/2014 1206   CL 104 10/02/2014 1206   CO2 27 02/10/2015 1041   CO2 26 10/02/2014 1206   GLUCOSE 100 02/10/2015 1041   GLUCOSE 103* 10/02/2014 1206   BUN 8.5 02/10/2015 1041   BUN 11 10/02/2014 1206   CREATININE 0.7 02/10/2015 1041   CREATININE 0.87 10/02/2014 1206   CALCIUM 9.0 02/10/2015 1041   CALCIUM 9.4 10/02/2014 1206   PROT 6.1* 02/10/2015 1041   PROT 7.3 10/02/2014 1206   ALBUMIN 3.7 02/10/2015 1041   ALBUMIN 3.9 10/02/2014 1206   AST 24 02/10/2015 1041   AST 20 10/02/2014 1206   ALT 19 02/10/2015 1041   ALT 16 10/02/2014 1206   ALKPHOS 53 02/10/2015 1041   ALKPHOS 45 10/02/2014 1206   BILITOT 0.57 02/10/2015 1041   BILITOT 0.4 10/02/2014 1206    I No results found for: SPEP  Lab Results  Component Value Date   WBC 37.5* 02/14/2015   NEUTROABS 35.6* 02/14/2015   HGB 12.3 02/14/2015   HCT 39.0 02/14/2015   MCV 95.0 02/14/2015   PLT 186 02/14/2015      Chemistry      Component Value Date/Time   NA 140 02/10/2015 1041   NA 144 10/02/2014 1206   K 3.8 02/10/2015 1041   K 3.9 10/02/2014 1206   CL 104 10/02/2014 1206   CO2 27 02/10/2015 1041   CO2 26 10/02/2014 1206   BUN 8.5 02/10/2015 1041   BUN 11 10/02/2014 1206   CREATININE 0.7 02/10/2015 1041   CREATININE 0.87 10/02/2014 1206      Component Value Date/Time   CALCIUM 9.0 02/10/2015 1041   CALCIUM 9.4 10/02/2014 1206   ALKPHOS 53 02/10/2015 1041   ALKPHOS 45 10/02/2014 1206   AST 24 02/10/2015 1041   AST 20 10/02/2014 1206   ALT 19 02/10/2015 1041   ALT 16 10/02/2014 1206   BILITOT 0.57 02/10/2015 1041   BILITOT 0.4 10/02/2014 1206       No results found for: LABCA2  No components found for: LABCA125  No results for input(s): INR in the last 168 hours.  Urinalysis No results found for:  COLORURINE  STUDIES: Mr Breast Bilateral W Wo Contrast  02/11/2015   CLINICAL DATA:  Biopsy proven left breast 3 o'clock location invasive ductal carcinoma. Assess response to neoadjuvant chemotherapy.  LABS:  Not obtained  EXAM: BILATERAL BREAST MRI WITH AND WITHOUT CONTRAST  TECHNIQUE: Multiplanar, multisequence MR images of both  breasts were obtained prior to and following the intravenous administration of 13 ml of Multihance.  THREE-DIMENSIONAL MR IMAGE RENDERING ON INDEPENDENT WORKSTATION:  Three-dimensional MR images were rendered by post-processing of the original MR data on an independent workstation. The three-dimensional MR images were interpreted, and findings are reported in the following complete MRI report for this study. Three dimensional images were evaluated at the independent DynaCad workstation  COMPARISON:  Previous exam(s).  FINDINGS: Breast composition: d.  Extreme fibroglandular tissue.  Background parenchymal enhancement: Mild  Right breast: No mass or abnormal enhancement.  Left breast: No residual enhancement is identified in the left breast 3 o'clock location adjacent clip artifact indicating the location of previously biopsy proven malignancy.  Lymph nodes: Normal-sized bilateral axillary lymph nodes are identified with symmetric cortical thickness at upper limits of normal but no other suspicious feature.  Ancillary findings: Bilateral T2 hyperintense cysts are identified with move peripheral rim enhancement.  IMPRESSION: No residual enhancement at the site of biopsy proven left breast 3 o'clock location malignancy, indicating good response to treatment. No new area of abnormal enhancement in either breast.  RECOMMENDATION: Treatment plan  BI-RADS CATEGORY  6: Known biopsy-proven malignancy.   Electronically Signed   By: Conchita Paris M.D.   On: 02/11/2015 14:11    ASSESSMENT: 39 y.o. BRCA negative [place] woman s/p Left breast upper outer quadrant biopsy 09/20/2014, for a  clinical T1c N0, stage IA invasive ductal carcinoma, grade 2, estrogen and progesterone receptor positive, HER-2 amplified with a signal is ratio of 5.07, and an MIB-1 of 16%  (1) neoadjuvant treatment consisting of carboplatin, docetaxel, trastuzumab and pertuzumab given every 21 days x6,  completed 02/10/2015  (a) breast MRI 02/11/2015 shows a complete radiologic response  (2) definitive surgery to follow chemotherapy   (3) trastuzumab to be continued to total one year (through October 2016)  (a) most recent echo 01/10/2015 shows an EF of 55-60%  (4) radiation to follow surgery if appropriate  (5) antiestrogens to follow radiation  (6)  the [facility] geneprofile  ([facility] genetics) obtained November 2015 did not reveal a mutation in ATM, BARD1, BRCA1, BRCA BRIP1, CDH1, CHEK2, MRE11A, MUTYH, NBN, NF1, PALB2, PTEN, RAD50, RAD51C, RAD51D, or TP53  PLAN: Nancy Austin has completed her neoadjuvant chemotherapy. She generally tolerated it quite well. She still has minimal, clearly grade 1 neuropathy involving the tips of the toes. The minimal swelling she has in the lower extremities is due to the docetaxel and will resolve in the next couple of weeks. She has maintained an excellent ejection fraction.  More importantly she has had a complete radiologic response. The correlation between this and a complete pathologic response is 90%. I am accordingly very hopeful that we're going to get good news when she has her definitive surgery.  She will see Dr. Donne Hazel next week. I anticipate she will be having surgery later this month. She will start trastuzumab alone 03/04/2015, to be repeated every 3 weeks through October of this year. Her next echocardiogram has been scheduled for late April and she will see me again the first Tuesday in May. By then I anticipate she will be ready to start anti-estrogens.  We did discuss results of the TEXT study and she understands if her periods were to resume we would  either proceed to goserelin monthly or bilateral salpingo-oophorectomy. At this point however she has had no periods since November 2015.  Nancy Austin has a good understanding of the overall plan. She noted only agrees with it  but is very pleased with the results so far. She will call with any problems that may develop before her next visit here. Chauncey Cruel, MD   02/14/2015 9:12 AM

## 2015-02-19 ENCOUNTER — Other Ambulatory Visit (INDEPENDENT_AMBULATORY_CARE_PROVIDER_SITE_OTHER): Payer: Self-pay | Admitting: General Surgery

## 2015-02-19 DIAGNOSIS — C50412 Malignant neoplasm of upper-outer quadrant of left female breast: Secondary | ICD-10-CM

## 2015-02-24 ENCOUNTER — Telehealth: Payer: Self-pay | Admitting: *Deleted

## 2015-02-24 NOTE — Telephone Encounter (Signed)
Pt called and relayed she started her period yesterday after not having a period since Nov 2015. Informed Dr. Jana Hakim. Per Dr. Jana Hakim pt will start zoladex after surgery which is scheduled for 03/06/15. Gave pt this information. Received verbal understanding.

## 2015-02-25 ENCOUNTER — Other Ambulatory Visit: Payer: Self-pay | Admitting: Oncology

## 2015-02-25 ENCOUNTER — Telehealth: Payer: Self-pay | Admitting: Oncology

## 2015-02-25 NOTE — Telephone Encounter (Signed)
per pof to sch pt appt-sent Anne/Melissa email to override for appt @ 8:15 pe rGM for 45 min 4/12-pt aware

## 2015-02-28 ENCOUNTER — Encounter (HOSPITAL_BASED_OUTPATIENT_CLINIC_OR_DEPARTMENT_OTHER): Payer: Self-pay | Admitting: *Deleted

## 2015-02-28 NOTE — Pre-Procedure Instructions (Signed)
To come for BMET and CBC, diff

## 2015-03-04 ENCOUNTER — Ambulatory Visit (HOSPITAL_BASED_OUTPATIENT_CLINIC_OR_DEPARTMENT_OTHER): Payer: 59

## 2015-03-04 ENCOUNTER — Ambulatory Visit: Payer: 59

## 2015-03-04 ENCOUNTER — Other Ambulatory Visit (HOSPITAL_BASED_OUTPATIENT_CLINIC_OR_DEPARTMENT_OTHER): Payer: 59

## 2015-03-04 DIAGNOSIS — Z5112 Encounter for antineoplastic immunotherapy: Secondary | ICD-10-CM

## 2015-03-04 DIAGNOSIS — C50812 Malignant neoplasm of overlapping sites of left female breast: Secondary | ICD-10-CM | POA: Diagnosis not present

## 2015-03-04 DIAGNOSIS — C50412 Malignant neoplasm of upper-outer quadrant of left female breast: Secondary | ICD-10-CM

## 2015-03-04 LAB — CBC WITH DIFFERENTIAL/PLATELET
BASO%: 0.4 % (ref 0.0–2.0)
Basophils Absolute: 0 10*3/uL (ref 0.0–0.1)
EOS%: 0.8 % (ref 0.0–7.0)
Eosinophils Absolute: 0 10*3/uL (ref 0.0–0.5)
HCT: 37.3 % (ref 34.8–46.6)
HGB: 12.2 g/dL (ref 11.6–15.9)
LYMPH%: 50 % — ABNORMAL HIGH (ref 14.0–49.7)
MCH: 31 pg (ref 25.1–34.0)
MCHC: 32.7 g/dL (ref 31.5–36.0)
MCV: 94.7 fL (ref 79.5–101.0)
MONO#: 0.2 10*3/uL (ref 0.1–0.9)
MONO%: 6.7 % (ref 0.0–14.0)
NEUT#: 1 10*3/uL — ABNORMAL LOW (ref 1.5–6.5)
NEUT%: 42.1 % (ref 38.4–76.8)
PLATELETS: 140 10*3/uL — AB (ref 145–400)
RBC: 3.94 10*6/uL (ref 3.70–5.45)
RDW: 13.2 % (ref 11.2–14.5)
WBC: 2.4 10*3/uL — ABNORMAL LOW (ref 3.9–10.3)
lymph#: 1.2 10*3/uL (ref 0.9–3.3)

## 2015-03-04 LAB — COMPREHENSIVE METABOLIC PANEL (CC13)
ALBUMIN: 3.8 g/dL (ref 3.5–5.0)
ALT: 24 U/L (ref 0–55)
ANION GAP: 10 meq/L (ref 3–11)
AST: 24 U/L (ref 5–34)
Alkaline Phosphatase: 65 U/L (ref 40–150)
BUN: 6.7 mg/dL — ABNORMAL LOW (ref 7.0–26.0)
CHLORIDE: 107 meq/L (ref 98–109)
CO2: 26 meq/L (ref 22–29)
Calcium: 8.9 mg/dL (ref 8.4–10.4)
Creatinine: 0.7 mg/dL (ref 0.6–1.1)
EGFR: >90 mL/min/{1.73_m2} (ref >90)
GLUCOSE: 93 mg/dL (ref 70–140)
POTASSIUM: 4.1 meq/L (ref 3.5–5.1)
SODIUM: 142 meq/L (ref 136–145)
TOTAL PROTEIN: 6.4 g/dL (ref 6.4–8.3)
Total Bilirubin: 0.48 mg/dL (ref 0.20–1.20)

## 2015-03-04 MED ORDER — SODIUM CHLORIDE 0.9 % IV SOLN
Freq: Once | INTRAVENOUS | Status: AC
Start: 2015-03-04 — End: 2015-03-04
  Administered 2015-03-04: 09:00:00 via INTRAVENOUS

## 2015-03-04 MED ORDER — DIPHENHYDRAMINE HCL 25 MG PO CAPS
25.0000 mg | ORAL_CAPSULE | Freq: Once | ORAL | Status: AC
  Administered 2015-03-04: 25 mg via ORAL

## 2015-03-04 MED ORDER — DIPHENHYDRAMINE HCL 25 MG PO CAPS
ORAL_CAPSULE | ORAL | Status: AC
  Filled 2015-03-04: qty 1

## 2015-03-04 MED ORDER — ACETAMINOPHEN 325 MG PO TABS
650.0000 mg | ORAL_TABLET | Freq: Once | ORAL | Status: AC
  Administered 2015-03-04: 650 mg via ORAL

## 2015-03-04 MED ORDER — ACETAMINOPHEN 325 MG PO TABS
ORAL_TABLET | ORAL | Status: AC
  Filled 2015-03-04: qty 2

## 2015-03-04 MED ORDER — TRASTUZUMAB CHEMO INJECTION 440 MG
6.0000 mg/kg | Freq: Once | INTRAVENOUS | Status: AC
  Administered 2015-03-04: 399 mg via INTRAVENOUS
  Filled 2015-03-04: qty 19

## 2015-03-04 MED ORDER — SODIUM CHLORIDE 0.9 % IJ SOLN
10.0000 mL | INTRAMUSCULAR | Status: DC | PRN
  Administered 2015-03-04: 10 mL
  Filled 2015-03-04: qty 10

## 2015-03-04 MED ORDER — HEPARIN SOD (PORK) LOCK FLUSH 100 UNIT/ML IV SOLN
500.0000 [IU] | Freq: Once | INTRAVENOUS | Status: AC | PRN
  Administered 2015-03-04: 500 [IU]
  Filled 2015-03-04: qty 5

## 2015-03-04 NOTE — Progress Notes (Signed)
Dr. Virgie Dad nurse, [facility] RN notified of patient's WBC and ANC (pt.due to have surgery 3/24)

## 2015-03-04 NOTE — Patient Instructions (Signed)
Nancy Austin Discharge Instructions for Patients Receiving Chemotherapy  Today you received the following chemotherapy agent, Herceptin.  To help prevent nausea and vomiting after your treatment, we encourage you to take your nausea medication as directed/prescribed    If you develop nausea and vomiting that is not controlled by your nausea medication, call the clinic.   BELOW ARE SYMPTOMS THAT SHOULD BE REPORTED IMMEDIATELY:  *FEVER GREATER THAN 100.5 F  *CHILLS WITH OR WITHOUT FEVER  NAUSEA AND VOMITING THAT IS NOT CONTROLLED WITH YOUR NAUSEA MEDICATION  *UNUSUAL SHORTNESS OF BREATH  *UNUSUAL BRUISING OR BLEEDING  TENDERNESS IN MOUTH AND THROAT WITH OR WITHOUT PRESENCE OF ULCERS  *URINARY PROBLEMS  *BOWEL PROBLEMS  UNUSUAL RASH Items with * indicate a potential emergency and should be followed up as soon as possible.  Feel free to call the clinic you have any questions or concerns. The clinic phone number is [phone].

## 2015-03-05 ENCOUNTER — Ambulatory Visit
Admission: RE | Admit: 2015-03-05 | Discharge: 2015-03-05 | Disposition: A | Discharge status: Home / Self Care | Payer: 59 | Source: Ambulatory Visit | Attending: General Surgery | Admitting: General Surgery

## 2015-03-05 ENCOUNTER — Other Ambulatory Visit: Payer: Self-pay | Admitting: Nurse Practitioner

## 2015-03-05 DIAGNOSIS — C50412 Malignant neoplasm of upper-outer quadrant of left female breast: Secondary | ICD-10-CM

## 2015-03-06 ENCOUNTER — Encounter (HOSPITAL_BASED_OUTPATIENT_CLINIC_OR_DEPARTMENT_OTHER)
Admission: RE | Disposition: A | Discharge status: Home / Self Care | Payer: Self-pay | Source: Ambulatory Visit | Attending: General Surgery

## 2015-03-06 ENCOUNTER — Encounter (HOSPITAL_BASED_OUTPATIENT_CLINIC_OR_DEPARTMENT_OTHER): Payer: Self-pay

## 2015-03-06 ENCOUNTER — Ambulatory Visit (HOSPITAL_BASED_OUTPATIENT_CLINIC_OR_DEPARTMENT_OTHER): Payer: 59 | Admitting: Anesthesiology

## 2015-03-06 ENCOUNTER — Ambulatory Visit (HOSPITAL_BASED_OUTPATIENT_CLINIC_OR_DEPARTMENT_OTHER)
Admission: RE | Admit: 2015-03-06 | Discharge: 2015-03-06 | Disposition: A | Discharge status: Home / Self Care | Payer: 59 | Source: Ambulatory Visit | Attending: General Surgery | Admitting: General Surgery

## 2015-03-06 ENCOUNTER — Ambulatory Visit
Admission: RE | Admit: 2015-03-06 | Discharge: 2015-03-06 | Disposition: A | Discharge status: Home / Self Care | Payer: 59 | Source: Ambulatory Visit | Attending: General Surgery | Admitting: General Surgery

## 2015-03-06 ENCOUNTER — Ambulatory Visit (HOSPITAL_COMMUNITY)
Admission: RE | Admit: 2015-03-06 | Discharge: 2015-03-06 | Disposition: A | Discharge status: Home / Self Care | Payer: 59 | Source: Ambulatory Visit | Attending: General Surgery | Admitting: General Surgery

## 2015-03-06 DIAGNOSIS — Z9221 Personal history of antineoplastic chemotherapy: Secondary | ICD-10-CM | POA: Insufficient documentation

## 2015-03-06 DIAGNOSIS — Z170 Estrogen receptor positive status [ER+]: Secondary | ICD-10-CM | POA: Diagnosis not present

## 2015-03-06 DIAGNOSIS — R921 Mammographic calcification found on diagnostic imaging of breast: Secondary | ICD-10-CM | POA: Insufficient documentation

## 2015-03-06 DIAGNOSIS — C773 Secondary and unspecified malignant neoplasm of axilla and upper limb lymph nodes: Secondary | ICD-10-CM | POA: Insufficient documentation

## 2015-03-06 DIAGNOSIS — G629 Polyneuropathy, unspecified: Secondary | ICD-10-CM | POA: Insufficient documentation

## 2015-03-06 DIAGNOSIS — C50412 Malignant neoplasm of upper-outer quadrant of left female breast: Secondary | ICD-10-CM

## 2015-03-06 DIAGNOSIS — N6022 Fibroadenosis of left breast: Secondary | ICD-10-CM | POA: Insufficient documentation

## 2015-03-06 DIAGNOSIS — Z79899 Other long term (current) drug therapy: Secondary | ICD-10-CM | POA: Insufficient documentation

## 2015-03-06 DIAGNOSIS — N6012 Diffuse cystic mastopathy of left breast: Secondary | ICD-10-CM | POA: Insufficient documentation

## 2015-03-06 DIAGNOSIS — C50912 Malignant neoplasm of unspecified site of left female breast: Secondary | ICD-10-CM | POA: Diagnosis present

## 2015-03-06 HISTORY — DX: Personal history of antineoplastic chemotherapy: Z92.21

## 2015-03-06 HISTORY — PX: RADIOACTIVE SEED GUIDED PARTIAL MASTECTOMY WITH AXILLARY SENTINEL LYMPH NODE BIOPSY: SHX6520

## 2015-03-06 SURGERY — RADIOACTIVE SEED GUIDED PARTIAL MASTECTOMY WITH AXILLARY SENTINEL LYMPH NODE BIOPSY
Anesthesia: General | Site: Breast | Laterality: Left

## 2015-03-06 MED ORDER — ONDANSETRON HCL 4 MG/2ML IJ SOLN: 4.0000 mg | Freq: Once | INTRAMUSCULAR | Status: DC | PRN

## 2015-03-06 MED ORDER — PROPOFOL 10 MG/ML IV BOLUS
INTRAVENOUS | Status: DC | PRN
  Administered 2015-03-06: 200 mg via INTRAVENOUS

## 2015-03-06 MED ORDER — FENTANYL CITRATE 0.05 MG/ML IJ SOLN
INTRAMUSCULAR | Status: DC | PRN
  Administered 2015-03-06: 50 ug via INTRAVENOUS
  Administered 2015-03-06: 100 ug via INTRAVENOUS

## 2015-03-06 MED ORDER — FENTANYL CITRATE 0.05 MG/ML IJ SOLN
50.0000 ug | INTRAMUSCULAR | Status: DC | PRN
  Administered 2015-03-06 (×2): 50 ug via INTRAVENOUS

## 2015-03-06 MED ORDER — CEFAZOLIN SODIUM-DEXTROSE 2-3 GM-% IV SOLR
2.0000 g | INTRAVENOUS | Status: AC
  Administered 2015-03-06: 2 g via INTRAVENOUS

## 2015-03-06 MED ORDER — LACTATED RINGERS IV SOLN
INTRAVENOUS | Status: DC
  Administered 2015-03-06 (×2): via INTRAVENOUS

## 2015-03-06 MED ORDER — OXYCODONE HCL 5 MG PO TABS
5.0000 mg | ORAL_TABLET | Freq: Once | ORAL | Status: AC | PRN
  Administered 2015-03-06: 5 mg via ORAL

## 2015-03-06 MED ORDER — SODIUM CHLORIDE 0.9 % IJ SOLN
INTRAMUSCULAR | Status: AC
  Filled 2015-03-06: qty 10

## 2015-03-06 MED ORDER — MIDAZOLAM HCL 2 MG/ML PO SYRP
12.0000 mg | ORAL_SOLUTION | Freq: Once | ORAL | Status: DC | PRN
Start: 2015-03-06 — End: 2015-03-06

## 2015-03-06 MED ORDER — SODIUM CHLORIDE 0.9 % IJ SOLN
INTRAMUSCULAR | Status: DC | PRN
  Administered 2015-03-06: 08:00:00

## 2015-03-06 MED ORDER — MIDAZOLAM HCL 2 MG/2ML IJ SOLN
1.0000 mg | INTRAMUSCULAR | Status: DC | PRN
  Administered 2015-03-06 (×2): 1 mg via INTRAVENOUS

## 2015-03-06 MED ORDER — ONDANSETRON HCL 4 MG/2ML IJ SOLN
INTRAMUSCULAR | Status: DC | PRN
  Administered 2015-03-06: 4 mg via INTRAVENOUS

## 2015-03-06 MED ORDER — LIDOCAINE HCL (CARDIAC) 20 MG/ML IV SOLN
INTRAVENOUS | Status: DC | PRN
  Administered 2015-03-06: 50 mg via INTRAVENOUS

## 2015-03-06 MED ORDER — BUPIVACAINE HCL (PF) 0.25 % IJ SOLN
INTRAMUSCULAR | Status: AC
  Filled 2015-03-06: qty 30

## 2015-03-06 MED ORDER — TECHNETIUM TC 99M SULFUR COLLOID FILTERED
1.0000 | Freq: Once | INTRAVENOUS | Status: AC | PRN
Start: 2015-03-06 — End: 2015-03-06
  Administered 2015-03-06: 1 via INTRADERMAL

## 2015-03-06 MED ORDER — OXYCODONE HCL 5 MG PO TABS
ORAL_TABLET | ORAL | Status: AC
  Filled 2015-03-06: qty 1

## 2015-03-06 MED ORDER — DEXAMETHASONE SODIUM PHOSPHATE 4 MG/ML IJ SOLN
INTRAMUSCULAR | Status: DC | PRN
  Administered 2015-03-06: 10 mg via INTRAVENOUS

## 2015-03-06 MED ORDER — FENTANYL CITRATE 0.05 MG/ML IJ SOLN
INTRAMUSCULAR | Status: AC
  Filled 2015-03-06: qty 4

## 2015-03-06 MED ORDER — MIDAZOLAM HCL 2 MG/2ML IJ SOLN
INTRAMUSCULAR | Status: AC
  Filled 2015-03-06: qty 2

## 2015-03-06 MED ORDER — OXYCODONE-ACETAMINOPHEN 10-325 MG PO TABS: 1.0000 | ORAL_TABLET | Freq: Four times a day (QID) | ORAL | Status: DC | PRN

## 2015-03-06 MED ORDER — BUPIVACAINE-EPINEPHRINE (PF) 0.5% -1:200000 IJ SOLN
INTRAMUSCULAR | Status: DC | PRN
  Administered 2015-03-06: 30 mL via PERINEURAL

## 2015-03-06 MED ORDER — BUPIVACAINE HCL (PF) 0.25 % IJ SOLN
INTRAMUSCULAR | Status: DC | PRN
  Administered 2015-03-06: 8 mL

## 2015-03-06 MED ORDER — FENTANYL CITRATE 0.05 MG/ML IJ SOLN
INTRAMUSCULAR | Status: AC
  Filled 2015-03-06: qty 2

## 2015-03-06 MED ORDER — CEFAZOLIN SODIUM-DEXTROSE 2-3 GM-% IV SOLR
INTRAVENOUS | Status: AC
  Filled 2015-03-06: qty 50

## 2015-03-06 MED ORDER — METHYLENE BLUE 1 % INJ SOLN
INTRAMUSCULAR | Status: AC
  Filled 2015-03-06: qty 10

## 2015-03-06 MED ORDER — OXYCODONE HCL 5 MG/5ML PO SOLN
5.0000 mg | Freq: Once | ORAL | Status: AC | PRN
Start: 2015-03-06 — End: 2015-03-06

## 2015-03-06 MED ORDER — HYDROMORPHONE HCL 1 MG/ML IJ SOLN: 0.2500 mg | INTRAMUSCULAR | Status: DC | PRN

## 2015-03-06 SURGICAL SUPPLY — 63 items
APPLIER CLIP 9.375 MED OPEN (MISCELLANEOUS) ×3
BENZOIN TINCTURE PRP APPL 2/3 (GAUZE/BANDAGES/DRESSINGS) IMPLANT
BINDER BREAST LRG (GAUZE/BANDAGES/DRESSINGS) IMPLANT
BINDER BREAST MEDIUM (GAUZE/BANDAGES/DRESSINGS) ×3 IMPLANT
BINDER BREAST XLRG (GAUZE/BANDAGES/DRESSINGS) IMPLANT
BINDER BREAST XXLRG (GAUZE/BANDAGES/DRESSINGS) IMPLANT
BLADE SURG 15 STRL LF DISP TIS (BLADE) ×1 IMPLANT
BLADE SURG 15 STRL SS (BLADE) ×2
CANISTER SUC SOCK COL 7IN (MISCELLANEOUS) IMPLANT
CANISTER SUCT 1200ML W/VALVE (MISCELLANEOUS) IMPLANT
CHLORAPREP W/TINT 26ML (MISCELLANEOUS) ×3 IMPLANT
CLIP APPLIE 9.375 MED OPEN (MISCELLANEOUS) ×1 IMPLANT
CLOSURE WOUND 1/2 X4 (GAUZE/BANDAGES/DRESSINGS) ×1
COVER BACK TABLE 60X90IN (DRAPES) ×3 IMPLANT
COVER MAYO STAND STRL (DRAPES) ×3 IMPLANT
COVER PROBE W GEL 5X96 (DRAPES) ×3 IMPLANT
DECANTER SPIKE VIAL GLASS SM (MISCELLANEOUS) IMPLANT
DEVICE DUBIN W/COMP PLATE 8390 (MISCELLANEOUS) ×3 IMPLANT
DRAPE LAPAROSCOPIC ABDOMINAL (DRAPES) ×3 IMPLANT
DRAPE UTILITY XL STRL (DRAPES) ×3 IMPLANT
DRSG TEGADERM 4X4.75 (GAUZE/BANDAGES/DRESSINGS) IMPLANT
ELECT COATED BLADE 2.86 ST (ELECTRODE) ×3 IMPLANT
ELECT REM PT RETURN 9FT ADLT (ELECTROSURGICAL) ×3
ELECTRODE REM PT RTRN 9FT ADLT (ELECTROSURGICAL) ×1 IMPLANT
GLOVE BIO SURGEON STRL SZ7 (GLOVE) ×6 IMPLANT
GLOVE BIOGEL PI IND STRL 6.5 (GLOVE) ×2 IMPLANT
GLOVE BIOGEL PI IND STRL 7.5 (GLOVE) ×1 IMPLANT
GLOVE BIOGEL PI INDICATOR 6.5 (GLOVE) ×4
GLOVE BIOGEL PI INDICATOR 7.5 (GLOVE) ×2
GLOVE ECLIPSE 6.5 STRL STRAW (GLOVE) ×3 IMPLANT
GOWN STRL REUS W/ TWL LRG LVL3 (GOWN DISPOSABLE) ×2 IMPLANT
GOWN STRL REUS W/TWL LRG LVL3 (GOWN DISPOSABLE) ×4
KIT MARKER MARGIN INK (KITS) ×3 IMPLANT
LIQUID BAND (GAUZE/BANDAGES/DRESSINGS) ×3 IMPLANT
MARKER SKIN DUAL TIP RULER LAB (MISCELLANEOUS) ×3 IMPLANT
NDL SAFETY ECLIPSE 18X1.5 (NEEDLE) ×1 IMPLANT
NEEDLE HYPO 18GX1.5 SHARP (NEEDLE) ×2
NEEDLE HYPO 25X1 1.5 SAFETY (NEEDLE) ×6 IMPLANT
NS IRRIG 1000ML POUR BTL (IV SOLUTION) IMPLANT
PACK BASIN DAY SURGERY FS (CUSTOM PROCEDURE TRAY) ×3 IMPLANT
PENCIL BUTTON HOLSTER BLD 10FT (ELECTRODE) ×3 IMPLANT
SHEET MEDIUM DRAPE 40X70 STRL (DRAPES) IMPLANT
SLEEVE SCD COMPRESS KNEE MED (MISCELLANEOUS) ×3 IMPLANT
SPONGE GAUZE 4X4 12PLY STER LF (GAUZE/BANDAGES/DRESSINGS) IMPLANT
SPONGE LAP 4X18 X RAY DECT (DISPOSABLE) ×3 IMPLANT
STAPLER VISISTAT 35W (STAPLE) IMPLANT
STOCKINETTE IMPERVIOUS LG (DRAPES) IMPLANT
STRIP CLOSURE SKIN 1/2X4 (GAUZE/BANDAGES/DRESSINGS) ×2 IMPLANT
SUT ETHILON 2 0 FS 18 (SUTURE) IMPLANT
SUT MNCRL AB 4-0 PS2 18 (SUTURE) ×3 IMPLANT
SUT MON AB 5-0 PS2 18 (SUTURE) IMPLANT
SUT SILK 2 0 SH (SUTURE) ×6 IMPLANT
SUT VIC AB 2-0 SH 27 (SUTURE) ×2
SUT VIC AB 2-0 SH 27XBRD (SUTURE) ×1 IMPLANT
SUT VIC AB 3-0 SH 27 (SUTURE) ×4
SUT VIC AB 3-0 SH 27X BRD (SUTURE) ×2 IMPLANT
SUT VIC AB 5-0 PS2 18 (SUTURE) IMPLANT
SYR CONTROL 10ML LL (SYRINGE) ×6 IMPLANT
TOWEL OR 17X24 6PK STRL BLUE (TOWEL DISPOSABLE) ×3 IMPLANT
TOWEL OR NON WOVEN STRL DISP B (DISPOSABLE) ×3 IMPLANT
TUBE CONNECTING 20'X1/4 (TUBING)
TUBE CONNECTING 20X1/4 (TUBING) IMPLANT
YANKAUER SUCT BULB TIP NO VENT (SUCTIONS) IMPLANT

## 2015-03-06 NOTE — Anesthesia Preprocedure Evaluation (Addendum)
Anesthesia Evaluation  Patient identified by MRN, date of birth, ID band Patient awake    Reviewed: Allergy & Precautions, NPO status , Patient's Chart, lab work & pertinent test results  Airway Mallampati: I  TM Distance: >3 FB Neck ROM: Full    Dental  (+) Teeth Intact, Dental Advisory Given   Pulmonary  breath sounds clear to auscultation        Cardiovascular Rhythm:Regular Rate:Normal     Neuro/Psych    GI/Hepatic   Endo/Other    Renal/GU      Musculoskeletal   Abdominal   Peds  Hematology   Anesthesia Other Findings   Reproductive/Obstetrics                             Anesthesia Physical Anesthesia Plan  ASA: I  Anesthesia Plan: General   Post-op Pain Management:    Induction: Intravenous  Airway Management Planned: LMA  Additional Equipment:   Intra-op Plan:   Post-operative Plan: Extubation in OR  Informed Consent: I have reviewed the patients History and Physical, chart, labs and discussed the procedure including the risks, benefits and alternatives for the proposed anesthesia with the patient or authorized representative who has indicated his/her understanding and acceptance.   Dental advisory given  Plan Discussed with: CRNA and Surgeon  Anesthesia Plan Comments:        Anesthesia Quick Evaluation

## 2015-03-06 NOTE — H&P (Signed)
Nancy Austin is an 39 y.o. female.   Chief Complaint: breast cancer HPI:  24 yof who was seen in October 2015 with new left breast cancer.She has a mm with a 1.1x1 cm mass in the left breast. MRI shows a 1.7x1.7x1.6 cm mass with negative nodes. This underwent biopsy that shows an IDC grade II, er positive at 100%, pr pos at 89%, her 2 positive and Ki is 16%. She has undergone primary chemotherapy along with dual antiher2 therapy. She has some neuropathy but otherwise did great with chemotherapy. She has undergone mri at end of therapy that shows no residual enhancement, no new areas and nl nodes. She is here with her husband today. Her genetic testing was negative. She states this area disappeared after first cycle.   Past Medical History  Diagnosis Date  . Breast cancer of upper-outer quadrant of left female breast 09/24/2014  . History of chemotherapy     finished 02/10/2015    Past Surgical History  Procedure Laterality Date  . Tonsillectomy  1996  . Portacath placement N/A 10/15/2014    Procedure: INSERTION PORT-A-CATH;  Surgeon: Rolm Bookbinder, MD;  Location: [facility];  Service: General;  Laterality: N/A;    Family History  Problem Relation Age of Onset  . Skin cancer Father   . Prostate cancer Maternal Uncle 42    currently 56  . Multiple myeloma Paternal Grandmother 80    Deceased 54   Social History:  reports that she has never smoked. She has never used smokeless tobacco. She reports that she drinks alcohol. She reports that she does not use illicit drugs.  Allergies:  Allergies  Allergen Reactions  . Morphine And Related Nausea And Vomiting  . Tegaderm Ag Mesh [Silver] Rash    Medications Prior to Admission  Medication Sig Dispense Refill  . gabapentin (NEURONTIN) 300 MG capsule Take 1 capsule (300 mg total) by mouth at bedtime. 30 capsule 0  . Multiple Vitamin (MULTIVITAMIN) tablet Take 1 tablet by mouth daily.    Marland Kitchen gabapentin (NEURONTIN) 300 MG capsule TAKE  ONE CAPSULE BY MOUTH AT BEDTIME 30 capsule 1    Results for orders placed or performed in visit on 03/04/15 (from the past 48 hour(s))  CBC with Differential     Status: Abnormal   Collection Time: 03/04/15  8:36 AM  Result Value Ref Range   WBC 2.4 (L) 3.9 - 10.3 10e3/uL   NEUT# 1.0 (L) 1.5 - 6.5 10e3/uL   HGB 12.2 11.6 - 15.9 g/dL   HCT 37.3 34.8 - 46.6 %   Platelets 140 (L) 145 - 400 10e3/uL   MCV 94.7 79.5 - 101.0 fL   MCH 31.0 25.1 - 34.0 pg   MCHC 32.7 31.5 - 36.0 g/dL   RBC 3.94 3.70 - 5.45 10e6/uL   RDW 13.2 11.2 - 14.5 %   lymph# 1.2 0.9 - 3.3 10e3/uL   MONO# 0.2 0.1 - 0.9 10e3/uL   Eosinophils Absolute 0.0 0.0 - 0.5 10e3/uL   Basophils Absolute 0.0 0.0 - 0.1 10e3/uL   NEUT% 42.1 38.4 - 76.8 %   LYMPH% 50.0 (H) 14.0 - 49.7 %   MONO% 6.7 0.0 - 14.0 %   EOS% 0.8 0.0 - 7.0 %   BASO% 0.4 0.0 - 2.0 %  Comprehensive metabolic panel     Status: Abnormal   Collection Time: 03/04/15  8:36 AM  Result Value Ref Range   Sodium 142 136 - 145 mEq/L   Potassium 4.1 3.5 -  5.1 mEq/L   Chloride 107 98 - 109 mEq/L   CO2 26 22 - 29 mEq/L   Glucose 93 70 - 140 mg/dl   BUN 6.7 (L) 7.0 - 26.0 mg/dL   Creatinine 0.7 0.6 - 1.1 mg/dL   Total Bilirubin 0.48 0.20 - 1.20 mg/dL   Alkaline Phosphatase 65 40 - 150 U/L   AST 24 5 - 34 U/L   ALT 24 0 - 55 U/L   Total Protein 6.4 6.4 - 8.3 g/dL   Albumin 3.8 3.5 - 5.0 g/dL   Calcium 8.9 8.4 - 10.4 mg/dL   Anion Gap 10 3 - 11 mEq/L   EGFR >90 >90 ml/min/1.73 m2    Comment: eGFR is calculated using the CKD-EPI Creatinine Equation (2009)   Mm Lt Radioactive Seed Loc Mammo Guide  03/05/2015   CLINICAL DATA:  Preoperative radioactive seed localization at the site of biopsy proven breast cancer left breast 3 o'clock location marked with a ribbon shaped clip  EXAM: MAMMOGRAPHIC GUIDED RADIOACTIVE SEED LOCALIZATION OF THE left BREAST  COMPARISON:  Previous exam(s).  FINDINGS: Patient presents for radioactive seed localization prior to lumpectomy. I met  with the patient and we discussed the procedure of seed localization including benefits and alternatives. We discussed the high likelihood of a successful procedure. We discussed the risks of the procedure including infection, bleeding, tissue injury and further surgery. We discussed the low dose of radioactivity involved in the procedure. Informed, written consent was given.  The usual time-out protocol was performed immediately prior to the procedure.  Using mammographic guidance, sterile technique, 2% lidocaine and an I-125 radioactive seed, the ribbon shaped clip was localized using a lateral to medial approach. The follow-up mammogram images confirm the seed in the expected location and were marked for Dr. Donne Hazel.  Follow-up survey of the patient confirms presence of the radioactive seed.  Order number of I-125 seed:  [id number].  Total activity:  0.250 mCi  Reference Date: 02/12/2015  The patient tolerated the procedure well and was released from the [place]. She was given instructions regarding seed removal.  IMPRESSION: Radioactive seed localization left breast. No apparent complications.   Electronically Signed   By: Conchita Paris M.D.   On: 03/05/2015 10:20    ROS Negative  Blood pressure 107/66, pulse 59, temperature 98 F (36.7 C), temperature source Oral, resp. rate 20, height _0  (1.676 m), weight 65.318 kg (144 lb), last menstrual period 02/22/2015, SpO2 99 %. Physical Exam  Vitals (Alisha Spillers MA; 02/17/2015 4:28 PM) 02/17/2015 4:28 PM Weight: 145 lb Height: 66in Body Surface Area: 1.75 m Body Mass Index: 23.4 kg/m Pulse: 72 (Regular)  BP: 126/74 (Sitting, Left Arm, Standard)  Physical Exam Rolm Bookbinder MD; 02/17/2015 5:07 PM) General Mental Status-Alert. Orientation-Oriented X3. Head and Neck Note: 2 x 2 cm subq lipoma left forehead Chest and Lung Exam Chest and lung exam reveals -on auscultation, normal breath sounds, no adventitious sounds  and normal vocal resonance. Breast Nipples-No Discharge. Breast Lump-No Palpable Breast Mass. Note: right chest with port in place Cardiovascular Cardiovascular examination reveals -normal heart sounds, regular rate and rhythm with no murmurs. Lymphatic Head & Neck General Head & Neck Lymphatics: Bilateral - Description - Normal. Axillary General Axillary Region: Bilateral - Description - Normal. Note: no [place] adenopathy  Assessment/Plan CARCINOMA OF UPPER-OUTER QUADRANT OF FEMALE BREAST, LEFT (174.4  C50.412)  Left breast radioactive seed guided lumpectomy, left axillary sentinel node biopsy  We discussed her great mr response to  treatment. then we discussed surgical options. She should have a sentinel node biopsy. We discussed a sentinel lymph node biopsy as she does not appear to having lymph node involvement right now. We discussed the performance of that with injection of radioactive tracer and blue dye. We discussed that she would have an incision underneath her axillary hairline. We discussed about a 1-2% risk lifetime of chronic shoulder pain as well as lymphedema associated with a sentinel lymph node biopsy. We discussed the options for treatment of the breast cancer which included lumpectomy versus a mastectomy. We discussed the performance of the lumpectomy with seed placement. We discussed a 5% chance of a positive margin requiring reexcision in the operating room. We also discussed that she will need radiation therapy if she undergoes lumpectomy. We discussed mastectomy as well. We discussed that there is no difference in her survival whether she undergoes lumpectomy with radiation therapy or antiestrogen therapy versus a mastectomy. There is also no advantage to bilateral mastectomy with negative genetics. We discussed she may even have a pathologic complete response. We discussed the risks of operation including bleeding, infection, possible  reoperation.  Daltin Crist 03/06/2015, 7:06 AM

## 2015-03-06 NOTE — Progress Notes (Signed)
Assisted Dr. Ossey with left, ultrasound guided, pectoralis block. Side rails up, monitors on throughout procedure. See vital signs in flow sheet. Tolerated Procedure well. 

## 2015-03-06 NOTE — Transfer of Care (Signed)
Immediate Anesthesia Transfer of Care Note  Patient: Nancy Austin  Procedure(s) Performed: Procedure(s): RADIOACTIVE SEED GUIDED PARTIAL MASTECTOMY WITH AXILLARY SENTINEL LYMPH NODE BIOPSY (Left)  Patient Location: PACU  Anesthesia Type:General and GA combined with regional for post-op pain  Level of Consciousness: awake  Airway & Oxygen Therapy: Patient Spontanous Breathing and Patient connected to face mask oxygen  Post-op Assessment: Report given to RN and Post -op Vital signs reviewed and stable  Post vital signs: Reviewed and stable  Last Vitals:  Filed Vitals:   03/06/15 0725  BP:   Pulse: 76  Temp:   Resp: 14    Complications: No apparent anesthesia complications

## 2015-03-06 NOTE — Discharge Instructions (Signed)
[place] Office Phone Number [phone]   POST OP INSTRUCTIONS  Always review your discharge instruction sheet given to you by the facility where your surgery was performed.  IF YOU HAVE DISABILITY OR FAMILY LEAVE FORMS, YOU MUST BRING THEM TO THE OFFICE FOR PROCESSING.  DO NOT GIVE THEM TO YOUR DOCTOR.  1. A prescription for pain medication may be given to you upon discharge.  Take your pain medication as prescribed, if needed.  If narcotic pain medicine is not needed, then you may take acetaminophen (Tylenol), naprosyn (Alleve) or ibuprofen (Advil) as needed. I would take ibuprofen 400 mg three times daily for the next five days then take as needed. Use percocet as needed in addition to that 2. Take your usually prescribed medications unless otherwise directed 3. If you need a refill on your pain medication, please contact your pharmacy.  They will contact our office to request authorization.  Prescriptions will not be filled after 5pm or on week-ends. 4. You should eat very light the first 24 hours after surgery, such as soup, crackers, pudding, etc.  Resume your normal diet the day after surgery. 5. Most patients will experience some swelling and bruising in the breast.  Ice packs and a good support bra will help.  Wear the breast binder provided  for 72 hours day and night.  After that wear a sports bra during the day until you return to the office. Swelling and bruising can take several days to resolve.  6. It is common to experience some constipation if taking pain medication after surgery.  Increasing fluid intake and taking a stool softener will usually help or prevent this problem from occurring.  A mild laxative (Milk of Magnesia or Miralax) should be taken according to package directions if there are no bowel movements after 48 hours. 7. Unless discharge instructions indicate otherwise, you may remove your bandages 48 hours after surgery and you may shower at that  time.  You may have steri-strips (small skin tapes) in place directly over the incision.  These strips should be left on the skin for 7-10 days and will come off on their own.  If your surgeon used skin glue on the incision, you may shower in 24 hours.  The glue will flake off over the next 2-3 weeks.  Any sutures or staples will be removed at the office during your follow-up visit. 8. ACTIVITIES:  You may resume regular daily activities (gradually increasing) beginning the next day.  Wearing a good support bra or sports bra minimizes pain and swelling.  You may have sexual intercourse when it is comfortable. a. You may drive when you no longer are taking prescription pain medication, you can comfortably wear a seatbelt, and you can safely maneuver your car and apply brakes. b. RETURN TO WORK:  ______________________________________________________________________________________ 9. You should see your doctor in the office for a follow-up appointment approximately two weeks after your surgery.  Your doctors nurse will typically make your follow-up appointment when she calls you with your pathology report.  Expect your pathology report 3-4 business days after your surgery.  You may call to check if you do not hear from [place] after three days. 10. OTHER INSTRUCTIONS: _______________________________________________________________________________________________ _____________________________________________________________________________________________________________________________________ _____________________________________________________________________________________________________________________________________ _____________________________________________________________________________________________________________________________________  WHEN TO CALL DR WAKEFIELD: 1. Fever over 101.0 2. Nausea and/or vomiting. 3. Extreme swelling or bruising. 4. Continued bleeding from  incision. 5. Increased pain, redness, or drainage from the incision.  The clinic staff is available to answer your questions  during regular business hours.  Please dont hesitate to call and ask to speak to one of the nurses for clinical concerns.  If you have a medical emergency, go to the nearest emergency room or call 911.  A surgeon from [facility] is always on call at the hospital.  For further questions, please visit [email or web address]    Post Anesthesia Home Care Instructions  Activity: Get plenty of rest for the remainder of the day. A responsible adult should stay with you for 24 hours following the procedure.  For the next 24 hours, DO NOT: -Drive a car -Paediatric nurse -Drink alcoholic beverages -Take any medication unless instructed by your physician -Make any legal decisions or sign important papers.  Meals: Start with liquid foods such as gelatin or soup. Progress to regular foods as tolerated. Avoid greasy, spicy, heavy foods. If nausea and/or vomiting occur, drink only clear liquids until the nausea and/or vomiting subsides. Call your physician if vomiting continues.  Special Instructions/Symptoms: Your throat may feel dry or sore from the anesthesia or the breathing tube placed in your throat during surgery. If this causes discomfort, gargle with warm salt water. The discomfort should disappear within 24 hours.

## 2015-03-06 NOTE — Op Note (Signed)
Preoperative diagnosis: Clinical stage I her 2 positive left breast cancer status post primary chemotherapy Postoperative diagnosis: Same as above Procedure: #1 left breast radioactive seed guided lumpectomy #2 left axillary sentinel lymph node biopsy #3 injection of blue dye for sentinel node identification Surgeon: Dr. Serita Grammes Anesthesia: Gen. With pectoral block Estimated blood loss: Minimal Specimens: #1 left breast tissue marked with paint #2 additional medial margin marked short superior, long stitch is medial, double stitch deep #3 additional inferior margin marked short superior, long lateral, double deep #4 sentinel lymph node 4 with counts ranging from [id number] Complications: None Drains: None Sponge and needle count was correct at completion Disposition to recovery stable  Indications: This is a 39 year old female who was diagnosed with a HER-2/neu positive left breast cancer. She has undergone primary chemotherapy along with anti-HER-2/neu therapy. She had a good clinical response and has had a complete radiologic response. We discussed all of her options and decided proceed with a lumpectomy and sentinel node biopsy. She had the radioactive seeds placed prior to beginning.  Procedure: I had her mammograms available in the operating room. After informed consent was obtained she underwent a pectoral block and injection of technetium in the standard periareolar fashion. She was then given cefazolin. Sequential compression devices were on her legs. She was then placed under general anesthesia without complication. Her left breast was prepped and draped in the standard sterile surgical fashion. A surgical timeout was then performed.  I then infiltrated a mixture of methylene blue dye and saline in a periareolar injection. I massaged this. I then located the seed in the lateral left breast. I ended up making incision more in the inframammary crease and lateral portion of the  breast. I used the neoprobe to then guide me to the seed. I excised the seed and the surrounding tissue with an attempt to get a clear margin. This was all the way down to the pectoralis muscle including the fascia. The seed was present in my specimen. There was no more radioactivity in the breast. I confirmed this with the mammogram on the Faxitron. This was confirmed by radiology. I thought my margins with calcifications were close so I did remove an additional medial and inferior margin. I placed clips around my cavity. I then obtained hemostasis. I then closed the breast tissue with 2-0 Vicryl. I then closed the skin with 3-0 Vicryl and 4-0 Monocryl. Following that I place glue and Steri-Strips.  I then located the sentinel node. I made an axillary incision. I identified 4 sentinel lymph nodes. One of these may have more than one lymph node in it. The others were hot and blue. There was no background radioactivity and no more bleeding I upon completion. The counts are listed as above. There were no palpable nodes. I then obtained hemostasis. I closed this with 2-0 Vicryl, 3-0 Vicryl, and 4-0 Monocryl. A breast binder was placed. She tolerated this well was extubated and transferred to the recovery room in stable condition.

## 2015-03-06 NOTE — Anesthesia Procedure Notes (Addendum)
Anesthesia Regional Block:  Pectoralis block  Pre-Anesthetic Checklist: ,, timeout performed, Correct Patient, Correct Site, Correct Laterality, Correct Procedure, Correct Position, site marked, Risks and benefits discussed,  Surgical consent,  Pre-op evaluation,  At surgeon's request and post-op pain management  Laterality: Left  Prep: chloraprep       Needles:  Injection technique: Single-shot     Needle Length: 9cm 9 cm Needle Gauge: 21 and 21 G    Additional Needles:  Procedures: ultrasound guided (picture in chart) Pectoralis block Narrative:  Start time: 03/06/2015 7:10 AM End time: 03/06/2015 7:20 AM Injection made incrementally with aspirations every 5 mL.  Performed by: Personally  Anesthesiologist: Lillia Abed  Additional Notes: Monitors applied. Patient sedated. Sterile prep and drape,hand hygiene and sterile gloves were used. Relevant anatomy identified.Needle position confirmed.Local anesthetic injected incrementally after negative aspiration. Local anesthetic spread visualized. Vascular puncture avoided. No complications. Image printed for medical record.The patient tolerated the procedure well.       Procedure Name: LMA Insertion Date/Time: 03/06/2015 7:35 AM Performed by: Lieutenant Diego Pre-anesthesia Checklist: Patient identified, Emergency Drugs available, Suction available and Patient being monitored Patient Re-evaluated:Patient Re-evaluated prior to inductionOxygen Delivery Method: Circle System Utilized Preoxygenation: Pre-oxygenation with 100% oxygen Intubation Type: IV induction Ventilation: Mask ventilation without difficulty LMA: LMA inserted LMA Size: 4.0 Number of attempts: 1 Airway Equipment and Method: Bite block Placement Confirmation: positive ETCO2 and breath sounds checked- equal and bilateral Tube secured with: Tape Dental Injury: Teeth and Oropharynx as per pre-operative assessment

## 2015-03-06 NOTE — Anesthesia Postprocedure Evaluation (Signed)
Anesthesia Post Note  Patient: Nancy Austin  Procedure(s) Performed: Procedure(s) (LRB): RADIOACTIVE SEED GUIDED PARTIAL MASTECTOMY WITH AXILLARY SENTINEL LYMPH NODE BIOPSY (Left)  Anesthesia type: general  Patient location: PACU  Post pain: Pain level controlled  Post assessment: Patient's Cardiovascular Status Stable  Last Vitals:  Filed Vitals:   03/06/15 0945  BP: 117/71  Pulse: 62  Temp:   Resp: 15    Post vital signs: Reviewed and stable  Level of consciousness: sedated  Complications: No apparent anesthesia complications

## 2015-03-11 ENCOUNTER — Encounter (HOSPITAL_BASED_OUTPATIENT_CLINIC_OR_DEPARTMENT_OTHER): Payer: Self-pay | Admitting: General Surgery

## 2015-03-19 ENCOUNTER — Ambulatory Visit: Payer: 59 | Attending: General Surgery | Admitting: Physical Therapy

## 2015-03-19 DIAGNOSIS — M25612 Stiffness of left shoulder, not elsewhere classified: Secondary | ICD-10-CM | POA: Insufficient documentation

## 2015-03-19 DIAGNOSIS — M25512 Pain in left shoulder: Secondary | ICD-10-CM | POA: Insufficient documentation

## 2015-03-19 NOTE — Patient Instructions (Signed)
Cane Overhead - Supine  Hold cane at thighs with both hands, extend arms straight over head. Hold __2_ seconds. Repeat _10__ times. Do _3__ times per day.  BREATHE    Same position, take the cane out to the side.

## 2015-03-19 NOTE — Therapy (Signed)
[place]ancy [place], [place], [address] Phone: [phone]   Fax:  [phone]  Physical Therapy Evaluation  Patient Details  Name: Nancy Austin MRN: [id number] Date of Birth: [birth year] Referring Provider:  Rolm Bookbinder, MD  Encounter Date: 03/19/2015      PT End of Session - 03/19/15 1213    Visit Number 1   Number of Visits 12   Date for PT Re-Evaluation 04/18/15   PT Start Time 1114   PT Stop Time 1145   PT Time Calculation (min) 31 min   Activity Tolerance Patient tolerated treatment well   Behavior During Therapy [facility] for tasks assessed/performed      Past Medical History  Diagnosis Date  . Breast cancer of upper-outer quadrant of left female breast 09/24/2014  . History of chemotherapy     finished 02/10/2015    Past Surgical History  Procedure Laterality Date  . Tonsillectomy  1996  . Portacath placement N/A 10/15/2014    Procedure: INSERTION PORT-A-CATH;  Surgeon: Rolm Bookbinder, MD;  Location: [facility];  Service: General;  Laterality: N/A;  . Radioactive seed guided mastectomy with axillary sentinel lymph node biopsy Left 03/06/2015    Procedure: RADIOACTIVE SEED GUIDED PARTIAL MASTECTOMY WITH AXILLARY SENTINEL LYMPH NODE BIOPSY;  Surgeon: Rolm Bookbinder, MD;  Location: [place];  Service: General;  Laterality: Left;    There were no vitals filed for this visit.  Visit Diagnosis:  Shoulder stiffness, left  Left shoulder pain      Subjective Assessment - 03/19/15 1227    Subjective Pt reports she has pulling down into her elbow whenever she tries to use her arm   Pertinent History breast cancer ,with chemo and lumpectomy with sentinal node (4 nodes removed) biopsy March 24. no drains in. plans to have radiation    Patient Stated Goals to improve range of motion so she can have radiadion    Currently in Pain? Yes   Pain Location Arm   Pain Orientation Left   Pain Descriptors / Indicators Tightness   Pain Type Acute pain   Pain Radiating Towards elbow   Aggravating Factors  raising her left arm            [facility] PT Assessment - 03/19/15 0001    Assessment   Medical Diagnosis breast cancer    Onset Date 09/12/14   Precautions   Precautions Other (comment)  previous chemo   Restrictions   Weight Bearing Restrictions No   Balance Screen   Has the patient fallen in the past 6 months No   Has the patient had a decrease in activity level because of a fear of falling?  No   Is the patient reluctant to leave their home because of a fear of falling?  No   Home Environment   Living Enviornment Private residence   Living Arrangements Spouse/significant other;Children  11 and 59 yo   Prior Function   Level of Independence Independent with basic ADLs;Independent with homemaking with ambulation;Independent with gait;Independent with transfers   [place] Unemployed   Leisure walk, used to run, outdoor activities    Cognition   Overall Cognitive Status Within Functional Limits for tasks assessed   Observation/Other Assessments   Observations thin female with intact steristrips in left axilla and left lateral breast. She comes in wearing a headscarff   Skin Integrity healing incisions with some visible puffiness in left medial upper arm   Sensation   Light Touch  Appears Intact   Coordination   Gross Motor Movements are Fluid and Coordinated Yes   Posture/Postural Control   Posture/Postural Control No significant limitations   AROM   Right Shoulder Extension 60 Degrees   Right Shoulder Flexion 170 Degrees   Right Shoulder ABduction 173 Degrees   Right Shoulder Internal Rotation 65 Degrees   Right Shoulder External Rotation 90 Degrees   Left Shoulder Extension 60 Degrees   Left Shoulder Flexion 95 Degrees  pulling down toward elbow   Left Shoulder ABduction 74 Degrees  painful    Left Shoulder Internal Rotation 65 Degrees   Left Shoulder  External Rotation 90 Degrees   Strength   Right Shoulder Flexion 5/5   Right Shoulder ABduction 5/5   Left Shoulder Flexion 3-/5   Left Shoulder ABduction 3-/5   Palpation   Palpation tender edema at anterior shoulder arm upper arm           LYMPHEDEMA/ONCOLOGY QUESTIONNAIRE - 03/19/15 1131    Right Upper Extremity Lymphedema   10 cm Proximal to Olecranon Process 25.7 cm   Olecranon Process 24 cm   10 cm Proximal to Ulnar Styloid Process 21.5 cm   Just Proximal to Ulnar Styloid Process 15.5 cm   Across Hand at [employer] 19.5 cm   At [place] of 2nd Digit 6 cm   Left Upper Extremity Lymphedema   10 cm Proximal to Olecranon Process 26.7 cm   Olecranon Process 24 cm   10 cm Proximal to Ulnar Styloid Process 20.4 cm   Just Proximal to Ulnar Styloid Process 15.6 cm   Across Hand at [employer] 18.5 cm   At [place] of 2nd Digit 5.8 cm           Quick Dash - 03/19/15 0001    Open a tight or new jar Mild difficulty   Do heavy household chores (wash walls, wash floors) Moderate difficulty   Carry a shopping bag or briefcase Moderate difficulty   Wash your back No difficulty   Use a knife to cut food Severe difficulty   Recreational activities in which you take some force or impact through your arm, shoulder, or hand (golf, hammering, tennis) Mild difficulty   During the past week, to what extent has your arm, shoulder or hand problem interfered with your normal social activities with family, friends, neighbors, or groups? Modererately   During the past week, to what extent has your arm, shoulder or hand problem limited your work or other regular daily activities Modererately   Arm, shoulder, or hand pain. Moderate   Tingling (pins and needles) in your arm, shoulder, or hand Moderate   Difficulty Sleeping Moderate difficulty   DASH Score 43.18 %             [facility] Adult PT Treatment/Exercise - 03/19/15 0001    Shoulder Exercises: Supine   Flexion AAROM;5 reps  with  dowel   ABduction AAROM;5 reps  with dowel   Other Supine Exercises hand behind head with elbow supported                 PT Education - 03/19/15 1153    Education provided Yes   Education Details supine cane exercise, left arm in elevation and external rotation with head rotated to right to prepare for radiaion    Person(s) Educated Patient   Methods Explanation;Demonstration;Verbal cues;Handout   Comprehension Verbalized understanding;Returned demonstration;Verbal cues required;Need further instruction  Short Term Clinic Goals - 03/19/15 1220    CC Short Term Goal  #1   Title short term goals= long term goals             Long Term Clinic Goals - 03/19/15 1220    CC Long Term Goal  #1   Title pt will verbalize knowledge of lymphedem risk reduction pratices.   Time 4   Period Weeks   Status New   CC Long Term Goal  #2   Title pt will have left shoulder abduction of  120 degrees so that she can receive radiation treatmet   Baseline 74   Time 4   Period Weeks   Status New   CC Long Term Goal  #3   Title pt will report a decrease in pain by 50% so she can perform daily tasks with greater ease    Time 4   Period Weeks   Status New   CC Long Term Goal  #4   Title pt will decrease DASH score to 20 demonstrating functional improvement of arm    Baseline 43.18   Time 4   Period Weeks   Status New            Plan - 03/19/15 1214    Clinical Impression Statement 39 yo female 2 weeks post lumpectomy with 4 nodes removed who has painful limited left shoulder range of moiton that radiates toward elbow with upper arm swelling She was provided with tg soft for comfort and instructed in beginning active assisted range of motion and positional stretch   Pt will benefit from skilled therapeutic intervention in order to improve on the following deficits Decreased strength;Pain;Decreased knowledge of use of DME;Increased edema;Decreased range of motion    Rehab Potential Excellent   Clinical Impairments Affecting Rehab Potential previous chemotherapy   PT Frequency 3x / week   PT Duration 4 weeks  likely decrease to 2x/week  once radiaton starts    PT Treatment/Interventions Therapeutic exercise;Passive range of motion;Patient/family education;Manual techniques;Manual lymph drainage;Therapeutic activities   PT Next Visit Plan assess effectiveness of Tg soft.  Active , Active assisted and passive range of motion, advance home exercise to wall stretches, manual lymph drainage to upper arm if needed to assist with post op edema give info about ABC class   Recommended Other Services ABC class    Consulted and Agree with Plan of Care Patient         Problem List Patient Active Problem List   Diagnosis Date Noted  . Chemotherapy induced neutropenia 02/03/2015  . Bronchitis 01/23/2015  . 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 Constipation 12/09/2014  . Hot flashes 12/09/2014  . Spotting 11/20/2014  . Vasovagal near syncope 11/16/2014  . Breast cancer of upper-outer quadrant of left female breast 09/24/2014   Donato Heinz. Owens Shark, PT 03/19/2015, 12:28 PM  [place], [place], [address] Phone: [phone]   Fax:  [phone]

## 2015-03-20 ENCOUNTER — Ambulatory Visit: Payer: 59 | Admitting: Physical Therapy

## 2015-03-20 DIAGNOSIS — M25612 Stiffness of left shoulder, not elsewhere classified: Secondary | ICD-10-CM | POA: Diagnosis not present

## 2015-03-20 DIAGNOSIS — M25512 Pain in left shoulder: Secondary | ICD-10-CM

## 2015-03-20 NOTE — Therapy (Signed)
[place], [place], [address] Phone: [phone]   Fax:  [phone]  Physical Therapy Treatment  Patient Details  Name: Nancy Austin MRN: [id number] Date of Birth: 1976/01/25 Referring Provider:  Paula Compton, MD  Encounter Date: 03/20/2015      PT End of Session - 03/20/15 1103    Visit Number 2   Number of Visits 12   Date for PT Re-Evaluation 04/18/15   PT Start Time 8341   PT Stop Time 1101   PT Time Calculation (min) 46 min   Activity Tolerance Patient limited by pain;Patient tolerated treatment well   Behavior During Therapy [facility] for tasks assessed/performed      Past Medical History  Diagnosis Date  . Breast cancer of upper-outer quadrant of left female breast 09/24/2014  . History of chemotherapy     finished 02/10/2015    Past Surgical History  Procedure Laterality Date  . Tonsillectomy  1996  . Portacath placement N/A 10/15/2014    Procedure: INSERTION PORT-A-CATH;  Surgeon: Rolm Bookbinder, MD;  Location: [facility];  Service: General;  Laterality: N/A;  . Radioactive seed guided mastectomy with axillary sentinel lymph node biopsy Left 03/06/2015    Procedure: RADIOACTIVE SEED GUIDED PARTIAL MASTECTOMY WITH AXILLARY SENTINEL LYMPH NODE BIOPSY;  Surgeon: Rolm Bookbinder, MD;  Location: [place];  Service: General;  Laterality: Left;    There were no vitals filed for this visit.  Visit Diagnosis:  Shoulder stiffness, left  Left shoulder pain      Subjective Assessment - 03/20/15 1019    Subjective nothing new since yesterday   Currently in Pain? Yes   Pain Score 2    Pain Location Arm   Pain Orientation Left   Pain Descriptors / Indicators Discomfort   Aggravating Factors  moving left arm   Pain Relieving Factors resting arm               LYMPHEDEMA/ONCOLOGY QUESTIONNAIRE - 03/19/15 1131    Right Upper Extremity Lymphedema   10 cm Proximal  to Olecranon Process 25.7 cm   Olecranon Process 24 cm   10 cm Proximal to Ulnar Styloid Process 21.5 cm   Just Proximal to Ulnar Styloid Process 15.5 cm   Across Hand at [employer] 19.5 cm   At [place] of 2nd Digit 6 cm   Left Upper Extremity Lymphedema   10 cm Proximal to Olecranon Process 26.7 cm   Olecranon Process 24 cm   10 cm Proximal to Ulnar Styloid Process 20.4 cm   Just Proximal to Ulnar Styloid Process 15.6 cm   Across Hand at [employer] 18.5 cm   At [place] of 2nd Digit 5.8 cm                [facility] Adult PT Treatment/Exercise - 03/20/15 0001    Exercises   Exercises --   Shoulder Exercises: Supine   Other Supine Exercises dowel exercises for shoulder flexion, horizontal abduction x 10 each (left side)   Shoulder Exercises: Standing   Flexion AAROM;Left;10 reps  with hand in pillowcase sliding up wall   ABduction AAROM;Left;10 reps  with hand in pillowcase sliding up wall   Shoulder Exercises: Stretch   Other Shoulder Stretches     Manual Therapy   Manual Therapy Passive ROM;Myofascial release   Myofascial Release left UE myofascial pulling in supine and in right sidelying; left scapular mobilization with emphasis on protraction and depression  Passive ROM Left shoulder stretch to tolerance in abduction and flexion; left UE neural tension stretches; gentle massage (stroking) of cording at left axilla while left arm is in abduction (and palpable pop felt, without pain to patient)       TG soft light compression garment was placed on patient's left arm at end of session.         PT Education - 03/20/15 1041    Education provided Yes   Education Details Gave ABC class instructions, sliding arm up wall for flexion and abduction   Person(s) Educated Patient   Methods Explanation;Demonstration;Handout   Comprehension Verbalized understanding;Returned demonstration           Short Term Clinic Goals - 03/19/15 1220    CC Short Term Goal  #1    Title short term goals= long term goals             Long Term Clinic Goals - 03/19/15 1220    CC Long Term Goal  #1   Title pt will verbalize knowledge of lymphedem risk reduction pratices.   Time 4   Period Weeks   Status New   CC Long Term Goal  #2   Title pt will have left shoulder abduction of  120 degrees so that she can receive radiation treatmet   Baseline 74   Time 4   Period Weeks   Status New   CC Long Term Goal  #3   Title pt will report a decrease in pain by 50% so she can perform daily tasks with greater ease    Time 4   Period Weeks   Status New   CC Long Term Goal  #4   Title pt will decrease DASH score to 20 demonstrating functional improvement of arm    Baseline 43.18   Time 4   Period Weeks   Status New            Plan - 03/20/15 1104    Clinical Impression Statement Patient with palpable cording in left axilla and visible cording down to forearm with certain positions; left upper back muscles are also very tight.  Mild, non-painful popping felt with gentle stroking of cording inleft axilla with left arm in approx. 90 degrees abduction today.   Pt will benefit from skilled therapeutic intervention in order to improve on the following deficits Decreased strength;Pain;Decreased knowledge of use of DME;Increased edema;Decreased range of motion   PT Treatment/Interventions Therapeutic exercise;Passive range of motion;Manual techniques   PT Next Visit Plan Continue manual techniques for ROM and release of cording, neural tension.  Manual lymph drainage prn.  Progress HEP   PT Home Exercise Plan see education section   Recommended Other Services Pt. was given ABC class info today   Consulted and Agree with Plan of Care Patient        Problem List Patient Active Problem List   Diagnosis Date Noted  . Chemotherapy induced neutropenia 02/03/2015  . Bronchitis 01/23/2015  . 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 Constipation 12/09/2014   . Hot flashes 12/09/2014  . Spotting 11/20/2014  . Vasovagal near syncope 11/16/2014  . Breast cancer of upper-outer quadrant of left female breast 09/24/2014    [place],DONNA 03/20/2015, 12:14 PM  [address], [place], [address] Phone: [phone]   Fax:  Bernie, PT 03/20/2015 12:14 PM

## 2015-03-20 NOTE — Patient Instructions (Signed)
Flexors Stretch, Standing   Put your hand in a pillow case. Stand near wall and slide arm up, with palm facing away from wall, by leaning toward wall. Hold _5__ seconds.  Repeat 5-10 times per session. Do _2__ sessions per day.  Also do this with your left shoulder toward the wall.  Place your pinkie finger against the wall and slide up.  Same instructions as above.  Copyright  VHI. All rights reserved.

## 2015-03-21 NOTE — Progress Notes (Signed)
Location of Breast Cancer: Clinical stage I her 2 positive left upper outer quadrant breast cancer :   Histology per Pathology Report:  03/06/15 Diagnosis 1. Breast, lumpectomy, Left - INVASIVE DUCTAL CARCINOMA, GRADE I/III, SPANNING 1.7 CM. - DUCTAL CARCINOMA IN SITU, LOW GRADE. - LYMPHOVASCULAR INVASION IS IDENTIFIED. - INVASIVE CARCINOMA IS BROADLY PRESENT AT THE MEDIAL MARGIN OF SPECIMEN #1. - SEE ONCOLOGY TABLE BELOW. 2. Lymph node, sentinel, biopsy, Left axillary - METASTATIC CARCINOMA IN 1 OF 1 LYMPH NODE (1/1). 3. Lymph node, sentinel, biopsy, Left axillary - THERE IS NO EVIDENCE OF CARCINOMA IN 1 OF 1 LYMPH NODE (0/1). - SEE COMMENT. 4. Lymph node, sentinel, biopsy, Left axillary - THERE IS NO EVIDENCE OF CARCINOMA IN 1 OF 1 LYMPH NODE (0/1). - SEE COMMENT. 5. Lymph node, sentinel, biopsy, Left axillary - THERE IS NO EVIDENCE OF CARCINOMA IN 1 OF 1 LYMPH NODE (0/1). - SEE COMMENT 6. Breast, excision, Left, additional medial margin - BENIGN BREAST PARENCHYMA. - THERE IS NO EVIDENCE OF MALIGNANCY. - SEE COMMENT. 7. Breast, excision, Left, additional inferior margin - BENIGN BREAST PARENCHYMA. - THERE IS NO EVIDENCE OF MALIGNANCY. - SEE COMMENT.  09/20/14 Diagnosis Breast, left, needle core biopsy, 3 o'clock - INVASIVE DUCTAL CARCINOMA. - DUCTAL CARCINOMA IN SITU. - SEE COMMENT.  Receptor Status: ER(98%+), PR (99%+), Her2-neu (positive)  Did patient present with symptoms (if so, please note symptoms) or was this found on screening mammography?: Earlier this fall the patient palpated a lump within the upper outer aspect of her left breast. She brought this to the attention of her gynecologist and bilateral digital diagnostic mammography and left breast ultrasonography was performed at the breast Center.   Past/Anticipated interventions by surgeon, if any: 03/06/15 -Procedure: RADIOACTIVE SEED GUIDED PARTIAL MASTECTOMY WITH AXILLARY SENTINEL LYMPH NODE BIOPSY;  Surgeon:  Rolm Bookbinder, MD;  Location: [facility];  Service: General;  Laterality: Left; 09/20/14 - left breast biopsy   Past/Anticipated interventions by medical oncology, if any: neoadjuvant treatment consisting of carboplatin, docetaxel, trastuzumab and pertuzumab given every 21 days x6,completed 02/10/2015.  Trastuzumab to be continued to total one year (through October 2016).  Antiestrogens to follow radiation  Lymphedema issues, if any: a small amount in her left upper arm  Pain issues, if any:  no some discomfort in her left arm.   GYNECOLOGIC HISTORY: No LMP recorded.  menarche age 36, first live birth age 30. The patient is GX P2. She was still having regular periods at the start of chemotherapy. She was on birth control pills on and off for the last 15 years, stopping in October of 2015.   SAFETY ISSUES:  Prior radiation? no  Pacemaker/ICD? no  Possible current pregnancy?no  Is the patient on methotrexate? no  Current Complaints / other details:  Patient's husband is a Immunologist at [employer].  She has 2 children.

## 2015-03-25 ENCOUNTER — Other Ambulatory Visit (HOSPITAL_BASED_OUTPATIENT_CLINIC_OR_DEPARTMENT_OTHER): Payer: 59

## 2015-03-25 ENCOUNTER — Other Ambulatory Visit: Payer: 59

## 2015-03-25 ENCOUNTER — Ambulatory Visit (HOSPITAL_BASED_OUTPATIENT_CLINIC_OR_DEPARTMENT_OTHER): Payer: 59

## 2015-03-25 ENCOUNTER — Telehealth: Payer: Self-pay | Admitting: Oncology

## 2015-03-25 ENCOUNTER — Ambulatory Visit (HOSPITAL_BASED_OUTPATIENT_CLINIC_OR_DEPARTMENT_OTHER): Payer: 59 | Admitting: Oncology

## 2015-03-25 ENCOUNTER — Encounter: Payer: Self-pay | Admitting: *Deleted

## 2015-03-25 VITALS — BP 108/72 | HR 60 | Temp 98.2°F | Resp 18 | Ht 66.0 in | Wt 143.2 lb

## 2015-03-25 DIAGNOSIS — D701 Agranulocytosis secondary to cancer chemotherapy: Secondary | ICD-10-CM | POA: Diagnosis not present

## 2015-03-25 DIAGNOSIS — Z5112 Encounter for antineoplastic immunotherapy: Secondary | ICD-10-CM | POA: Diagnosis not present

## 2015-03-25 DIAGNOSIS — C773 Secondary and unspecified malignant neoplasm of axilla and upper limb lymph nodes: Secondary | ICD-10-CM | POA: Diagnosis not present

## 2015-03-25 DIAGNOSIS — C50812 Malignant neoplasm of overlapping sites of left female breast: Secondary | ICD-10-CM

## 2015-03-25 DIAGNOSIS — C50412 Malignant neoplasm of upper-outer quadrant of left female breast: Secondary | ICD-10-CM

## 2015-03-25 DIAGNOSIS — G620 Drug-induced polyneuropathy: Secondary | ICD-10-CM | POA: Diagnosis not present

## 2015-03-25 DIAGNOSIS — T451X5A Adverse effect of antineoplastic and immunosuppressive drugs, initial encounter: Secondary | ICD-10-CM

## 2015-03-25 LAB — CBC WITH DIFFERENTIAL/PLATELET
BASO%: 0.8 % (ref 0.0–2.0)
BASOS ABS: 0 10*3/uL (ref 0.0–0.1)
EOS%: 2 % (ref 0.0–7.0)
Eosinophils Absolute: 0.1 10*3/uL (ref 0.0–0.5)
HEMATOCRIT: 37.7 % (ref 34.8–46.6)
HEMOGLOBIN: 12.4 g/dL (ref 11.6–15.9)
LYMPH%: 41.4 % (ref 14.0–49.7)
MCH: 30.5 pg (ref 25.1–34.0)
MCHC: 32.9 g/dL (ref 31.5–36.0)
MCV: 92.9 fL (ref 79.5–101.0)
MONO#: 0.2 10*3/uL (ref 0.1–0.9)
MONO%: 5.9 % (ref 0.0–14.0)
NEUT%: 49.9 % (ref 38.4–76.8)
NEUTROS ABS: 1.3 10*3/uL — AB (ref 1.5–6.5)
Platelets: 193 10*3/uL (ref 145–400)
RBC: 4.06 10*6/uL (ref 3.70–5.45)
RDW: 12.7 % (ref 11.2–14.5)
WBC: 2.6 10*3/uL — ABNORMAL LOW (ref 3.9–10.3)
lymph#: 1.1 10*3/uL (ref 0.9–3.3)

## 2015-03-25 LAB — COMPREHENSIVE METABOLIC PANEL (CC13)
ALT: 22 U/L (ref 0–55)
ANION GAP: 7 meq/L (ref 3–11)
AST: 23 U/L (ref 5–34)
Albumin: 3.8 g/dL (ref 3.5–5.0)
Alkaline Phosphatase: 54 U/L (ref 40–150)
BILIRUBIN TOTAL: 0.79 mg/dL (ref 0.20–1.20)
BUN: 9.7 mg/dL (ref 7.0–26.0)
CO2: 28 mEq/L (ref 22–29)
CREATININE: 0.7 mg/dL (ref 0.6–1.1)
Calcium: 8.9 mg/dL (ref 8.4–10.4)
Chloride: 105 mEq/L (ref 98–109)
Glucose: 92 mg/dl (ref 70–140)
Potassium: 3.8 mEq/L (ref 3.5–5.1)
SODIUM: 140 meq/L (ref 136–145)
Total Protein: 6.4 g/dL (ref 6.4–8.3)

## 2015-03-25 MED ORDER — DIPHENHYDRAMINE HCL 25 MG PO CAPS
ORAL_CAPSULE | ORAL | Status: AC
  Filled 2015-03-25: qty 1

## 2015-03-25 MED ORDER — ACETAMINOPHEN 325 MG PO TABS
650.0000 mg | ORAL_TABLET | Freq: Once | ORAL | Status: AC
  Administered 2015-03-25: 650 mg via ORAL

## 2015-03-25 MED ORDER — DIPHENHYDRAMINE HCL 25 MG PO CAPS
25.0000 mg | ORAL_CAPSULE | Freq: Once | ORAL | Status: AC
  Administered 2015-03-25: 25 mg via ORAL

## 2015-03-25 MED ORDER — TRASTUZUMAB CHEMO INJECTION 440 MG
6.0000 mg/kg | Freq: Once | INTRAVENOUS | Status: AC
  Administered 2015-03-25: 399 mg via INTRAVENOUS
  Filled 2015-03-25: qty 19

## 2015-03-25 MED ORDER — HEPARIN SOD (PORK) LOCK FLUSH 100 UNIT/ML IV SOLN
500.0000 [IU] | Freq: Once | INTRAVENOUS | Status: AC | PRN
  Administered 2015-03-25: 500 [IU]
  Filled 2015-03-25: qty 5

## 2015-03-25 MED ORDER — SODIUM CHLORIDE 0.9 % IJ SOLN
10.0000 mL | INTRAMUSCULAR | Status: DC | PRN
  Administered 2015-03-25: 10 mL
  Filled 2015-03-25: qty 10

## 2015-03-25 MED ORDER — SODIUM CHLORIDE 0.9 % IV SOLN
Freq: Once | INTRAVENOUS | Status: AC
  Administered 2015-03-25: 09:00:00 via INTRAVENOUS

## 2015-03-25 MED ORDER — ACETAMINOPHEN 325 MG PO TABS
ORAL_TABLET | ORAL | Status: AC
  Filled 2015-03-25: qty 2

## 2015-03-25 NOTE — Patient Instructions (Signed)
[facility] Discharge Instructions for Patients Receiving Chemotherapy  Today you received the following chemotherapy agent: Herceptin   To help prevent nausea and vomiting after your treatment, we encourage you to take your nausea medication as prescribed.    If you develop nausea and vomiting that is not controlled by your nausea medication, call the clinic.   BELOW ARE SYMPTOMS THAT SHOULD BE REPORTED IMMEDIATELY:  *FEVER GREATER THAN 100.5 F  *CHILLS WITH OR WITHOUT FEVER  NAUSEA AND VOMITING THAT IS NOT CONTROLLED WITH YOUR NAUSEA MEDICATION  *UNUSUAL SHORTNESS OF BREATH  *UNUSUAL BRUISING OR BLEEDING  TENDERNESS IN MOUTH AND THROAT WITH OR WITHOUT PRESENCE OF ULCERS  *URINARY PROBLEMS  *BOWEL PROBLEMS  UNUSUAL RASH Items with * indicate a potential emergency and should be followed up as soon as possible.  Feel free to call the clinic you have any questions or concerns. The clinic phone number is [phone].  Please show the [facility] at check-in to the Emergency Department and triage nurse.

## 2015-03-25 NOTE — Progress Notes (Signed)
Nancy Austin  [phone] [phone]     ID: Nancy Austin DOB: March 29, 1976  MR#: [id number]  FUX#:[id number]  Patient Care Team: Paula Compton, MD as PCP - General (Obstetrics and Gynecology) Rolm Bookbinder, MD as Consulting Physician (General Surgery) Gery Pray, MD as Consulting Physician (Radiation Oncology) Chauncey Cruel, MD as Consulting Physician (Oncology) OTHER MD:  CHIEF COMPLAINT: Triple positive breast cancer  CURRENT TREATMENT: Adjuvant radiation pending; continuing anti-HER-2 immunotherapy   BREAST CANCER HISTORY: From the original Intake note:  Nancy Austin herself found a lump in her left breast early October 2015 and brought it to her gynecologist attention. On 09/20/2014 she was set up for bilateral diagnostic mammography and left breast ultrasonography of the breast Center. This was the patient's first ever mammogram. In the area of concern in the left breast there was suspicious pleomorphic calcifications spanning 1.1 cm. There was no discrete mammographic mass in this dense breasts (category C.). On physical exam, there was a palpable firm at 1.5 cm mass at the 3:00 position in the left breast. By ultrasound this was irregular and hypoechoic and measured 1.8 cm. Ultrasound of the left axilla was unremarkable. Aside from multiple cysts in the left breast there were no other findings of concern.  On the same day, 09/20/2014, the patient underwent biopsy of the left breast palpable mass. This showed ([id number] [phone]) and invasive ductal carcinoma, grade 2, estrogen receptor 100% positive, progesterone receptor 89% positive, both with strong staining intensity, with an MIB-1 of 16%. HER-2 was amplified with a signals ratio of 5.07 and a copy number per cell of 6.85. Paragraph on 09/30/2014 the patient underwent bilateral breast MRIs. This showed a 1.7 cm mass in the upper outer quadrant of the left breast. There was no other suspicious  finding in either breast and no abnormal appearing adenopathy.  The patient's subsequent history is as detailed below.  INTERVAL HISTORY: Nancy Austin teturns today for follow-up of her breast cancer accompanied by her mother. Since her last visit here she underwent left lumpectomy and sentinel lymph node sampling (03/06/2015, [id number]). This showed a 1.7 cm area of residual invasive ductal carcinoma, grade 1 involving one of 4 sentinel lymph nodes. Repeat prognostic panel showed the tumor to be estrogen receptor positive at 98%, progesterone receptor positive at 99%, both with strong staining intensity, and still HER-2 positive, with a signals ratio of 4.62 and a copy number per cell of 6.00. Margins were cleared. She is continuing with anti-HER-2 immunotherapy and is ready to proceed to adjuvant radiation.  REVIEW OF SYSTEMS: Nancy Austin did well with her surgery. She had some soreness initially, but that has largely resolved. She has some induration under the scar which concerns her. We talked about her becoming very aware of those areas. Her range of motion is not what she would like and she is receiving physical therapy. She has occasional hot flashes, controlled in nighttime gabapentin. She is not experiencing vaginal dryness or insomnia. She never really gained much weight. A detailed review of systems today was otherwise stable.  PAST MEDICAL HISTORY: Past Medical History  Diagnosis Date  . Breast cancer of upper-outer quadrant of left female breast 09/24/2014  . History of chemotherapy     finished 02/10/2015    PAST SURGICAL HISTORY: Past Surgical History  Procedure Laterality Date  . Tonsillectomy  1996  . Portacath placement N/A 10/15/2014    Procedure: INSERTION PORT-A-CATH;  Surgeon: Rolm Bookbinder, MD;  Location: [facility];  Service: General;  Laterality: N/A;  . Radioactive seed guided mastectomy with axillary sentinel lymph node biopsy Left 03/06/2015    Procedure: RADIOACTIVE SEED  GUIDED PARTIAL MASTECTOMY WITH AXILLARY SENTINEL LYMPH NODE BIOPSY;  Surgeon: Rolm Bookbinder, MD;  Location: [facility];  Service: General;  Laterality: Left;    FAMILY HISTORY Family History  Problem Relation Age of Onset  . Skin cancer Father   . Prostate cancer Maternal Uncle 58    currently 62  . Multiple myeloma Paternal Grandmother 51    Deceased 71  The patient's parents are both living. The patient has one brother, no sisters. One grandmother was diagnosed with multiple myeloma at age 64. There is no history of breast or ovarian cancer in the family.   GYNECOLOGIC HISTORY:  No LMP recorded.  menarche age 36, first live birth age 40. The patient is GX P2. She was still having regular periods at the start of chemotherapy. She was on birth control pills on and off for the last 15 years, stopping in October of 2015.   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 Her husband Rolla Plate works as a Immunologist at [employer]. Their children are Sam age 94 and Terrence Dupont age 92.    ADVANCED DIRECTIVES: In place   HEALTH MAINTENANCE: History  Substance Use Topics  . Smoking status: Never Smoker   . Smokeless tobacco: Never Used  . Alcohol Use: Yes     Comment: 1-3 drinks/week     Colonoscopy:  PAP:  November 2014  Bone density:  Lipid panel:  Allergies  Allergen Reactions  . Morphine And Related Nausea And Vomiting  . Tegaderm Ag Mesh [Silver] Rash    Current Outpatient Prescriptions  Medication Sig Dispense Refill  . gabapentin (NEURONTIN) 300 MG capsule Take 1 capsule (300 mg total) by mouth at bedtime. 30 capsule 0  . Multiple Vitamin (MULTIVITAMIN) tablet Take 1 tablet by mouth daily.     No current facility-administered medications for this visit.    OBJECTIVE: young white woman in no acute distress Filed Vitals:   03/25/15 0825  BP: 108/72  Pulse: 60  Temp: 98.2 F (36.8 C)  Resp: 18     Body mass index is 23.12 kg/(m^2).     ECOG FS:1 - Symptomatic but completely ambulatory  Sclerae unicteric, EOMs intact Oropharynx clear and moist No cervical or supraclavicular adenopathy Lungs no rales or rhonchi Heart regular rate and rhythm Abd soft, nontender, positive bowel sounds MSK no focal spinal tenderness, no left upper extremity lymphedema Neuro: nonfocal, well oriented, positive affect Breasts: the right breast is unremarkable. The left breast is status post recent lumpectomy. The cosmetic result is excellent. The incisions are healing nicely. There is no erythema. There is some induration just lateral and superior to the areola and a little bit around the incision itself. The left axilla is benign.  LAB RESULTS:  CMP     Component Value Date/Time   NA 140 03/25/2015 0812   NA 144 10/02/2014 1206   K 3.8 03/25/2015 0812   K 3.9 10/02/2014 1206   CL 104 10/02/2014 1206   CO2 28 03/25/2015 0812   CO2 26 10/02/2014 1206   GLUCOSE 92 03/25/2015 0812   GLUCOSE 103* 10/02/2014 1206   BUN 9.7 03/25/2015 0812   BUN 11 10/02/2014 1206   CREATININE 0.7 03/25/2015 0812   CREATININE 0.87 10/02/2014 1206   CALCIUM 8.9 03/25/2015 0812   CALCIUM 9.4 10/02/2014 1206  PROT 6.4 03/25/2015 0812   PROT 7.3 10/02/2014 1206   ALBUMIN 3.8 03/25/2015 0812   ALBUMIN 3.9 10/02/2014 1206   AST 23 03/25/2015 0812   AST 20 10/02/2014 1206   ALT 22 03/25/2015 0812   ALT 16 10/02/2014 1206   ALKPHOS 54 03/25/2015 0812   ALKPHOS 45 10/02/2014 1206   BILITOT 0.79 03/25/2015 0812   BILITOT 0.4 10/02/2014 1206    I No results found for: SPEP  Lab Results  Component Value Date   WBC 2.6* 03/25/2015   NEUTROABS 1.3* 03/25/2015   HGB 12.4 03/25/2015   HCT 37.7 03/25/2015   MCV 92.9 03/25/2015   PLT 193 03/25/2015      Chemistry      Component Value Date/Time   NA 140 03/25/2015 0812   NA 144 10/02/2014 1206   K 3.8 03/25/2015 0812   K 3.9 10/02/2014 1206   CL 104 10/02/2014 1206   CO2 28 03/25/2015 0812    CO2 26 10/02/2014 1206   BUN 9.7 03/25/2015 0812   BUN 11 10/02/2014 1206   CREATININE 0.7 03/25/2015 0812   CREATININE 0.87 10/02/2014 1206      Component Value Date/Time   CALCIUM 8.9 03/25/2015 0812   CALCIUM 9.4 10/02/2014 1206   ALKPHOS 54 03/25/2015 0812   ALKPHOS 45 10/02/2014 1206   AST 23 03/25/2015 0812   AST 20 10/02/2014 1206   ALT 22 03/25/2015 0812   ALT 16 10/02/2014 1206   BILITOT 0.79 03/25/2015 0812   BILITOT 0.4 10/02/2014 1206       No results found for: LABCA2  No components found for: LABCA125  No results for input(s): INR in the last 168 hours.  Urinalysis No results found for: COLORURINE  STUDIES: Nm Sentinel Node Inj-no Rpt (breast)  03/06/2015   CLINICAL DATA: left axillary sentinel node biopsy   Sulfur colloid was injected intradermally by the nuclear medicine  technologist for breast cancer sentinel node localization.    Mm Breast Surgical Specimen  03/06/2015   CLINICAL DATA:  Breast cancer of the left breast. Patient had radioactive seed localization prior to lumpectomy.  EXAM: SPECIMEN RADIOGRAPH OF THE LEFT BREAST  COMPARISON:  Previous exam(s).  FINDINGS: Status post excision of the left breast. The radioactive seed and biopsy marker clip are present, completely intact, and were marked for pathology. Calcifications are present in the specimen, and near the specimen margin. Dr. Donne Hazel has taken additional margins since the specimen radiograph was performed.  IMPRESSION: Specimen radiograph of the left breast.   Electronically Signed   By: Curlene Dolphin M.D.   On: 03/06/2015 14:28   Mm Lt Radioactive Seed Loc Mammo Guide  03/05/2015   CLINICAL DATA:  Preoperative radioactive seed localization at the site of biopsy proven breast cancer left breast 3 o'clock location marked with a ribbon shaped clip  EXAM: MAMMOGRAPHIC GUIDED RADIOACTIVE SEED LOCALIZATION OF THE left BREAST  COMPARISON:  Previous exam(s).  FINDINGS: Patient presents for  radioactive seed localization prior to lumpectomy. I met with the patient and we discussed the procedure of seed localization including benefits and alternatives. We discussed the high likelihood of a successful procedure. We discussed the risks of the procedure including infection, bleeding, tissue injury and further surgery. We discussed the low dose of radioactivity involved in the procedure. Informed, written consent was given.  The usual time-out protocol was performed immediately prior to the procedure.  Using mammographic guidance, sterile technique, 2% lidocaine and an I-125 radioactive seed,  the ribbon shaped clip was localized using a lateral to medial approach. The follow-up mammogram images confirm the seed in the expected location and were marked for Dr. Donne Hazel.  Follow-up survey of the patient confirms presence of the radioactive seed.  Order number of I-125 seed:  [id number].  Total activity:  0.250 mCi  Reference Date: 02/12/2015  The patient tolerated the procedure well and was released from the [facility]. She was given instructions regarding seed removal.  IMPRESSION: Radioactive seed localization left breast. No apparent complications.   Electronically Signed   By: Conchita Paris M.D.   On: 03/05/2015 10:20    ASSESSMENT: 39 y.o. BRCA negative [place] woman s/p Left breast upper outer quadrant biopsy 09/20/2014, for a clinical T1c N0, stage IA invasive ductal carcinoma, grade 2, estrogen and progesterone receptor positive, HER-2 amplified with a signal is ratio of 5.07, and an MIB-1 of 16%  (1) neoadjuvant treatment consisting of carboplatin, docetaxel, trastuzumab and pertuzumab given every 21 days x6,  completed 02/10/2015  (a) breast MRI 02/11/2015 shows a complete radiologic response  (2) left lumpectomy and sentinel lymph node sampling 03/06/2015 showed a residual ypT1c ypN1a, stage IIA invasive ductal carcinoma, grade 1, with repeat prognostic panel still triple positive,  and negative margins  (3) trastuzumab to be continued to total one year (through October 2016)  (a) most recent echo 01/10/2015 shows an EF of 55-60%  (4) adjuvant radiation to follow surgery   (5) antiestrogens to follow radiation  (6)  the [facility] geneprofile  ([facility] genetics) obtained November 2015 did not reveal a mutation in ATM, BARD1, BRCA1, BRCA BRIP1, CDH1, CHEK2, MRE11A, MUTYH, NBN, NF1, PALB2, PTEN, RAD50, RAD51C, RAD51D, or TP53  PLAN: Ceirra has recovered well with her surgery and is now ready to start radiation. We discussed the fact that she did have some residual tumor and a positive lymph node. In general, estrogen receptor positive cancers do not tend to achieve complete pathologic response. The chemotherapy clears out the more aggressive portion of the tumor (which was read as grade 2 initially) but the less aggressive portion, which is the estrogen receptor positive portion, tends to survive (this is the grade 1 portion)  This is where anti-estrogens are the [place]. She will be on anti-estrogens for 10 years. She did have a menstrual period early March but not since. That could've been the last hurrah, but more likely this was the beginning of resumption of menses. Is so we will start her on Zoladex monthly until she decides whether she can accept full menopause, and at that point we will proceed to bilateral salpingo-oophorectomy for permanent menopause. She understands that this clearly improves disease-free survival in the young women with positive lymph nodes like her.  [name] has a good understanding of the overall plan. She agrees with it. She knows the goal of treatment in her case is cure. She will call with any problems that may develop before her next visit here. Chauncey Cruel, MD   03/25/2015 9:08 AM

## 2015-03-25 NOTE — Telephone Encounter (Signed)
Appointments made per pof and patient will get a new schedule in chemo today °

## 2015-03-25 NOTE — Progress Notes (Signed)
Met with pt during herceptin infusion to discuss needs after post op. Denies needs at this time. Encourage pt to call with questions or concerns. Received verbal understanding.

## 2015-03-26 ENCOUNTER — Ambulatory Visit: Payer: 59

## 2015-03-26 DIAGNOSIS — M25512 Pain in left shoulder: Secondary | ICD-10-CM

## 2015-03-26 DIAGNOSIS — M25612 Stiffness of left shoulder, not elsewhere classified: Secondary | ICD-10-CM | POA: Diagnosis not present

## 2015-03-26 NOTE — Therapy (Signed)
[place], [place], [address] Phone: [phone]   Fax:  [phone]  Physical Therapy Treatment  Patient Details  Name: Nancy Austin MRN: [id number] Date of Birth: 1976-05-20 Referring Provider:  Paula Compton, MD  Encounter Date: 03/26/2015      PT End of Session - 03/26/15 1019    Visit Number 3   Number of Visits 12   Date for PT Re-Evaluation 04/18/15   PT Start Time 0936   PT Stop Time 1018   PT Time Calculation (min) 42 min      Past Medical History  Diagnosis Date  . Breast cancer of upper-outer quadrant of left female breast 09/24/2014  . History of chemotherapy     finished 02/10/2015    Past Surgical History  Procedure Laterality Date  . Tonsillectomy  1996  . Portacath placement N/A 10/15/2014    Procedure: INSERTION PORT-A-CATH;  Surgeon: Rolm Bookbinder, MD;  Location: [facility];  Service: General;  Laterality: N/A;  . Radioactive seed guided mastectomy with axillary sentinel lymph node biopsy Left 03/06/2015    Procedure: RADIOACTIVE SEED GUIDED PARTIAL MASTECTOMY WITH AXILLARY SENTINEL LYMPH NODE BIOPSY;  Surgeon: Rolm Bookbinder, MD;  Location: [facility];  Service: General;  Laterality: Left;    There were no vitals filed for this visit.  Visit Diagnosis:  Left shoulder pain  Shoulder stiffness, left      Subjective Assessment - 03/26/15 0938    Subjective Just a little sore after last treatment, but nothing out of the normal. Cord feels about the same.   Currently in Pain? No/denies                       [facility] Adult PT Treatment/Exercise - 03/26/15 0001    Manual Therapy   Myofascial Release left UE myofascial pulling in supine and in right sidelying; left scapular mobilization with emphasis on protraction and depression   Manual Lymphatic Drainage (MLD) In Supine: Rt axilla and Lt inguinal nodes, anterior inter-axillary and Lt  axillo-inguinal anastomosis and Lt UE from dorsal hand to lateral shoulder.    Passive ROM Left shoulder stretch to tolerance in abduction and flexion; left UE neural tension stretches; gentle massage (stroking) of cording at left axilla while left arm is in abduction (and palpable pop felt, without pain to patient)                   Short Term Clinic Goals - 03/19/15 1220    CC Short Term Goal  #1   Title short term goals= long term goals             Long Term Clinic Goals - 03/19/15 1220    CC Long Term Goal  #1   Title pt will verbalize knowledge of lymphedem risk reduction pratices.   Time 4   Period Weeks   Status New   CC Long Term Goal  #2   Title pt will have left shoulder abduction of  120 degrees so that she can receive radiation treatmet   Baseline 74   Time 4   Period Weeks   Status New   CC Long Term Goal  #3   Title pt will report a decrease in pain by 50% so she can perform daily tasks with greater ease    Time 4   Period Weeks   Status New   CC Long Term Goal  #4  Title pt will decrease DASH score to 20 demonstrating functional improvement of arm    Baseline 43.18   Time 4   Period Weeks   Status New            Plan - 03/26/15 1021    Clinical Impression Statement Pt cording though still palpable, did become less tight throughout treatment. Noticed improved PROM during stretching as well.    Pt will benefit from skilled therapeutic intervention in order to improve on the following deficits Decreased strength;Pain;Decreased knowledge of use of DME;Increased edema;Decreased range of motion   Rehab Potential Excellent   Clinical Impairments Affecting Rehab Potential previous chemotherapy   PT Frequency 3x / week   PT Duration 4 weeks  likely decrease 2x/wk once radiation starts   PT Treatment/Interventions Therapeutic exercise;Passive range of motion;Manual techniques   PT Next Visit Plan Continue manual techniques for ROM and release of  cording, neural tension.  Manual lymph drainage prn.  Progress HEP. Assess goals next visit.   Consulted and Agree with Plan of Care Patient        Problem List Patient Active Problem List   Diagnosis Date Noted  . Chemotherapy induced neutropenia 02/03/2015  . Bronchitis 01/23/2015  . 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 Constipation 12/09/2014  . Hot flashes 12/09/2014  . Spotting 11/20/2014  . Vasovagal near syncope 11/16/2014  . Breast cancer of upper-outer quadrant of left female breast 09/24/2014    Otelia Limes, PTA 03/26/2015, 10:35 AM  [place], [place], [address] Phone: [phone]   Fax:  [phone]

## 2015-03-27 ENCOUNTER — Ambulatory Visit
Admission: RE | Admit: 2015-03-27 | Discharge: 2015-03-27 | Disposition: A | Discharge status: Home / Self Care | Payer: 59 | Source: Ambulatory Visit | Attending: Radiation Oncology | Admitting: Radiation Oncology

## 2015-03-27 ENCOUNTER — Other Ambulatory Visit: Payer: Self-pay | Admitting: Oncology

## 2015-03-27 ENCOUNTER — Encounter: Payer: Self-pay | Admitting: Radiation Oncology

## 2015-03-27 ENCOUNTER — Ambulatory Visit: Payer: 59

## 2015-03-27 VITALS — BP 108/75 | HR 55 | Temp 98.5°F | Resp 12 | Ht 66.0 in | Wt 143.3 lb

## 2015-03-27 DIAGNOSIS — C50412 Malignant neoplasm of upper-outer quadrant of left female breast: Secondary | ICD-10-CM

## 2015-03-27 DIAGNOSIS — Z9221 Personal history of antineoplastic chemotherapy: Secondary | ICD-10-CM | POA: Diagnosis not present

## 2015-03-27 DIAGNOSIS — M25612 Stiffness of left shoulder, not elsewhere classified: Secondary | ICD-10-CM

## 2015-03-27 DIAGNOSIS — D0592 Unspecified type of carcinoma in situ of left breast: Secondary | ICD-10-CM | POA: Insufficient documentation

## 2015-03-27 DIAGNOSIS — Z510 Encounter for antineoplastic radiation therapy: Secondary | ICD-10-CM | POA: Insufficient documentation

## 2015-03-27 DIAGNOSIS — Z170 Estrogen receptor positive status [ER+]: Secondary | ICD-10-CM | POA: Insufficient documentation

## 2015-03-27 DIAGNOSIS — L598 Other specified disorders of the skin and subcutaneous tissue related to radiation: Secondary | ICD-10-CM | POA: Insufficient documentation

## 2015-03-27 DIAGNOSIS — M25512 Pain in left shoulder: Secondary | ICD-10-CM

## 2015-03-27 NOTE — Progress Notes (Signed)
Radiation Oncology         [phone] ________________________________  Name: Nancy Austin MRN: [id number]  Date: 03/27/2015  DOB: Mar 28, 1976  Reevaluation Note  CC: Logan Bores, MD  Rolm Bookbinder, MD    ICD-9-CM ICD-10-CM   1. Breast cancer of upper-outer quadrant of left female breast 174.4 C50.412     Diagnosis:   ypT1c ypN1a, stage IIA invasive ductal carcinoma of the left breast, grade 1,  negative margins   Narrative:  The patient returns today for further evaluation at the courtesy of Dr. Donne Hazel and Dr. Jana Hakim. The patient was initially seen in the multidisciplinary breast clinic last year. At that time was recommended patient proceed with neoadjuvant chemotherapy.   neoadjuvant treatment consisted of carboplatin, docetaxel, trastuzumab and pertuzumab given every 21 days x6.  She completed neoadjuvant chemotherapy on 02/10/2015. The patient underwent a repeat MRI which showed a complete radiological response. Patient was taken to the operating room by Dr. Donne Hazel on March 24 at which time the patient underwent a left lumpectomy and sentinel lymph node sampling. Patient was found to have residual 1.7 cm tumor within the upper outer aspect of the left breast. There was associated ductal carcinoma in situ, low-grade. The initial surgical margins were involved along the medial margin. Intraoperatively the patient underwent reexcision of this area clearing the surgical margin. Patient was found to have 1 out of 4 lymph nodes (sentinel) involved with tumor. The patient did not have a formal axillary dissection. The patient has done well since her surgery.  She has met with medical oncology who is recommended hormonal therapy after completion of radiation therapy as breast conservation treatment.                    ALLERGIES:  is allergic to morphine and related and tegaderm ag mesh.  Meds: Current Outpatient Prescriptions  Medication Sig Dispense Refill  . gabapentin  (NEURONTIN) 300 MG capsule Take 1 capsule (300 mg total) by mouth at bedtime. 30 capsule 0  . Multiple Vitamin (MULTIVITAMIN) tablet Take 1 tablet by mouth daily.     No current facility-administered medications for this encounter.    Physical Findings: The patient is in no acute distress. Patient is alert and oriented.  height is _0  (1.676 m) and weight is 143 lb 4.8 oz (65 kg). Her oral temperature is 98.5 F (36.9 C). Her blood pressure is 108/75 and her pulse is 55. Her respiration is 12. . No palpable subclavicular or axillary adenopathy. The lungs are clear to auscultation. The heart has a regular rhythm and rate. Examination of the right breast reveals no mass or nipple discharge. Examination of left breast reveals a well healing scar in the upper outer quadrant without  signs of drainage or infection. The left nipple is mildly inverted which is been a chronic situation for the patient.  Lab Findings: Lab Results  Component Value Date   WBC 2.6* 03/25/2015   HGB 12.4 03/25/2015   HCT 37.7 03/25/2015   MCV 92.9 03/25/2015   PLT 193 03/25/2015    Radiographic Findings: Nm Sentinel Node Inj-no Rpt (breast)  03/06/2015   CLINICAL DATA: left axillary sentinel node biopsy   Sulfur colloid was injected intradermally by the nuclear medicine  technologist for breast cancer sentinel node localization.    Mm Breast Surgical Specimen  03/06/2015   CLINICAL DATA:  Breast cancer of the left breast. Patient had radioactive seed localization prior to lumpectomy.  EXAM: SPECIMEN RADIOGRAPH  OF THE LEFT BREAST  COMPARISON:  Previous exam(s).  FINDINGS: Status post excision of the left breast. The radioactive seed and biopsy marker clip are present, completely intact, and were marked for pathology. Calcifications are present in the specimen, and near the specimen margin. Dr. Donne Hazel has taken additional margins since the specimen radiograph was performed.  IMPRESSION: Specimen radiograph of the  left breast.   Electronically Signed   By: Curlene Dolphin M.D.   On: 03/06/2015 14:28   Mm Lt Radioactive Seed Loc Mammo Guide  03/05/2015   CLINICAL DATA:  Preoperative radioactive seed localization at the site of biopsy proven breast cancer left breast 3 o'clock location marked with a ribbon shaped clip  EXAM: MAMMOGRAPHIC GUIDED RADIOACTIVE SEED LOCALIZATION OF THE left BREAST  COMPARISON:  Previous exam(s).  FINDINGS: Patient presents for radioactive seed localization prior to lumpectomy. I met with the patient and we discussed the procedure of seed localization including benefits and alternatives. We discussed the high likelihood of a successful procedure. We discussed the risks of the procedure including infection, bleeding, tissue injury and further surgery. We discussed the low dose of radioactivity involved in the procedure. Informed, written consent was given.  The usual time-out protocol was performed immediately prior to the procedure.  Using mammographic guidance, sterile technique, 2% lidocaine and an I-125 radioactive seed, the ribbon shaped clip was localized using a lateral to medial approach. The follow-up mammogram images confirm the seed in the expected location and were marked for Dr. Donne Hazel.  Follow-up survey of the patient confirms presence of the radioactive seed.  Order number of I-125 seed:  [id number].  Total activity:  0.250 mCi  Reference Date: 02/12/2015  The patient tolerated the procedure well and was released from the [facility]. She was given instructions regarding seed removal.  IMPRESSION: Radioactive seed localization left breast. No apparent complications.   Electronically Signed   By: Conchita Paris M.D.   On: 03/05/2015 10:20    Impression:   ypT1c ypN1a, stage IIA invasive ductal carcinoma of the left breast, grade 1,  negative margins.  The patient would be a good candidate for breast conservation with radiation therapy directed at the left breast. As above the  patient was found to have 1 out of 4 sentinel lymph nodes showing metastasis. Her case was presented to the multidisciplinary breast conference earlier this week and consensus was that the patient proceed with radiation of the axillary area at the same time as her breast radiation treatment. In addition this was the patient's desire. She does understand that she does have the option to undergo axillary dissection but would prefer radiation to cover the axillary area. I discussed the treatment course side effects and potential toxicities of radiation therapy in this situation with the patient. She appears to understand and wishes to proceed with treatment.  Plan:  Simulation and planning in the near future with treatments to begin approximately 5-6 weeks postop.  She will receive approximately 45 gray directed at the left breast and axillary region followed by a boost to the lumpectomy cavity for cumulative dose to this area of approximately 60 gray.  ____________________________________ Blair Promise, MD

## 2015-03-27 NOTE — Therapy (Signed)
[place], [place], [address] Phone: [phone]   Fax:  [phone]  Physical Therapy Treatment  Patient Details  Name: Nancy Austin MRN: [id number] Date of Birth: 31-Mar-1976 Referring Provider:  Paula Compton, MD  Encounter Date: 03/27/2015      PT End of Session - 03/27/15 0934    Visit Number 4   Number of Visits 12   Date for PT Re-Evaluation 04/18/15   PT Start Time 0851   PT Stop Time 0933   PT Time Calculation (min) 42 min      Past Medical History  Diagnosis Date  . Breast cancer of upper-outer quadrant of left female breast 09/24/2014  . History of chemotherapy     finished 02/10/2015    Past Surgical History  Procedure Laterality Date  . Tonsillectomy  1996  . Portacath placement N/A 10/15/2014    Procedure: INSERTION PORT-A-CATH;  Surgeon: Rolm Bookbinder, MD;  Location: [facility];  Service: General;  Laterality: N/A;  . Radioactive seed guided mastectomy with axillary sentinel lymph node biopsy Left 03/06/2015    Procedure: RADIOACTIVE SEED GUIDED PARTIAL MASTECTOMY WITH AXILLARY SENTINEL LYMPH NODE BIOPSY;  Surgeon: Rolm Bookbinder, MD;  Location: [facility];  Service: General;  Laterality: Left;    There were no vitals filed for this visit.  Visit Diagnosis:  Left shoulder pain  Shoulder stiffness, left      Subjective Assessment - 03/27/15 0852    Subjective Felt good after last visit.   Currently in Pain? No/denies            [facility] PT Assessment - 03/27/15 0001    AROM   Left Shoulder Flexion 106 Degrees   Left Shoulder ABduction 91 Degrees                   [facility] Adult PT Treatment/Exercise - 03/27/15 0001    Shoulder Exercises: Therapy Ball   Flexion 5 reps   ABduction 5 reps  With cuing for myofascial release at axillary cording    Manual Therapy   Myofascial Release left UE myofascial pulling in supine and in right  sidelying;    Manual Lymphatic Drainage (MLD) In Supine: Rt axilla and Lt inguinal nodes, anterior inter-axillary and Lt axillo-inguinal anastomosis and Lt UE from dorsal hand to lateral shoulder.    Passive ROM Left shoulder stretch to tolerance in abduction and flexion; left UE neural tension stretches; gentle massage (stroking) of cording at left axilla and anterior elbow while left arm is in abduction                PT Education - 03/27/15 0934    Education provided Yes   Education Details Rolling ball upwall for flexion and abduction   Person(s) Educated Patient   Methods Explanation;Demonstration   Comprehension Verbalized understanding;Returned demonstration           Short Term Clinic Goals - 03/19/15 1220    CC Short Term Goal  #1   Title short term goals= long term goals             Long Term Clinic Goals - 03/27/15 0854    CC Long Term Goal  #2   Title pt will have left shoulder abduction of  120 degrees so that she can receive radiation treatmet  Progress noted, 91 degrees 03/27/15   Status On-going   CC Long Term Goal  #3   Title pt will report a  decrease in pain by 50% so she can perform daily tasks with greater ease    Status Achieved   CC Long Term Goal  #4   Title pt will decrease DASH score to 20 demonstrating functional improvement of arm    Status On-going            Plan - 03/27/15 0934    Clinical Impression Statement Pts cording less palpable today and had increased PROM with stretching and ioncreased in AROM measurements.    Pt will benefit from skilled therapeutic intervention in order to improve on the following deficits Decreased strength;Pain;Decreased knowledge of use of DME;Increased edema;Decreased range of motion   Rehab Potential Excellent   Clinical Impairments Affecting Rehab Potential previous chemotherapy   PT Frequency 3x / week   PT Duration 4 weeks  likely decrease 2x/wk once radiation starts   PT  Treatment/Interventions Therapeutic exercise;Passive range of motion;Manual techniques   PT Next Visit Plan Continue manual techniques for ROM and release of cording, neural tension.  Manual lymph drainage prn.  Progress HEP prn.   Consulted and Agree with Plan of Care Patient        Problem List Patient Active Problem List   Diagnosis Date Noted  . Chemotherapy induced neutropenia 02/03/2015  . Bronchitis 01/23/2015  . 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 Constipation 12/09/2014  . Hot flashes 12/09/2014  . Spotting 11/20/2014  . Vasovagal near syncope 11/16/2014  . Breast cancer of upper-outer quadrant of left female breast 09/24/2014    Otelia Limes, PTA 03/27/2015, 9:37 AM  [place], [place], [address] Phone: [phone]   Fax:  [phone]

## 2015-03-27 NOTE — Progress Notes (Signed)
Please see the Nurse Progress Note in the MD Initial Consult Encounter for this patient. 

## 2015-03-28 ENCOUNTER — Other Ambulatory Visit: Payer: Self-pay | Admitting: Nurse Practitioner

## 2015-03-28 ENCOUNTER — Telehealth: Payer: Self-pay | Admitting: *Deleted

## 2015-03-28 ENCOUNTER — Encounter: Payer: Self-pay | Admitting: *Deleted

## 2015-03-28 ENCOUNTER — Telehealth: Payer: Self-pay | Admitting: Oncology

## 2015-03-28 NOTE — Telephone Encounter (Signed)
Left message to confirm injection appointment.

## 2015-03-28 NOTE — Telephone Encounter (Signed)
Pt called and relayed she started her period. Informed pt we will likely start her on Zoladex injections that Dr. Jana Hakim discussed with her earlier this week. Informed Dr. Jana Hakim of the start of her period.

## 2015-03-30 ENCOUNTER — Other Ambulatory Visit: Payer: Self-pay | Admitting: Oncology

## 2015-03-31 ENCOUNTER — Ambulatory Visit: Payer: 59 | Admitting: Physical Therapy

## 2015-03-31 ENCOUNTER — Telehealth: Payer: Self-pay | Admitting: Oncology

## 2015-03-31 DIAGNOSIS — M25512 Pain in left shoulder: Secondary | ICD-10-CM

## 2015-03-31 DIAGNOSIS — M25612 Stiffness of left shoulder, not elsewhere classified: Secondary | ICD-10-CM | POA: Diagnosis not present

## 2015-03-31 NOTE — Therapy (Signed)
[place], [place], [address] Phone: [phone]   Fax:  [phone]  Physical Therapy Treatment  Patient Details  Name: Nancy Austin MRN: [id number] Date of Birth: Jul 05, 1976 Referring Provider:  Rolm Bookbinder, MD  Encounter Date: 03/31/2015      PT End of Session - 03/31/15 0844    Visit Number 5   Number of Visits 12   Date for PT Re-Evaluation 04/18/15   PT Start Time 0805   PT Stop Time 0845   PT Time Calculation (min) 40 min   Activity Tolerance Patient limited by pain;Patient tolerated treatment well   Behavior During Therapy [facility] for tasks assessed/performed      Past Medical History  Diagnosis Date  . Breast cancer of upper-outer quadrant of left female breast 09/24/2014  . History of chemotherapy     finished 02/10/2015    Past Surgical History  Procedure Laterality Date  . Tonsillectomy  1996  . Portacath placement N/A 10/15/2014    Procedure: INSERTION PORT-A-CATH;  Surgeon: Rolm Bookbinder, MD;  Location: [facility];  Service: General;  Laterality: N/A;  . Radioactive seed guided mastectomy with axillary sentinel lymph node biopsy Left 03/06/2015    Procedure: RADIOACTIVE SEED GUIDED PARTIAL MASTECTOMY WITH AXILLARY SENTINEL LYMPH NODE BIOPSY;  Surgeon: Rolm Bookbinder, MD;  Location: [facility];  Service: General;  Laterality: Left;    There were no vitals filed for this visit.  Visit Diagnosis:  Left shoulder pain  Shoulder stiffness, left      Subjective Assessment - 03/31/15 0806    Subjective "Having soreness in the top of  my hand.  Was in the wrist on Friday, then into the hand."  Feels she has enough with her HEP right now.   Currently in Pain? Yes   Pain Score 2    Pain Location Hand   Pain Orientation Posterior;Left   Pain Descriptors / Indicators Sore   Aggravating Factors  unsure   Pain Relieving Factors unsure   Multiple Pain Sites Yes            [facility] PT Assessment - 03/31/15 0001    AROM   Left Shoulder Flexion 134 Degrees  after stretching   Left Shoulder ABduction 110 Degrees                   [facility] Adult PT Treatment/Exercise - 03/31/15 0001    Shoulder Exercises: Supine   Other Supine Exercises over towel roll, active horizontal abduction x 10, "V" shape active motions x 10   Other Supine Exercises hooklying with arms in 90 degrees abduction, lower trunk rotation with knees right to stretch left upper chest   Shoulder Exercises: Pulleys   Flexion 2 minutes   ABduction 2 minutes   Shoulder Exercises: Therapy Ball   Flexion 10 reps   ABduction 5 reps   Manual Therapy   Manual Therapy Other (comment)   Myofascial Release left UE myofascial pulling in supine and in right sidelying;    Passive ROM Left shoulder stretch to tolerance in abduction and flexion; left UE neural tension stretches; gentle massage (stroking) of cording at left axilla and anterior elbow while left arm is in abduction; left shoulder horizontal abduction stretch.  Palpable popping felt with stroking of cording during abduction stretch and pulling stretch.   Other Manual Therapy left UE neural tension stretch  Short Term Clinic Goals - 03/19/15 1220    CC Short Term Goal  #1   Title short term goals= long term goals             Long Term Clinic Goals - 03/27/15 0854    CC Long Term Goal  #2   Title pt will have left shoulder abduction of  120 degrees so that she can receive radiation treatmet  Progress noted, 91 degrees 03/27/15   Status On-going   CC Long Term Goal  #3   Title pt will report a decrease in pain by 50% so she can perform daily tasks with greater ease    Status Achieved   CC Long Term Goal  #4   Title pt will decrease DASH score to 20 demonstrating functional improvement of arm    Status On-going            Plan - 03/31/15 0844    Clinical Impression Statement  Cording still visible and palpable, and with palpable releases with popping today (when cording was massaged during stretching); left shoulder AROM increased considerably.   Pt will benefit from skilled therapeutic intervention in order to improve on the following deficits Decreased strength;Pain;Decreased knowledge of use of DME;Increased edema;Decreased range of motion   Rehab Potential Excellent   PT Frequency 3x / week   PT Duration 4 weeks   PT Treatment/Interventions Therapeutic exercise;Passive range of motion;Manual techniques   PT Next Visit Plan Continue manual techniques for ROM and release of cording, neural tension.  Manual lymph drainage prn.  Progress HEP prn.        Problem List Patient Active Problem List   Diagnosis Date Noted  . Chemotherapy induced neutropenia 02/03/2015  . Bronchitis 01/23/2015  . 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 Constipation 12/09/2014  . Hot flashes 12/09/2014  . Spotting 11/20/2014  . Vasovagal near syncope 11/16/2014  . Breast cancer of upper-outer quadrant of left female breast 09/24/2014    Joshoa Shawler 03/31/2015, 8:47 AM  [place], [place], [address] Phone: [phone]   Fax:  [place], PT 03/31/2015 8:47 AM

## 2015-03-31 NOTE — Telephone Encounter (Signed)
per pt req to chge time for inj on 4/21-pt has updated time

## 2015-04-01 ENCOUNTER — Ambulatory Visit: Payer: 59

## 2015-04-01 DIAGNOSIS — M25612 Stiffness of left shoulder, not elsewhere classified: Secondary | ICD-10-CM | POA: Diagnosis not present

## 2015-04-01 DIAGNOSIS — M25512 Pain in left shoulder: Secondary | ICD-10-CM

## 2015-04-01 NOTE — Therapy (Signed)
[place], [place], [address] Phone: [phone]   Fax:  [phone]  Physical Therapy Treatment  Patient Details  Name: Nancy Austin MRN: [id number] Date of Birth: 1976/01/25 Referring Provider:  Paula Compton, MD  Encounter Date: 04/01/2015      PT End of Session - 04/01/15 0855    Visit Number 6   Number of Visits 12   Date for PT Re-Evaluation 04/18/15   PT Start Time 0852   PT Stop Time 0934   PT Time Calculation (min) 42 min      Past Medical History  Diagnosis Date  . Breast cancer of upper-outer quadrant of left female breast 09/24/2014  . History of chemotherapy     finished 02/10/2015    Past Surgical History  Procedure Laterality Date  . Tonsillectomy  1996  . Portacath placement N/A 10/15/2014    Procedure: INSERTION PORT-A-CATH;  Surgeon: Rolm Bookbinder, MD;  Location: [facility];  Service: General;  Laterality: N/A;  . Radioactive seed guided mastectomy with axillary sentinel lymph node biopsy Left 03/06/2015    Procedure: RADIOACTIVE SEED GUIDED PARTIAL MASTECTOMY WITH AXILLARY SENTINEL LYMPH NODE BIOPSY;  Surgeon: Rolm Bookbinder, MD;  Location: [facility];  Service: General;  Laterality: Left;    There were no vitals filed for this visit.  Visit Diagnosis:  Left shoulder pain  Shoulder stiffness, left      Subjective Assessment - 04/01/15 0854    Subjective Still pretty sore from yesterday when I felt like a rubber band snapped in my upper arm, but I do feel like my motion is even better since then.   Currently in Pain? No/denies                         [facility] Adult PT Treatment/Exercise - 04/01/15 0001    Manual Therapy   Myofascial Release left UE myofascial pulling in supine and in right sidelying;    Passive ROM Left shoulder stretch to tolerance in abduction and flexion; left UE neural tension stretches; gentle massage  (stroking) of cording at left axilla and anterior elbow while left arm is in abduction; left shoulder horizontal abduction stretch.    Other Manual Therapy left UE neural tension stretch                   Short Term Clinic Goals - 03/19/15 1220    CC Short Term Goal  #1   Title short term goals= long term goals             Long Term Clinic Goals - 03/27/15 0854    CC Long Term Goal  #2   Title pt will have left shoulder abduction of  120 degrees so that she can receive radiation treatmet  Progress noted, 91 degrees 03/27/15   Status On-going   CC Long Term Goal  #3   Title pt will report a decrease in pain by 50% so she can perform daily tasks with greater ease    Status Achieved   CC Long Term Goal  #4   Title pt will decrease DASH score to 20 demonstrating functional improvement of arm    Status On-going            Plan - 04/01/15 0856    Clinical Impression Statement Pts ROM continues to improve and she reports noticing improvement with ADLs as well.    Pt will benefit from  skilled therapeutic intervention in order to improve on the following deficits Decreased strength;Pain;Decreased knowledge of use of DME;Increased edema;Decreased range of motion   Rehab Potential Excellent   Clinical Impairments Affecting Rehab Potential previous chemotherapy   PT Frequency 3x / week   PT Duration 4 weeks   PT Treatment/Interventions Therapeutic exercise;Passive range of motion;Manual techniques   PT Next Visit Plan Continue manual techniques for ROM and release of cording, neural tension.  Manual lymph drainage prn.  Progress HEP prn.   Consulted and Agree with Plan of Care Patient        Problem List Patient Active Problem List   Diagnosis Date Noted  . Chemotherapy induced neutropenia 02/03/2015  . Bronchitis 01/23/2015  . 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 Constipation 12/09/2014  . Hot flashes 12/09/2014  . Spotting 11/20/2014  .  Vasovagal near syncope 11/16/2014  . Breast cancer of upper-outer quadrant of left female breast 09/24/2014    Golda Acre 04/01/2015, 9:36 AM  [place], [place], [address] Phone: [phone]   Fax:  [phone]

## 2015-04-03 ENCOUNTER — Ambulatory Visit: Payer: 59

## 2015-04-03 ENCOUNTER — Ambulatory Visit: Payer: 59 | Admitting: Physical Therapy

## 2015-04-03 ENCOUNTER — Ambulatory Visit (HOSPITAL_BASED_OUTPATIENT_CLINIC_OR_DEPARTMENT_OTHER): Payer: 59

## 2015-04-03 VITALS — BP 106/74 | HR 61 | Temp 98.3°F

## 2015-04-03 DIAGNOSIS — C50812 Malignant neoplasm of overlapping sites of left female breast: Secondary | ICD-10-CM

## 2015-04-03 DIAGNOSIS — Z5111 Encounter for antineoplastic chemotherapy: Secondary | ICD-10-CM

## 2015-04-03 DIAGNOSIS — M25612 Stiffness of left shoulder, not elsewhere classified: Secondary | ICD-10-CM | POA: Diagnosis not present

## 2015-04-03 DIAGNOSIS — M25512 Pain in left shoulder: Secondary | ICD-10-CM

## 2015-04-03 DIAGNOSIS — C773 Secondary and unspecified malignant neoplasm of axilla and upper limb lymph nodes: Secondary | ICD-10-CM | POA: Diagnosis not present

## 2015-04-03 DIAGNOSIS — C50412 Malignant neoplasm of upper-outer quadrant of left female breast: Secondary | ICD-10-CM

## 2015-04-03 MED ORDER — GOSERELIN ACETATE 3.6 MG ~~LOC~~ IMPL
3.6000 mg | DRUG_IMPLANT | SUBCUTANEOUS | Status: DC
  Administered 2015-04-03: 3.6 mg via SUBCUTANEOUS
  Filled 2015-04-03: qty 3.6

## 2015-04-03 NOTE — Patient Instructions (Signed)
Goserelin injection What is this medicine? GOSERELIN (GOE se rel in) is similar to a hormone found in the body. It lowers the amount of sex hormones that the body makes. Men will have lower testosterone levels and women will have lower estrogen levels while taking this medicine. In men, this medicine is used to treat prostate cancer; the injection is either given once per month or once every 12 weeks. A once per month injection (only) is used to treat women with endometriosis, dysfunctional uterine bleeding, or advanced breast cancer. This medicine may be used for other purposes; ask your health care provider or pharmacist if you have questions. COMMON BRAND NAME(S): Zoladex What should I tell my health care provider before I take this medicine? They need to know if you have any of these conditions (some only apply to women): -diabetes -heart disease or previous heart attack -high blood pressure -high cholesterol -kidney disease -osteoporosis or low bone density -problems passing urine -spinal cord injury -stroke -tobacco smoker -an unusual or allergic reaction to goserelin, hormone therapy, other medicines, foods, dyes, or preservatives -pregnant or trying to get pregnant -breast-feeding How should I use this medicine? This medicine is for injection under the skin. It is given by a health care professional in a hospital or clinic setting. Men receive this injection once every 4 weeks or once every 12 weeks. Women will only receive the once every 4 weeks injection. Talk to your pediatrician regarding the use of this medicine in children. Special care may be needed. Overdosage: If you think you have taken too much of this medicine contact a poison control center or emergency room at once. NOTE: This medicine is only for you. Do not share this medicine with others. What if I miss a dose? It is important not to miss your dose. Call your doctor or health care professional if you are unable to  keep an appointment. What may interact with this medicine? -female hormones like estrogen -herbal or dietary supplements like black cohosh, chasteberry, or DHEA -female hormones like testosterone -prasterone This list may not describe all possible interactions. Give your health care provider a list of all the medicines, herbs, non-prescription drugs, or dietary supplements you use. Also tell them if you smoke, drink alcohol, or use illegal drugs. Some items may interact with your medicine. What should I watch for while using this medicine? Visit your doctor or health care professional for regular checks on your progress. Your symptoms may appear to get worse during the first weeks of this therapy. Tell your doctor or healthcare professional if your symptoms do not start to get better or if they get worse after this time. Your bones may get weaker if you take this medicine for a long time. If you smoke or frequently drink alcohol you may increase your risk of bone loss. A family history of osteoporosis, chronic use of drugs for seizures (convulsions), or corticosteroids can also increase your risk of bone loss. Talk to your doctor about how to keep your bones strong. This medicine should stop regular monthly menstration in women. Tell your doctor if you continue to menstrate. Women should not become pregnant while taking this medicine or for 12 weeks after stopping this medicine. Women should inform their doctor if they wish to become pregnant or think they might be pregnant. There is a potential for serious side effects to an unborn child. Talk to your health care professional or pharmacist for more information. Do not breast-feed an infant while taking   this medicine. Men should inform their doctors if they wish to father a child. This medicine may lower sperm counts. Talk to your health care professional or pharmacist for more information. What side effects may I notice from receiving this  medicine? Side effects that you should report to your doctor or health care professional as soon as possible: -allergic reactions like skin rash, itching or hives, swelling of the face, lips, or tongue -bone pain -breathing problems -changes in vision -chest pain -feeling faint or lightheaded, falls -fever, chills -pain, swelling, warmth in the leg -pain, tingling, numbness in the hands or feet -signs and symptoms of low blood pressure like dizziness; feeling faint or lightheaded, falls; unusually weak or tired -stomach pain -swelling of the ankles, feet, hands -trouble passing urine or change in the amount of urine -unusually high or low blood pressure -unusually weak or tired Side effects that usually do not require medical attention (report to your doctor or health care professional if they continue or are bothersome): -change in sex drive or performance -changes in breast size in both males and females -changes in emotions or moods -headache -hot flashes -irritation at site where injected -loss of appetite -skin problems like acne, dry skin -vaginal dryness This list may not describe all possible side effects. Call your doctor for medical advice about side effects. You may report side effects to FDA at 1-800-FDA-1088. Where should I keep my medicine? This drug is given in a hospital or clinic and will not be stored at home. NOTE: This sheet is a summary. It may not cover all possible information. If you have questions about this medicine, talk to your doctor, pharmacist, or health care provider.  2015, [employer]vier/Gold Standard. (2014-02-05 11:10:35)  

## 2015-04-03 NOTE — Therapy (Signed)
[place], [place], [address] Phone: [phone]   Fax:  [phone]  Physical Therapy Treatment  Patient Details  Name: Nancy Austin MRN: [id number] Date of Birth: 1976/05/25 Referring Provider:  Paula Compton, MD  Encounter Date: 04/03/2015      PT End of Session - 04/03/15 1703    Visit Number 7   Number of Visits 12   Date for PT Re-Evaluation 04/18/15   PT Start Time 5852   PT Stop Time 7782   PT Time Calculation (min) 41 min      Past Medical History  Diagnosis Date  . Breast cancer of upper-outer quadrant of left female breast 09/24/2014  . History of chemotherapy     finished 02/10/2015    Past Surgical History  Procedure Laterality Date  . Tonsillectomy  1996  . Portacath placement N/A 10/15/2014    Procedure: INSERTION PORT-A-CATH;  Surgeon: Rolm Bookbinder, MD;  Location: [facility];  Service: General;  Laterality: N/A;  . Radioactive seed guided mastectomy with axillary sentinel lymph node biopsy Left 03/06/2015    Procedure: RADIOACTIVE SEED GUIDED PARTIAL MASTECTOMY WITH AXILLARY SENTINEL LYMPH NODE BIOPSY;  Surgeon: Rolm Bookbinder, MD;  Location: [facility];  Service: General;  Laterality: Left;    There were no vitals filed for this visit.  Visit Diagnosis:  Left shoulder pain  Shoulder stiffness, left      Subjective Assessment - 04/03/15 1612    Subjective "Good' she starts radition sometime around May 1. Going for simulation next week    Currently in Pain? No/denies                         [facility] Adult PT Treatment/Exercise - 04/03/15 0001    Shoulder Exercises: Supine   Other Supine Exercises diagonal AROM with stretch    Other Supine Exercises forearm pronation and supination   Shoulder Exercises: Pulleys   Flexion 2 minutes   ABduction 2 minutes   Manual Therapy   Myofascial Release to areas of cording in left axilla   Manual Lymphatic Drainage (MLD) In Supine: Rt axilla and Lt inguinal nodes, anterior inter-axillary and Lt axillo-inguinal anastomosis and Lt UE from dorsal hand to lateral shoulder.    Other Manual Therapy left UE neural tension stretch                    Short Term Clinic Goals - 03/19/15 1220    CC Short Term Goal  #1   Title short term goals= long term goals             Long Term Clinic Goals - 04/03/15 1708    CC Long Term Goal  #1   Title pt will verbalize knowledge of lymphedem risk reduction pratices.   Status On-going   CC Long Term Goal  #2   Title pt will have left shoulder abduction of  120 degrees so that she can receive radiation treatmet   Status On-going   CC Long Term Goal  #3   Title pt will report a decrease in pain by 50% so she can perform daily tasks with greater ease    Status On-going   CC Long Term Goal  #4   Title pt will decrease DASH score to 20 demonstrating functional improvement of arm    Status On-going            Plan - 04/03/15 1704  Clinical Impression Statement pt appears to have some fullness in left foream, antecubital fossa.  Proveded small tg soft to offer lymphatic support.  Pt to take it off later to see if arm appears better and/or if it has an effect on the cording  Pt interested in strength training   PT Next Visit Plan Continue manual techniques for ROM and release of cording, neural tension.  Manual lymph drainage prn.  Progress HEP prn. begin strenth ABC training.  send prescription for off the shelf sleeve and gauntlet for porphylaxis  Remeasure arm        Problem List Patient Active Problem List   Diagnosis Date Noted  . Chemotherapy induced neutropenia 02/03/2015  . Bronchitis 01/23/2015  . 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 Constipation 12/09/2014  . Hot flashes 12/09/2014  . Spotting 11/20/2014  . Vasovagal near syncope 11/16/2014  . Breast cancer of upper-outer quadrant of left  female breast 09/24/2014   Donato Heinz. Owens Shark, PT  04/03/2015, 5:10 PM  [place], [place], [address] Phone: [phone]   Fax:  [phone]

## 2015-04-08 ENCOUNTER — Ambulatory Visit
Admission: RE | Admit: 2015-04-08 | Discharge: 2015-04-08 | Disposition: A | Discharge status: Home / Self Care | Payer: 59 | Source: Ambulatory Visit | Attending: Radiation Oncology | Admitting: Radiation Oncology

## 2015-04-08 ENCOUNTER — Ambulatory Visit: Payer: 59

## 2015-04-08 DIAGNOSIS — M25612 Stiffness of left shoulder, not elsewhere classified: Secondary | ICD-10-CM

## 2015-04-08 DIAGNOSIS — M25512 Pain in left shoulder: Secondary | ICD-10-CM

## 2015-04-08 DIAGNOSIS — Z510 Encounter for antineoplastic radiation therapy: Secondary | ICD-10-CM | POA: Diagnosis not present

## 2015-04-08 DIAGNOSIS — C50412 Malignant neoplasm of upper-outer quadrant of left female breast: Secondary | ICD-10-CM

## 2015-04-08 NOTE — Therapy (Signed)
[place], [place], [address] Phone: [phone]   Fax:  [phone]  Physical Therapy Treatment  Patient Details  Name: Nancy Austin MRN: [id number] Date of Birth: 1976/05/10 Referring Provider:  Paula Compton, MD  Encounter Date: 04/08/2015      PT End of Session - 04/08/15 0928    Visit Number 8   Number of Visits 12   Date for PT Re-Evaluation 04/18/15   PT Start Time 0851   PT Stop Time 0933   PT Time Calculation (min) 42 min      Past Medical History  Diagnosis Date  . Breast cancer of upper-outer quadrant of left female breast 09/24/2014  . History of chemotherapy     finished 02/10/2015    Past Surgical History  Procedure Laterality Date  . Tonsillectomy  1996  . Portacath placement N/A 10/15/2014    Procedure: INSERTION PORT-A-CATH;  Surgeon: Rolm Bookbinder, MD;  Location: [facility];  Service: General;  Laterality: N/A;  . Radioactive seed guided mastectomy with axillary sentinel lymph node biopsy Left 03/06/2015    Procedure: RADIOACTIVE SEED GUIDED PARTIAL MASTECTOMY WITH AXILLARY SENTINEL LYMPH NODE BIOPSY;  Surgeon: Rolm Bookbinder, MD;  Location: [facility];  Service: General;  Laterality: Left;    There were no vitals filed for this visit.  Visit Diagnosis:  Left shoulder pain  Shoulder stiffness, left      Subjective Assessment - 04/08/15 0857    Subjective Feel like my forearm is a little swollen, and my hand feels bruised. We went hiking this weekend and I wore the sleeve most of the weekend she gave me last visit.                LYMPHEDEMA/ONCOLOGY QUESTIONNAIRE - 04/08/15 0854    Left Upper Extremity Lymphedema   10 cm Proximal to Olecranon Process 26.2 cm   Olecranon Process 23.7 cm   10 cm Proximal to Ulnar Styloid Process 20.9 cm   Just Proximal to Ulnar Styloid Process 15.7 cm   Across Hand at [employer] 18.4 cm   At [place] of  2nd Digit 5.8 cm                  [facility] Adult PT Treatment/Exercise - 04/08/15 0001    Shoulder Exercises: Standing   Other Standing Exercises Stretches from Strength ABC Program: Bil chest, shoulder, tricep, quadricep, seated hamstring and pirirformis figure 4 stretch and supine low trunk rotation; all 1 second holds for 1 rep   Other Standing Exercises Strength from Strength ABC Program: Bridging, Bil S/L clams, and quadruped alternate UE/LE all 10 reps each. Pt returned very good demo.   Manual Therapy   Myofascial Release to areas of cording in left axilla   Manual Lymphatic Drainage (MLD) In Supine: Rt axilla and Lt inguinal nodes, anterior inter-axillary and Lt axillo-inguinal anastomosis and Lt UE from dorsal hand to lateral shoulder.    Other Manual Therapy left UE neural tension stretch                 PT Education - 04/08/15 0927    Education provided Yes   Education Details Began instruction of Strength ABC Program, and issued handout for self manual lymph drainage   Person(s) Educated Patient   Methods Explanation;Demonstration;Verbal cues   Comprehension Verbalized understanding;Returned demonstration           Short Term Clinic Goals - 03/19/15 1220  CC Short Term Goal  #1   Title short term goals= long term goals             Long Term Clinic Goals - 04/03/15 1708    CC Long Term Goal  #1   Title pt will verbalize knowledge of lymphedem risk reduction pratices.   Status On-going   CC Long Term Goal  #2   Title pt will have left shoulder abduction of  120 degrees so that she can receive radiation treatmet   Status On-going   CC Long Term Goal  #3   Title pt will report a decrease in pain by 50% so she can perform daily tasks with greater ease    Status On-going   CC Long Term Goal  #4   Title pt will decrease DASH score to 20 demonstrating functional improvement of arm    Status On-going            Plan - 04/08/15 0934     Clinical Impression Statement Pt did have some fullness at Lt forearm today and circumference measurements were 0.5 cm increased. Performed self manual lymph drainage reviewing with pt throughout and issued handout as well with sequence. Alos began instruction of Strength ABC Program, pt has good understanding of technique as she used to work out before diagnosis.   Pt will benefit from skilled therapeutic intervention in order to improve on the following deficits Decreased strength;Pain;Decreased knowledge of use of DME;Increased edema;Decreased range of motion   Rehab Potential Excellent   Clinical Impairments Affecting Rehab Potential previous chemotherapy   PT Frequency 3x / week   PT Duration 4 weeks   PT Treatment/Interventions Therapeutic exercise;Passive range of motion;Manual techniques   PT Next Visit Plan Continue manual techniques for ROM and release of cording, neural tension.  Manual lymph drainage prn. Continue strenth ABC training with strength portion.   Recommended Other Services Sent script for compression sleeve/gauntlet to Dr. Jacklynn Ganong and Agree with Plan of Care Patient        Problem List Patient Active Problem List   Diagnosis Date Noted  . Chemotherapy induced neutropenia 02/03/2015  . Bronchitis 01/23/2015  . 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 Constipation 12/09/2014  . Hot flashes 12/09/2014  . Spotting 11/20/2014  . Vasovagal near syncope 11/16/2014  . Breast cancer of upper-outer quadrant of left female breast 09/24/2014    Otelia Limes, PTA 04/08/2015, 9:43 AM  [place], [place], [address] Phone: [phone]   Fax:  [phone]

## 2015-04-08 NOTE — Patient Instructions (Signed)

## 2015-04-09 ENCOUNTER — Ambulatory Visit: Payer: 59

## 2015-04-09 DIAGNOSIS — M25512 Pain in left shoulder: Secondary | ICD-10-CM

## 2015-04-09 DIAGNOSIS — M25612 Stiffness of left shoulder, not elsewhere classified: Secondary | ICD-10-CM | POA: Diagnosis not present

## 2015-04-09 NOTE — Therapy (Signed)
[place], [place], [address] Phone: [phone]   Fax:  [phone]  Physical Therapy Treatment  Patient Details  Name: Nancy Austin MRN: [id number] Date of Birth: 11/25/1976 Referring Provider:  Paula Compton, MD  Encounter Date: 04/09/2015      PT End of Session - 04/09/15 1023    PT Start Time 0936   PT Stop Time 1022   PT Time Calculation (min) 46 min      Past Medical History  Diagnosis Date  . Breast cancer of upper-outer quadrant of left female breast 09/24/2014  . History of chemotherapy     finished 02/10/2015    Past Surgical History  Procedure Laterality Date  . Tonsillectomy  1996  . Portacath placement N/A 10/15/2014    Procedure: INSERTION PORT-A-CATH;  Surgeon: Rolm Bookbinder, MD;  Location: [facility];  Service: General;  Laterality: N/A;  . Radioactive seed guided mastectomy with axillary sentinel lymph node biopsy Left 03/06/2015    Procedure: RADIOACTIVE SEED GUIDED PARTIAL MASTECTOMY WITH AXILLARY SENTINEL LYMPH NODE BIOPSY;  Surgeon: Rolm Bookbinder, MD;  Location: [facility];  Service: General;  Laterality: Left;    There were no vitals filed for this visit.  Visit Diagnosis:  Left shoulder pain  Shoulder stiffness, left      Subjective Assessment - 04/09/15 0940    Subjective My forearm feels better today, not as tender.    Currently in Pain? No/denies            [facility] PT Assessment - 04/09/15 0001    AROM   Left Shoulder Flexion 143 Degrees   Left Shoulder ABduction 145 Degrees           LYMPHEDEMA/ONCOLOGY QUESTIONNAIRE - 04/08/15 0854    Left Upper Extremity Lymphedema   10 cm Proximal to Olecranon Process 26.2 cm   Olecranon Process 23.7 cm   10 cm Proximal to Ulnar Styloid Process 20.9 cm   Just Proximal to Ulnar Styloid Process 15.7 cm   Across Hand at [employer] 18.4 cm   At [place] of 2nd Digit 5.8 cm            Quick Dash - 04/09/15 0001    Open a tight or new jar Mild difficulty   Do heavy household chores (wash walls, wash floors) Mild difficulty   Carry a shopping bag or briefcase No difficulty   Wash your back Mild difficulty   Use a knife to cut food No difficulty   Recreational activities in which you take some force or impact through your arm, shoulder, or hand (golf, hammering, tennis) Mild difficulty   During the past week, to what extent has your arm, shoulder or hand problem interfered with your normal social activities with family, friends, neighbors, or groups? Not at all   During the past week, to what extent has your arm, shoulder or hand problem limited your work or other regular daily activities Slightly   Arm, shoulder, or hand pain. Mild   Tingling (pins and needles) in your arm, shoulder, or hand None   Difficulty Sleeping Mild difficulty   DASH Score 15.91 %               [facility] Adult PT Treatment/Exercise - 04/09/15 0001    Shoulder Exercises: Standing   Other Standing Exercises Strength from Strength ABC Packet: Supine chest press 3 lbs, squats with 2 lbs each hand, standing row with chair 3 lbs, bil  hip abduction, scaption against wall 2 lbs, 8" step ups, tricep kickbacks 3 lbs, calf raises holding 3 lbs, and bicep curls 3 lbs each all bil, 10 reps each. Pt returned good demo.   Manual Therapy   Myofascial Release --   Manual Lymphatic Drainage (MLD) In Supine: Rt axilla and Lt inguinal nodes, anterior inter-axillary and Lt axillo-inguinal anastomosis and Lt UE from dorsal hand to lateral shoulder.    Other Manual Therapy --                PT Education - 04/08/15 0927    Education provided Yes   Education Details Began instruction of Strength ABC Program, and issued handout for self manual lymph drainage   Person(s) Educated Patient   Methods Explanation;Demonstration;Verbal cues   Comprehension Verbalized understanding;Returned demonstration            Short Term Clinic Goals - 03/19/15 1220    CC Short Term Goal  #1   Title short term goals= long term goals             Long Term Clinic Goals - 04/09/15 1017    CC Long Term Goal  #1   Title pt will verbalize knowledge of lymphedem risk reduction pratices.   Status Achieved   CC Long Term Goal  #2   Title pt will have left shoulder abduction of  120 degrees so that she can receive radiation treatmet  145 degrees today and was able to tolerate radaition positioning for simulation yesterday    Status Achieved   CC Long Term Goal  #3   Title pt will report a decrease in pain by 50% so she can perform daily tasks with greater ease   90% improvement reported   Status Achieved   CC Long Term Goal  #4   Title pt will decrease DASH score to 20 demonstrating functional improvement of arm   Pt with 15% DASH score today   Status Achieved            Plan - 04/09/15 1255    Clinical Impression Statement Pts forearm was visibly reduced today and pt reports that she could tell improvement as well, her supination/pronation didnt feel restricted like it did yesterday when forearm was fuller. Pt will be starting radiation next week and would like to be seen possibly one more time in 2 weeks to asses her ROM during radiaiton and circumference.    Pt will benefit from skilled therapeutic intervention in order to improve on the following deficits Decreased strength;Pain;Decreased knowledge of use of DME;Increased edema;Decreased range of motion   Rehab Potential Excellent   Clinical Impairments Affecting Rehab Potential previous chemotherapy   PT Frequency 3x / week   PT Duration 4 weeks   PT Treatment/Interventions Therapeutic exercise;Passive range of motion;Manual techniques   PT Next Visit Plan Pt has appointment week of May 9 to assess ROM and circumference as she will have started radiation by then, and possible discharge that day as she has progressed very well with her goals.     Consulted and Agree with Plan of Care Patient        Problem List Patient Active Problem List   Diagnosis Date Noted  . Chemotherapy induced neutropenia 02/03/2015  . Bronchitis 01/23/2015  . 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 Constipation 12/09/2014  . Hot flashes 12/09/2014  . Spotting 11/20/2014  . Vasovagal near syncope 11/16/2014  . Breast cancer of upper-outer quadrant of left female  breast 09/24/2014    Otelia Limes, PTA 04/09/2015, 1:01 PM  [place], [place], [address] Phone: [phone]   Fax:  [phone]

## 2015-04-11 ENCOUNTER — Ambulatory Visit (HOSPITAL_COMMUNITY): Payer: 59

## 2015-04-11 DIAGNOSIS — Z510 Encounter for antineoplastic radiation therapy: Secondary | ICD-10-CM | POA: Diagnosis not present

## 2015-04-15 ENCOUNTER — Other Ambulatory Visit: Payer: 59

## 2015-04-15 ENCOUNTER — Telehealth: Payer: Self-pay | Admitting: Oncology

## 2015-04-15 ENCOUNTER — Ambulatory Visit: Payer: 59 | Admitting: Nurse Practitioner

## 2015-04-15 ENCOUNTER — Ambulatory Visit (HOSPITAL_BASED_OUTPATIENT_CLINIC_OR_DEPARTMENT_OTHER): Payer: 59

## 2015-04-15 ENCOUNTER — Ambulatory Visit
Admission: RE | Admit: 2015-04-15 | Discharge: 2015-04-15 | Disposition: A | Discharge status: Home / Self Care | Payer: 59 | Source: Ambulatory Visit | Attending: Radiation Oncology | Admitting: Radiation Oncology

## 2015-04-15 ENCOUNTER — Ambulatory Visit (HOSPITAL_BASED_OUTPATIENT_CLINIC_OR_DEPARTMENT_OTHER): Payer: 59 | Admitting: Oncology

## 2015-04-15 ENCOUNTER — Other Ambulatory Visit (HOSPITAL_BASED_OUTPATIENT_CLINIC_OR_DEPARTMENT_OTHER): Payer: 59

## 2015-04-15 VITALS — BP 111/81 | HR 62 | Temp 98.2°F | Resp 18 | Ht 66.0 in | Wt 142.4 lb

## 2015-04-15 DIAGNOSIS — C773 Secondary and unspecified malignant neoplasm of axilla and upper limb lymph nodes: Secondary | ICD-10-CM

## 2015-04-15 DIAGNOSIS — Z5112 Encounter for antineoplastic immunotherapy: Secondary | ICD-10-CM

## 2015-04-15 DIAGNOSIS — C50812 Malignant neoplasm of overlapping sites of left female breast: Secondary | ICD-10-CM

## 2015-04-15 DIAGNOSIS — Z510 Encounter for antineoplastic radiation therapy: Secondary | ICD-10-CM | POA: Diagnosis not present

## 2015-04-15 DIAGNOSIS — C50412 Malignant neoplasm of upper-outer quadrant of left female breast: Secondary | ICD-10-CM

## 2015-04-15 LAB — CBC WITH DIFFERENTIAL/PLATELET
BASO%: 0.8 % (ref 0.0–2.0)
Basophils Absolute: 0 10*3/uL (ref 0.0–0.1)
EOS ABS: 0 10*3/uL (ref 0.0–0.5)
EOS%: 1.7 % (ref 0.0–7.0)
HCT: 39 % (ref 34.8–46.6)
HGB: 12.5 g/dL (ref 11.6–15.9)
LYMPH#: 1.1 10*3/uL (ref 0.9–3.3)
LYMPH%: 42.3 % (ref 14.0–49.7)
MCH: 29.4 pg (ref 25.1–34.0)
MCHC: 32.2 g/dL (ref 31.5–36.0)
MCV: 91.3 fL (ref 79.5–101.0)
MONO#: 0.2 10*3/uL (ref 0.1–0.9)
MONO%: 6.9 % (ref 0.0–14.0)
NEUT#: 1.3 10*3/uL — ABNORMAL LOW (ref 1.5–6.5)
NEUT%: 48.3 % (ref 38.4–76.8)
Platelets: 218 10*3/uL (ref 145–400)
RBC: 4.27 10*6/uL (ref 3.70–5.45)
RDW: 12.9 % (ref 11.2–14.5)
WBC: 2.6 10*3/uL — ABNORMAL LOW (ref 3.9–10.3)

## 2015-04-15 LAB — COMPREHENSIVE METABOLIC PANEL (CC13)
ALK PHOS: 59 U/L (ref 40–150)
ALT: 17 U/L (ref 0–55)
AST: 20 U/L (ref 5–34)
Albumin: 3.9 g/dL (ref 3.5–5.0)
Anion Gap: 9 mEq/L (ref 3–11)
BILIRUBIN TOTAL: 0.34 mg/dL (ref 0.20–1.20)
BUN: 8.4 mg/dL (ref 7.0–26.0)
CO2: 25 mEq/L (ref 22–29)
CREATININE: 0.7 mg/dL (ref 0.6–1.1)
Calcium: 8.9 mg/dL (ref 8.4–10.4)
Chloride: 106 mEq/L (ref 98–109)
EGFR: >90 mL/min/{1.73_m2} (ref >90)
Glucose: 94 mg/dl (ref 70–140)
Potassium: 4 mEq/L (ref 3.5–5.1)
Sodium: 140 mEq/L (ref 136–145)
Total Protein: 6.3 g/dL — ABNORMAL LOW (ref 6.4–8.3)

## 2015-04-15 MED ORDER — DIPHENHYDRAMINE HCL 25 MG PO CAPS
ORAL_CAPSULE | ORAL | Status: AC
  Filled 2015-04-15: qty 1

## 2015-04-15 MED ORDER — HEPARIN SOD (PORK) LOCK FLUSH 100 UNIT/ML IV SOLN
500.0000 [IU] | Freq: Once | INTRAVENOUS | Status: AC | PRN
  Administered 2015-04-15: 500 [IU]
  Filled 2015-04-15: qty 5

## 2015-04-15 MED ORDER — SODIUM CHLORIDE 0.9 % IJ SOLN
10.0000 mL | INTRAMUSCULAR | Status: DC | PRN
  Administered 2015-04-15: 10 mL
  Filled 2015-04-15: qty 10

## 2015-04-15 MED ORDER — DOXYCYCLINE HYCLATE 100 MG PO CAPS
100.0000 mg | ORAL_CAPSULE | Freq: Two times a day (BID) | ORAL | Status: DC
Start: 2015-04-15 — End: 2015-05-27

## 2015-04-15 MED ORDER — DIPHENHYDRAMINE HCL 25 MG PO CAPS
25.0000 mg | ORAL_CAPSULE | Freq: Once | ORAL | Status: AC
  Administered 2015-04-15: 25 mg via ORAL

## 2015-04-15 MED ORDER — ACETAMINOPHEN 325 MG PO TABS
ORAL_TABLET | ORAL | Status: AC
  Filled 2015-04-15: qty 2

## 2015-04-15 MED ORDER — SODIUM CHLORIDE 0.9 % IV SOLN
Freq: Once | INTRAVENOUS | Status: AC
  Administered 2015-04-15: 10:00:00 via INTRAVENOUS

## 2015-04-15 MED ORDER — SODIUM CHLORIDE 0.9 % IV SOLN
6.0000 mg/kg | Freq: Once | INTRAVENOUS | Status: AC
  Administered 2015-04-15: 399 mg via INTRAVENOUS
  Filled 2015-04-15: qty 19

## 2015-04-15 MED ORDER — ACETAMINOPHEN 325 MG PO TABS
650.0000 mg | ORAL_TABLET | Freq: Once | ORAL | Status: AC
  Administered 2015-04-15: 650 mg via ORAL

## 2015-04-15 NOTE — Patient Instructions (Signed)
[facility] Discharge Instructions for Patients Receiving Chemotherapy  Today you received the following chemotherapy agents:  Herceptin  To help prevent nausea and vomiting after your treatment, we encourage you to take your nausea medication as prescribed.   If you develop nausea and vomiting that is not controlled by your nausea medication, call the clinic.   BELOW ARE SYMPTOMS THAT SHOULD BE REPORTED IMMEDIATELY:  *FEVER GREATER THAN 100.5 F  *CHILLS WITH OR WITHOUT FEVER  NAUSEA AND VOMITING THAT IS NOT CONTROLLED WITH YOUR NAUSEA MEDICATION  *UNUSUAL SHORTNESS OF BREATH  *UNUSUAL BRUISING OR BLEEDING  TENDERNESS IN MOUTH AND THROAT WITH OR WITHOUT PRESENCE OF ULCERS  *URINARY PROBLEMS  *BOWEL PROBLEMS  UNUSUAL RASH Items with * indicate a potential emergency and should be followed up as soon as possible.  Feel free to call the clinic you have any questions or concerns. The clinic phone number is [phone].  Please show the CHEMO ALERT CARD at check-in to the Emergency Department and triage nurse.   

## 2015-04-15 NOTE — Progress Notes (Signed)
[facility]  [phone] [phone]     ID: Nancy Austin DOB: August 18, 1976  MR#: [id number]  IRJ#:[id number]  Patient Care Team: Paula Compton, MD as PCP - General (Obstetrics and Gynecology) Rolm Bookbinder, MD as Consulting Physician (General Surgery) Gery Pray, MD as Consulting Physician (Radiation Oncology) Chauncey Cruel, MD as Consulting Physician (Oncology) OTHER MD:  CHIEF COMPLAINT: Triple positive breast cancer  CURRENT TREATMENT: Adjuvant radiation pending; continuing anti-HER-2 immunotherapy; on goserelin   BREAST CANCER HISTORY: From the original Intake note:  [name] herself found a lump in her left breast early October 2015 and brought it to her gynecologist attention. On 09/20/2014 she was set up for bilateral diagnostic mammography and left breast ultrasonography of the breast Center. This was the patient's first ever mammogram. In the area of concern in the left breast there was suspicious pleomorphic calcifications spanning 1.1 cm. There was no discrete mammographic mass in this dense breasts (category C.). On physical exam, there was a palpable firm at 1.5 cm mass at the 3:00 position in the left breast. By ultrasound this was irregular and hypoechoic and measured 1.8 cm. Ultrasound of the left axilla was unremarkable. Aside from multiple cysts in the left breast there were no other findings of concern.  On the same day, 09/20/2014, the patient underwent biopsy of the left breast palpable mass. This showed ([id number]) and invasive ductal carcinoma, grade 2, estrogen receptor 100% positive, progesterone receptor 89% positive, both with strong staining intensity, with an MIB-1 of 16%. HER-2 was amplified with a signals ratio of 5.07 and a copy number per cell of 6.85. Paragraph on 09/30/2014 the patient underwent bilateral breast MRIs. This showed a 1.7 cm mass in the upper outer quadrant of the left breast. There was no other  suspicious finding in either breast and no abnormal appearing adenopathy.  The patient's subsequent history is as detailed below.  INTERVAL HISTORY: Laverta teturns today for follow-up of her breast cancer accompanied by her mother. After her first O Sanderlin dose she had a period. She has not yet had any hot flashes, vaginal dryness, insomnia, or weight gain problems, but those are likely in the near future. She has had a little bit more acne than usual. She is tolerating the Herceptin with no side effects that she is aware of. She is starting radiation tomorrow. She is also benefiting from physical therapy  REVIEW OF SYSTEMS: A detailed review of systems today was otherwise noncontributory.  PAST MEDICAL HISTORY: Past Medical History  Diagnosis Date  . Breast cancer of upper-outer quadrant of left female breast 09/24/2014  . History of chemotherapy     finished 02/10/2015    PAST SURGICAL HISTORY: Past Surgical History  Procedure Laterality Date  . Tonsillectomy  1996  . Portacath placement N/A 10/15/2014    Procedure: INSERTION PORT-A-CATH;  Surgeon: Rolm Bookbinder, MD;  Location: [facility];  Service: General;  Laterality: N/A;  . Radioactive seed guided mastectomy with axillary sentinel lymph node biopsy Left 03/06/2015    Procedure: RADIOACTIVE SEED GUIDED PARTIAL MASTECTOMY WITH AXILLARY SENTINEL LYMPH NODE BIOPSY;  Surgeon: Rolm Bookbinder, MD;  Location: [facility];  Service: General;  Laterality: Left;    FAMILY HISTORY Family History  Problem Relation Age of Onset  . Skin cancer Father   . Prostate cancer Maternal Uncle 73    currently 39  . Multiple myeloma Paternal Grandmother 62    Deceased 25  The patient's parents are both living.  The patient has one brother, no sisters. One grandmother was diagnosed with multiple myeloma at age 70. There is no history of breast or ovarian cancer in the family.   GYNECOLOGIC HISTORY:  Patient's last menstrual  period was 02/22/2015.  menarche age 30, first live birth age 42. The patient is GX P2. She was still having regular periods at the start of chemotherapy. She was on birth control pills on and off for the last 15 years, stopping in October of 2015.   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 Her husband Rolla Plate works as a Immunologist at [employer]. Their children are Sam age 19 and Terrence Dupont age 59.    ADVANCED DIRECTIVES: In place   HEALTH MAINTENANCE: History  Substance Use Topics  . Smoking status: Never Smoker   . Smokeless tobacco: Never Used  . Alcohol Use: Yes     Comment: 1-3 drinks/week     Colonoscopy:  PAP:  November 2014  Bone density:  Lipid panel:  Allergies  Allergen Reactions  . Morphine And Related Nausea And Vomiting  . Tegaderm Ag Mesh [Silver] Rash    Current Outpatient Prescriptions  Medication Sig Dispense Refill  . gabapentin (NEURONTIN) 300 MG capsule Take 1 capsule (300 mg total) by mouth at bedtime. 30 capsule 0  . Multiple Vitamin (MULTIVITAMIN) tablet Take 1 tablet by mouth daily.     No current facility-administered medications for this visit.    OBJECTIVE: young white woman who appears stated age 39 Vitals:   04/15/15 0914  BP: 111/81  Pulse: 62  Temp: 98.2 F (36.8 C)  Resp: 18     Body mass index is 22.99 kg/(m^2).    ECOG FS:0 - Asymptomatic  Sclerae unicteric, pupils round and equal Oropharynx clear, no thrush or other lesions No cervical or supraclavicular adenopathy Lungs no rales or rhonchi Heart regular rate and rhythm Abd soft, nontender, positive bowel sounds MSK no focal spinal tenderness, minimal left forearm lymphedema Neuro: nonfocal, well oriented, positive affect Breasts: Deferred  LAB RESULTS:  CMP     Component Value Date/Time   NA 140 03/25/2015 0812   NA 144 10/02/2014 1206   K 3.8 03/25/2015 0812   K 3.9 10/02/2014 1206   CL 104 10/02/2014 1206   CO2 28 03/25/2015 0812   CO2  26 10/02/2014 1206   GLUCOSE 92 03/25/2015 0812   GLUCOSE 103* 10/02/2014 1206   BUN 9.7 03/25/2015 0812   BUN 11 10/02/2014 1206   CREATININE 0.7 03/25/2015 0812   CREATININE 0.87 10/02/2014 1206   CALCIUM 8.9 03/25/2015 0812   CALCIUM 9.4 10/02/2014 1206   PROT 6.4 03/25/2015 0812   PROT 7.3 10/02/2014 1206   ALBUMIN 3.8 03/25/2015 0812   ALBUMIN 3.9 10/02/2014 1206   AST 23 03/25/2015 0812   AST 20 10/02/2014 1206   ALT 22 03/25/2015 0812   ALT 16 10/02/2014 1206   ALKPHOS 54 03/25/2015 0812   ALKPHOS 45 10/02/2014 1206   BILITOT 0.79 03/25/2015 0812   BILITOT 0.4 10/02/2014 1206    I No results found for: SPEP  Lab Results  Component Value Date   WBC 2.6* 04/15/2015   NEUTROABS 1.3* 04/15/2015   HGB 12.5 04/15/2015   HCT 39.0 04/15/2015   MCV 91.3 04/15/2015   PLT 218 04/15/2015      Chemistry      Component Value Date/Time   NA 140 03/25/2015 0812   NA 144 10/02/2014 1206  K 3.8 03/25/2015 0812   K 3.9 10/02/2014 1206   CL 104 10/02/2014 1206   CO2 28 03/25/2015 0812   CO2 26 10/02/2014 1206   BUN 9.7 03/25/2015 0812   BUN 11 10/02/2014 1206   CREATININE 0.7 03/25/2015 0812   CREATININE 0.87 10/02/2014 1206      Component Value Date/Time   CALCIUM 8.9 03/25/2015 0812   CALCIUM 9.4 10/02/2014 1206   ALKPHOS 54 03/25/2015 0812   ALKPHOS 45 10/02/2014 1206   AST 23 03/25/2015 0812   AST 20 10/02/2014 1206   ALT 22 03/25/2015 0812   ALT 16 10/02/2014 1206   BILITOT 0.79 03/25/2015 0812   BILITOT 0.4 10/02/2014 1206       No results found for: LABCA2  No components found for: LABCA125  No results for input(s): INR in the last 168 hours.  Urinalysis No results found for: COLORURINE  STUDIES: No results found.  ASSESSMENT: 39 y.o. BRCA negative [place] woman s/p Left breast upper outer quadrant biopsy 09/20/2014, for a clinical T1c N0, stage IA invasive ductal carcinoma, grade 2, estrogen and progesterone receptor positive, HER-2  amplified with a signal is ratio of 5.07, and an MIB-1 of 16%  (1) neoadjuvant treatment consisting of carboplatin, docetaxel, trastuzumab and pertuzumab given every 21 days x6,  completed 02/10/2015  (a) breast MRI 02/11/2015 showed a complete radiologic response  (2) left lumpectomy and sentinel lymph node sampling 03/06/2015 showed a residual ypT1c ypN1a, stage IIA invasive ductal carcinoma, grade 1, with repeat prognostic panel still triple positive, and negative margins  (3) trastuzumab to be continued to total one year (through October 2016)  (a) most recent echo 01/10/2015 shows an EF of 55-60%  (4) adjuvant radiation to follow surgery   (5) antiestrogens to follow radiation  (6)  the BreastNext geneprofile  ([facility] genetics) obtained November 2015 did not reveal a mutation in ATM, BARD1, BRCA1, BRCA BRIP1, CDH1, CHEK2, MRE11A, MUTYH, NBN, NF1, PALB2, PTEN, RAD50, RAD51C, RAD51D, or TP53  PLAN: Erisa has not yet started to experience menopausal symptoms, and in fact the goserelin induced 8.. Of course this is an agonist that turns into an antagonist and we tried to go over that very complicated feedback today. The bottom line is that she does have some acne and I am putting her on doxycycline 100 mg daily for 10 days to take care of that problem. I do not expect her to have any further periods and in fact she does not need to use contraception while she is on the goserelin. The acne will resolve as well  She is tolerating the Herceptin well. She was supposed to have had an echocardiogram last week but had to postpone it so that will be done later this week. She is going to be on medication a week of July 5 which is when she was supposed to have a treatment. What I will do when I see her July 14 is give her 2 week dose so she will retreated again on June 28 and that will take care of the problem regarding the medication  She will be starting her radiation treatments tomorrow. When she  completes radiation she will start on anastrozole. We will obtain a bone density before that. Chauncey Cruel, MD   04/15/2015 9:29 AM

## 2015-04-15 NOTE — Telephone Encounter (Signed)
appointments made and avs printed for patient °

## 2015-04-16 ENCOUNTER — Ambulatory Visit
Admission: RE | Admit: 2015-04-16 | Discharge: 2015-04-16 | Disposition: A | Discharge status: Home / Self Care | Payer: 59 | Source: Ambulatory Visit | Attending: Radiation Oncology | Admitting: Radiation Oncology

## 2015-04-16 DIAGNOSIS — Z510 Encounter for antineoplastic radiation therapy: Secondary | ICD-10-CM | POA: Diagnosis not present

## 2015-04-17 ENCOUNTER — Ambulatory Visit
Admission: RE | Admit: 2015-04-17 | Discharge: 2015-04-17 | Disposition: A | Discharge status: Home / Self Care | Payer: 59 | Source: Ambulatory Visit | Attending: Radiation Oncology | Admitting: Radiation Oncology

## 2015-04-17 ENCOUNTER — Ambulatory Visit (HOSPITAL_COMMUNITY)
Admission: RE | Admit: 2015-04-17 | Discharge: 2015-04-17 | Disposition: A | Discharge status: Home / Self Care | Payer: 59 | Source: Ambulatory Visit | Attending: Oncology | Admitting: Oncology

## 2015-04-17 ENCOUNTER — Other Ambulatory Visit: Payer: Self-pay | Admitting: Oncology

## 2015-04-17 DIAGNOSIS — C50412 Malignant neoplasm of upper-outer quadrant of left female breast: Secondary | ICD-10-CM

## 2015-04-17 DIAGNOSIS — D701 Agranulocytosis secondary to cancer chemotherapy: Secondary | ICD-10-CM

## 2015-04-17 DIAGNOSIS — G620 Drug-induced polyneuropathy: Secondary | ICD-10-CM | POA: Diagnosis not present

## 2015-04-17 DIAGNOSIS — T451X5D Adverse effect of antineoplastic and immunosuppressive drugs, subsequent encounter: Secondary | ICD-10-CM | POA: Diagnosis not present

## 2015-04-17 DIAGNOSIS — T451X5A Adverse effect of antineoplastic and immunosuppressive drugs, initial encounter: Secondary | ICD-10-CM

## 2015-04-17 DIAGNOSIS — Z510 Encounter for antineoplastic radiation therapy: Secondary | ICD-10-CM | POA: Diagnosis not present

## 2015-04-17 NOTE — Progress Notes (Signed)
  Echocardiogram 2D Echocardiogram has been performed.  Nancy Austin 04/17/2015, 9:48 AM

## 2015-04-18 ENCOUNTER — Ambulatory Visit
Admission: RE | Admit: 2015-04-18 | Discharge: 2015-04-18 | Disposition: A | Discharge status: Home / Self Care | Payer: 59 | Source: Ambulatory Visit | Attending: Radiation Oncology | Admitting: Radiation Oncology

## 2015-04-18 DIAGNOSIS — Z510 Encounter for antineoplastic radiation therapy: Secondary | ICD-10-CM | POA: Diagnosis not present

## 2015-04-21 ENCOUNTER — Ambulatory Visit
Admission: RE | Admit: 2015-04-21 | Discharge: 2015-04-21 | Disposition: A | Discharge status: Home / Self Care | Payer: 59 | Source: Ambulatory Visit | Attending: Radiation Oncology | Admitting: Radiation Oncology

## 2015-04-21 DIAGNOSIS — Z510 Encounter for antineoplastic radiation therapy: Secondary | ICD-10-CM | POA: Diagnosis not present

## 2015-04-22 ENCOUNTER — Ambulatory Visit
Admission: RE | Admit: 2015-04-22 | Discharge: 2015-04-22 | Disposition: A | Discharge status: Home / Self Care | Payer: 59 | Source: Ambulatory Visit | Attending: Radiation Oncology | Admitting: Radiation Oncology

## 2015-04-22 ENCOUNTER — Ambulatory Visit: Payer: 59 | Attending: General Surgery

## 2015-04-22 VITALS — BP 113/73 | HR 56 | Temp 98.1°F | Resp 16 | Ht 66.0 in | Wt 141.7 lb

## 2015-04-22 DIAGNOSIS — M25612 Stiffness of left shoulder, not elsewhere classified: Secondary | ICD-10-CM | POA: Diagnosis not present

## 2015-04-22 DIAGNOSIS — M25512 Pain in left shoulder: Secondary | ICD-10-CM | POA: Diagnosis not present

## 2015-04-22 DIAGNOSIS — Z510 Encounter for antineoplastic radiation therapy: Secondary | ICD-10-CM | POA: Diagnosis not present

## 2015-04-22 DIAGNOSIS — C50412 Malignant neoplasm of upper-outer quadrant of left female breast: Secondary | ICD-10-CM | POA: Diagnosis not present

## 2015-04-22 MED ORDER — RADIAPLEXRX EX GEL
Freq: Once | CUTANEOUS | Status: AC
  Administered 2015-04-22: 13:00:00 via TOPICAL

## 2015-04-22 MED ORDER — ALRA NON-METALLIC DEODORANT (RAD-ONC)
1.0000 "application " | Freq: Once | TOPICAL | Status: AC
  Administered 2015-04-22: 1 via TOPICAL

## 2015-04-22 NOTE — Progress Notes (Signed)
  Radiation Oncology         [phone] ________________________________  Name: Nancy Austin MRN: [id number]  Date: 04/08/2015  DOB: Jan 07, 1976  RESPIRATORY MOTION MANAGEMENT SIMULATION  NARRATIVE:  In order to account for effect of respiratory motion on target structures and other organs in the planning and delivery of radiotherapy, this patient underwent respiratory motion management simulation.  To accomplish this, when the patient was brought to the CT simulation planning suite, 4D respiratoy motion management CT images were obtained.  The CT images were loaded into the planning software.  Then, using a variety of tools including Cine, MIP, and standard views, the target volume and planning target volumes (PTV) were delineated.  Avoidance structures were contoured.  Treatment planning then occurred.  Dose volume histograms were generated and reviewed for each of the requested structure.  The resulting plan was carefully reviewed and approved today.  -----------------------------------  Blair Promise, PhD, MD

## 2015-04-22 NOTE — Therapy (Signed)
[place], [place], [address] Phone: [phone]   Fax:  [phone]  Physical Therapy Treatment  Patient Details  Name: Nancy Austin MRN: [id number] Date of Birth: 1976/10/22 Referring Provider:  Rolm Bookbinder, MD  Encounter Date: 04/22/2015      PT End of Session - 04/22/15 1148    Visit Number 10   Number of Visits 12   Date for PT Re-Evaluation 04/18/15      Past Medical History  Diagnosis Date  . Breast cancer of upper-outer quadrant of left female breast 09/24/2014  . History of chemotherapy     finished 02/10/2015    Past Surgical History  Procedure Laterality Date  . Tonsillectomy  1996  . Portacath placement N/A 10/15/2014    Procedure: INSERTION PORT-A-CATH;  Surgeon: Rolm Bookbinder, MD;  Location: [facility];  Service: General;  Laterality: N/A;  . Radioactive seed guided mastectomy with axillary sentinel lymph node biopsy Left 03/06/2015    Procedure: RADIOACTIVE SEED GUIDED PARTIAL MASTECTOMY WITH AXILLARY SENTINEL LYMPH NODE BIOPSY;  Surgeon: Rolm Bookbinder, MD;  Location: [facility];  Service: General;  Laterality: Left;    There were no vitals filed for this visit.  Visit Diagnosis:  Left shoulder pain  Shoulder stiffness, left      Subjective Assessment - 04/22/15 0857    Subjective My ROM is much better, that feels great, but my hand swelling with pain keeps bothering me when I walk or exercise. Going to get a sleeve and gauntlet now that I have my presription.    Currently in Pain? No/denies            [facility] PT Assessment - 04/22/15 0001    AROM   Left Shoulder Flexion 150 Degrees   Left Shoulder ABduction 172 Degrees                     [facility] Adult PT Treatment/Exercise - 04/22/15 0001    Shoulder Exercises: Supine   Horizontal ABduction AROM;Both;10 reps  Laying on towel roll   Flexion AROM;Both;10 reps  Scaption also  done for 10 reps all on towel roll   Other Supine Exercises Supine on towel roll with UE's in horizontal abduction x2 minutes   Shoulder Exercises: Pulleys   Flexion 2 minutes   Shoulder Exercises: ROM/Strengthening   Other ROM/Strengthening Exercises Finger Ladder for Lt UE abduction stretch with "hang" from hand 10 reps   Shoulder Exercises: Stretch   Corner Stretch 5 reps;10 seconds  In doorway   Other Shoulder Stretches Lt UE neural tension stretch 3 reps, 5second holds on wall   Manual Therapy   Myofascial Release Lt UE pulling into flexion and abduction   Other Manual Therapy left UE neural tension stretch                    Short Term Clinic Goals - 03/19/15 1220    CC Short Term Goal  #1   Title short term goals= long term goals             Long Term Clinic Goals - 04/22/15 0932    CC Long Term Goal  #1   Title pt will verbalize knowledge of lymphedem risk reduction pratices.   Status Achieved   CC Long Term Goal  #2   Title pt will have left shoulder abduction of  120 degrees so that she can receive radiation treatmet  172 degrees on  last visit 04/22/15   Status Achieved   CC Long Term Goal  #3   Title pt will report a decrease in pain by 50% so she can perform daily tasks with greater ease   90-100% improved 04/22/15   Status Achieved   CC Long Term Goal  #4   Title pt will decrease DASH score to 20 demonstrating functional improvement of arm    Status Achieved            Plan - 04/22/15 0935    Clinical Impression Statement Pt has done excellent with progress with therapy. All goals met and over past 2 weeks that she has been having radiation she has continued with HEP and her ROM was improved today from last visit. Pt is ready for discharge.   Pt will benefit from skilled therapeutic intervention in order to improve on the following deficits Decreased strength;Pain;Decreased knowledge of use of DME;Increased edema;Decreased range of motion    Rehab Potential Excellent   Clinical Impairments Affecting Rehab Potential previous chemotherapy and now radiation   PT Frequency 3x / week   PT Duration 4 weeks   PT Treatment/Interventions Therapeutic exercise;Passive range of motion;Manual techniques   PT Next Visit Plan Discharge visit.   Recommended Other Services Gave pt copy of prescription for compression sleeve/gauntlet.   Consulted and Agree with Plan of Care Patient        Problem List Patient Active Problem List   Diagnosis Date Noted  . Chemotherapy induced neutropenia 02/03/2015  . Bronchitis 01/23/2015  . 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 Constipation 12/09/2014  . Hot flashes 12/09/2014  . Spotting 11/20/2014  . Vasovagal near syncope 11/16/2014  . Breast cancer of upper-outer quadrant of left female breast 09/24/2014    Austin,Nancy 04/22/2015, 11:48 AM  [place], [place], [address] Phone: [phone]   Fax:  [phone]   PHYSICAL THERAPY DISCHARGE SUMMARY  Visits from Start of Care: 10  Current functional level related to goals / functional outcomes: Goals met as noted above.   Remaining deficits: Patient will continue to need to do home exercise program and self-care, practicing lymphedema risk reduction. No cording palpable today by Collie Siad, PTA.   Education / Equipment: HEP, lymphedema risk reduction. Plan: Patient agrees to discharge.  Patient goals were partially met. Patient is being discharged due to meeting the stated rehab goals.  ?????   Serafina Royals, PT 04/22/2015 11:50 AM

## 2015-04-22 NOTE — Progress Notes (Signed)
Nancy Austin has completed 5 fractions to her left breast and subclavian area.  She denies pain.  She reports fatigue.  The skin on her left chest is intact.    BP 113/73 mmHg  Pulse 56  Temp(Src) 98.1 F (36.7 C) (Oral)  Resp 16  Ht 5\' 6"  (1.676 m)  Wt 141 lb 11.2 oz (64.275 kg)  BMI 22.88 kg/m2  LMP 02/22/2015

## 2015-04-22 NOTE — Progress Notes (Signed)
  Radiation Oncology         [phone] ________________________________  Name: Nancy Austin MRN: [id number]  Date: 04/08/2015  DOB: 1976-04-18  SIMULATION AND TREATMENT PLANNING NOTE    ICD-9-CM ICD-10-CM   1. Breast cancer of upper-outer quadrant of left female breast 174.4 C50.412     DIAGNOSIS:  ypT1c ypN1a, stage IIA invasive ductal carcinoma of the left breast, grade 1, negative margins  NARRATIVE:  The patient was brought to the [facility].  Identity was confirmed.  All relevant records and images related to the planned course of therapy were reviewed.  The patient freely provided informed written consent to proceed with treatment after reviewing the details related to the planned course of therapy. The consent form was witnessed and verified by the simulation staff.  Then, the patient was set-up in a stable reproducible  supine position for radiation therapy.  CT images were obtained.  Surface markings were placed.  The CT images were loaded into the planning software.  Then the target and avoidance structures were contoured.  Treatment planning then occurred.  The radiation prescription was entered and confirmed.  Then, I designed and supervised the construction of a total of 5 medically necessary complex treatment devices.  I have requested : 3D Simulation  I have requested a DVH of the following structures: heart, lungs, lumpectomy cavity.  I have ordered:dose calc.  PLAN:  The patient will receive 50.4 Gy in 28 fractions using tangential beams directed at the left breast. Axillary/supraclavicular region will be treated with a right anterior oblique and PA field to 45 gray. The lumpectomy cavity will be the boosted to 60.4 gray  ________________________________  -----------------------------------  Blair Promise, PhD, MD

## 2015-04-22 NOTE — Progress Notes (Signed)
Pt here for patient teaching.  Pt given Radiation and You booklet, skin care instructions, Alra deodorant and Radiaplex gel. Reviewed areas of pertinence such as fatigue, skin changes and breast tenderness . Pt able to give teach back of to pat skin and use unscented/gentle soap,apply Radiaplex bid, avoid applying anything to skin within 4 hours of treatment, avoid wearing an under wire bra and to use an electric razor if they must shave. Pt demonstrated understanding and verbalizes understanding of information given and will contact nursing with any questions or concerns.

## 2015-04-22 NOTE — Therapy (Signed)
[place], [place], [address] Phone: [phone]   Fax:  [phone]  Physical Therapy Treatment  Patient Details  Name: Nancy Austin MRN: [id number] Date of Birth: May 24, 1976 Referring Provider:  Rolm Bookbinder, MD  Encounter Date: 04/22/2015      PT End of Session - 04/22/15 0935    Visit Number 9   Number of Visits 12   Date for PT Re-Evaluation 04/18/15   PT Start Time 9150   PT Stop Time 0931   PT Time Calculation (min) 39 min      Past Medical History  Diagnosis Date  . Breast cancer of upper-outer quadrant of left female breast 09/24/2014  . History of chemotherapy     finished 02/10/2015    Past Surgical History  Procedure Laterality Date  . Tonsillectomy  1996  . Portacath placement N/A 10/15/2014    Procedure: INSERTION PORT-A-CATH;  Surgeon: Rolm Bookbinder, MD;  Location: [facility];  Service: General;  Laterality: N/A;  . Radioactive seed guided mastectomy with axillary sentinel lymph node biopsy Left 03/06/2015    Procedure: RADIOACTIVE SEED GUIDED PARTIAL MASTECTOMY WITH AXILLARY SENTINEL LYMPH NODE BIOPSY;  Surgeon: Rolm Bookbinder, MD;  Location: [facility];  Service: General;  Laterality: Left;    There were no vitals filed for this visit.  Visit Diagnosis:  Left shoulder pain  Shoulder stiffness, left      Subjective Assessment - 04/22/15 0857    Subjective My ROM is much better, that feels great, but my hand swelling with pain keeps bothering me when I walk or exercise. Going to get a sleeve and gauntlet now that I have my presription.    Currently in Pain? No/denies            [facility] PT Assessment - 04/22/15 0001    AROM   Left Shoulder Flexion 150 Degrees   Left Shoulder ABduction 172 Degrees                     [facility] Adult PT Treatment/Exercise - 04/22/15 0001    Shoulder Exercises: Supine   Horizontal ABduction  AROM;Both;10 reps  Laying on towel roll   Flexion AROM;Both;10 reps  Scaption also done for 10 reps all on towel roll   Other Supine Exercises Supine on towel roll with UE's in horizontal abduction x2 minutes   Shoulder Exercises: Pulleys   Flexion 2 minutes   Shoulder Exercises: ROM/Strengthening   Other ROM/Strengthening Exercises Finger Ladder for Lt UE abduction stretch with "hang" from hand 10 reps   Shoulder Exercises: Stretch   Corner Stretch 5 reps;10 seconds  In doorway   Other Shoulder Stretches Lt UE neural tension stretch 3 reps, 5second holds on wall   Manual Therapy   Myofascial Release Lt UE pulling into flexion and abduction   Other Manual Therapy left UE neural tension stretch                    Short Term Clinic Goals - 03/19/15 1220    CC Short Term Goal  #1   Title short term goals= long term goals             Long Term Clinic Goals - 04/22/15 0932    CC Long Term Goal  #1   Title pt will verbalize knowledge of lymphedem risk reduction pratices.   Status Achieved   CC Long Term Goal  #2   Title pt  will have left shoulder abduction of  120 degrees so that she can receive radiation treatmet  172 degrees on last visit 04/22/15   Status Achieved   CC Long Term Goal  #3   Title pt will report a decrease in pain by 50% so she can perform daily tasks with greater ease   90-100% improved 04/22/15   Status Achieved   CC Long Term Goal  #4   Title pt will decrease DASH score to 20 demonstrating functional improvement of arm    Status Achieved            Plan - 04/22/15 0935    Clinical Impression Statement Pt has done excellent with progress with therapy. All goals met and over past 2 weeks that she has been having radiation she has continued with HEP and her ROM was improved today from last visit. Pt is ready for discharge.   Pt will benefit from skilled therapeutic intervention in order to improve on the following deficits Decreased  strength;Pain;Decreased knowledge of use of DME;Increased edema;Decreased range of motion   Rehab Potential Excellent   Clinical Impairments Affecting Rehab Potential previous chemotherapy and now radiation   PT Frequency 3x / week   PT Duration 4 weeks   PT Treatment/Interventions Therapeutic exercise;Passive range of motion;Manual techniques   PT Next Visit Plan Discharge visit.   Recommended Other Services Gave pt copy of prescription for compression sleeve/gauntlet.   Consulted and Agree with Plan of Care Patient        Problem List Patient Active Problem List   Diagnosis Date Noted  . Chemotherapy induced neutropenia 02/03/2015  . Bronchitis 01/23/2015  . 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 Constipation 12/09/2014  . Hot flashes 12/09/2014  . Spotting 11/20/2014  . Vasovagal near syncope 11/16/2014  . Breast cancer of upper-outer quadrant of left female breast 09/24/2014    Otelia Limes, PTA 04/22/2015, 9:38 AM  [place], [place], [address] Phone: [phone]   Fax:  [phone]

## 2015-04-22 NOTE — Progress Notes (Signed)
  Radiation Oncology         [phone] ________________________________  Name: TIEN AISPURO MRN: [id number]  Date: 04/22/2015  DOB: [birth year]  Weekly Radiation Therapy Management  Diagnosis: ypT1c, ypN1a, stage IIA invasive ductal carcinoma of the left breast, grade 1, negative margins  Current Dose: 9 Gy     Planned Dose:  60.4 Gy  Narrative . . . . . . . . The patient presents for routine under treatment assessment.                                   The patient is without complaint.                                 Set-up films were reviewed.                                 The chart was checked. Physical Findings. . .  height is 5\' 6"  (1.676 m) and weight is 141 lb 11.2 oz (64.275 kg). Her oral temperature is 98.1 F (36.7 C). Her blood pressure is 113/73 and her pulse is 56. Her respiration is 16. . Weight essentially stable.  No significant changes.  The lungs are clear. The heart has a regular rhythm and rate. No significant reaction noted in the left breast or supraclavicular region Impression . . . . . . . The patient is tolerating radiation. Plan . . . . . . . . . . . . Continue treatment as planned.  ________________________________   Blair Promise, PhD, MD

## 2015-04-22 NOTE — Progress Notes (Signed)
  Radiation Oncology         [phone] ________________________________  Name: AVYONNA WAGONER MRN: [id number]  Date: 04/08/2015  DOB: May 24, 1976  Optical Surface Tracking Plan:  Since intensity modulated radiotherapy (IMRT) and 3D conformal radiation treatment methods are predicated on accurate and precise positioning for treatment, intrafraction motion monitoring is medically necessary to ensure accurate and safe treatment delivery.  The ability to quantify intrafraction motion without excessive ionizing radiation dose can only be performed with optical surface tracking. Accordingly, surface imaging offers the opportunity to obtain 3D measurements of patient position throughout IMRT and 3D treatments without excessive radiation exposure.  I am ordering optical surface tracking for this patient's upcoming course of radiotherapy. ________________________________  Blair Promise, MD 04/22/2015 3:27 PM    Reference:   Particia Jasper, et al. Surface imaging-based analysis of intrafraction motion for breast radiotherapy patients.Journal of [place], n. 6, nov. 2014. ISSN [id number].   Available at: <http://www.jacmp.org/index.php/jacmp/article/view/4957>.

## 2015-04-23 ENCOUNTER — Ambulatory Visit
Admission: RE | Admit: 2015-04-23 | Discharge: 2015-04-23 | Disposition: A | Discharge status: Home / Self Care | Payer: 59 | Source: Ambulatory Visit | Attending: Radiation Oncology | Admitting: Radiation Oncology

## 2015-04-23 DIAGNOSIS — Z510 Encounter for antineoplastic radiation therapy: Secondary | ICD-10-CM | POA: Diagnosis not present

## 2015-04-24 ENCOUNTER — Ambulatory Visit
Admission: RE | Admit: 2015-04-24 | Discharge: 2015-04-24 | Disposition: A | Discharge status: Home / Self Care | Payer: 59 | Source: Ambulatory Visit | Attending: Radiation Oncology | Admitting: Radiation Oncology

## 2015-04-24 DIAGNOSIS — Z510 Encounter for antineoplastic radiation therapy: Secondary | ICD-10-CM | POA: Diagnosis not present

## 2015-04-25 ENCOUNTER — Ambulatory Visit
Admission: RE | Admit: 2015-04-25 | Discharge: 2015-04-25 | Disposition: A | Discharge status: Home / Self Care | Payer: 59 | Source: Ambulatory Visit | Attending: Radiation Oncology | Admitting: Radiation Oncology

## 2015-04-25 DIAGNOSIS — Z510 Encounter for antineoplastic radiation therapy: Secondary | ICD-10-CM | POA: Diagnosis not present

## 2015-04-28 ENCOUNTER — Ambulatory Visit
Admission: RE | Admit: 2015-04-28 | Discharge: 2015-04-28 | Disposition: A | Discharge status: Home / Self Care | Payer: 59 | Source: Ambulatory Visit | Attending: Radiation Oncology | Admitting: Radiation Oncology

## 2015-04-28 ENCOUNTER — Other Ambulatory Visit: Payer: Self-pay | Admitting: Nurse Practitioner

## 2015-04-28 DIAGNOSIS — Z510 Encounter for antineoplastic radiation therapy: Secondary | ICD-10-CM | POA: Diagnosis not present

## 2015-04-29 ENCOUNTER — Ambulatory Visit
Admission: RE | Admit: 2015-04-29 | Discharge: 2015-04-29 | Disposition: A | Discharge status: Home / Self Care | Payer: 59 | Source: Ambulatory Visit | Attending: Radiation Oncology | Admitting: Radiation Oncology

## 2015-04-29 ENCOUNTER — Encounter: Payer: Self-pay | Admitting: Radiation Oncology

## 2015-04-29 VITALS — BP 117/78 | HR 64 | Temp 98.2°F | Resp 16 | Ht 66.0 in | Wt 141.2 lb

## 2015-04-29 DIAGNOSIS — Z510 Encounter for antineoplastic radiation therapy: Secondary | ICD-10-CM | POA: Diagnosis not present

## 2015-04-29 DIAGNOSIS — C50412 Malignant neoplasm of upper-outer quadrant of left female breast: Secondary | ICD-10-CM

## 2015-04-29 NOTE — Progress Notes (Signed)
Nancy Austin has completed 10 fractions to her left breast.  She denies pain.  She reports fatigue.  Her heart rate was low today at 48 and 64 when retaken manually. She denies feeling dizzy.  The skin on her left breast is pink.  She is using radiaplex gel.  BP 117/78 mmHg  Pulse 48  Temp(Src) 98.2 F (36.8 C) (Oral)  Resp 16  Ht 5\' 6"  (1.676 m)  Wt 141 lb 3.2 oz (64.048 kg)  BMI 22.80 kg/m2  SpO2 100%

## 2015-04-29 NOTE — Progress Notes (Signed)
  Radiation Oncology         [phone]) [phone] ________________________________  Name: Nancy Austin MRN: [id number]  Date: 04/29/2015  DOB: 1976/03/18  Weekly Radiation Therapy Management  Diagnosis: ypT1c, ypN1a, stage IIA invasive ductal carcinoma of the left breast, grade 1, negative margins  Current Dose: 18 Gy     Planned Dose:  60.4 Gy  Narrative . . . . . . . . The patient presents for routine under treatment assessment.                                   The patient is without complaint. Some mild fatigue late in the afternoons. She has noticed mild swelling within the breast but no itching or discomfort.                                 Set-up films were reviewed.                                 The chart was checked. Physical Findings. . .  height is 5\' 6"  (1.676 m) and weight is 141 lb 3.2 oz (64.048 kg). Her oral temperature is 98.2 F (36.8 C). Her blood pressure is 117/78 and her pulse is 64. Her respiration is 16 and oxygen saturation is 100%. . The lungs are clear. The heart has a regular rhythm and rate. Examination of the left breast and supraclavicular region reveals some radiation dermatitis particularly in the upper aspect of the breast and supraclavicular region. Impression . . . . . . . The patient is tolerating radiation. Plan . . . . . . . . . . . . Continue treatment as planned.  ________________________________   Blair Promise, PhD, MD

## 2015-04-30 ENCOUNTER — Ambulatory Visit
Admission: RE | Admit: 2015-04-30 | Discharge: 2015-04-30 | Disposition: A | Discharge status: Home / Self Care | Payer: 59 | Source: Ambulatory Visit | Attending: Radiation Oncology | Admitting: Radiation Oncology

## 2015-04-30 ENCOUNTER — Other Ambulatory Visit (HOSPITAL_BASED_OUTPATIENT_CLINIC_OR_DEPARTMENT_OTHER): Payer: 59

## 2015-04-30 DIAGNOSIS — Z510 Encounter for antineoplastic radiation therapy: Secondary | ICD-10-CM | POA: Diagnosis not present

## 2015-04-30 DIAGNOSIS — C50412 Malignant neoplasm of upper-outer quadrant of left female breast: Secondary | ICD-10-CM

## 2015-04-30 DIAGNOSIS — T451X5A Adverse effect of antineoplastic and immunosuppressive drugs, initial encounter: Secondary | ICD-10-CM

## 2015-04-30 DIAGNOSIS — C50812 Malignant neoplasm of overlapping sites of left female breast: Secondary | ICD-10-CM | POA: Diagnosis not present

## 2015-04-30 DIAGNOSIS — G620 Drug-induced polyneuropathy: Secondary | ICD-10-CM

## 2015-04-30 DIAGNOSIS — D701 Agranulocytosis secondary to cancer chemotherapy: Secondary | ICD-10-CM

## 2015-04-30 LAB — COMPREHENSIVE METABOLIC PANEL (CC13)
ALT: 23 U/L (ref 0–55)
AST: 24 U/L (ref 5–34)
Albumin: 4 g/dL (ref 3.5–5.0)
Alkaline Phosphatase: 56 U/L (ref 40–150)
Anion Gap: 11 mEq/L (ref 3–11)
BUN: 11.4 mg/dL (ref 7.0–26.0)
CALCIUM: 9.4 mg/dL (ref 8.4–10.4)
CHLORIDE: 105 meq/L (ref 98–109)
CO2: 26 meq/L (ref 22–29)
Creatinine: 0.7 mg/dL (ref 0.6–1.1)
EGFR: >90 mL/min/{1.73_m2} (ref >90)
Glucose: 87 mg/dl (ref 70–140)
Potassium: 4.1 mEq/L (ref 3.5–5.1)
SODIUM: 142 meq/L (ref 136–145)
TOTAL PROTEIN: 6.7 g/dL (ref 6.4–8.3)
Total Bilirubin: 0.83 mg/dL (ref 0.20–1.20)

## 2015-04-30 LAB — CBC WITH DIFFERENTIAL/PLATELET
BASO%: 0 % (ref 0.0–2.0)
Basophils Absolute: 0 10*3/uL (ref 0.0–0.1)
EOS%: 1 % (ref 0.0–7.0)
Eosinophils Absolute: 0 10*3/uL (ref 0.0–0.5)
HEMATOCRIT: 39.6 % (ref 34.8–46.6)
HGB: 13.1 g/dL (ref 11.6–15.9)
LYMPH#: 0.6 10*3/uL — AB (ref 0.9–3.3)
LYMPH%: 29.8 % (ref 14.0–49.7)
MCH: 29.6 pg (ref 25.1–34.0)
MCHC: 33.1 g/dL (ref 31.5–36.0)
MCV: 89.6 fL (ref 79.5–101.0)
MONO#: 0.1 10*3/uL (ref 0.1–0.9)
MONO%: 6.7 % (ref 0.0–14.0)
NEUT#: 1.3 10*3/uL — ABNORMAL LOW (ref 1.5–6.5)
NEUT%: 62.5 % (ref 38.4–76.8)
Platelets: 175 10*3/uL (ref 145–400)
RBC: 4.42 10*6/uL (ref 3.70–5.45)
RDW: 12.1 % (ref 11.2–14.5)
WBC: 2.1 10*3/uL — ABNORMAL LOW (ref 3.9–10.3)

## 2015-04-30 LAB — FOLLICLE STIMULATING HORMONE: FSH: 0.8 m[IU]/mL

## 2015-05-01 ENCOUNTER — Ambulatory Visit: Payer: 59

## 2015-05-01 ENCOUNTER — Ambulatory Visit (HOSPITAL_BASED_OUTPATIENT_CLINIC_OR_DEPARTMENT_OTHER): Payer: 59

## 2015-05-01 ENCOUNTER — Ambulatory Visit
Admission: RE | Admit: 2015-05-01 | Discharge: 2015-05-01 | Disposition: A | Discharge status: Home / Self Care | Payer: 59 | Source: Ambulatory Visit | Attending: Radiation Oncology | Admitting: Radiation Oncology

## 2015-05-01 VITALS — BP 110/70 | HR 58 | Temp 98.3°F | Resp 16

## 2015-05-01 DIAGNOSIS — Z5111 Encounter for antineoplastic chemotherapy: Secondary | ICD-10-CM | POA: Diagnosis not present

## 2015-05-01 DIAGNOSIS — C50412 Malignant neoplasm of upper-outer quadrant of left female breast: Secondary | ICD-10-CM

## 2015-05-01 DIAGNOSIS — Z510 Encounter for antineoplastic radiation therapy: Secondary | ICD-10-CM | POA: Diagnosis not present

## 2015-05-01 DIAGNOSIS — C50812 Malignant neoplasm of overlapping sites of left female breast: Secondary | ICD-10-CM | POA: Diagnosis not present

## 2015-05-01 MED ORDER — GOSERELIN ACETATE 3.6 MG ~~LOC~~ IMPL
3.6000 mg | DRUG_IMPLANT | SUBCUTANEOUS | Status: DC
  Administered 2015-05-01: 3.6 mg via SUBCUTANEOUS
  Filled 2015-05-01: qty 3.6

## 2015-05-01 NOTE — Patient Instructions (Signed)
Goserelin injection What is this medicine? GOSERELIN (GOE se rel in) is similar to a hormone found in the body. It lowers the amount of sex hormones that the body makes. Men will have lower testosterone levels and women will have lower estrogen levels while taking this medicine. In men, this medicine is used to treat prostate cancer; the injection is either given once per month or once every 12 weeks. A once per month injection (only) is used to treat women with endometriosis, dysfunctional uterine bleeding, or advanced breast cancer. This medicine may be used for other purposes; ask your health care provider or pharmacist if you have questions. COMMON BRAND NAME(S): Zoladex What should I tell my health care provider before I take this medicine? They need to know if you have any of these conditions (some only apply to women): -diabetes -heart disease or previous heart attack -high blood pressure -high cholesterol -kidney disease -osteoporosis or low bone density -problems passing urine -spinal cord injury -stroke -tobacco smoker -an unusual or allergic reaction to goserelin, hormone therapy, other medicines, foods, dyes, or preservatives -pregnant or trying to get pregnant -breast-feeding How should I use this medicine? This medicine is for injection under the skin. It is given by a health care professional in a hospital or clinic setting. Men receive this injection once every 4 weeks or once every 12 weeks. Women will only receive the once every 4 weeks injection. Talk to your pediatrician regarding the use of this medicine in children. Special care may be needed. Overdosage: If you think you have taken too much of this medicine contact a poison control center or emergency room at once. NOTE: This medicine is only for you. Do not share this medicine with others. What if I miss a dose? It is important not to miss your dose. Call your doctor or health care professional if you are unable to  keep an appointment. What may interact with this medicine? -female hormones like estrogen -herbal or dietary supplements like black cohosh, chasteberry, or DHEA -female hormones like testosterone -prasterone This list may not describe all possible interactions. Give your health care provider a list of all the medicines, herbs, non-prescription drugs, or dietary supplements you use. Also tell them if you smoke, drink alcohol, or use illegal drugs. Some items may interact with your medicine. What should I watch for while using this medicine? Visit your doctor or health care professional for regular checks on your progress. Your symptoms may appear to get worse during the first weeks of this therapy. Tell your doctor or healthcare professional if your symptoms do not start to get better or if they get worse after this time. Your bones may get weaker if you take this medicine for a long time. If you smoke or frequently drink alcohol you may increase your risk of bone loss. A family history of osteoporosis, chronic use of drugs for seizures (convulsions), or corticosteroids can also increase your risk of bone loss. Talk to your doctor about how to keep your bones strong. This medicine should stop regular monthly menstration in women. Tell your doctor if you continue to menstrate. Women should not become pregnant while taking this medicine or for 12 weeks after stopping this medicine. Women should inform their doctor if they wish to become pregnant or think they might be pregnant. There is a potential for serious side effects to an unborn child. Talk to your health care professional or pharmacist for more information. Do not breast-feed an infant while taking   this medicine. Men should inform their doctors if they wish to father a child. This medicine may lower sperm counts. Talk to your health care professional or pharmacist for more information. What side effects may I notice from receiving this  medicine? Side effects that you should report to your doctor or health care professional as soon as possible: -allergic reactions like skin rash, itching or hives, swelling of the face, lips, or tongue -bone pain -breathing problems -changes in vision -chest pain -feeling faint or lightheaded, falls -fever, chills -pain, swelling, warmth in the leg -pain, tingling, numbness in the hands or feet -signs and symptoms of low blood pressure like dizziness; feeling faint or lightheaded, falls; unusually weak or tired -stomach pain -swelling of the ankles, feet, hands -trouble passing urine or change in the amount of urine -unusually high or low blood pressure -unusually weak or tired Side effects that usually do not require medical attention (report to your doctor or health care professional if they continue or are bothersome): -change in sex drive or performance -changes in breast size in both males and females -changes in emotions or moods -headache -hot flashes -irritation at site where injected -loss of appetite -skin problems like acne, dry skin -vaginal dryness This list may not describe all possible side effects. Call your doctor for medical advice about side effects. You may report side effects to FDA at 1-800-FDA-1088. Where should I keep my medicine? This drug is given in a hospital or clinic and will not be stored at home. NOTE: This sheet is a summary. It may not cover all possible information. If you have questions about this medicine, talk to your doctor, pharmacist, or health care provider.  2015, [employer]vier/Gold Standard. (2014-02-05 11:10:35)  

## 2015-05-02 ENCOUNTER — Ambulatory Visit
Admission: RE | Admit: 2015-05-02 | Discharge: 2015-05-02 | Disposition: A | Discharge status: Home / Self Care | Payer: 59 | Source: Ambulatory Visit | Attending: Radiation Oncology | Admitting: Radiation Oncology

## 2015-05-02 ENCOUNTER — Encounter: Payer: Self-pay | Admitting: Radiation Oncology

## 2015-05-02 DIAGNOSIS — Z510 Encounter for antineoplastic radiation therapy: Secondary | ICD-10-CM | POA: Diagnosis not present

## 2015-05-02 NOTE — Progress Notes (Signed)
I emailed fmla forms to [place] in radiation

## 2015-05-03 LAB — ESTRADIOL, ULTRA SENS

## 2015-05-05 ENCOUNTER — Encounter: Payer: Self-pay | Admitting: Radiation Oncology

## 2015-05-05 ENCOUNTER — Ambulatory Visit
Admission: RE | Admit: 2015-05-05 | Discharge: 2015-05-05 | Disposition: A | Discharge status: Home / Self Care | Payer: 59 | Source: Ambulatory Visit | Attending: Radiation Oncology | Admitting: Radiation Oncology

## 2015-05-05 DIAGNOSIS — Z510 Encounter for antineoplastic radiation therapy: Secondary | ICD-10-CM | POA: Diagnosis not present

## 2015-05-05 NOTE — Progress Notes (Signed)
Received FMLA paperwork for patient's husband from [place], forwarded to RN for processing

## 2015-05-06 ENCOUNTER — Ambulatory Visit
Admission: RE | Admit: 2015-05-06 | Discharge: 2015-05-06 | Disposition: A | Discharge status: Home / Self Care | Payer: 59 | Source: Ambulatory Visit | Attending: Radiation Oncology | Admitting: Radiation Oncology

## 2015-05-06 ENCOUNTER — Ambulatory Visit (HOSPITAL_BASED_OUTPATIENT_CLINIC_OR_DEPARTMENT_OTHER): Payer: 59

## 2015-05-06 ENCOUNTER — Other Ambulatory Visit: Payer: Self-pay | Admitting: *Deleted

## 2015-05-06 ENCOUNTER — Other Ambulatory Visit (HOSPITAL_BASED_OUTPATIENT_CLINIC_OR_DEPARTMENT_OTHER): Payer: 59

## 2015-05-06 ENCOUNTER — Encounter: Payer: Self-pay | Admitting: Radiation Oncology

## 2015-05-06 VITALS — BP 108/65 | HR 55 | Temp 97.5°F | Resp 16

## 2015-05-06 VITALS — BP 127/84 | HR 57 | Temp 98.5°F | Resp 16 | Wt 138.9 lb

## 2015-05-06 DIAGNOSIS — C50412 Malignant neoplasm of upper-outer quadrant of left female breast: Secondary | ICD-10-CM

## 2015-05-06 DIAGNOSIS — Z5112 Encounter for antineoplastic immunotherapy: Secondary | ICD-10-CM | POA: Diagnosis not present

## 2015-05-06 DIAGNOSIS — C773 Secondary and unspecified malignant neoplasm of axilla and upper limb lymph nodes: Secondary | ICD-10-CM | POA: Diagnosis not present

## 2015-05-06 DIAGNOSIS — L598 Other specified disorders of the skin and subcutaneous tissue related to radiation: Secondary | ICD-10-CM | POA: Insufficient documentation

## 2015-05-06 DIAGNOSIS — Z510 Encounter for antineoplastic radiation therapy: Secondary | ICD-10-CM | POA: Diagnosis not present

## 2015-05-06 DIAGNOSIS — C50812 Malignant neoplasm of overlapping sites of left female breast: Secondary | ICD-10-CM | POA: Diagnosis not present

## 2015-05-06 LAB — CBC WITH DIFFERENTIAL/PLATELET
BASO%: 0.5 % (ref 0.0–2.0)
Basophils Absolute: 0 10*3/uL (ref 0.0–0.1)
EOS ABS: 0 10*3/uL (ref 0.0–0.5)
EOS%: 1.8 % (ref 0.0–7.0)
HEMATOCRIT: 39.4 % (ref 34.8–46.6)
HGB: 13.2 g/dL (ref 11.6–15.9)
LYMPH#: 0.6 10*3/uL — AB (ref 0.9–3.3)
LYMPH%: 29 % (ref 14.0–49.7)
MCH: 30 pg (ref 25.1–34.0)
MCHC: 33.5 g/dL (ref 31.5–36.0)
MCV: 89.5 fL (ref 79.5–101.0)
MONO#: 0.2 10*3/uL (ref 0.1–0.9)
MONO%: 7.4 % (ref 0.0–14.0)
NEUT%: 61.3 % (ref 38.4–76.8)
NEUTROS ABS: 1.3 10*3/uL — AB (ref 1.5–6.5)
PLATELETS: 176 10*3/uL (ref 145–400)
RBC: 4.4 10*6/uL (ref 3.70–5.45)
RDW: 12 % (ref 11.2–14.5)
WBC: 2.2 10*3/uL — ABNORMAL LOW (ref 3.9–10.3)

## 2015-05-06 LAB — COMPREHENSIVE METABOLIC PANEL (CC13)
ALT: 22 U/L (ref 0–55)
AST: 25 U/L (ref 5–34)
Albumin: 4.1 g/dL (ref 3.5–5.0)
Alkaline Phosphatase: 60 U/L (ref 40–150)
Anion Gap: 11 mEq/L (ref 3–11)
BUN: 8.7 mg/dL (ref 7.0–26.0)
CALCIUM: 9.2 mg/dL (ref 8.4–10.4)
CO2: 27 meq/L (ref 22–29)
Chloride: 104 mEq/L (ref 98–109)
Creatinine: 0.8 mg/dL (ref 0.6–1.1)
Glucose: 94 mg/dl (ref 70–140)
POTASSIUM: 3.7 meq/L (ref 3.5–5.1)
SODIUM: 142 meq/L (ref 136–145)
Total Bilirubin: 0.67 mg/dL (ref 0.20–1.20)
Total Protein: 6.8 g/dL (ref 6.4–8.3)

## 2015-05-06 MED ORDER — ACETAMINOPHEN 325 MG PO TABS
650.0000 mg | ORAL_TABLET | Freq: Once | ORAL | Status: AC
  Administered 2015-05-06: 650 mg via ORAL

## 2015-05-06 MED ORDER — HEPARIN SOD (PORK) LOCK FLUSH 100 UNIT/ML IV SOLN
500.0000 [IU] | Freq: Once | INTRAVENOUS | Status: AC | PRN
  Administered 2015-05-06: 500 [IU]
  Filled 2015-05-06: qty 5

## 2015-05-06 MED ORDER — BIAFINE EX EMUL
Freq: Once | CUTANEOUS | Status: AC
  Administered 2015-05-06: 09:00:00 via TOPICAL

## 2015-05-06 MED ORDER — SODIUM CHLORIDE 0.9 % IV SOLN
Freq: Once | INTRAVENOUS | Status: AC
  Administered 2015-05-06: 10:00:00 via INTRAVENOUS

## 2015-05-06 MED ORDER — DIPHENHYDRAMINE HCL 25 MG PO CAPS
25.0000 mg | ORAL_CAPSULE | Freq: Once | ORAL | Status: AC
  Administered 2015-05-06: 25 mg via ORAL

## 2015-05-06 MED ORDER — ACETAMINOPHEN 325 MG PO TABS
ORAL_TABLET | ORAL | Status: AC
  Filled 2015-05-06: qty 2

## 2015-05-06 MED ORDER — TRASTUZUMAB CHEMO INJECTION 440 MG
6.0000 mg/kg | Freq: Once | INTRAVENOUS | Status: AC
  Administered 2015-05-06: 399 mg via INTRAVENOUS
  Filled 2015-05-06: qty 19

## 2015-05-06 MED ORDER — DIPHENHYDRAMINE HCL 25 MG PO CAPS
ORAL_CAPSULE | ORAL | Status: AC
  Filled 2015-05-06: qty 1

## 2015-05-06 MED ORDER — SODIUM CHLORIDE 0.9 % IJ SOLN
10.0000 mL | INTRAMUSCULAR | Status: DC | PRN
  Administered 2015-05-06: 10 mL
  Filled 2015-05-06: qty 10

## 2015-05-06 NOTE — Addendum Note (Signed)
Encounter addended by: Gery Pray, MD on: 05/06/2015  9:26 AM<BR>     Documentation filed: Notes Section

## 2015-05-06 NOTE — Progress Notes (Addendum)
Weekly rad tx left breast, dermatitis rash on front and back, gave biaifine,c/o increased itching, appetite good 8:22 AM BP 127/84 mmHg  Pulse 57  Temp(Src) 98.5 F (36.9 C) (Oral)  Resp 16  Wt 138 lb 14.4 oz (63.005 kg)  Wt Readings from Last 3 Encounters:  05/06/15 138 lb 14.4 oz (63.005 kg)  04/22/15 141 lb 11.2 oz (64.275 kg)  04/15/15 142 lb 6.4 oz (64.592 kg)

## 2015-05-06 NOTE — Patient Outreach (Signed)
Attempt (first) made to contact [employer] member.  HIPPA compliant voice message left with contact number.   Plan to try again.       Zara Chess.   [facility]  [phone]

## 2015-05-06 NOTE — Progress Notes (Addendum)
  Radiation Oncology         (336) [phone] ________________________________  Name: Nancy Austin MRN: [id number]  Date: 05/06/2015  DOB: [birth year]  Weekly Radiation Therapy Management   Diagnosis: ypT1c, ypN1a, stage IIA invasive ductal carcinoma of the left breast, grade 1, negative margins   Current Dose: 27.0 Gy     Planned Dose:  60.4 Gy  Narrative . . . . . . . . The patient presents for routine under treatment assessment.                                   The patient is without complaint except for some itching in the upper chest and neck region                                 Set-up films were reviewed.                                 The chart was checked. Physical Findings. . .  weight is 138 lb 14.4 oz (63.005 kg). Her oral temperature is 98.5 F (36.9 C). Her blood pressure is 127/84 and her pulse is 57. Her respiration is 16. . The lungs are clear. The heart has regular rhythm and rate. Patient has radiation dermatitis in the upper aspect of the left breast and supraclavicular region as well as left upper back. No skin breakdown Impression . . . . . . . The patient is tolerating radiation. Plan . . . . . . . . . . . . Continue treatment as planned. Patient knows to use hydrocortisone cream if the itching is keeping her awake at night. We will also try Biafine to see if this will be more comforting for the patient. ________________________________   Blair Promise, PhD, MD

## 2015-05-06 NOTE — Patient Instructions (Signed)
Cancer Center Discharge Instructions for Patients Receiving Chemotherapy  Today you received the following chemotherapy agents:  Herceptin  To help prevent nausea and vomiting after your treatment, we encourage you to take your nausea medication as prescribed.   If you develop nausea and vomiting that is not controlled by your nausea medication, call the clinic.   BELOW ARE SYMPTOMS THAT SHOULD BE REPORTED IMMEDIATELY:  *FEVER GREATER THAN 100.5 F  *CHILLS WITH OR WITHOUT FEVER  NAUSEA AND VOMITING THAT IS NOT CONTROLLED WITH YOUR NAUSEA MEDICATION  *UNUSUAL SHORTNESS OF BREATH  *UNUSUAL BRUISING OR BLEEDING  TENDERNESS IN MOUTH AND THROAT WITH OR WITHOUT PRESENCE OF ULCERS  *URINARY PROBLEMS  *BOWEL PROBLEMS  UNUSUAL RASH Items with * indicate a potential emergency and should be followed up as soon as possible.  Feel free to call the clinic you have any questions or concerns. The clinic phone number is [phone].  Please show the CHEMO ALERT CARD at check-in to the Emergency Department and triage nurse.   

## 2015-05-07 ENCOUNTER — Other Ambulatory Visit: Payer: Self-pay | Admitting: *Deleted

## 2015-05-07 ENCOUNTER — Ambulatory Visit
Admission: RE | Admit: 2015-05-07 | Discharge: 2015-05-07 | Disposition: A | Discharge status: Home / Self Care | Payer: 59 | Source: Ambulatory Visit | Attending: Radiation Oncology | Admitting: Radiation Oncology

## 2015-05-07 DIAGNOSIS — Z510 Encounter for antineoplastic radiation therapy: Secondary | ICD-10-CM | POA: Diagnosis not present

## 2015-05-07 NOTE — Patient Outreach (Signed)
Second attempt to f/u with [employer] member. HIPPA compliant voice message left with contact number.   If no response, will try a third time.      Zara Chess.   [facility]  [phone]

## 2015-05-08 ENCOUNTER — Telehealth: Payer: Self-pay | Admitting: *Deleted

## 2015-05-08 ENCOUNTER — Ambulatory Visit
Admission: RE | Admit: 2015-05-08 | Discharge: 2015-05-08 | Disposition: A | Discharge status: Home / Self Care | Payer: 59 | Source: Ambulatory Visit | Attending: Radiation Oncology | Admitting: Radiation Oncology

## 2015-05-08 DIAGNOSIS — Z510 Encounter for antineoplastic radiation therapy: Secondary | ICD-10-CM | POA: Diagnosis not present

## 2015-05-08 NOTE — Telephone Encounter (Signed)
Called pt to assess needs during xrt. Relate she is doing well is starting to develop a little rash and her skin is getting slightly red. Received topical agent from xrt for redness and rash. Denies needs or concerns. Encourage pt to call with questions. Received verbal understanding.

## 2015-05-09 ENCOUNTER — Ambulatory Visit
Admission: RE | Admit: 2015-05-09 | Discharge: 2015-05-09 | Disposition: A | Discharge status: Home / Self Care | Payer: 59 | Source: Ambulatory Visit | Attending: Radiation Oncology | Admitting: Radiation Oncology

## 2015-05-09 DIAGNOSIS — Z510 Encounter for antineoplastic radiation therapy: Secondary | ICD-10-CM | POA: Diagnosis not present

## 2015-05-13 ENCOUNTER — Encounter: Payer: Self-pay | Admitting: Radiation Oncology

## 2015-05-13 ENCOUNTER — Other Ambulatory Visit: Payer: Self-pay | Admitting: *Deleted

## 2015-05-13 ENCOUNTER — Ambulatory Visit
Admission: RE | Admit: 2015-05-13 | Discharge: 2015-05-13 | Disposition: A | Discharge status: Home / Self Care | Payer: 59 | Source: Ambulatory Visit | Attending: Radiation Oncology | Admitting: Radiation Oncology

## 2015-05-13 VITALS — BP 117/78 | HR 55 | Temp 98.0°F | Resp 12 | Ht 66.0 in | Wt 138.1 lb

## 2015-05-13 DIAGNOSIS — Z510 Encounter for antineoplastic radiation therapy: Secondary | ICD-10-CM | POA: Diagnosis not present

## 2015-05-13 DIAGNOSIS — C50412 Malignant neoplasm of upper-outer quadrant of left female breast: Secondary | ICD-10-CM

## 2015-05-13 NOTE — Progress Notes (Signed)
  Radiation Oncology         [phone] ________________________________  Name: Nancy Austin MRN: [id number]  Date: 05/13/2015  DOB: 20-Sep-1976  Weekly Radiation Therapy Management  Diagnosis: ypT1c, ypN1a, stage IIA invasive ductal carcinoma of the left breast, grade 1, negative margins  Current Dose: 34.2 Gy     Planned Dose:  60.4 Gy  Narrative . . . . . . . . The patient presents for routine under treatment assessment.                                   The patient is without complaint.                                 Set-up films were reviewed.                                 The chart was checked. Physical Findings. . .  height is 5\' 6"  (1.676 m) and weight is 138 lb 1.6 oz (62.642 kg). Her oral temperature is 98 F (36.7 C). Her blood pressure is 117/78 and her pulse is 55. Her respiration is 12. . Weight essentially stable.  No significant changes. Impression . . . . . . . The patient is tolerating radiation. Plan . . . . . . . . . . . . Continue treatment as planned.  ________________________________   Blair Promise, PhD, MD

## 2015-05-13 NOTE — Patient Outreach (Signed)
Received a return phone call from [facility] member.  RN CM provided [employer] member with [facility] contact number to  Call for benefit exception request and someone with f/u with her on the process involved.   Also informed [employer] member a RN CM will be assigned to work on  benefit exception request.      Zara Chess.   [facility]  [phone]

## 2015-05-13 NOTE — Progress Notes (Addendum)
Nancy Austin has completed 19 fractions to her left breast.  She denies having pain and fatigue.  She reports having a poor appetite and diarrhea once a day that started last week.  She reports having itching on her left chest and upper back.  She is using biafine, hydrocortisone cream and benadryl at night.  The skin on her left subclavian area, upper back and chest is red.   BP 117/78 mmHg  Pulse 55  Temp(Src) 98 F (36.7 C) (Oral)  Resp 12  Ht 5\' 6"  (1.676 m)  Wt 138 lb 1.6 oz (62.642 kg)  BMI 22.30 kg/m2

## 2015-05-13 NOTE — Patient Outreach (Signed)
Telephone call (f/u on high cost claim, assess if any needs).  Spoke with [employer] member, HIPPA verified with name/date of birth.  Discussed with member the [facility] [employer] program, assessed if any needs. Member states she is having issues with getting my wig covered from chemo,the wigs they sell in [place] are not in network.   Member also states she is also have problems with compression sleeve (for lymphedema) being covered. Member states she was told could do a benefit exception.  RN CM informed member one of the services  of [employer] is doing benefit exception requests to which member was interested in.    RN CM to f/u with member to let her know the process for benefit exception request.       Nancy Austin.   [facility]  [phone]

## 2015-05-13 NOTE — Patient Outreach (Signed)
Follow up phone call to provide information on process for Benefit exception request.   Spoke with [employer] member who states is driving now, to call RN CM back.      Zara Chess.   [facility]  [phone]

## 2015-05-14 ENCOUNTER — Ambulatory Visit: Payer: 59 | Admitting: Oncology

## 2015-05-14 ENCOUNTER — Ambulatory Visit
Admission: RE | Admit: 2015-05-14 | Discharge: 2015-05-14 | Disposition: A | Discharge status: Home / Self Care | Payer: 59 | Source: Ambulatory Visit | Attending: Radiation Oncology | Admitting: Radiation Oncology

## 2015-05-14 DIAGNOSIS — Z510 Encounter for antineoplastic radiation therapy: Secondary | ICD-10-CM | POA: Diagnosis not present

## 2015-05-15 ENCOUNTER — Ambulatory Visit
Admission: RE | Admit: 2015-05-15 | Discharge: 2015-05-15 | Disposition: A | Discharge status: Home / Self Care | Payer: 59 | Source: Ambulatory Visit | Attending: Radiation Oncology | Admitting: Radiation Oncology

## 2015-05-15 DIAGNOSIS — Z510 Encounter for antineoplastic radiation therapy: Secondary | ICD-10-CM | POA: Diagnosis not present

## 2015-05-16 ENCOUNTER — Ambulatory Visit
Admission: RE | Admit: 2015-05-16 | Discharge: 2015-05-16 | Disposition: A | Discharge status: Home / Self Care | Payer: 59 | Source: Ambulatory Visit | Attending: Radiation Oncology | Admitting: Radiation Oncology

## 2015-05-16 DIAGNOSIS — Z510 Encounter for antineoplastic radiation therapy: Secondary | ICD-10-CM | POA: Diagnosis not present

## 2015-05-19 ENCOUNTER — Ambulatory Visit
Admission: RE | Admit: 2015-05-19 | Discharge: 2015-05-19 | Disposition: A | Discharge status: Home / Self Care | Payer: 59 | Source: Ambulatory Visit | Attending: Radiation Oncology | Admitting: Radiation Oncology

## 2015-05-19 DIAGNOSIS — Z510 Encounter for antineoplastic radiation therapy: Secondary | ICD-10-CM | POA: Diagnosis not present

## 2015-05-20 ENCOUNTER — Ambulatory Visit
Admission: RE | Admit: 2015-05-20 | Discharge: 2015-05-20 | Disposition: A | Discharge status: Home / Self Care | Payer: 59 | Source: Ambulatory Visit | Attending: Radiation Oncology | Admitting: Radiation Oncology

## 2015-05-20 ENCOUNTER — Encounter: Payer: Self-pay | Admitting: Radiation Oncology

## 2015-05-20 VITALS — BP 117/75 | HR 57 | Temp 98.7°F | Resp 16 | Ht 66.0 in | Wt 137.9 lb

## 2015-05-20 DIAGNOSIS — C50412 Malignant neoplasm of upper-outer quadrant of left female breast: Secondary | ICD-10-CM

## 2015-05-20 DIAGNOSIS — Z510 Encounter for antineoplastic radiation therapy: Secondary | ICD-10-CM | POA: Diagnosis not present

## 2015-05-20 NOTE — Progress Notes (Signed)
Nancy Austin has completed 24 fractions to her left breast and subclavian area.  She denies pain and fatigue.  The skin on her left chest/breast area is red.  Also her upper left back is red.  She is using biafine and reports the itching is a little less.  BP 117/75 mmHg  Pulse 57  Temp(Src) 98.7 F (37.1 C) (Oral)  Resp 16  Ht 5\' 6"  (1.676 m)  Wt 137 lb 14.4 oz (62.551 kg)  BMI 22.27 kg/m2

## 2015-05-20 NOTE — Progress Notes (Signed)
  Radiation Oncology         [phone] ________________________________  Name: Nancy Austin MRN: [id number]  Date: 05/20/2015  DOB: 01-27-1976  Weekly Radiation Therapy Management  Diagnosis: ypT1c, ypN1a, stage IIA invasive ductal carcinoma of the left breast, grade 1, negative margins  Current Dose: 43.2 Gy     Planned Dose:  60.4 Gy  Narrative . . . . . . . . The patient presents for routine under treatment assessment.                                   The patient is without complaint. She denies pain and fatigue.  She is using biafine and reports the itching is a little less.                                 Set-up films were reviewed.                                 The chart was checked. Physical Findings. . .  height is 5\' 6"  (1.676 m) and weight is 137 lb 14.4 oz (62.551 kg). Her oral temperature is 98.7 F (37.1 C). Her blood pressure is 117/75 and her pulse is 57. Her respiration is 16. . Weight essentially stable.  The skin on her left chest/breast area is red. Also her upper left back is red. No moist desquamation Impression . . . . . . . The patient is tolerating radiation. Plan . . . . . . . . . . . . Continue treatment as planned.  ________________________________   Blair Promise, PhD, MD

## 2015-05-21 ENCOUNTER — Ambulatory Visit
Admission: RE | Admit: 2015-05-21 | Discharge: 2015-05-21 | Disposition: A | Discharge status: Home / Self Care | Payer: 59 | Source: Ambulatory Visit | Attending: Radiation Oncology | Admitting: Radiation Oncology

## 2015-05-21 DIAGNOSIS — Z510 Encounter for antineoplastic radiation therapy: Secondary | ICD-10-CM | POA: Diagnosis not present

## 2015-05-22 ENCOUNTER — Encounter: Payer: Self-pay | Admitting: Radiation Oncology

## 2015-05-22 ENCOUNTER — Ambulatory Visit
Admission: RE | Admit: 2015-05-22 | Discharge: 2015-05-22 | Disposition: A | Discharge status: Home / Self Care | Payer: 59 | Source: Ambulatory Visit | Attending: Radiation Oncology | Admitting: Radiation Oncology

## 2015-05-22 DIAGNOSIS — Z510 Encounter for antineoplastic radiation therapy: Secondary | ICD-10-CM | POA: Diagnosis not present

## 2015-05-22 NOTE — Progress Notes (Signed)
  Radiation Oncology         (336) [phone] ________________________________  Name: Nancy Austin MRN: [id number]  Date:05/21/15  DOB: 10-22-1976  SIMULATION AND TREATMENT PLANNING NOTE for boost field    DIAGNOSIS:  ypT1c, ypN1a, stage IIA invasive ductal carcinoma of the left breast, grade 1  NARRATIVE:  The patient underwent additional planning for radiation therapy directed at the left breast. The patient's treatment planning CT scan was reviewed she had set up of a boost field directed at the lumpectomy cavity. The patient will be treated with a 3 field photon boost arrangement. A computerized isodose plan is requested for treatment  Then the target and avoidance structures were contoured.  Treatment planning then occurred.  The radiation prescription was entered and confirmed.  Then, I designed and supervised the construction of a total of 3 medically necessary complex treatment devices.  I have requested : Isodose Plan.  I have ordered:dose calc.  PLAN:  The patient will receive 10 Gy in 5 fractions directed at the lumpectomy cavity within the left breast.   ________________________________ -----------------------------------  Blair Promise, PhD, MD

## 2015-05-23 ENCOUNTER — Ambulatory Visit
Admission: RE | Admit: 2015-05-23 | Discharge: 2015-05-23 | Disposition: A | Discharge status: Home / Self Care | Payer: 59 | Source: Ambulatory Visit | Attending: Radiation Oncology | Admitting: Radiation Oncology

## 2015-05-23 DIAGNOSIS — Z510 Encounter for antineoplastic radiation therapy: Secondary | ICD-10-CM | POA: Diagnosis not present

## 2015-05-26 ENCOUNTER — Ambulatory Visit
Admission: RE | Admit: 2015-05-26 | Discharge: 2015-05-26 | Disposition: A | Discharge status: Home / Self Care | Payer: 59 | Source: Ambulatory Visit | Attending: Radiation Oncology | Admitting: Radiation Oncology

## 2015-05-26 DIAGNOSIS — Z510 Encounter for antineoplastic radiation therapy: Secondary | ICD-10-CM | POA: Diagnosis not present

## 2015-05-27 ENCOUNTER — Encounter: Payer: Self-pay | Admitting: Radiation Oncology

## 2015-05-27 ENCOUNTER — Ambulatory Visit (HOSPITAL_BASED_OUTPATIENT_CLINIC_OR_DEPARTMENT_OTHER): Payer: 59 | Admitting: Oncology

## 2015-05-27 ENCOUNTER — Ambulatory Visit: Payer: 59

## 2015-05-27 ENCOUNTER — Other Ambulatory Visit: Payer: 59

## 2015-05-27 ENCOUNTER — Other Ambulatory Visit (HOSPITAL_BASED_OUTPATIENT_CLINIC_OR_DEPARTMENT_OTHER): Payer: 59

## 2015-05-27 ENCOUNTER — Ambulatory Visit
Admission: RE | Admit: 2015-05-27 | Discharge: 2015-05-27 | Disposition: A | Discharge status: Home / Self Care | Payer: 59 | Source: Ambulatory Visit | Attending: Radiation Oncology | Admitting: Radiation Oncology

## 2015-05-27 ENCOUNTER — Ambulatory Visit (HOSPITAL_BASED_OUTPATIENT_CLINIC_OR_DEPARTMENT_OTHER): Payer: 59

## 2015-05-27 VITALS — BP 140/88 | HR 51 | Temp 98.0°F | Resp 18 | Ht 66.0 in | Wt 136.6 lb

## 2015-05-27 VITALS — BP 121/79 | HR 53 | Temp 97.9°F | Resp 16 | Ht 66.0 in | Wt 138.1 lb

## 2015-05-27 DIAGNOSIS — C50412 Malignant neoplasm of upper-outer quadrant of left female breast: Secondary | ICD-10-CM

## 2015-05-27 DIAGNOSIS — R51 Headache: Secondary | ICD-10-CM

## 2015-05-27 DIAGNOSIS — C50812 Malignant neoplasm of overlapping sites of left female breast: Secondary | ICD-10-CM | POA: Diagnosis not present

## 2015-05-27 DIAGNOSIS — Z5112 Encounter for antineoplastic immunotherapy: Secondary | ICD-10-CM | POA: Diagnosis not present

## 2015-05-27 DIAGNOSIS — Z510 Encounter for antineoplastic radiation therapy: Secondary | ICD-10-CM | POA: Diagnosis not present

## 2015-05-27 LAB — CBC WITH DIFFERENTIAL/PLATELET
BASO%: 0.5 % (ref 0.0–2.0)
Basophils Absolute: 0 10*3/uL (ref 0.0–0.1)
EOS ABS: 0 10*3/uL (ref 0.0–0.5)
EOS%: 1.5 % (ref 0.0–7.0)
HCT: 40.3 % (ref 34.8–46.6)
HGB: 13.5 g/dL (ref 11.6–15.9)
LYMPH%: 16 % (ref 14.0–49.7)
MCH: 29.9 pg (ref 25.1–34.0)
MCHC: 33.5 g/dL (ref 31.5–36.0)
MCV: 89.4 fL (ref 79.5–101.0)
MONO#: 0.2 10*3/uL (ref 0.1–0.9)
MONO%: 10 % (ref 0.0–14.0)
NEUT%: 72 % (ref 38.4–76.8)
NEUTROS ABS: 1.4 10*3/uL — AB (ref 1.5–6.5)
PLATELETS: 198 10*3/uL (ref 145–400)
RBC: 4.51 10*6/uL (ref 3.70–5.45)
RDW: 12.3 % (ref 11.2–14.5)
WBC: 2 10*3/uL — ABNORMAL LOW (ref 3.9–10.3)
lymph#: 0.3 10*3/uL — ABNORMAL LOW (ref 0.9–3.3)

## 2015-05-27 LAB — COMPREHENSIVE METABOLIC PANEL (CC13)
ALBUMIN: 3.9 g/dL (ref 3.5–5.0)
ALK PHOS: 57 U/L (ref 40–150)
ALT: 17 U/L (ref 0–55)
AST: 22 U/L (ref 5–34)
Anion Gap: 11 mEq/L (ref 3–11)
BUN: 8.6 mg/dL (ref 7.0–26.0)
CO2: 28 mEq/L (ref 22–29)
Calcium: 9.5 mg/dL (ref 8.4–10.4)
Chloride: 104 mEq/L (ref 98–109)
Creatinine: 0.7 mg/dL (ref 0.6–1.1)
EGFR: >90 mL/min/{1.73_m2} (ref >90)
GLUCOSE: 95 mg/dL (ref 70–140)
POTASSIUM: 3.8 meq/L (ref 3.5–5.1)
Sodium: 143 mEq/L (ref 136–145)
TOTAL PROTEIN: 6.9 g/dL (ref 6.4–8.3)
Total Bilirubin: 0.79 mg/dL (ref 0.20–1.20)

## 2015-05-27 MED ORDER — SODIUM CHLORIDE 0.9 % IV SOLN
Freq: Once | INTRAVENOUS | Status: AC
  Administered 2015-05-27: 10:00:00 via INTRAVENOUS

## 2015-05-27 MED ORDER — HEPARIN SOD (PORK) LOCK FLUSH 100 UNIT/ML IV SOLN
500.0000 [IU] | Freq: Once | INTRAVENOUS | Status: AC | PRN
  Administered 2015-05-27: 500 [IU]
  Filled 2015-05-27: qty 5

## 2015-05-27 MED ORDER — DIPHENHYDRAMINE HCL 25 MG PO CAPS
ORAL_CAPSULE | ORAL | Status: AC
  Filled 2015-05-27: qty 2

## 2015-05-27 MED ORDER — DIPHENHYDRAMINE HCL 25 MG PO CAPS
25.0000 mg | ORAL_CAPSULE | Freq: Once | ORAL | Status: AC
  Administered 2015-05-27: 25 mg via ORAL

## 2015-05-27 MED ORDER — ACETAMINOPHEN 325 MG PO TABS
ORAL_TABLET | ORAL | Status: AC
  Filled 2015-05-27: qty 2

## 2015-05-27 MED ORDER — ACETAMINOPHEN 325 MG PO TABS
650.0000 mg | ORAL_TABLET | Freq: Once | ORAL | Status: AC
  Administered 2015-05-27: 650 mg via ORAL

## 2015-05-27 MED ORDER — SODIUM CHLORIDE 0.9 % IJ SOLN
10.0000 mL | INTRAMUSCULAR | Status: DC | PRN
  Administered 2015-05-27: 10 mL
  Filled 2015-05-27: qty 10

## 2015-05-27 MED ORDER — TRASTUZUMAB CHEMO INJECTION 440 MG
4.0000 mg/kg | Freq: Once | INTRAVENOUS | Status: AC
  Administered 2015-05-27: 273 mg via INTRAVENOUS
  Filled 2015-05-27: qty 13

## 2015-05-27 NOTE — Progress Notes (Signed)
  Radiation Oncology         [phone] ________________________________  Name: Nancy Austin MRN: [id number]  Date: 05/27/2015  DOB: 06-17-1976  Simulation Verification Note  Status: outpatient  NARRATIVE: The patient was brought to the treatment unit and placed in the planned treatment position. The clinical setup was verified. Then port films were obtained and uploaded to the radiation oncology medical record software.  The treatment beams were carefully compared against the planned radiation fields. The position location and shape of the radiation fields was reviewed. They targeted volume of tissue appears to be appropriately covered by the radiation beams. Organs at risk appear to be excluded as planned.  Based on my personal review, I approved the simulation verification. The patient's treatment will proceed as planned.  -----------------------------------  Blair Promise, PhD, MD

## 2015-05-27 NOTE — Progress Notes (Signed)
Nancy Austin has completed 29 fractions to her left breast and subclavian area.  She denies pain but reports her left underarm area is sore.  She denies fatigue.  The skin on her left subclavian and breast is red.  She is using baifine cream.  She is going to have a herceptin injection today.  She has been given a one month follow up card.  BP 121/79 mmHg  Pulse 53  Temp(Src) 97.9 F (36.6 C) (Oral)  Resp 16  Ht 5\' 6"  (1.676 m)  Wt 138 lb 1.6 oz (62.642 kg)  BMI 22.30 kg/m2

## 2015-05-27 NOTE — Progress Notes (Signed)
  Radiation Oncology         (336) [phone] ________________________________  Name: Nancy Austin MRN: [id number]  Date: 05/27/2015  DOB: August 12, 1976  Weekly Radiation Therapy Management  Diagnosis: ypT1c, ypN1a, stage IIA invasive ductal carcinoma of the left breast, grade 1, negative margins  Current Dose: 52.4 Gy     Planned Dose:  60.4 Gy  Narrative . . . . . . . . The patient presents for routine under treatment assessment.                                   The patient does report some discomfort along the left lateral chest and low axillary region                                 Set-up films were reviewed.                                 The chart was checked. Physical Findings. . .  height is 5\' 6"  (1.676 m) and weight is 138 lb 1.6 oz (62.642 kg). Her oral temperature is 97.9 F (36.6 C). Her blood pressure is 121/79 and her pulse is 53. Her respiration is 16. . The lungs are clear. The heart has regular rhythm and rate. Examination of the left breast area reveals erythema and hyperpigmentation changes. No skin breakdown is appreciated. Impression . . . . . . . The patient is tolerating radiation. Plan . . . . . . . . . . . . Continue treatment as planned.  ________________________________   Blair Promise, PhD, MD

## 2015-05-27 NOTE — Patient Instructions (Signed)
[facility] Discharge Instructions for Patients Receiving Chemotherapy  Today you received the following chemotherapy agents Herceptin  To help prevent nausea and vomiting after your treatment, we encourage you to take your nausea medication    If you develop nausea and vomiting that is not controlled by your nausea medication, call the clinic.   BELOW ARE SYMPTOMS THAT SHOULD BE REPORTED IMMEDIATELY:  *FEVER GREATER THAN 100.5 F  *CHILLS WITH OR WITHOUT FEVER  NAUSEA AND VOMITING THAT IS NOT CONTROLLED WITH YOUR NAUSEA MEDICATION  *UNUSUAL SHORTNESS OF BREATH  *UNUSUAL BRUISING OR BLEEDING  TENDERNESS IN MOUTH AND THROAT WITH OR WITHOUT PRESENCE OF ULCERS  *URINARY PROBLEMS  *BOWEL PROBLEMS  UNUSUAL RASH Items with * indicate a potential emergency and should be followed up as soon as possible.  Feel free to call the clinic you have any questions or concerns. The clinic phone number is [phone].  Please show the CHEMO ALERT CARD at check-in to the Emergency Department and triage nurse.   

## 2015-05-27 NOTE — Progress Notes (Signed)
[facility]  [phone] [phone]     ID: Nancy Austin DOB: 07/20/1976  MR#: [id number]  ENI#:[id number]  Patient Care Team: Paula Compton, MD as PCP - General (Obstetrics and Gynecology) Rolm Bookbinder, MD as Consulting Physician (General Surgery) Gery Pray, MD as Consulting Physician (Radiation Oncology) Chauncey Cruel, MD as Consulting Physician (Oncology) OTHER MD:  CHIEF COMPLAINT: Triple positive breast cancer  CURRENT TREATMENT: Adjuvant radiation pending; continuing anti-HER-2 immunotherapy; on goserelin   BREAST CANCER HISTORY: From the original Intake note:  Nancy Austin herself found a lump in her left breast early October 2015 and brought it to her gynecologist attention. On 09/20/2014 she was set up for bilateral diagnostic mammography and left breast ultrasonography of the breast Center. This was the patient's first ever mammogram. In the area of concern in the left breast there was suspicious pleomorphic calcifications spanning 1.1 cm. There was no discrete mammographic mass in this dense breasts (category C.). On physical exam, there was a palpable firm at 1.5 cm mass at the 3:00 position in the left breast. By ultrasound this was irregular and hypoechoic and measured 1.8 cm. Ultrasound of the left axilla was unremarkable. Aside from multiple cysts in the left breast there were no other findings of concern.  On the same day, 09/20/2014, the patient underwent biopsy of the left breast palpable mass. This showed ([phone]) and invasive ductal carcinoma, grade 2, estrogen receptor 100% positive, progesterone receptor 89% positive, both with strong staining intensity, with an MIB-1 of 16%. HER-2 was amplified with a signals ratio of 5.07 and a copy number per cell of 6.85. Paragraph on 09/30/2014 the patient underwent bilateral breast MRIs. This showed a 1.7 cm mass in the upper outer quadrant of the left breast. There was no other  suspicious finding in either breast and no abnormal appearing adenopathy.  The patient's subsequent history is as detailed below.  INTERVAL HISTORY: Nancy Austin teturns today for follow-up of her breast cancer accompanied by her mother. She is generally doing well with her radiation treatments, which she will complete next week. She did have an itchy rash for which he took Benadryl, but that is little bit better. She is also tolerating the goserelin well. She has some hot flashes, "not terrible". The gabapentin at bedtime does help. Vaginal dryness is not an issue. She occasionally has insomnia problems, but then she sleeps better the following night. There has not been any weight gain and in fact her weight is very stable as compared to baseline.   REVIEW OF SYSTEMS: She just started back on running. She is having occasional headaches. Of course she is having no periods. Aside from these issues, a detailed review of systems today was otherwise noncontributory.  PAST MEDICAL HISTORY: Past Medical History  Diagnosis Date  . Breast cancer of upper-outer quadrant of left female breast 09/24/2014  . History of chemotherapy     finished 02/10/2015    PAST SURGICAL HISTORY: Past Surgical History  Procedure Laterality Date  . Tonsillectomy  1996  . Portacath placement N/A 10/15/2014    Procedure: INSERTION PORT-A-CATH;  Surgeon: Rolm Bookbinder, MD;  Location: [facility];  Service: General;  Laterality: N/A;  . Radioactive seed guided mastectomy with axillary sentinel lymph node biopsy Left 03/06/2015    Procedure: RADIOACTIVE SEED GUIDED PARTIAL MASTECTOMY WITH AXILLARY SENTINEL LYMPH NODE BIOPSY;  Surgeon: Rolm Bookbinder, MD;  Location: [facility];  Service: General;  Laterality: Left;    FAMILY HISTORY Family  History  Problem Relation Age of Onset  . Skin cancer Father   . Prostate cancer Maternal Uncle 66    currently 20  . Multiple myeloma Paternal Grandmother 74     Deceased 39  The patient's parents are both living. The patient has one brother, no sisters. One grandmother was diagnosed with multiple myeloma at age 18. There is no history of breast or ovarian cancer in the family.   GYNECOLOGIC HISTORY:  No LMP recorded.  menarche age 83, first live birth age 37. The patient is GX P2. She was still having regular periods at the start of chemotherapy. She was on birth control pills on and off for the last 15 years, stopping in October of 2015.   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 Her husband Rolla Plate works as a Immunologist at [employer]. Their children are Sam age 29 and Terrence Dupont age 64.    ADVANCED DIRECTIVES: In place   HEALTH MAINTENANCE: History  Substance Use Topics  . Smoking status: Never Smoker   . Smokeless tobacco: Never Used  . Alcohol Use: Yes     Comment: 1-3 drinks/week     Colonoscopy:  PAP:  November 2014  Bone density:  Lipid panel:  Allergies  Allergen Reactions  . Morphine And Related Nausea And Vomiting  . Tegaderm Ag Mesh [Silver] Rash    Current Outpatient Prescriptions  Medication Sig Dispense Refill  . diphenhydrAMINE (BENADRYL) 25 MG tablet Take 25 mg by mouth every 6 (six) hours as needed.    Marland Kitchen emollient (BIAFINE) cream Apply 1 application topically 2 (two) times daily.    Marland Kitchen gabapentin (NEURONTIN) 300 MG capsule TAKE 1 CAPSULE BY MOUTH AT BEDTIME 30 capsule 3  . hyaluronate sodium (RADIAPLEXRX) GEL Apply 1 application topically 2 (two) times daily.    . Multiple Vitamin (MULTIVITAMIN) tablet Take 1 tablet by mouth daily.    . non-metallic deodorant Jethro Poling) MISC Apply 1 application topically daily as needed.     No current facility-administered medications for this visit.    OBJECTIVE: young white woman in no acute distress Filed Vitals:   05/27/15 0932  BP: 140/88  Pulse: 51  Temp: 98 F (36.7 C)  Resp: 18     Body mass index is 22.06 kg/(m^2).    ECOG FS:0 -  Asymptomatic Baseline weight was 142 pounds, current weight and 138 pounds.  Sclerae unicteric, EOMs intact Oropharynx clear, dentition in good repair No cervical or supraclavicular adenopathy Lungs no rales or rhonchi Heart regular rate and rhythm Abd soft, nontender, positive bowel sounds MSK no focal spinal tenderness, no upper extremity lymphedema Neuro: nonfocal, well oriented, appropriate affect Breasts: The right breast is unremarkable. The left breast is status post lumpectomy and is currently receiving radiation. There is hyperpigmentation over the radiation port area but no desquamation. There is no evidence of residual or recurrent disease. Left axilla is benign.   LAB RESULTS:  CMP     Component Value Date/Time   NA 142 05/06/2015 0847   NA 144 10/02/2014 1206   K 3.7 05/06/2015 0847   K 3.9 10/02/2014 1206   CL 104 10/02/2014 1206   CO2 27 05/06/2015 0847   CO2 26 10/02/2014 1206   GLUCOSE 94 05/06/2015 0847   GLUCOSE 103* 10/02/2014 1206   BUN 8.7 05/06/2015 0847   BUN 11 10/02/2014 1206   CREATININE 0.8 05/06/2015 0847   CREATININE 0.87 10/02/2014 1206   CALCIUM 9.2  05/06/2015 0847   CALCIUM 9.4 10/02/2014 1206   PROT 6.8 05/06/2015 0847   PROT 7.3 10/02/2014 1206   ALBUMIN 4.1 05/06/2015 0847   ALBUMIN 3.9 10/02/2014 1206   AST 25 05/06/2015 0847   AST 20 10/02/2014 1206   ALT 22 05/06/2015 0847   ALT 16 10/02/2014 1206   ALKPHOS 60 05/06/2015 0847   ALKPHOS 45 10/02/2014 1206   BILITOT 0.67 05/06/2015 0847   BILITOT 0.4 10/02/2014 1206    I No results found for: SPEP  Lab Results  Component Value Date   WBC 2.0* 05/27/2015   NEUTROABS 1.4* 05/27/2015   HGB 13.5 05/27/2015   HCT 40.3 05/27/2015   MCV 89.4 05/27/2015   PLT 198 05/27/2015      Chemistry      Component Value Date/Time   NA 142 05/06/2015 0847   NA 144 10/02/2014 1206   K 3.7 05/06/2015 0847   K 3.9 10/02/2014 1206   CL 104 10/02/2014 1206   CO2 27 05/06/2015 0847    CO2 26 10/02/2014 1206   BUN 8.7 05/06/2015 0847   BUN 11 10/02/2014 1206   CREATININE 0.8 05/06/2015 0847   CREATININE 0.87 10/02/2014 1206      Component Value Date/Time   CALCIUM 9.2 05/06/2015 0847   CALCIUM 9.4 10/02/2014 1206   ALKPHOS 60 05/06/2015 0847   ALKPHOS 45 10/02/2014 1206   AST 25 05/06/2015 0847   AST 20 10/02/2014 1206   ALT 22 05/06/2015 0847   ALT 16 10/02/2014 1206   BILITOT 0.67 05/06/2015 0847   BILITOT 0.4 10/02/2014 1206       No results found for: LABCA2  No components found for: LABCA125  No results for input(s): INR in the last 168 hours.  Urinalysis No results found for: COLORURINE  STUDIES:            ------------------------------------------------------------------- Transthoracic Echocardiography  Patient:  Nancy Austin, Nancy Austin MR #:    [id number] Study Date: 04/17/2015 Gender:   F Age:    39 Height:   167.6 cm Weight:   64.4 kg BSA:    1.74 m^2 Pt. Status: Room:  ATTENDING  Shalandra Leu, Valli Glance   Isaack Preble, Virgie Dad REFERRING  Adley Mazurowski, Virgie Dad PERFORMING  [facility], Outpatient SONOGRAPHER Roseanna Rainbow  cc:  ------------------------------------------------------------------- LV EF: 55% -  60%    ASSESSMENT: 39 y.o. BRCA negative [place] woman s/p Left breast upper outer quadrant biopsy 09/20/2014, for a clinical T1c N0, stage IA invasive ductal carcinoma, grade 2, estrogen and progesterone receptor positive, HER-2 amplified with a signal is ratio of 5.07, and an MIB-1 of 16%  (1) neoadjuvant treatment consisting of carboplatin, docetaxel, trastuzumab and pertuzumab given every 21 days x6,  completed 02/10/2015  (a) breast MRI 02/11/2015 showed a complete radiologic response  (2) left lumpectomy and sentinel lymph node sampling 03/06/2015 showed a residual ypT1c ypN1a, stage IIA invasive ductal carcinoma, grade 1, with repeat prognostic panel still triple positive, and negative  margins  (3) trastuzumab to be continued to total one year (through October 2016)  (a) most recent echo 04/17/2015 shows an EF of 55-60%  (4) adjuvant radiation to be completed 06/02/2015  (5) antiestrogens to follow radiation  (a) goserelin started 04/03/2015  (b) to start tamoxifen 06/26/2015  (6)  the [facility]Next geneprofile  ([facility] genetics) obtained November 2015 did not reveal a mutation in ATM, BARD1, BRCA1, BRCA BRIP1, CDH1, CHEK2, MRE11A, MUTYH, NBN, NF1, PALB2, PTEN, RAD50, RAD51C, RAD51D, or TP53  PLAN: Danyla is doing well with the radiation, which she will complete next week. She has a good understanding of that if she keeps up her exercise program she will get out of the radiation associated fatigue occur the skin changes also will resolve rapidly.  She will be ready to start anti-estrogens about a month from now. The data for patients like her is controversial. We have data that aromatase inhibitors are superior to tamoxifen, but we have a separate study that shows a superior survival of tamoxifen or aromatase inhibitors in this setting. After much discussion and chiefly because of concern regarding bone density issues we are going to go with tamoxifen.   She will start July 14. I'm going to see her late August, just to make sure she is tolerating this well. She will be due for a repeat echocardiogram before then  [name] has a good understanding of the overall plan. She agrees with it. She will let us know if any problems develop before her next visit here.  Chauncey Cruel, MD   05/27/2015 9:37 AM

## 2015-05-28 ENCOUNTER — Ambulatory Visit
Admission: RE | Admit: 2015-05-28 | Discharge: 2015-05-28 | Disposition: A | Discharge status: Home / Self Care | Payer: 59 | Source: Ambulatory Visit | Attending: Radiation Oncology | Admitting: Radiation Oncology

## 2015-05-28 ENCOUNTER — Telehealth: Payer: Self-pay | Admitting: Oncology

## 2015-05-28 DIAGNOSIS — Z510 Encounter for antineoplastic radiation therapy: Secondary | ICD-10-CM | POA: Diagnosis not present

## 2015-05-28 NOTE — Telephone Encounter (Signed)
Left message to confirm appointment for ECHO. °

## 2015-05-29 ENCOUNTER — Ambulatory Visit
Admission: RE | Admit: 2015-05-29 | Discharge: 2015-05-29 | Disposition: A | Discharge status: Home / Self Care | Payer: 59 | Source: Ambulatory Visit | Attending: Radiation Oncology | Admitting: Radiation Oncology

## 2015-05-29 ENCOUNTER — Ambulatory Visit (HOSPITAL_BASED_OUTPATIENT_CLINIC_OR_DEPARTMENT_OTHER): Payer: 59

## 2015-05-29 VITALS — BP 124/84 | HR 55 | Temp 98.1°F

## 2015-05-29 DIAGNOSIS — C50812 Malignant neoplasm of overlapping sites of left female breast: Secondary | ICD-10-CM

## 2015-05-29 DIAGNOSIS — Z510 Encounter for antineoplastic radiation therapy: Secondary | ICD-10-CM | POA: Diagnosis not present

## 2015-05-29 DIAGNOSIS — Z5111 Encounter for antineoplastic chemotherapy: Secondary | ICD-10-CM

## 2015-05-29 DIAGNOSIS — C50412 Malignant neoplasm of upper-outer quadrant of left female breast: Secondary | ICD-10-CM

## 2015-05-29 MED ORDER — GOSERELIN ACETATE 3.6 MG ~~LOC~~ IMPL
3.6000 mg | DRUG_IMPLANT | SUBCUTANEOUS | Status: DC
  Administered 2015-05-29: 3.6 mg via SUBCUTANEOUS
  Filled 2015-05-29: qty 3.6

## 2015-05-30 ENCOUNTER — Ambulatory Visit
Admission: RE | Admit: 2015-05-30 | Discharge: 2015-05-30 | Disposition: A | Discharge status: Home / Self Care | Payer: 59 | Source: Ambulatory Visit | Attending: Radiation Oncology | Admitting: Radiation Oncology

## 2015-05-30 DIAGNOSIS — Z510 Encounter for antineoplastic radiation therapy: Secondary | ICD-10-CM | POA: Diagnosis not present

## 2015-06-02 ENCOUNTER — Ambulatory Visit
Admission: RE | Admit: 2015-06-02 | Discharge: 2015-06-02 | Disposition: A | Discharge status: Home / Self Care | Payer: 59 | Source: Ambulatory Visit | Attending: Radiation Oncology | Admitting: Radiation Oncology

## 2015-06-02 ENCOUNTER — Encounter: Payer: Self-pay | Admitting: Radiation Oncology

## 2015-06-02 VITALS — BP 130/88 | HR 63 | Temp 98.2°F | Resp 12 | Wt 137.5 lb

## 2015-06-02 DIAGNOSIS — C50412 Malignant neoplasm of upper-outer quadrant of left female breast: Secondary | ICD-10-CM

## 2015-06-02 DIAGNOSIS — Z510 Encounter for antineoplastic radiation therapy: Secondary | ICD-10-CM | POA: Diagnosis not present

## 2015-06-02 NOTE — Progress Notes (Signed)
   Department of Radiation Oncology  Phone:  [phone] Fax:        [phone]  Weekly Treatment Note    Name: Nancy Austin Date: 06/02/2015 MRN: [id number] DOB: 1976-12-03   Current dose: 60.4 Gy  Current fraction: 33   MEDICATIONS: Current Outpatient Prescriptions  Medication Sig Dispense Refill  . diphenhydrAMINE (BENADRYL) 25 MG tablet Take 25 mg by mouth every 6 (six) hours as needed.    Marland Kitchen emollient (BIAFINE) cream Apply 1 application topically 2 (two) times daily.    Marland Kitchen gabapentin (NEURONTIN) 300 MG capsule TAKE 1 CAPSULE BY MOUTH AT BEDTIME 30 capsule 3  . hyaluronate sodium (RADIAPLEXRX) GEL Apply 1 application topically 2 (two) times daily.    . Multiple Vitamin (MULTIVITAMIN) tablet Take 1 tablet by mouth daily.    . non-metallic deodorant Jethro Poling) MISC Apply 1 application topically daily as needed.     No current facility-administered medications for this encounter.     ALLERGIES: Morphine and related and Tegaderm ag mesh   LABORATORY DATA:  Lab Results  Component Value Date   WBC 2.0* 05/27/2015   HGB 13.5 05/27/2015   HCT 40.3 05/27/2015   MCV 89.4 05/27/2015   PLT 198 05/27/2015   Lab Results  Component Value Date   NA 143 05/27/2015   K 3.8 05/27/2015   CL 104 10/02/2014   CO2 28 05/27/2015   Lab Results  Component Value Date   ALT 17 05/27/2015   AST 22 05/27/2015   ALKPHOS 57 05/27/2015   BILITOT 0.79 05/27/2015     NARRATIVE: Nancy Austin was seen today for weekly treatment management. The chart was checked and the patient's films were reviewed. She is currently in no pain.  Pt left breast- positive for Dryness, Hyperpigmentation, erythema and breast tenderness. Pt reports edema over left upper extremity. Reports wearing a sleeve and performing exercises to the left arm.  Pt continues to apply Biafine as directed. Pt complains of fatigue.  PHYSICAL EXAMINATION: weight is 137 lb 8 oz (62.37 kg). Her oral temperature is 98.2 F (36.8  C). Her blood pressure is 130/88 and her pulse is 63. Her respiration is 12 and oxygen saturation is 100%.      Skin: Dry desquamation in treatment area.  ASSESSMENT: The patient did doing satisfactorily with treatment.  PLAN:  Continue with skin cream use for several weeks. Follow-up with Dr. Sondra Come in 1 month.       This document serves as a record of services personally performed by Kyung Rudd, MD. It was created on his behalf by Derek Mound, a trained medical scribe. The creation of this record is based on the scribe's personal observations and the provider's statements to them. This document has been checked and approved by the attending provider.

## 2015-06-02 NOTE — Progress Notes (Signed)
PAIN: She is currently in no pain.  SKIN: Pt left breast- positive for Dryness, Hyperpigmentation, erythema and breast tenderness.  Pt reports edema over left upper extremity. Reports wearing a sleeve and performing exercises to the left arm.  Pt continues to apply Biafine as directed. OTHER: Pt complains of fatigue. BP 130/88 mmHg  Pulse 63  Temp(Src) 98.2 F (36.8 C) (Oral)  Resp 12  Wt 137 lb 8 oz (62.37 kg)  SpO2 100%

## 2015-06-09 NOTE — Progress Notes (Signed)
  Radiation Oncology         [phone] ________________________________  Name: Nancy Austin MRN: [id number]  Date: 06/02/2015  DOB: 13-Sep-1976  End of Treatment Note    ICD-9-CM ICD-10-CM   1. Breast cancer of upper-outer quadrant of left female breast 174.4 C50.412     DIAGNOSIS: ypT1c ypN1a, stage IIA invasive ductal carcinoma of the left breast, grade 1, negative margins     Indication for treatment:  Breast conservation therapy and elective coverage of the axillary/supraclavicular region       Radiation treatment dates:   04/16/2015-06/02/2015  Site/dose:   Left breast 50.4 gray in 28 fractions, axillary/supraclavicular region 45 gray in 25 fractions, lumpectomy boost 10 gray in 5 fractions  Beams/energy:   Patient was treated with 3-D conformal therapy for her whole breast radiation therapy and axillary coverage. The patient underwent a 3 field photon boost for her lumpectomy cavity boost in light of the depth within the breast area.  Narrative: The patient tolerated radiation treatment relatively well.   The patient developed some fatigue towards the end of therapy. She also developed some mild itching/discomfort within the breast and axillary region. She developed dry desquamation but no moist desquamation within the treatment area.  Plan: The patient has completed radiation treatment. The patient will return to radiation oncology clinic for routine followup in one month. I advised them to call or return sooner if they have any questions or concerns related to their recovery or treatment.  -----------------------------------  Blair Promise, PhD, MD

## 2015-06-10 ENCOUNTER — Other Ambulatory Visit (HOSPITAL_BASED_OUTPATIENT_CLINIC_OR_DEPARTMENT_OTHER): Payer: 59

## 2015-06-10 ENCOUNTER — Ambulatory Visit (HOSPITAL_BASED_OUTPATIENT_CLINIC_OR_DEPARTMENT_OTHER): Payer: 59

## 2015-06-10 VITALS — BP 107/75 | HR 55 | Temp 98.2°F | Resp 16

## 2015-06-10 DIAGNOSIS — Z5112 Encounter for antineoplastic immunotherapy: Secondary | ICD-10-CM | POA: Diagnosis not present

## 2015-06-10 DIAGNOSIS — C773 Secondary and unspecified malignant neoplasm of axilla and upper limb lymph nodes: Secondary | ICD-10-CM | POA: Diagnosis not present

## 2015-06-10 DIAGNOSIS — C50412 Malignant neoplasm of upper-outer quadrant of left female breast: Secondary | ICD-10-CM

## 2015-06-10 DIAGNOSIS — C50812 Malignant neoplasm of overlapping sites of left female breast: Secondary | ICD-10-CM | POA: Diagnosis not present

## 2015-06-10 LAB — CBC WITH DIFFERENTIAL/PLATELET
BASO%: 0.6 % (ref 0.0–2.0)
Basophils Absolute: 0 10*3/uL (ref 0.0–0.1)
EOS%: 2.2 % (ref 0.0–7.0)
Eosinophils Absolute: 0.1 10*3/uL (ref 0.0–0.5)
HCT: 38.4 % (ref 34.8–46.6)
HGB: 12.9 g/dL (ref 11.6–15.9)
LYMPH#: 0.4 10*3/uL — AB (ref 0.9–3.3)
LYMPH%: 15 % (ref 14.0–49.7)
MCH: 29.3 pg (ref 25.1–34.0)
MCHC: 33.5 g/dL (ref 31.5–36.0)
MCV: 87.4 fL (ref 79.5–101.0)
MONO#: 0.3 10*3/uL (ref 0.1–0.9)
MONO%: 9.7 % (ref 0.0–14.0)
NEUT#: 1.9 10*3/uL (ref 1.5–6.5)
NEUT%: 72.5 % (ref 38.4–76.8)
PLATELETS: 207 10*3/uL (ref 145–400)
RBC: 4.4 10*6/uL (ref 3.70–5.45)
RDW: 12.3 % (ref 11.2–14.5)
WBC: 2.7 10*3/uL — AB (ref 3.9–10.3)

## 2015-06-10 LAB — COMPREHENSIVE METABOLIC PANEL (CC13)
ALK PHOS: 65 U/L (ref 40–150)
ALT: 16 U/L (ref 0–55)
ANION GAP: 7 meq/L (ref 3–11)
AST: 21 U/L (ref 5–34)
Albumin: 3.9 g/dL (ref 3.5–5.0)
BUN: 8.4 mg/dL (ref 7.0–26.0)
CO2: 28 meq/L (ref 22–29)
Calcium: 9.5 mg/dL (ref 8.4–10.4)
Chloride: 105 mEq/L (ref 98–109)
Creatinine: 0.7 mg/dL (ref 0.6–1.1)
Glucose: 82 mg/dl (ref 70–140)
Potassium: 3.8 mEq/L (ref 3.5–5.1)
Sodium: 140 mEq/L (ref 136–145)
TOTAL PROTEIN: 6.7 g/dL (ref 6.4–8.3)
Total Bilirubin: 0.85 mg/dL (ref 0.20–1.20)

## 2015-06-10 MED ORDER — ACETAMINOPHEN 325 MG PO TABS
650.0000 mg | ORAL_TABLET | Freq: Once | ORAL | Status: AC
  Administered 2015-06-10: 650 mg via ORAL

## 2015-06-10 MED ORDER — SODIUM CHLORIDE 0.9 % IJ SOLN
10.0000 mL | INTRAMUSCULAR | Status: DC | PRN
  Administered 2015-06-10: 10 mL
  Filled 2015-06-10: qty 10

## 2015-06-10 MED ORDER — DIPHENHYDRAMINE HCL 25 MG PO CAPS
25.0000 mg | ORAL_CAPSULE | Freq: Once | ORAL | Status: AC
  Administered 2015-06-10: 25 mg via ORAL

## 2015-06-10 MED ORDER — SODIUM CHLORIDE 0.9 % IV SOLN
Freq: Once | INTRAVENOUS | Status: AC
  Administered 2015-06-10: 10:00:00 via INTRAVENOUS

## 2015-06-10 MED ORDER — DIPHENHYDRAMINE HCL 25 MG PO CAPS
ORAL_CAPSULE | ORAL | Status: AC
  Filled 2015-06-10: qty 1

## 2015-06-10 MED ORDER — HEPARIN SOD (PORK) LOCK FLUSH 100 UNIT/ML IV SOLN
500.0000 [IU] | Freq: Once | INTRAVENOUS | Status: AC | PRN
  Administered 2015-06-10: 500 [IU]
  Filled 2015-06-10: qty 5

## 2015-06-10 MED ORDER — ACETAMINOPHEN 325 MG PO TABS
ORAL_TABLET | ORAL | Status: AC
  Filled 2015-06-10: qty 2

## 2015-06-10 MED ORDER — TRASTUZUMAB CHEMO INJECTION 440 MG
6.0000 mg/kg | Freq: Once | INTRAVENOUS | Status: AC
  Administered 2015-06-10: 399 mg via INTRAVENOUS
  Filled 2015-06-10: qty 19

## 2015-06-10 NOTE — Patient Instructions (Signed)
[facility] Discharge Instructions for Patients Receiving Chemotherapy  Today you received the following chemotherapy agents: Herceptin   To help prevent nausea and vomiting after your treatment, we encourage you to take your nausea medication as directed.    If you develop nausea and vomiting that is not controlled by your nausea medication, call the clinic.   BELOW ARE SYMPTOMS THAT SHOULD BE REPORTED IMMEDIATELY:  *FEVER GREATER THAN 100.5 F  *CHILLS WITH OR WITHOUT FEVER  NAUSEA AND VOMITING THAT IS NOT CONTROLLED WITH YOUR NAUSEA MEDICATION  *UNUSUAL SHORTNESS OF BREATH  *UNUSUAL BRUISING OR BLEEDING  TENDERNESS IN MOUTH AND THROAT WITH OR WITHOUT PRESENCE OF ULCERS  *URINARY PROBLEMS  *BOWEL PROBLEMS  UNUSUAL RASH Items with * indicate a potential emergency and should be followed up as soon as possible.  Feel free to call the clinic you have any questions or concerns. The clinic phone number is [phone].  Please show the CHEMO ALERT CARD at check-in to the Emergency Department and triage nurse.   

## 2015-06-11 ENCOUNTER — Other Ambulatory Visit: Payer: Self-pay | Admitting: Oncology

## 2015-06-11 DIAGNOSIS — C50919 Malignant neoplasm of unspecified site of unspecified female breast: Secondary | ICD-10-CM

## 2015-06-17 ENCOUNTER — Ambulatory Visit: Payer: 59

## 2015-06-17 ENCOUNTER — Other Ambulatory Visit: Payer: 59

## 2015-07-01 ENCOUNTER — Other Ambulatory Visit (HOSPITAL_BASED_OUTPATIENT_CLINIC_OR_DEPARTMENT_OTHER): Payer: 59

## 2015-07-01 ENCOUNTER — Ambulatory Visit (HOSPITAL_BASED_OUTPATIENT_CLINIC_OR_DEPARTMENT_OTHER): Payer: 59

## 2015-07-01 VITALS — BP 107/73 | HR 60 | Temp 97.7°F | Resp 17

## 2015-07-01 DIAGNOSIS — C50812 Malignant neoplasm of overlapping sites of left female breast: Secondary | ICD-10-CM

## 2015-07-01 DIAGNOSIS — Z5112 Encounter for antineoplastic immunotherapy: Secondary | ICD-10-CM | POA: Diagnosis not present

## 2015-07-01 DIAGNOSIS — C773 Secondary and unspecified malignant neoplasm of axilla and upper limb lymph nodes: Secondary | ICD-10-CM | POA: Diagnosis not present

## 2015-07-01 DIAGNOSIS — C50412 Malignant neoplasm of upper-outer quadrant of left female breast: Secondary | ICD-10-CM

## 2015-07-01 LAB — COMPREHENSIVE METABOLIC PANEL (CC13)
ALT: 12 U/L (ref 0–55)
AST: 20 U/L (ref 5–34)
Albumin: 3.9 g/dL (ref 3.5–5.0)
Alkaline Phosphatase: 63 U/L (ref 40–150)
Anion Gap: 7 mEq/L (ref 3–11)
BILIRUBIN TOTAL: 0.4 mg/dL (ref 0.20–1.20)
BUN: 13.5 mg/dL (ref 7.0–26.0)
CALCIUM: 9.7 mg/dL (ref 8.4–10.4)
CHLORIDE: 106 meq/L (ref 98–109)
CO2: 29 mEq/L (ref 22–29)
CREATININE: 0.7 mg/dL (ref 0.6–1.1)
EGFR: >90 mL/min/{1.73_m2} (ref >90)
GLUCOSE: 85 mg/dL (ref 70–140)
Potassium: 4.2 mEq/L (ref 3.5–5.1)
Sodium: 142 mEq/L (ref 136–145)
Total Protein: 6.7 g/dL (ref 6.4–8.3)

## 2015-07-01 LAB — CBC WITH DIFFERENTIAL/PLATELET
BASO%: 1.2 % (ref 0.0–2.0)
Basophils Absolute: 0 10*3/uL (ref 0.0–0.1)
EOS%: 2.3 % (ref 0.0–7.0)
Eosinophils Absolute: 0.1 10*3/uL (ref 0.0–0.5)
HCT: 37.4 % (ref 34.8–46.6)
HEMOGLOBIN: 12.6 g/dL (ref 11.6–15.9)
LYMPH%: 23.1 % (ref 14.0–49.7)
MCH: 29.7 pg (ref 25.1–34.0)
MCHC: 33.7 g/dL (ref 31.5–36.0)
MCV: 88.2 fL (ref 79.5–101.0)
MONO#: 0.2 10*3/uL (ref 0.1–0.9)
MONO%: 8.5 % (ref 0.0–14.0)
NEUT#: 1.6 10*3/uL (ref 1.5–6.5)
NEUT%: 64.9 % (ref 38.4–76.8)
Platelets: 220 10*3/uL (ref 145–400)
RBC: 4.24 10*6/uL (ref 3.70–5.45)
RDW: 13.4 % (ref 11.2–14.5)
WBC: 2.5 10*3/uL — ABNORMAL LOW (ref 3.9–10.3)
lymph#: 0.6 10*3/uL — ABNORMAL LOW (ref 0.9–3.3)

## 2015-07-01 MED ORDER — SODIUM CHLORIDE 0.9 % IV SOLN
Freq: Once | INTRAVENOUS | Status: AC
  Administered 2015-07-01: 09:00:00 via INTRAVENOUS

## 2015-07-01 MED ORDER — ACETAMINOPHEN 325 MG PO TABS
ORAL_TABLET | ORAL | Status: AC
  Filled 2015-07-01: qty 2

## 2015-07-01 MED ORDER — GOSERELIN ACETATE 3.6 MG ~~LOC~~ IMPL
3.6000 mg | DRUG_IMPLANT | SUBCUTANEOUS | Status: DC
  Administered 2015-07-01: 3.6 mg via SUBCUTANEOUS
  Filled 2015-07-01: qty 3.6

## 2015-07-01 MED ORDER — ACETAMINOPHEN 325 MG PO TABS
650.0000 mg | ORAL_TABLET | Freq: Once | ORAL | Status: AC
  Administered 2015-07-01: 650 mg via ORAL

## 2015-07-01 MED ORDER — DIPHENHYDRAMINE HCL 25 MG PO CAPS
25.0000 mg | ORAL_CAPSULE | Freq: Once | ORAL | Status: AC
  Administered 2015-07-01: 25 mg via ORAL

## 2015-07-01 MED ORDER — DIPHENHYDRAMINE HCL 25 MG PO CAPS
ORAL_CAPSULE | ORAL | Status: AC
  Filled 2015-07-01: qty 1

## 2015-07-01 MED ORDER — TRASTUZUMAB CHEMO INJECTION 440 MG
6.0000 mg/kg | Freq: Once | INTRAVENOUS | Status: AC
  Administered 2015-07-01: 399 mg via INTRAVENOUS
  Filled 2015-07-01: qty 19

## 2015-07-01 MED ORDER — HEPARIN SOD (PORK) LOCK FLUSH 100 UNIT/ML IV SOLN
500.0000 [IU] | Freq: Once | INTRAVENOUS | Status: AC | PRN
  Administered 2015-07-01: 500 [IU]
  Filled 2015-07-01: qty 5

## 2015-07-01 MED ORDER — SODIUM CHLORIDE 0.9 % IJ SOLN
10.0000 mL | INTRAMUSCULAR | Status: DC | PRN
  Administered 2015-07-01: 10 mL
  Filled 2015-07-01: qty 10

## 2015-07-01 NOTE — Patient Instructions (Addendum)
[facility] Discharge Instructions for Patients Receiving Chemotherapy  Today you received the following chemotherapy agents Herceptin.  To help prevent nausea and vomiting after your treatment, we encourage you to take your nausea medication as directed.   If you develop nausea and vomiting that is not controlled by your nausea medication, call the clinic.   BELOW ARE SYMPTOMS THAT SHOULD BE REPORTED IMMEDIATELY:  *FEVER GREATER THAN 100.5 F  *CHILLS WITH OR WITHOUT FEVER  NAUSEA AND VOMITING THAT IS NOT CONTROLLED WITH YOUR NAUSEA MEDICATION  *UNUSUAL SHORTNESS OF BREATH  *UNUSUAL BRUISING OR BLEEDING  TENDERNESS IN MOUTH AND THROAT WITH OR WITHOUT PRESENCE OF ULCERS  *URINARY PROBLEMS  *BOWEL PROBLEMS  UNUSUAL RASH Items with * indicate a potential emergency and should be followed up as soon as possible.  Feel free to call the clinic you have any questions or concerns. The clinic phone number is [phone].  Please show the [facility] at check-in to the Emergency Department and triage nurse.  Goserelin injection What is this medicine? GOSERELIN (GOE se rel in) is similar to a hormone found in the body. It lowers the amount of sex hormones that the body makes. Men will have lower testosterone levels and women will have lower estrogen levels while taking this medicine. In men, this medicine is used to treat prostate cancer; the injection is either given once per month or once every 12 weeks. A once per month injection (only) is used to treat women with endometriosis, dysfunctional uterine bleeding, or advanced breast cancer. This medicine may be used for other purposes; ask your health care provider or pharmacist if you have questions. COMMON BRAND NAME(S): Zoladex What should I tell my health care provider before I take this medicine? They need to know if you have any of these conditions (some only apply to women): -diabetes -heart disease or  previous heart attack -high blood pressure -high cholesterol -kidney disease -osteoporosis or low bone density -problems passing urine -spinal cord injury -stroke -tobacco smoker -an unusual or allergic reaction to goserelin, hormone therapy, other medicines, foods, dyes, or preservatives -pregnant or trying to get pregnant -breast-feeding How should I use this medicine? This medicine is for injection under the skin. It is given by a health care professional in a hospital or clinic setting. Men receive this injection once every 4 weeks or once every 12 weeks. Women will only receive the once every 4 weeks injection. Talk to your pediatrician regarding the use of this medicine in children. Special care may be needed. Overdosage: If you think you have taken too much of this medicine contact a poison control center or emergency room at once. NOTE: This medicine is only for you. Do not share this medicine with others. What if I miss a dose? It is important not to miss your dose. Call your doctor or health care professional if you are unable to keep an appointment. What may interact with this medicine? -female hormones like estrogen -herbal or dietary supplements like black cohosh, chasteberry, or DHEA -female hormones like testosterone -prasterone This list may not describe all possible interactions. Give your health care provider a list of all the medicines, herbs, non-prescription drugs, or dietary supplements you use. Also tell them if you smoke, drink alcohol, or use illegal drugs. Some items may interact with your medicine. What should I watch for while using this medicine? Visit your doctor or health care professional for regular checks on your progress. Your symptoms may appear  to get worse during the first weeks of this therapy. Tell your doctor or healthcare professional if your symptoms do not start to get better or if they get worse after this time. Your bones may get weaker if you  take this medicine for a long time. If you smoke or frequently drink alcohol you may increase your risk of bone loss. A family history of osteoporosis, chronic use of drugs for seizures (convulsions), or corticosteroids can also increase your risk of bone loss. Talk to your doctor about how to keep your bones strong. This medicine should stop regular monthly menstration in women. Tell your doctor if you continue to [facility]. Women should not become pregnant while taking this medicine or for 12 weeks after stopping this medicine. Women should inform their doctor if they wish to become pregnant or think they might be pregnant. There is a potential for serious side effects to an unborn child. Talk to your health care professional or pharmacist for more information. Do not breast-feed an infant while taking this medicine. Men should inform their doctors if they wish to father a child. This medicine may lower sperm counts. Talk to your health care professional or pharmacist for more information. What side effects may I notice from receiving this medicine? Side effects that you should report to your doctor or health care professional as soon as possible: -allergic reactions like skin rash, itching or hives, swelling of the face, lips, or tongue -bone pain -breathing problems -changes in vision -chest pain -feeling faint or lightheaded, falls -fever, chills -pain, swelling, warmth in the leg -pain, tingling, numbness in the hands or feet -signs and symptoms of low blood pressure like dizziness; feeling faint or lightheaded, falls; unusually weak or tired -stomach pain -swelling of the ankles, feet, hands -trouble passing urine or change in the amount of urine -unusually high or low blood pressure -unusually weak or tired Side effects that usually do not require medical attention (report to your doctor or health care professional if they continue or are bothersome): -change in sex drive or  performance -changes in breast size in both males and females -changes in emotions or moods -headache -hot flashes -irritation at site where injected -loss of appetite -skin problems like acne, dry skin -vaginal dryness This list may not describe all possible side effects. Call your doctor for medical advice about side effects. You may report side effects to FDA at 1-800-FDA-1088. Where should I keep my medicine? This drug is given in a hospital or clinic and will not be stored at home. NOTE: This sheet is a summary. It may not cover all possible information. If you have questions about this medicine, talk to your doctor, pharmacist, or health care provider.  2015, [employer]vier/[employer] Standard. (2014-02-05 11:10:35)

## 2015-07-03 ENCOUNTER — Encounter: Payer: Self-pay | Admitting: *Deleted

## 2015-07-03 ENCOUNTER — Other Ambulatory Visit: Payer: Self-pay | Admitting: *Deleted

## 2015-07-03 MED ORDER — TAMOXIFEN CITRATE 20 MG PO TABS: 20.0000 mg | ORAL_TABLET | Freq: Every day | ORAL | Status: DC

## 2015-07-03 MED ORDER — GABAPENTIN 300 MG PO CAPS: 300.0000 mg | ORAL_CAPSULE | Freq: Every day | ORAL | Status: DC

## 2015-07-03 MED ORDER — TAMOXIFEN CITRATE 20 MG PO TABS
20.0000 mg | ORAL_TABLET | Freq: Every day | ORAL | Status: DC
Start: 2015-07-03 — End: 2016-04-20

## 2015-07-04 ENCOUNTER — Telehealth: Payer: Self-pay | Admitting: Oncology

## 2015-07-04 NOTE — Telephone Encounter (Signed)
Left message re appointments for 8/9 and 8/30 per 7/21 pof. Per pof added herc 8/9 and 8/30. Schedule mailed.

## 2015-07-09 ENCOUNTER — Encounter: Payer: Self-pay | Admitting: Oncology

## 2015-07-10 ENCOUNTER — Encounter: Payer: Self-pay | Admitting: Radiation Oncology

## 2015-07-10 ENCOUNTER — Ambulatory Visit
Admission: RE | Admit: 2015-07-10 | Discharge: 2015-07-10 | Disposition: A | Discharge status: Home / Self Care | Payer: 59 | Source: Ambulatory Visit | Attending: Radiation Oncology | Admitting: Radiation Oncology

## 2015-07-10 VITALS — BP 119/73 | HR 52 | Temp 98.3°F | Resp 16 | Ht 66.0 in | Wt 136.5 lb

## 2015-07-10 DIAGNOSIS — C50412 Malignant neoplasm of upper-outer quadrant of left female breast: Secondary | ICD-10-CM

## 2015-07-10 NOTE — Progress Notes (Signed)
Nancy Austin here for follow up.  She denies pain and fatigue.  She is taking tamoxifen.  The skin on her left breast is intact.    BP 119/73 mmHg  Pulse 52  Temp(Src) 98.3 F (36.8 C) (Oral)  Resp 16  Ht 5\' 6"  (1.676 m)  Wt 136 lb 8 oz (61.916 kg)  BMI 22.04 kg/m2

## 2015-07-10 NOTE — Progress Notes (Signed)
  Radiation Oncology         [phone] ________________________________  Name: Nancy Austin MRN: [id number]  Date: 07/10/2015  DOB: 01-05-1976  Follow-Up Visit Note  CC: Logan Bores, MD  Rolm Bookbinder, MD   Diagnosis:  ypT1c, ypN1a, stage IIA invasive ductal carcinoma of the left breast, grade 1, negative margins  Interval Since Last Radiation:  6  weeks (04/16/2015-06/02/2015)  Site/dose:   Left breast 50.4 gray in 28 fractions, axillary/supraclavicular region 45 gray in 25 fractions, lumpectomy boost 10 gray in 5 fractions  Narrative:  The patient returns today for routine follow-up. She denies pain and fatigue. She is taking tamoxifen. Reports tolerating it well. No itching within the breast. She denies any problems with swelling in her left arm or hand. She does have a lymphedema sleeve if needed.                      ALLERGIES:  is allergic to morphine and related and tegaderm ag mesh.  Meds: Current Outpatient Prescriptions  Medication Sig Dispense Refill  . diphenhydrAMINE (BENADRYL) 25 MG tablet Take 25 mg by mouth every 6 (six) hours as needed.    . gabapentin (NEURONTIN) 300 MG capsule Take 1 capsule (300 mg total) by mouth at bedtime. 90 capsule 3  . Multiple Vitamin (MULTIVITAMIN) tablet Take 1 tablet by mouth daily.    . tamoxifen (NOLVADEX) 20 MG tablet Take 1 tablet (20 mg total) by mouth daily. 30 tablet 1  . emollient (BIAFINE) cream Apply 1 application topically 2 (two) times daily.    . hyaluronate sodium (RADIAPLEXRX) GEL Apply 1 application topically 2 (two) times daily.    . non-metallic deodorant Jethro Poling) MISC Apply 1 application topically daily as needed.    . tamoxifen (NOLVADEX) 20 MG tablet Take 1 tablet (20 mg total) by mouth daily. 90 tablet 3   No current facility-administered medications for this encounter.    Physical Findings: The patient is in no acute distress. Patient is alert and oriented.  height is 5\' 6"  (1.676 m) and weight is  136 lb 8 oz (61.916 kg). Her oral temperature is 98.3 F (36.8 C). Her blood pressure is 119/73 and her pulse is 52. Her respiration is 16. .  No significant changes. Lungs are clear. Heart has regular rate and rhythm. No palpable cervical, supraclavicular, or axillary adenopathy. Skin has healed well with no palpable mass in the breast.  Lab Findings: Lab Results  Component Value Date   WBC 2.5* 07/01/2015   HGB 12.6 07/01/2015   HCT 37.4 07/01/2015   MCV 88.2 07/01/2015   PLT 220 07/01/2015    Radiographic Findings: No results found.  Impression:  The patient is recovering from the effects of radiation with no signs of recurrence.  Plan: Prn f/u with rad/onc. She shall use the tamoxifen for 10 years as prescribed by med/onc and she will continue her f/u with med/onc.  This document serves as a record of services personally performed by Gery Pray, MD. It was created on his behalf by Darcus Austin, a trained medical scribe. The creation of this record is based on the scribe's personal observations and the provider's statements to them. This document has been checked and approved by the attending provider.  ____________________________________   Blair Promise, PhD, MD

## 2015-07-22 ENCOUNTER — Ambulatory Visit (HOSPITAL_BASED_OUTPATIENT_CLINIC_OR_DEPARTMENT_OTHER): Payer: 59

## 2015-07-22 ENCOUNTER — Other Ambulatory Visit (HOSPITAL_BASED_OUTPATIENT_CLINIC_OR_DEPARTMENT_OTHER): Payer: 59

## 2015-07-22 VITALS — BP 107/73 | HR 54 | Temp 98.0°F | Resp 16

## 2015-07-22 DIAGNOSIS — C773 Secondary and unspecified malignant neoplasm of axilla and upper limb lymph nodes: Secondary | ICD-10-CM | POA: Diagnosis not present

## 2015-07-22 DIAGNOSIS — Z5112 Encounter for antineoplastic immunotherapy: Secondary | ICD-10-CM | POA: Diagnosis not present

## 2015-07-22 DIAGNOSIS — C50412 Malignant neoplasm of upper-outer quadrant of left female breast: Secondary | ICD-10-CM

## 2015-07-22 DIAGNOSIS — C50812 Malignant neoplasm of overlapping sites of left female breast: Secondary | ICD-10-CM

## 2015-07-22 LAB — CBC WITH DIFFERENTIAL/PLATELET
BASO%: 0.7 % (ref 0.0–2.0)
Basophils Absolute: 0 10*3/uL (ref 0.0–0.1)
EOS%: 0.8 % (ref 0.0–7.0)
Eosinophils Absolute: 0 10*3/uL (ref 0.0–0.5)
HCT: 36.4 % (ref 34.8–46.6)
HGB: 12.1 g/dL (ref 11.6–15.9)
LYMPH#: 0.7 10*3/uL — AB (ref 0.9–3.3)
LYMPH%: 19.3 % (ref 14.0–49.7)
MCH: 29.7 pg (ref 25.1–34.0)
MCHC: 33.2 g/dL (ref 31.5–36.0)
MCV: 89.4 fL (ref 79.5–101.0)
MONO#: 0.2 10*3/uL (ref 0.1–0.9)
MONO%: 5.8 % (ref 0.0–14.0)
NEUT#: 2.8 10*3/uL (ref 1.5–6.5)
NEUT%: 73.4 % (ref 38.4–76.8)
Platelets: 223 10*3/uL (ref 145–400)
RBC: 4.07 10*6/uL (ref 3.70–5.45)
RDW: 13 % (ref 11.2–14.5)
WBC: 3.8 10*3/uL — ABNORMAL LOW (ref 3.9–10.3)

## 2015-07-22 LAB — COMPREHENSIVE METABOLIC PANEL (CC13)
ALBUMIN: 3.9 g/dL (ref 3.5–5.0)
ALT: 15 U/L (ref 0–55)
ANION GAP: 6 meq/L (ref 3–11)
AST: 19 U/L (ref 5–34)
Alkaline Phosphatase: 59 U/L (ref 40–150)
BILIRUBIN TOTAL: 0.67 mg/dL (ref 0.20–1.20)
BUN: 12.7 mg/dL (ref 7.0–26.0)
CO2: 31 mEq/L — ABNORMAL HIGH (ref 22–29)
CREATININE: 0.8 mg/dL (ref 0.6–1.1)
Calcium: 9.1 mg/dL (ref 8.4–10.4)
Chloride: 105 mEq/L (ref 98–109)
EGFR: 88 mL/min/{1.73_m2} — ABNORMAL LOW (ref >90)
Glucose: 86 mg/dl (ref 70–140)
Potassium: 4 mEq/L (ref 3.5–5.1)
Sodium: 143 mEq/L (ref 136–145)
Total Protein: 6.5 g/dL (ref 6.4–8.3)

## 2015-07-22 MED ORDER — DIPHENHYDRAMINE HCL 25 MG PO CAPS
ORAL_CAPSULE | ORAL | Status: AC
  Filled 2015-07-22: qty 1

## 2015-07-22 MED ORDER — SODIUM CHLORIDE 0.9 % IJ SOLN
10.0000 mL | INTRAMUSCULAR | Status: DC | PRN
  Administered 2015-07-22: 10 mL
  Filled 2015-07-22: qty 10

## 2015-07-22 MED ORDER — ACETAMINOPHEN 325 MG PO TABS
ORAL_TABLET | ORAL | Status: AC
  Filled 2015-07-22: qty 2

## 2015-07-22 MED ORDER — DIPHENHYDRAMINE HCL 25 MG PO CAPS
25.0000 mg | ORAL_CAPSULE | Freq: Once | ORAL | Status: AC
  Administered 2015-07-22: 25 mg via ORAL

## 2015-07-22 MED ORDER — SODIUM CHLORIDE 0.9 % IV SOLN
Freq: Once | INTRAVENOUS | Status: AC
  Administered 2015-07-22: 14:00:00 via INTRAVENOUS

## 2015-07-22 MED ORDER — HEPARIN SOD (PORK) LOCK FLUSH 100 UNIT/ML IV SOLN
500.0000 [IU] | Freq: Once | INTRAVENOUS | Status: AC | PRN
  Administered 2015-07-22: 500 [IU]
  Filled 2015-07-22: qty 5

## 2015-07-22 MED ORDER — ACETAMINOPHEN 325 MG PO TABS
650.0000 mg | ORAL_TABLET | Freq: Once | ORAL | Status: AC
  Administered 2015-07-22: 650 mg via ORAL

## 2015-07-22 MED ORDER — SODIUM CHLORIDE 0.9 % IV SOLN
6.0000 mg/kg | Freq: Once | INTRAVENOUS | Status: AC
  Administered 2015-07-22: 399 mg via INTRAVENOUS
  Filled 2015-07-22: qty 19

## 2015-07-22 NOTE — Patient Instructions (Signed)

## 2015-07-28 ENCOUNTER — Telehealth: Payer: Self-pay | Admitting: Oncology

## 2015-07-28 NOTE — Telephone Encounter (Signed)
Returned Advertising account executive. Confirmed appointment for August.

## 2015-08-05 ENCOUNTER — Ambulatory Visit (HOSPITAL_BASED_OUTPATIENT_CLINIC_OR_DEPARTMENT_OTHER): Payer: 59

## 2015-08-05 ENCOUNTER — Ambulatory Visit (HOSPITAL_COMMUNITY): Payer: 59

## 2015-08-05 ENCOUNTER — Other Ambulatory Visit (HOSPITAL_COMMUNITY): Payer: 59

## 2015-08-05 VITALS — BP 109/70 | HR 72 | Temp 99.2°F

## 2015-08-05 DIAGNOSIS — C50812 Malignant neoplasm of overlapping sites of left female breast: Secondary | ICD-10-CM | POA: Diagnosis not present

## 2015-08-05 DIAGNOSIS — C773 Secondary and unspecified malignant neoplasm of axilla and upper limb lymph nodes: Secondary | ICD-10-CM

## 2015-08-05 DIAGNOSIS — C50412 Malignant neoplasm of upper-outer quadrant of left female breast: Secondary | ICD-10-CM

## 2015-08-05 DIAGNOSIS — Z5111 Encounter for antineoplastic chemotherapy: Secondary | ICD-10-CM

## 2015-08-05 MED ORDER — GOSERELIN ACETATE 3.6 MG ~~LOC~~ IMPL
3.6000 mg | DRUG_IMPLANT | SUBCUTANEOUS | Status: DC
  Administered 2015-08-05: 3.6 mg via SUBCUTANEOUS
  Filled 2015-08-05: qty 3.6

## 2015-08-07 ENCOUNTER — Ambulatory Visit (HOSPITAL_BASED_OUTPATIENT_CLINIC_OR_DEPARTMENT_OTHER)
Admission: RE | Admit: 2015-08-07 | Discharge: 2015-08-07 | Disposition: A | Discharge status: Home / Self Care | Payer: 59 | Source: Ambulatory Visit | Attending: Oncology | Admitting: Oncology

## 2015-08-07 ENCOUNTER — Encounter (HOSPITAL_COMMUNITY): Payer: Self-pay

## 2015-08-07 ENCOUNTER — Ambulatory Visit (HOSPITAL_COMMUNITY)
Admission: RE | Admit: 2015-08-07 | Discharge: 2015-08-07 | Disposition: A | Discharge status: Home / Self Care | Payer: 59 | Source: Ambulatory Visit | Attending: Cardiology | Admitting: Cardiology

## 2015-08-07 VITALS — BP 126/72 | HR 52 | Ht 66.0 in | Wt 137.4 lb

## 2015-08-07 DIAGNOSIS — Z79899 Other long term (current) drug therapy: Secondary | ICD-10-CM | POA: Diagnosis not present

## 2015-08-07 DIAGNOSIS — Z885 Allergy status to narcotic agent status: Secondary | ICD-10-CM | POA: Insufficient documentation

## 2015-08-07 DIAGNOSIS — Z808 Family history of malignant neoplasm of other organs or systems: Secondary | ICD-10-CM | POA: Diagnosis not present

## 2015-08-07 DIAGNOSIS — Z9221 Personal history of antineoplastic chemotherapy: Secondary | ICD-10-CM | POA: Diagnosis not present

## 2015-08-07 DIAGNOSIS — C50412 Malignant neoplasm of upper-outer quadrant of left female breast: Secondary | ICD-10-CM | POA: Diagnosis not present

## 2015-08-07 DIAGNOSIS — Z923 Personal history of irradiation: Secondary | ICD-10-CM | POA: Diagnosis not present

## 2015-08-07 DIAGNOSIS — Z170 Estrogen receptor positive status [ER+]: Secondary | ICD-10-CM | POA: Diagnosis not present

## 2015-08-07 NOTE — Progress Notes (Signed)
  Echocardiogram 2D Echocardiogram has been performed.  Khamia Stambaugh 08/07/2015, 10:01 AM

## 2015-08-07 NOTE — Patient Instructions (Signed)
We will contact you in 3 months to schedule your next appointment and echocardiogram  

## 2015-08-07 NOTE — Progress Notes (Signed)
[place] NOTE  Patient ID: Nancy Austin, female   DOB: October 12, 1976, 39 y.o.   MRN: [id number]  Referring Physician:   HPI:  Nancy Austin is a delightful 39 y/o woman with no significant PMHx except for recently diagnosed breast CA. She herself found a lump in her left breast early October 2015. Biopsy of the lump showed ([id number] [phone]) and invasive ductal carcinoma, grade 2, estrogen receptor 100% positive, progesterone receptor 89% positive, both with strong staining intensity, with an MIB-1 of 16%. HER-2 was amplified with a signals ratio of 5.07 and a copy number per cell of 6.85. Paragraph on 09/30/2014 the patient underwent bilateral breast MRIs. This showed a 1.7 cm mass in the upper outer quadrant of the left breast. There was no other suspicious finding in either breast and no abnormal appearing adenopathy.  Treatment history: (1) neoadjuvant treatment consisting of carboplatin, docetaxel, trastuzumab and pertuzumab given every 21 days x6, completed 02/10/2015 (a) breast MRI 02/11/2015 showed a complete radiologic response (2) left lumpectomy and sentinel lymph node sampling 03/06/2015 showed a residual ypT1c ypN1a, stage IIA invasive ductal carcinoma, grade 1, with repeat prognostic panel still triple positive, and negative margins (3) trastuzumab to be continued to total one year (through October 2016) (4) adjuvant radiation to be completed 06/02/2015 (5) antiestrogens to follow radiation (a) goserelin started 04/03/2015 (b) to start tamoxifen 06/26/2015   She denies any h/o known cardiac disease. She was formerly a runner and hopes to get back to this. No HF symptoms.   Echo from 04/17/15 and today reviewed personally in clinic Echo 08/08/15 EF 60% GLS -19.8% mild TR   Review of Systems: [y] = yes, _0  = no   General: Weight gain _1 ; Weight loss _2 ; Anorexia _3 ; Fatigue Blue.Reese ]; Fever _4 ; Chills _5 ; Weakness _6   Cardiac:  Chest pain/pressure _7 ; Resting SOB _8 ; Exertional SOB _9 ; Orthopnea _10 ; Pedal Edema _11 ; Palpitations _12 ; Syncope _13 ; Presyncope _14 ; Paroxysmal nocturnal dyspnea_15   Pulmonary: Cough _16 ; Wheezing_17 ; Hemoptysis_18 ; Sputum _19 ; Snoring _20   GI: Vomiting_21 ; Dysphagia_22 ; Melena_23 ; Hematochezia _24 ; Heartburn_25 ; Abdominal pain _26 ; Constipation _27 ; Diarrhea _28 ; BRBPR _29   GU: Hematuria_30 ; Dysuria _31 ; Nocturia_32   Vascular: Pain in legs with walking _33 ; Pain in feet with lying flat _34 ; Non-healing sores _35 ; Stroke _36 ; TIA _37 ; Slurred speech _38 ;  Neuro: Headaches_39 ; Vertigo_40 ; Seizures_41 ; Paresthesias_42 ;Blurred vision _43 ; Diplopia _44 ; Vision changes _45   Ortho/Skin: Arthritis _46 ; Joint pain _47 ; Muscle pain _48 ; Joint swelling _49 ; Back Pain _50 ; Rash _51   Psych: Depression_52 ; Anxiety_53   Heme: Bleeding problems _54 ; Clotting disorders _55 ; Anemia _56   Endocrine: Diabetes _57 ; Thyroid dysfunction_58    Past Medical History  Diagnosis Date  . Breast cancer of upper-outer quadrant of left female breast 09/24/2014  . History of chemotherapy     finished 02/10/2015  . Radiation 04/16/15-06/02/15    left breast 50.4 gray, axillary/supraclavicular region 45 gran, lumpectomy boost 10 gray    Current Outpatient Prescriptions  Medication Sig Dispense Refill  . diphenhydrAMINE (BENADRYL) 25 MG tablet Take 25 mg by mouth every 6 (six) hours as needed.    Marland Kitchen emollient (BIAFINE) cream Apply 1 application topically 2 (two) times daily.    Marland Kitchen  gabapentin (NEURONTIN) 300 MG capsule Take 1 capsule (300 mg total) by mouth at bedtime. 90 capsule 3  . hyaluronate sodium (RADIAPLEXRX) GEL Apply 1 application topically 2 (two) times daily.    . Multiple Vitamin (MULTIVITAMIN) tablet Take 1 tablet by mouth daily.    . non-metallic deodorant Jethro Poling) MISC Apply 1 application topically daily as needed.    . tamoxifen (NOLVADEX) 20 MG tablet Take 1 tablet (20 mg total) by mouth daily. 30 tablet 1     No current facility-administered medications for this encounter.    Allergies  Allergen Reactions  . Morphine And Related Nausea And Vomiting  . Tegaderm Ag Mesh [Silver] Rash      Social History   Social History  . Marital Status: Married    Spouse Name: N/A  . Number of Children: 2  . Years of Education: N/A   Occupational History  . Not on file.   Social History Main Topics  . Smoking status: Never Smoker   . Smokeless tobacco: Never Used  . Alcohol Use: Yes     Comment: 1-3 drinks/week  . Drug Use: No  . Sexual Activity: Not on file   Other Topics Concern  . Not on file   Social History Narrative      Family History  Problem Relation Age of Onset  . Skin cancer Father   . Prostate cancer Maternal Uncle 41    currently 68  . Multiple myeloma Paternal Grandmother 62    Deceased 66    Filed Vitals:   08/07/15 0958  BP: 126/72  Pulse: 52  Height: _0  (1.676 m)  Weight: 137 lb 6.4 oz (62.324 kg)  SpO2: 99%    PHYSICAL EXAM: General:  Well appearing. No respiratory difficulty HEENT: normal Neck: supple. no JVD. Carotids 2+ bilat; no bruits. No lymphadenopathy or thryomegaly appreciated. Cor: PMI nondisplaced. Regular rate & rhythm. No rubs, gallops or murmurs. Lungs: clear Abdomen: soft, nontender, nondistended. No hepatosplenomegaly. No bruits or masses. Good bowel sounds. Extremities: no cyanosis, clubbing, rash, edema Neuro: alert & oriented x 3, cranial nerves grossly intact. moves all 4 extremities w/o difficulty. Affect pleasant.   ASSESSMENT & PLAN: 1. Breast CA - triple +    --Explained incidence of Herceptin cardiotoxicity and role of Cardio-oncology clinic at length. Echo images reviewed personally. All parameters stable. Reviewed signs and symptoms of HF to look for. Continue Herceptin. Follow-up with echo in 3 months at the end of her therapy.  Ken Bonn,MD 10:14 PM

## 2015-08-11 ENCOUNTER — Other Ambulatory Visit (HOSPITAL_COMMUNITY): Payer: 59

## 2015-08-12 ENCOUNTER — Other Ambulatory Visit (HOSPITAL_BASED_OUTPATIENT_CLINIC_OR_DEPARTMENT_OTHER): Payer: 59

## 2015-08-12 ENCOUNTER — Ambulatory Visit (HOSPITAL_BASED_OUTPATIENT_CLINIC_OR_DEPARTMENT_OTHER): Payer: 59 | Admitting: Oncology

## 2015-08-12 ENCOUNTER — Telehealth: Payer: Self-pay | Admitting: Oncology

## 2015-08-12 ENCOUNTER — Other Ambulatory Visit: Payer: 59

## 2015-08-12 ENCOUNTER — Ambulatory Visit (HOSPITAL_BASED_OUTPATIENT_CLINIC_OR_DEPARTMENT_OTHER): Payer: 59

## 2015-08-12 ENCOUNTER — Ambulatory Visit: Payer: 59

## 2015-08-12 ENCOUNTER — Ambulatory Visit: Payer: 59 | Admitting: Oncology

## 2015-08-12 VITALS — BP 123/87 | HR 65 | Temp 98.7°F | Resp 18 | Ht 66.0 in | Wt 136.4 lb

## 2015-08-12 DIAGNOSIS — C50812 Malignant neoplasm of overlapping sites of left female breast: Secondary | ICD-10-CM

## 2015-08-12 DIAGNOSIS — C773 Secondary and unspecified malignant neoplasm of axilla and upper limb lymph nodes: Secondary | ICD-10-CM | POA: Diagnosis not present

## 2015-08-12 DIAGNOSIS — Z5112 Encounter for antineoplastic immunotherapy: Secondary | ICD-10-CM | POA: Diagnosis not present

## 2015-08-12 DIAGNOSIS — C50412 Malignant neoplasm of upper-outer quadrant of left female breast: Secondary | ICD-10-CM

## 2015-08-12 LAB — CBC WITH DIFFERENTIAL/PLATELET
BASO%: 0.8 % (ref 0.0–2.0)
BASOS ABS: 0 10*3/uL (ref 0.0–0.1)
EOS ABS: 0 10*3/uL (ref 0.0–0.5)
EOS%: 1.6 % (ref 0.0–7.0)
HEMATOCRIT: 37.1 % (ref 34.8–46.6)
HEMOGLOBIN: 12.3 g/dL (ref 11.6–15.9)
LYMPH#: 0.6 10*3/uL — AB (ref 0.9–3.3)
LYMPH%: 26.9 % (ref 14.0–49.7)
MCH: 29.7 pg (ref 25.1–34.0)
MCHC: 33.1 g/dL (ref 31.5–36.0)
MCV: 89.7 fL (ref 79.5–101.0)
MONO#: 0.2 10*3/uL (ref 0.1–0.9)
MONO%: 6.7 % (ref 0.0–14.0)
NEUT%: 64 % (ref 38.4–76.8)
NEUTROS ABS: 1.5 10*3/uL (ref 1.5–6.5)
Platelets: 196 10*3/uL (ref 145–400)
RBC: 4.13 10*6/uL (ref 3.70–5.45)
RDW: 13 % (ref 11.2–14.5)
WBC: 2.3 10*3/uL — ABNORMAL LOW (ref 3.9–10.3)

## 2015-08-12 LAB — COMPREHENSIVE METABOLIC PANEL (CC13)
ALBUMIN: 3.8 g/dL (ref 3.5–5.0)
ALK PHOS: 53 U/L (ref 40–150)
ALT: 13 U/L (ref 0–55)
AST: 19 U/L (ref 5–34)
Anion Gap: 7 mEq/L (ref 3–11)
BILIRUBIN TOTAL: 0.46 mg/dL (ref 0.20–1.20)
BUN: 11.1 mg/dL (ref 7.0–26.0)
CALCIUM: 9.3 mg/dL (ref 8.4–10.4)
CO2: 28 mEq/L (ref 22–29)
Chloride: 107 mEq/L (ref 98–109)
Creatinine: 0.8 mg/dL (ref 0.6–1.1)
EGFR: >90 mL/min/{1.73_m2} (ref >90)
GLUCOSE: 76 mg/dL (ref 70–140)
POTASSIUM: 4.2 meq/L (ref 3.5–5.1)
SODIUM: 142 meq/L (ref 136–145)
TOTAL PROTEIN: 6.5 g/dL (ref 6.4–8.3)

## 2015-08-12 MED ORDER — ACETAMINOPHEN 325 MG PO TABS
650.0000 mg | ORAL_TABLET | Freq: Once | ORAL | Status: AC
  Administered 2015-08-12: 650 mg via ORAL

## 2015-08-12 MED ORDER — HEPARIN SOD (PORK) LOCK FLUSH 100 UNIT/ML IV SOLN
500.0000 [IU] | Freq: Once | INTRAVENOUS | Status: AC | PRN
  Administered 2015-08-12: 500 [IU]
  Filled 2015-08-12: qty 5

## 2015-08-12 MED ORDER — ACETAMINOPHEN 325 MG PO TABS
ORAL_TABLET | ORAL | Status: AC
Start: 2015-08-12 — End: 2015-08-12
  Filled 2015-08-12: qty 2

## 2015-08-12 MED ORDER — SODIUM CHLORIDE 0.9 % IJ SOLN
10.0000 mL | INTRAMUSCULAR | Status: DC | PRN
  Administered 2015-08-12: 10 mL
  Filled 2015-08-12: qty 10

## 2015-08-12 MED ORDER — TRASTUZUMAB CHEMO INJECTION 440 MG
6.0000 mg/kg | Freq: Once | INTRAVENOUS | Status: AC
  Administered 2015-08-12: 399 mg via INTRAVENOUS
  Filled 2015-08-12: qty 19

## 2015-08-12 MED ORDER — DIPHENHYDRAMINE HCL 25 MG PO CAPS
ORAL_CAPSULE | ORAL | Status: AC
  Filled 2015-08-12: qty 1

## 2015-08-12 MED ORDER — SODIUM CHLORIDE 0.9 % IV SOLN
Freq: Once | INTRAVENOUS | Status: AC
  Administered 2015-08-12: 10:00:00 via INTRAVENOUS

## 2015-08-12 MED ORDER — DIPHENHYDRAMINE HCL 25 MG PO CAPS
25.0000 mg | ORAL_CAPSULE | Freq: Once | ORAL | Status: AC
  Administered 2015-08-12: 25 mg via ORAL

## 2015-08-12 NOTE — Patient Instructions (Signed)
[facility] Discharge Instructions for Patients Receiving Chemotherapy  Today you received the following chemotherapy agents herceptin  To help prevent nausea and vomiting after your treatment, we encourage you to take your nausea medication  If you develop nausea and vomiting that is not controlled by your nausea medication, call the clinic.   BELOW ARE SYMPTOMS THAT SHOULD BE REPORTED IMMEDIATELY:  *FEVER GREATER THAN 100.5 F  *CHILLS WITH OR WITHOUT FEVER  NAUSEA AND VOMITING THAT IS NOT CONTROLLED WITH YOUR NAUSEA MEDICATION  *UNUSUAL SHORTNESS OF BREATH  *UNUSUAL BRUISING OR BLEEDING  TENDERNESS IN MOUTH AND THROAT WITH OR WITHOUT PRESENCE OF ULCERS  *URINARY PROBLEMS  *BOWEL PROBLEMS  UNUSUAL RASH Items with * indicate a potential emergency and should be followed up as soon as possible.  Feel free to call the clinic you have any questions or concerns. The clinic phone number is [phone].  Please show the CHEMO ALERT CARD at check-in to the Emergency Department and triage nurse.   

## 2015-08-12 NOTE — Telephone Encounter (Signed)
Gave avs & calendar for September thru December

## 2015-08-12 NOTE — Progress Notes (Signed)
[place]  [phone] [phone]     ID: HADIYAH MARICLE DOB: [birth year]  MR#: [id number]  XBD#:[id number]  Patient Care Team: Paula Compton, MD as PCP - General (Obstetrics and Gynecology) Rolm Bookbinder, MD as Consulting Physician (General Surgery) Gery Pray, MD as Consulting Physician (Radiation Oncology) Chauncey Cruel, MD as Consulting Physician (Oncology) OTHER MD:  CHIEF COMPLAINT: Triple positive breast cancer  CURRENT TREATMENT: anti-HER-2 immunotherapy; on goserelin and tamoxifen   BREAST CANCER HISTORY: From the original Intake note:  Ellionna herself found a lump in her left breast early October 2015 and brought it to her gynecologist attention. On 09/20/2014 she was set up for bilateral diagnostic mammography and left breast ultrasonography of the breast Center. This was the patient's first ever mammogram. In the area of concern in the left breast there was suspicious pleomorphic calcifications spanning 1.1 cm. There was no discrete mammographic mass in this dense breasts (category C.). On physical exam, there was a palpable firm at 1.5 cm mass at the 3:00 position in the left breast. By ultrasound this was irregular and hypoechoic and measured 1.8 cm. Ultrasound of the left axilla was unremarkable. Aside from multiple cysts in the left breast there were no other findings of concern.  On the same day, 09/20/2014, the patient underwent biopsy of the left breast palpable mass. This showed ([id number] [phone]) and invasive ductal carcinoma, grade 2, estrogen receptor 100% positive, progesterone receptor 89% positive, both with strong staining intensity, with an MIB-1 of 16%. HER-2 was amplified with a signals ratio of 5.07 and a copy number per cell of 6.85. Paragraph on 09/30/2014 the patient underwent bilateral breast MRIs. This showed a 1.7 cm mass in the upper outer quadrant of the left breast. There was no other suspicious finding in  either breast and no abnormal appearing adenopathy.  The patient's subsequent history is as detailed below.  INTERVAL HISTORY: Keshauna teturns today for follow-up of her breast cancer. She completed her radiation treatments in June and started tamoxifen 06/26/2015. She has had more hot flashes, but not particularly vaginal dryness or other significant menopausal symptoms. The gabapentin at bedtime helps with the nighttime hot flashes. She does not likely goserelin shots, although they are "quick". She is considering bilateral salpingo-oophorectomy with Dr. Marvel Plan and I think that would be a good idea: Now that she knows of menopause feels she knows she can deal with it permanently. She is tolerating the Herceptin without any side effects that she is aware of and we just obtain an echocardiogram which shows an excellent ejection fraction.  REVIEW OF SYSTEMS: For exercise she's been doing walking in a very little running. She admits to being a little bit irritable, but not depressed. A detailed review of systems today was otherwise noncontributory  PAST MEDICAL HISTORY: Past Medical History  Diagnosis Date  . Breast cancer of upper-outer quadrant of left female breast 09/24/2014  . History of chemotherapy     finished 02/10/2015  . Radiation 04/16/15-06/02/15    left breast 50.4 gray, axillary/supraclavicular region 45 gran, lumpectomy boost 10 gray    PAST SURGICAL HISTORY: Past Surgical History  Procedure Laterality Date  . Tonsillectomy  1996  . Portacath placement N/A 10/15/2014    Procedure: INSERTION PORT-A-CATH;  Surgeon: Rolm Bookbinder, MD;  Location: [facility];  Service: General;  Laterality: N/A;  . Radioactive seed guided mastectomy with axillary sentinel lymph node biopsy Left 03/06/2015    Procedure: RADIOACTIVE SEED GUIDED PARTIAL MASTECTOMY WITH AXILLARY  SENTINEL LYMPH NODE BIOPSY;  Surgeon: Rolm Bookbinder, MD;  Location: [facility];  Service: General;   Laterality: Left;    FAMILY HISTORY Family History  Problem Relation Age of Onset  . Skin cancer Father   . Prostate cancer Maternal Uncle 47    currently 66  . Multiple myeloma Paternal Grandmother 13    Deceased 53  The patient's parents are both living. The patient has one brother, no sisters. One grandmother was diagnosed with multiple myeloma at age 4. There is no history of breast or ovarian cancer in the family.   GYNECOLOGIC HISTORY:  No LMP recorded.  menarche age 39, first live birth age 39. The patient is GX P2. She was still having regular periods at the start of chemotherapy. She was on birth control pills on and off for the last 15 years, stopping in October of 2015.   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 Her husband Rolla Plate works as a Immunologist at [employer]. Their children are Sam age 75 and Terrence Dupont age 4.    ADVANCED DIRECTIVES: In place   HEALTH MAINTENANCE: Social History  Substance Use Topics  . Smoking status: Never Smoker   . Smokeless tobacco: Never Used  . Alcohol Use: Yes     Comment: 1-3 drinks/week     Colonoscopy:  PAP:  November 2014  Bone density:  Lipid panel:  Allergies  Allergen Reactions  . Morphine And Related Nausea And Vomiting  . Tegaderm Ag Mesh [Silver] Rash    Current Outpatient Prescriptions  Medication Sig Dispense Refill  . diphenhydrAMINE (BENADRYL) 25 MG tablet Take 25 mg by mouth every 6 (six) hours as needed.    Marland Kitchen emollient (BIAFINE) cream Apply 1 application topically 2 (two) times daily.    Marland Kitchen gabapentin (NEURONTIN) 300 MG capsule Take 1 capsule (300 mg total) by mouth at bedtime. 90 capsule 3  . hyaluronate sodium (RADIAPLEXRX) GEL Apply 1 application topically 2 (two) times daily.    . Multiple Vitamin (MULTIVITAMIN) tablet Take 1 tablet by mouth daily.    . non-metallic deodorant Jethro Poling) MISC Apply 1 application topically daily as needed.    . tamoxifen (NOLVADEX) 20 MG tablet Take 1  tablet (20 mg total) by mouth daily. 30 tablet 1   No current facility-administered medications for this visit.    OBJECTIVE: young white woman who appears stated age 28 Vitals:   08/12/15 0853  BP: 123/87  Pulse: 65  Temp: 98.7 F (37.1 C)  Resp: 18     Body mass index is 22.03 kg/(m^2).    ECOG FS:0 - Asymptomatic Baseline weight was 142 pounds, current weight .  Filed Weights   08/12/15 0853  Weight: 136 lb 6.4 oz (61.871 kg)   Sclerae unicteric, pupils round and equal Oropharynx clear and moist-- no thrush or other lesions No cervical or supraclavicular adenopathy Lungs no rales or rhonchi Heart regular rate and rhythm Abd soft, nontender, positive bowel sounds MSK no focal spinal tenderness, no upper extremity lymphedema Neuro: nonfocal, well oriented, appropriate affect Breasts: The right breast is unremarkable. The left breast is status post lumpectomy and radiation. It is a little bit larger than the right breast, indicating a little bit of swelling. There is no palpable mass however. There is no erythema. There is no skin or nipple change of concern. There is no evidence of disease recurrence. The left axilla is benign.   LAB RESULTS:  CMP  Component Value Date/Time   NA 143 07/22/2015 1323   NA 144 10/02/2014 1206   K 4.0 07/22/2015 1323   K 3.9 10/02/2014 1206   CL 104 10/02/2014 1206   CO2 31* 07/22/2015 1323   CO2 26 10/02/2014 1206   GLUCOSE 86 07/22/2015 1323   GLUCOSE 103* 10/02/2014 1206   BUN 12.7 07/22/2015 1323   BUN 11 10/02/2014 1206   CREATININE 0.8 07/22/2015 1323   CREATININE 0.87 10/02/2014 1206   CALCIUM 9.1 07/22/2015 1323   CALCIUM 9.4 10/02/2014 1206   PROT 6.5 07/22/2015 1323   PROT 7.3 10/02/2014 1206   ALBUMIN 3.9 07/22/2015 1323   ALBUMIN 3.9 10/02/2014 1206   AST 19 07/22/2015 1323   AST 20 10/02/2014 1206   ALT 15 07/22/2015 1323   ALT 16 10/02/2014 1206   ALKPHOS 59 07/22/2015 1323   ALKPHOS 45 10/02/2014 1206    BILITOT 0.67 07/22/2015 1323   BILITOT 0.4 10/02/2014 1206    I No results found for: SPEP  Lab Results  Component Value Date   WBC 2.3* 08/12/2015   NEUTROABS 1.5 08/12/2015   HGB 12.3 08/12/2015   HCT 37.1 08/12/2015   MCV 89.7 08/12/2015   PLT 196 08/12/2015      Chemistry      Component Value Date/Time   NA 143 07/22/2015 1323   NA 144 10/02/2014 1206   K 4.0 07/22/2015 1323   K 3.9 10/02/2014 1206   CL 104 10/02/2014 1206   CO2 31* 07/22/2015 1323   CO2 26 10/02/2014 1206   BUN 12.7 07/22/2015 1323   BUN 11 10/02/2014 1206   CREATININE 0.8 07/22/2015 1323   CREATININE 0.87 10/02/2014 1206      Component Value Date/Time   CALCIUM 9.1 07/22/2015 1323   CALCIUM 9.4 10/02/2014 1206   ALKPHOS 59 07/22/2015 1323   ALKPHOS 45 10/02/2014 1206   AST 19 07/22/2015 1323   AST 20 10/02/2014 1206   ALT 15 07/22/2015 1323   ALT 16 10/02/2014 1206   BILITOT 0.67 07/22/2015 1323   BILITOT 0.4 10/02/2014 1206       No results found for: LABCA2  No components found for: LABCA125  No results for input(s): INR in the last 168 hours.  Urinalysis No results found for: COLORURINE  STUDIES:  Transthoracic Echocardiography  Patient:  Iliana, Hutt MR #:    [id number] Study Date: 08/07/2015 Gender:   F Age:    89 Height:   167.6 cm Weight:   61.7 kg BSA:    1.7 m^2 Pt. Status: Room:  ATTENDING  Suvan Stcyr, Valli Glance   Daquann Merriott, Virgie Dad REFERRING  Fenix Rorke, Virgie Dad SONOGRAPHER Diamond Nickel PERFORMING  [facility], Outpatient  cc:  ------------------------------------------------------------------- LV EF: 55% -  60%  ASSESSMENT: 39 y.o. BRCA negative [place] woman s/p Left breast upper outer quadrant biopsy 09/20/2014, for a clinical T1c N0, stage IA invasive ductal carcinoma, grade 2, estrogen and progesterone receptor positive, HER-2 amplified with a signal is ratio of 5.07, and an MIB-1 of 16%  (1) neoadjuvant  treatment consisting of carboplatin, docetaxel, trastuzumab and pertuzumab given every 21 days x6,  completed 02/10/2015  (a) breast MRI 02/11/2015 showed a complete radiologic response  (2) left lumpectomy and sentinel lymph node sampling 03/06/2015 showed a residual ypT1c ypN1a, stage IIA invasive ductal carcinoma, grade 1, with repeat prognostic panel still triple positive, and negative margins  (3) trastuzumab to be continued to total one year (last dose 10/01/2015)  (  a) most recent echo 08/07/2015 finds an ejection fraction of 55-60%.  (4) adjuvant radiation 04/16/2015-06/02/2015 Left breast 50.4 gray in 28 fractions, axillary/supraclavicular region 45 gray in 25 fractions, lumpectomy boost 10 gray in 5 fractions  (5) antiestrogens to follow radiation  (a) goserelin started 04/03/2015  (b) started tamoxifen 06/26/2015  (6)  the [employer] geneprofile  ([facility] genetics) obtained November 2015 did not reveal a mutation in ATM, BARD1, BRCA1, BRCA BRIP1, CDH1, CHEK2, MRE11A, MUTYH, NBN, NF1, PALB2, PTEN, RAD50, RAD51C, RAD51D, or TP53  PLAN: Shterna is tolerating menopause remarkably well for a young woman. She is considering bilateral salpingo-oophorectomy, and this would certainly save her the discomfort of the goserelin shots. She now knows that she can tolerate menopause. Accordingly likely that will be performed some time before the end of the year. There is no indication for hysterectomy.  She will complete her Herceptin treatments October 19. I have alerted Dr. Donne Hazel that the patient can have her port removed anytime after that  She understands that in this setting it is not clear whether tamoxifen or anastrozole is superior long-term. For now I think tamoxifen is better because it would allow her to use local estrogen preparations if needed for vaginal dryness issues.  [name] has a good understanding of the overall plan. She knows to call for any problems that may develop before her  next visit here, which will be in 3 months. Chauncey Cruel, MD   08/12/2015 9:15 AM

## 2015-08-15 ENCOUNTER — Encounter: Payer: Self-pay | Admitting: *Deleted

## 2015-08-26 ENCOUNTER — Ambulatory Visit: Payer: 59

## 2015-09-02 ENCOUNTER — Ambulatory Visit: Payer: 59

## 2015-09-02 ENCOUNTER — Ambulatory Visit (HOSPITAL_BASED_OUTPATIENT_CLINIC_OR_DEPARTMENT_OTHER): Payer: 59

## 2015-09-02 VITALS — BP 111/84 | HR 65 | Temp 98.6°F | Resp 18

## 2015-09-02 DIAGNOSIS — Z5112 Encounter for antineoplastic immunotherapy: Secondary | ICD-10-CM

## 2015-09-02 DIAGNOSIS — C50412 Malignant neoplasm of upper-outer quadrant of left female breast: Secondary | ICD-10-CM

## 2015-09-02 DIAGNOSIS — C773 Secondary and unspecified malignant neoplasm of axilla and upper limb lymph nodes: Secondary | ICD-10-CM

## 2015-09-02 DIAGNOSIS — C50812 Malignant neoplasm of overlapping sites of left female breast: Secondary | ICD-10-CM | POA: Diagnosis not present

## 2015-09-02 DIAGNOSIS — Z5111 Encounter for antineoplastic chemotherapy: Secondary | ICD-10-CM

## 2015-09-02 MED ORDER — HEPARIN SOD (PORK) LOCK FLUSH 100 UNIT/ML IV SOLN
500.0000 [IU] | Freq: Once | INTRAVENOUS | Status: AC | PRN
  Administered 2015-09-02: 500 [IU]
  Filled 2015-09-02: qty 5

## 2015-09-02 MED ORDER — SODIUM CHLORIDE 0.9 % IV SOLN
Freq: Once | INTRAVENOUS | Status: AC
  Administered 2015-09-02: 10:00:00 via INTRAVENOUS

## 2015-09-02 MED ORDER — SODIUM CHLORIDE 0.9 % IJ SOLN
10.0000 mL | INTRAMUSCULAR | Status: DC | PRN
  Administered 2015-09-02: 10 mL
  Filled 2015-09-02: qty 10

## 2015-09-02 MED ORDER — DIPHENHYDRAMINE HCL 25 MG PO CAPS
25.0000 mg | ORAL_CAPSULE | Freq: Once | ORAL | Status: AC
  Administered 2015-09-02: 25 mg via ORAL

## 2015-09-02 MED ORDER — TRASTUZUMAB CHEMO INJECTION 440 MG
6.0000 mg/kg | Freq: Once | INTRAVENOUS | Status: AC
  Administered 2015-09-02: 399 mg via INTRAVENOUS
  Filled 2015-09-02: qty 19

## 2015-09-02 MED ORDER — DIPHENHYDRAMINE HCL 25 MG PO CAPS
ORAL_CAPSULE | ORAL | Status: AC
  Filled 2015-09-02: qty 1

## 2015-09-02 MED ORDER — GOSERELIN ACETATE 3.6 MG ~~LOC~~ IMPL
3.6000 mg | DRUG_IMPLANT | SUBCUTANEOUS | Status: DC
  Administered 2015-09-02: 3.6 mg via SUBCUTANEOUS
  Filled 2015-09-02: qty 3.6

## 2015-09-02 MED ORDER — GOSERELIN ACETATE 3.6 MG ~~LOC~~ IMPL: 3.6000 mg | DRUG_IMPLANT | SUBCUTANEOUS | Status: DC

## 2015-09-02 MED ORDER — ACETAMINOPHEN 325 MG PO TABS
650.0000 mg | ORAL_TABLET | Freq: Once | ORAL | Status: AC
  Administered 2015-09-02: 650 mg via ORAL

## 2015-09-02 MED ORDER — ACETAMINOPHEN 325 MG PO TABS
ORAL_TABLET | ORAL | Status: AC
  Filled 2015-09-02: qty 2

## 2015-09-02 NOTE — Progress Notes (Signed)
Zoladex injection given by infusion nurse. 

## 2015-09-02 NOTE — Patient Instructions (Addendum)
[facility] Discharge Instructions for Patients Receiving Chemotherapy  Today you received the following chemotherapy agents Herceptin To help prevent nausea and vomiting after your treatment, we encourage you to take your nausea medication as prescribed.  If you develop nausea and vomiting that is not controlled by your nausea medication, call the clinic.   BELOW ARE SYMPTOMS THAT SHOULD BE REPORTED IMMEDIATELY:  *FEVER GREATER THAN 100.5 F  *CHILLS WITH OR WITHOUT FEVER  NAUSEA AND VOMITING THAT IS NOT CONTROLLED WITH YOUR NAUSEA MEDICATION  *UNUSUAL SHORTNESS OF BREATH  *UNUSUAL BRUISING OR BLEEDING  TENDERNESS IN MOUTH AND THROAT WITH OR WITHOUT PRESENCE OF ULCERS  *URINARY PROBLEMS  *BOWEL PROBLEMS  UNUSUAL RASH Items with * indicate a potential emergency and should be followed up as soon as possible.  Feel free to call the clinic you have any questions or concerns. The clinic phone number is [phone].  Please show the [place] at check-in to the Emergency Department and triage nurse.  Goserelin injection What is this medicine? GOSERELIN (GOE se rel in) is similar to a hormone found in the body. It lowers the amount of sex hormones that the body makes. Men will have lower testosterone levels and women will have lower estrogen levels while taking this medicine. In men, this medicine is used to treat prostate cancer; the injection is either given once per month or once every 12 weeks. A once per month injection (only) is used to treat women with endometriosis, dysfunctional uterine bleeding, or advanced breast cancer. This medicine may be used for other purposes; ask your health care provider or pharmacist if you have questions. COMMON BRAND NAME(S): Zoladex What should I tell my health care provider before I take this medicine? They need to know if you have any of these conditions (some only apply to women): -diabetes -heart disease or previous  heart attack -high blood pressure -high cholesterol -kidney disease -osteoporosis or low bone density -problems passing urine -spinal cord injury -stroke -tobacco smoker -an unusual or allergic reaction to goserelin, hormone therapy, other medicines, foods, dyes, or preservatives -pregnant or trying to get pregnant -breast-feeding How should I use this medicine? This medicine is for injection under the skin. It is given by a health care professional in a hospital or clinic setting. Men receive this injection once every 4 weeks or once every 12 weeks. Women will only receive the once every 4 weeks injection. Talk to your pediatrician regarding the use of this medicine in children. Special care may be needed. Overdosage: If you think you have taken too much of this medicine contact a poison control center or emergency room at once. NOTE: This medicine is only for you. Do not share this medicine with others. What if I miss a dose? It is important not to miss your dose. Call your doctor or health care professional if you are unable to keep an appointment. What may interact with this medicine? -female hormones like estrogen -herbal or dietary supplements like black cohosh, chasteberry, or DHEA -female hormones like testosterone -prasterone This list may not describe all possible interactions. Give your health care provider a list of all the medicines, herbs, non-prescription drugs, or dietary supplements you use. Also tell them if you smoke, drink alcohol, or use illegal drugs. Some items may interact with your medicine. What should I watch for while using this medicine? Visit your doctor or health care professional for regular checks on your progress. Your symptoms may appear to get  worse during the first weeks of this therapy. Tell your doctor or healthcare professional if your symptoms do not start to get better or if they get worse after this time. Your bones may get weaker if you take this  medicine for a long time. If you smoke or frequently drink alcohol you may increase your risk of bone loss. A family history of osteoporosis, chronic use of drugs for seizures (convulsions), or corticosteroids can also increase your risk of bone loss. Talk to your doctor about how to keep your bones strong. This medicine should stop regular monthly menstration in women. Tell your doctor if you continue to [facility]. Women should not become pregnant while taking this medicine or for 12 weeks after stopping this medicine. Women should inform their doctor if they wish to become pregnant or think they might be pregnant. There is a potential for serious side effects to an unborn child. Talk to your health care professional or pharmacist for more information. Do not breast-feed an infant while taking this medicine. Men should inform their doctors if they wish to father a child. This medicine may lower sperm counts. Talk to your health care professional or pharmacist for more information. What side effects may I notice from receiving this medicine? Side effects that you should report to your doctor or health care professional as soon as possible: -allergic reactions like skin rash, itching or hives, swelling of the face, lips, or tongue -bone pain -breathing problems -changes in vision -chest pain -feeling faint or lightheaded, falls -fever, chills -pain, swelling, warmth in the leg -pain, tingling, numbness in the hands or feet -signs and symptoms of low blood pressure like dizziness; feeling faint or lightheaded, falls; unusually weak or tired -stomach pain -swelling of the ankles, feet, hands -trouble passing urine or change in the amount of urine -unusually high or low blood pressure -unusually weak or tired Side effects that usually do not require medical attention (report to your doctor or health care professional if they continue or are bothersome): -change in sex drive or performance -changes  in breast size in both males and females -changes in emotions or moods -headache -hot flashes -irritation at site where injected -loss of appetite -skin problems like acne, dry skin -vaginal dryness This list may not describe all possible side effects. Call your doctor for medical advice about side effects. You may report side effects to FDA at 1-800-FDA-1088. Where should I keep my medicine? This drug is given in a hospital or clinic and will not be stored at home. NOTE: This sheet is a summary. It may not cover all possible information. If you have questions about this medicine, talk to your doctor, pharmacist, or health care provider.  2015, [employer]vier/Gold Standard. (2014-02-05 11:10:35)

## 2015-09-16 ENCOUNTER — Ambulatory Visit: Payer: 59

## 2015-09-23 ENCOUNTER — Other Ambulatory Visit (HOSPITAL_BASED_OUTPATIENT_CLINIC_OR_DEPARTMENT_OTHER): Payer: 59

## 2015-09-23 ENCOUNTER — Ambulatory Visit: Payer: 59

## 2015-09-23 ENCOUNTER — Ambulatory Visit (HOSPITAL_BASED_OUTPATIENT_CLINIC_OR_DEPARTMENT_OTHER): Payer: 59

## 2015-09-23 VITALS — BP 124/87 | HR 55 | Temp 97.9°F | Resp 16

## 2015-09-23 DIAGNOSIS — Z5112 Encounter for antineoplastic immunotherapy: Secondary | ICD-10-CM | POA: Diagnosis not present

## 2015-09-23 DIAGNOSIS — C50412 Malignant neoplasm of upper-outer quadrant of left female breast: Secondary | ICD-10-CM

## 2015-09-23 DIAGNOSIS — C773 Secondary and unspecified malignant neoplasm of axilla and upper limb lymph nodes: Secondary | ICD-10-CM

## 2015-09-23 DIAGNOSIS — C50812 Malignant neoplasm of overlapping sites of left female breast: Secondary | ICD-10-CM

## 2015-09-23 LAB — COMPREHENSIVE METABOLIC PANEL (CC13)
ALBUMIN: 3.9 g/dL (ref 3.5–5.0)
ALK PHOS: 51 U/L (ref 40–150)
ALT: 15 U/L (ref 0–55)
AST: 18 U/L (ref 5–34)
Anion Gap: 8 mEq/L (ref 3–11)
BUN: 13.1 mg/dL (ref 7.0–26.0)
CALCIUM: 9.3 mg/dL (ref 8.4–10.4)
CHLORIDE: 108 meq/L (ref 98–109)
CO2: 26 mEq/L (ref 22–29)
CREATININE: 0.8 mg/dL (ref 0.6–1.1)
EGFR: >90 mL/min/{1.73_m2} (ref >90)
GLUCOSE: 106 mg/dL (ref 70–140)
Potassium: 3.8 mEq/L (ref 3.5–5.1)
Sodium: 142 mEq/L (ref 136–145)
Total Bilirubin: 0.59 mg/dL (ref 0.20–1.20)
Total Protein: 6.6 g/dL (ref 6.4–8.3)

## 2015-09-23 LAB — CBC WITH DIFFERENTIAL/PLATELET
BASO%: 0.4 % (ref 0.0–2.0)
Basophils Absolute: 0 10*3/uL (ref 0.0–0.1)
EOS%: 0.8 % (ref 0.0–7.0)
Eosinophils Absolute: 0 10*3/uL (ref 0.0–0.5)
HEMATOCRIT: 38.1 % (ref 34.8–46.6)
HEMOGLOBIN: 12.6 g/dL (ref 11.6–15.9)
LYMPH#: 0.7 10*3/uL — AB (ref 0.9–3.3)
LYMPH%: 27.3 % (ref 14.0–49.7)
MCH: 29.4 pg (ref 25.1–34.0)
MCHC: 33.1 g/dL (ref 31.5–36.0)
MCV: 88.8 fL (ref 79.5–101.0)
MONO#: 0.2 10*3/uL (ref 0.1–0.9)
MONO%: 5.9 % (ref 0.0–14.0)
NEUT#: 1.7 10*3/uL (ref 1.5–6.5)
NEUT%: 65.6 % (ref 38.4–76.8)
Platelets: 188 10*3/uL (ref 145–400)
RBC: 4.29 10*6/uL (ref 3.70–5.45)
RDW: 12.5 % (ref 11.2–14.5)
WBC: 2.6 10*3/uL — AB (ref 3.9–10.3)

## 2015-09-23 MED ORDER — ACETAMINOPHEN 325 MG PO TABS
ORAL_TABLET | ORAL | Status: AC
  Filled 2015-09-23: qty 2

## 2015-09-23 MED ORDER — ACETAMINOPHEN 325 MG PO TABS
650.0000 mg | ORAL_TABLET | Freq: Once | ORAL | Status: AC
  Administered 2015-09-23: 650 mg via ORAL

## 2015-09-23 MED ORDER — SODIUM CHLORIDE 0.9 % IV SOLN
Freq: Once | INTRAVENOUS | Status: AC
  Administered 2015-09-23: 11:00:00 via INTRAVENOUS

## 2015-09-23 MED ORDER — HEPARIN SOD (PORK) LOCK FLUSH 100 UNIT/ML IV SOLN
500.0000 [IU] | Freq: Once | INTRAVENOUS | Status: AC | PRN
  Administered 2015-09-23: 500 [IU]
  Filled 2015-09-23: qty 5

## 2015-09-23 MED ORDER — DIPHENHYDRAMINE HCL 25 MG PO CAPS
ORAL_CAPSULE | ORAL | Status: AC
  Filled 2015-09-23: qty 1

## 2015-09-23 MED ORDER — TRASTUZUMAB CHEMO INJECTION 440 MG
6.0000 mg/kg | Freq: Once | INTRAVENOUS | Status: AC
  Administered 2015-09-23: 399 mg via INTRAVENOUS
  Filled 2015-09-23: qty 19

## 2015-09-23 MED ORDER — SODIUM CHLORIDE 0.9 % IJ SOLN
10.0000 mL | INTRAMUSCULAR | Status: DC | PRN
  Administered 2015-09-23: 10 mL
  Filled 2015-09-23: qty 10

## 2015-09-23 MED ORDER — DIPHENHYDRAMINE HCL 25 MG PO CAPS
25.0000 mg | ORAL_CAPSULE | Freq: Once | ORAL | Status: AC
  Administered 2015-09-23: 25 mg via ORAL

## 2015-09-23 NOTE — Patient Instructions (Signed)
[facility] Discharge Instructions for Patients Receiving Chemotherapy  Today you received the following chemotherapy agents Herceptin To help prevent nausea and vomiting after your treatment, we encourage you to take your nausea medication as prescribed.  If you develop nausea and vomiting that is not controlled by your nausea medication, call the clinic.   BELOW ARE SYMPTOMS THAT SHOULD BE REPORTED IMMEDIATELY:  *FEVER GREATER THAN 100.5 F  *CHILLS WITH OR WITHOUT FEVER  NAUSEA AND VOMITING THAT IS NOT CONTROLLED WITH YOUR NAUSEA MEDICATION  *UNUSUAL SHORTNESS OF BREATH  *UNUSUAL BRUISING OR BLEEDING  TENDERNESS IN MOUTH AND THROAT WITH OR WITHOUT PRESENCE OF ULCERS  *URINARY PROBLEMS  *BOWEL PROBLEMS  UNUSUAL RASH Items with * indicate a potential emergency and should be followed up as soon as possible.  Feel free to call the clinic you have any questions or concerns. The clinic phone number is [phone].  Please show the [place] at check-in to the Emergency Department and triage nurse.  Goserelin injection What is this medicine? GOSERELIN (GOE se rel in) is similar to a hormone found in the body. It lowers the amount of sex hormones that the body makes. Men will have lower testosterone levels and women will have lower estrogen levels while taking this medicine. In men, this medicine is used to treat prostate cancer; the injection is either given once per month or once every 12 weeks. A once per month injection (only) is used to treat women with endometriosis, dysfunctional uterine bleeding, or advanced breast cancer. This medicine may be used for other purposes; ask your health care provider or pharmacist if you have questions. COMMON BRAND NAME(S): Zoladex What should I tell my health care provider before I take this medicine? They need to know if you have any of these conditions (some only apply to women): -diabetes -heart disease or previous  heart attack -high blood pressure -high cholesterol -kidney disease -osteoporosis or low bone density -problems passing urine -spinal cord injury -stroke -tobacco smoker -an unusual or allergic reaction to goserelin, hormone therapy, other medicines, foods, dyes, or preservatives -pregnant or trying to get pregnant -breast-feeding How should I use this medicine? This medicine is for injection under the skin. It is given by a health care professional in a hospital or clinic setting. Men receive this injection once every 4 weeks or once every 12 weeks. Women will only receive the once every 4 weeks injection. Talk to your pediatrician regarding the use of this medicine in children. Special care may be needed. Overdosage: If you think you have taken too much of this medicine contact a poison control center or emergency room at once. NOTE: This medicine is only for you. Do not share this medicine with others. What if I miss a dose? It is important not to miss your dose. Call your doctor or health care professional if you are unable to keep an appointment. What may interact with this medicine? -female hormones like estrogen -herbal or dietary supplements like black cohosh, chasteberry, or DHEA -female hormones like testosterone -prasterone This list may not describe all possible interactions. Give your health care provider a list of all the medicines, herbs, non-prescription drugs, or dietary supplements you use. Also tell them if you smoke, drink alcohol, or use illegal drugs. Some items may interact with your medicine. What should I watch for while using this medicine? Visit your doctor or health care professional for regular checks on your progress. Your symptoms may appear to get  worse during the first weeks of this therapy. Tell your doctor or healthcare professional if your symptoms do not start to get better or if they get worse after this time. Your bones may get weaker if you take this  medicine for a long time. If you smoke or frequently drink alcohol you may increase your risk of bone loss. A family history of osteoporosis, chronic use of drugs for seizures (convulsions), or corticosteroids can also increase your risk of bone loss. Talk to your doctor about how to keep your bones strong. This medicine should stop regular monthly menstration in women. Tell your doctor if you continue to [facility]. Women should not become pregnant while taking this medicine or for 12 weeks after stopping this medicine. Women should inform their doctor if they wish to become pregnant or think they might be pregnant. There is a potential for serious side effects to an unborn child. Talk to your health care professional or pharmacist for more information. Do not breast-feed an infant while taking this medicine. Men should inform their doctors if they wish to father a child. This medicine may lower sperm counts. Talk to your health care professional or pharmacist for more information. What side effects may I notice from receiving this medicine? Side effects that you should report to your doctor or health care professional as soon as possible: -allergic reactions like skin rash, itching or hives, swelling of the face, lips, or tongue -bone pain -breathing problems -changes in vision -chest pain -feeling faint or lightheaded, falls -fever, chills -pain, swelling, warmth in the leg -pain, tingling, numbness in the hands or feet -signs and symptoms of low blood pressure like dizziness; feeling faint or lightheaded, falls; unusually weak or tired -stomach pain -swelling of the ankles, feet, hands -trouble passing urine or change in the amount of urine -unusually high or low blood pressure -unusually weak or tired Side effects that usually do not require medical attention (report to your doctor or health care professional if they continue or are bothersome): -change in sex drive or performance -changes  in breast size in both males and females -changes in emotions or moods -headache -hot flashes -irritation at site where injected -loss of appetite -skin problems like acne, dry skin -vaginal dryness This list may not describe all possible side effects. Call your doctor for medical advice about side effects. You may report side effects to FDA at 1-800-FDA-1088. Where should I keep my medicine? This drug is given in a hospital or clinic and will not be stored at home. NOTE: This sheet is a summary. It may not cover all possible information. If you have questions about this medicine, talk to your doctor, pharmacist, or health care provider.  2015, Elsevier/Gold Standard. (2014-02-05 11:10:35)

## 2015-09-25 ENCOUNTER — Telehealth: Payer: Self-pay | Admitting: *Deleted

## 2015-09-25 ENCOUNTER — Telehealth: Payer: Self-pay | Admitting: Oncology

## 2015-09-25 DIAGNOSIS — C50412 Malignant neoplasm of upper-outer quadrant of left female breast: Secondary | ICD-10-CM

## 2015-09-25 NOTE — Telephone Encounter (Signed)
Returned patient call re r/s 10/25 appointment due to out of town. Patient given new appointment for 10/24 @ 12:45 pm.

## 2015-09-25 NOTE — Telephone Encounter (Signed)
Called pt and congratulate on completion of Herceptin. Discuss survivorship program and referral. Denies questions or needs at this time. Encourage pt to call with concerns. Received verbal understanding.

## 2015-09-30 ENCOUNTER — Encounter: Payer: Self-pay | Admitting: Oncology

## 2015-09-30 NOTE — Progress Notes (Signed)
Contacted pt to make aware [place] will expire on 10/07/15 and if she would like to renew. Pt states she is done with Herceptin and does not need to renew. Pt states she has a few more injections left and will be done and she is ok. Pt knows to contact me if she should have any additional needs.

## 2015-10-03 ENCOUNTER — Telehealth: Payer: Self-pay | Admitting: Oncology

## 2015-10-03 NOTE — Telephone Encounter (Signed)
Returned patient call re time for 10/24 appointment. Gave patient new time for 12:45 pm.

## 2015-10-06 ENCOUNTER — Ambulatory Visit (HOSPITAL_BASED_OUTPATIENT_CLINIC_OR_DEPARTMENT_OTHER): Payer: 59

## 2015-10-06 VITALS — BP 118/70 | HR 63 | Temp 98.2°F

## 2015-10-06 DIAGNOSIS — C773 Secondary and unspecified malignant neoplasm of axilla and upper limb lymph nodes: Secondary | ICD-10-CM

## 2015-10-06 DIAGNOSIS — Z5111 Encounter for antineoplastic chemotherapy: Secondary | ICD-10-CM | POA: Diagnosis not present

## 2015-10-06 DIAGNOSIS — C50812 Malignant neoplasm of overlapping sites of left female breast: Secondary | ICD-10-CM

## 2015-10-06 DIAGNOSIS — C50412 Malignant neoplasm of upper-outer quadrant of left female breast: Secondary | ICD-10-CM

## 2015-10-06 MED ORDER — GOSERELIN ACETATE 3.6 MG ~~LOC~~ IMPL
3.6000 mg | DRUG_IMPLANT | SUBCUTANEOUS | Status: DC
  Administered 2015-10-06: 3.6 mg via SUBCUTANEOUS
  Filled 2015-10-06: qty 3.6

## 2015-10-07 ENCOUNTER — Ambulatory Visit: Payer: 59

## 2015-10-14 DIAGNOSIS — L989 Disorder of the skin and subcutaneous tissue, unspecified: Secondary | ICD-10-CM

## 2015-10-14 HISTORY — DX: Disorder of the skin and subcutaneous tissue, unspecified: L98.9

## 2015-10-15 ENCOUNTER — Encounter (HOSPITAL_BASED_OUTPATIENT_CLINIC_OR_DEPARTMENT_OTHER): Payer: Self-pay | Admitting: *Deleted

## 2015-10-17 ENCOUNTER — Encounter: Payer: Self-pay | Admitting: Genetic Counselor

## 2015-10-17 DIAGNOSIS — Z1379 Encounter for other screening for genetic and chromosomal anomalies: Secondary | ICD-10-CM | POA: Insufficient documentation

## 2015-10-20 ENCOUNTER — Other Ambulatory Visit: Payer: Self-pay | Admitting: General Surgery

## 2015-10-20 ENCOUNTER — Telehealth: Payer: Self-pay | Admitting: *Deleted

## 2015-10-20 NOTE — Telephone Encounter (Signed)
Pt called to inform she is having her ovaries removed on 11/30. R/s labs appt to time to 1045 and cancelled injections.

## 2015-10-22 ENCOUNTER — Ambulatory Visit (HOSPITAL_BASED_OUTPATIENT_CLINIC_OR_DEPARTMENT_OTHER): Payer: 59 | Admitting: Anesthesiology

## 2015-10-22 ENCOUNTER — Encounter (HOSPITAL_BASED_OUTPATIENT_CLINIC_OR_DEPARTMENT_OTHER)
Admission: RE | Disposition: A | Discharge status: Home / Self Care | Payer: Self-pay | Source: Ambulatory Visit | Attending: General Surgery

## 2015-10-22 ENCOUNTER — Ambulatory Visit (HOSPITAL_BASED_OUTPATIENT_CLINIC_OR_DEPARTMENT_OTHER)
Admission: RE | Admit: 2015-10-22 | Discharge: 2015-10-22 | Disposition: A | Discharge status: Home / Self Care | Payer: 59 | Source: Ambulatory Visit | Attending: General Surgery | Admitting: General Surgery

## 2015-10-22 ENCOUNTER — Encounter (HOSPITAL_BASED_OUTPATIENT_CLINIC_OR_DEPARTMENT_OTHER): Payer: Self-pay | Admitting: Anesthesiology

## 2015-10-22 DIAGNOSIS — Z853 Personal history of malignant neoplasm of breast: Secondary | ICD-10-CM | POA: Insufficient documentation

## 2015-10-22 DIAGNOSIS — G7089 Other specified myoneural disorders: Secondary | ICD-10-CM | POA: Diagnosis not present

## 2015-10-22 DIAGNOSIS — Z885 Allergy status to narcotic agent status: Secondary | ICD-10-CM | POA: Diagnosis not present

## 2015-10-22 DIAGNOSIS — R220 Localized swelling, mass and lump, head: Secondary | ICD-10-CM | POA: Diagnosis present

## 2015-10-22 DIAGNOSIS — Z452 Encounter for adjustment and management of vascular access device: Secondary | ICD-10-CM | POA: Diagnosis not present

## 2015-10-22 DIAGNOSIS — Z87891 Personal history of nicotine dependence: Secondary | ICD-10-CM | POA: Insufficient documentation

## 2015-10-22 DIAGNOSIS — Z888 Allergy status to other drugs, medicaments and biological substances status: Secondary | ICD-10-CM | POA: Insufficient documentation

## 2015-10-22 DIAGNOSIS — Z9221 Personal history of antineoplastic chemotherapy: Secondary | ICD-10-CM | POA: Diagnosis not present

## 2015-10-22 HISTORY — PX: PORT-A-CATH REMOVAL: SHX5289

## 2015-10-22 HISTORY — DX: Disorder of the skin and subcutaneous tissue, unspecified: L98.9

## 2015-10-22 HISTORY — DX: Personal history of irradiation: Z92.3

## 2015-10-22 HISTORY — DX: Personal history of malignant neoplasm of breast: Z85.3

## 2015-10-22 HISTORY — PX: LESION REMOVAL: SHX5196

## 2015-10-22 SURGERY — REMOVAL PORT-A-CATH
Anesthesia: Monitor Anesthesia Care | Site: Head

## 2015-10-22 MED ORDER — LIDOCAINE-EPINEPHRINE (PF) 1 %-1:200000 IJ SOLN
INTRAMUSCULAR | Status: DC | PRN
  Administered 2015-10-22: 4 mL

## 2015-10-22 MED ORDER — ONDANSETRON HCL 4 MG/2ML IJ SOLN
INTRAMUSCULAR | Status: AC
  Filled 2015-10-22: qty 2

## 2015-10-22 MED ORDER — DEXAMETHASONE SODIUM PHOSPHATE 4 MG/ML IJ SOLN
INTRAMUSCULAR | Status: DC | PRN
  Administered 2015-10-22: 10 mg via INTRAVENOUS

## 2015-10-22 MED ORDER — MIDAZOLAM HCL 2 MG/2ML IJ SOLN
1.0000 mg | INTRAMUSCULAR | Status: DC | PRN
  Administered 2015-10-22: 2 mg via INTRAVENOUS

## 2015-10-22 MED ORDER — GLYCOPYRROLATE 0.2 MG/ML IJ SOLN
0.2000 mg | Freq: Once | INTRAMUSCULAR | Status: DC | PRN
Start: 2015-10-22 — End: 2015-10-22

## 2015-10-22 MED ORDER — LIDOCAINE HCL (CARDIAC) 20 MG/ML IV SOLN
INTRAVENOUS | Status: AC
  Filled 2015-10-22: qty 5

## 2015-10-22 MED ORDER — ONDANSETRON HCL 4 MG/2ML IJ SOLN: 4.0000 mg | Freq: Four times a day (QID) | INTRAMUSCULAR | Status: DC | PRN

## 2015-10-22 MED ORDER — LIDOCAINE HCL (CARDIAC) 20 MG/ML IV SOLN
INTRAVENOUS | Status: DC | PRN
  Administered 2015-10-22: 50 mg via INTRAVENOUS

## 2015-10-22 MED ORDER — MIDAZOLAM HCL 2 MG/2ML IJ SOLN
INTRAMUSCULAR | Status: AC
  Filled 2015-10-22: qty 4

## 2015-10-22 MED ORDER — FENTANYL CITRATE (PF) 100 MCG/2ML IJ SOLN: 25.0000 ug | INTRAMUSCULAR | Status: DC | PRN

## 2015-10-22 MED ORDER — LIDOCAINE-EPINEPHRINE (PF) 1 %-1:200000 IJ SOLN
INTRAMUSCULAR | Status: AC
  Filled 2015-10-22: qty 30

## 2015-10-22 MED ORDER — BUPIVACAINE HCL (PF) 0.25 % IJ SOLN
INTRAMUSCULAR | Status: DC | PRN
  Administered 2015-10-22: 4 mL

## 2015-10-22 MED ORDER — OXYCODONE HCL 5 MG/5ML PO SOLN: 5.0000 mg | Freq: Once | ORAL | Status: DC | PRN

## 2015-10-22 MED ORDER — LACTATED RINGERS IV SOLN
INTRAVENOUS | Status: DC
Start: 2015-10-22 — End: 2015-10-22
  Administered 2015-10-22 (×2): via INTRAVENOUS

## 2015-10-22 MED ORDER — FENTANYL CITRATE (PF) 100 MCG/2ML IJ SOLN
50.0000 ug | INTRAMUSCULAR | Status: AC | PRN
  Administered 2015-10-22: 100 ug via INTRAVENOUS
  Administered 2015-10-22: 50 ug via INTRAVENOUS
  Administered 2015-10-22 (×2): 25 ug via INTRAVENOUS

## 2015-10-22 MED ORDER — FENTANYL CITRATE (PF) 100 MCG/2ML IJ SOLN
INTRAMUSCULAR | Status: AC
  Filled 2015-10-22: qty 4

## 2015-10-22 MED ORDER — OXYCODONE HCL 5 MG PO TABS: 5.0000 mg | ORAL_TABLET | Freq: Once | ORAL | Status: DC | PRN

## 2015-10-22 MED ORDER — ONDANSETRON HCL 4 MG/2ML IJ SOLN
INTRAMUSCULAR | Status: DC | PRN
  Administered 2015-10-22: 4 mg via INTRAVENOUS

## 2015-10-22 MED ORDER — SCOPOLAMINE 1 MG/3DAYS TD PT72: 1.0000 | MEDICATED_PATCH | Freq: Once | TRANSDERMAL | Status: DC | PRN

## 2015-10-22 MED ORDER — PROPOFOL 500 MG/50ML IV EMUL
INTRAVENOUS | Status: DC | PRN
  Administered 2015-10-22: 25 ug/kg/min via INTRAVENOUS

## 2015-10-22 SURGICAL SUPPLY — 33 items
BLADE SURG 15 STRL LF DISP TIS (BLADE) ×2 IMPLANT
BLADE SURG 15 STRL SS (BLADE) ×2
CHLORAPREP W/TINT 26ML (MISCELLANEOUS) ×4 IMPLANT
COVER BACK TABLE 60X90IN (DRAPES) ×4 IMPLANT
COVER MAYO STAND STRL (DRAPES) ×4 IMPLANT
DECANTER SPIKE VIAL GLASS SM (MISCELLANEOUS) IMPLANT
DRAPE LAPAROTOMY 100X72 PEDS (DRAPES) ×4 IMPLANT
ELECT COATED BLADE 2.86 ST (ELECTRODE) ×4 IMPLANT
ELECT REM PT RETURN 9FT ADLT (ELECTROSURGICAL) ×4
ELECTRODE REM PT RTRN 9FT ADLT (ELECTROSURGICAL) ×2 IMPLANT
GLOVE BIO SURGEON STRL SZ 6.5 (GLOVE) ×3 IMPLANT
GLOVE BIO SURGEON STRL SZ7 (GLOVE) ×8 IMPLANT
GLOVE BIO SURGEONS STRL SZ 6.5 (GLOVE) ×1
GLOVE BIOGEL PI IND STRL 7.0 (GLOVE) ×4 IMPLANT
GLOVE BIOGEL PI IND STRL 7.5 (GLOVE) ×6 IMPLANT
GLOVE BIOGEL PI INDICATOR 7.0 (GLOVE) ×4
GLOVE BIOGEL PI INDICATOR 7.5 (GLOVE) ×6
GLOVE SURG SS PI 7.0 STRL IVOR (GLOVE) ×4 IMPLANT
GOWN STRL REUS W/ TWL LRG LVL3 (GOWN DISPOSABLE) ×6 IMPLANT
GOWN STRL REUS W/TWL LRG LVL3 (GOWN DISPOSABLE) ×6
LIQUID BAND (GAUZE/BANDAGES/DRESSINGS) ×4 IMPLANT
MARKER SKIN DUAL TIP RULER LAB (MISCELLANEOUS) ×4 IMPLANT
NEEDLE HYPO 25X1 1.5 SAFETY (NEEDLE) ×4 IMPLANT
PACK BASIN DAY SURGERY FS (CUSTOM PROCEDURE TRAY) ×4 IMPLANT
PENCIL BUTTON HOLSTER BLD 10FT (ELECTRODE) ×4 IMPLANT
SLEEVE SCD COMPRESS KNEE MED (MISCELLANEOUS) IMPLANT
SUT MNCRL AB 4-0 PS2 18 (SUTURE) ×4 IMPLANT
SUT MON AB 4-0 PC3 18 (SUTURE) IMPLANT
SUT VIC AB 3-0 SH 27 (SUTURE) ×2
SUT VIC AB 3-0 SH 27X BRD (SUTURE) ×2 IMPLANT
SYR CONTROL 10ML LL (SYRINGE) ×4 IMPLANT
TOWEL OR 17X24 6PK STRL BLUE (TOWEL DISPOSABLE) ×4 IMPLANT
TOWEL OR NON WOVEN STRL DISP B (DISPOSABLE) ×4 IMPLANT

## 2015-10-22 NOTE — Discharge Instructions (Signed)
PORT-A-CATH: POST OP INSTRUCTIONS  Always review your discharge instruction sheet given to you by the facility where your surgery was performed.   1. A prescription for pain medication may be given to you upon discharge. Take your pain medication as prescribed, if needed. If narcotic pain medicine is not needed, then you make take acetaminophen (Tylenol) or ibuprofen (Advil) as needed.  2. Take your usually prescribed medications unless otherwise directed. 3. If you need a refill on your pain medication, please contact our office. All narcotic pain medicine now requires a paper prescription.  Phoned in and fax refills are no longer allowed by law.  Prescriptions will not be filled after 5 pm or on weekends.  4. You should follow a light diet for the remainder of the day after your procedure. 5. Most patients will experience some mild swelling and/or bruising in the area of the incision. It may take several days to resolve. 6. It is common to experience some constipation if taking pain medication after surgery. Increasing fluid intake and taking a stool softener (such as Colace) will usually help or prevent this problem from occurring. A mild laxative (Milk of Magnesia or Miralax) should be taken according to package directions if there are no bowel movements after 48 hours.  7. Unless discharge instructions indicate otherwise, you may remove your bandages 48 hours after surgery, and you may shower at that time. You may have steri-strips (small white skin tapes) in place directly over the incision.  These strips should be left on the skin for 7-10 days.  If your surgeon used Dermabond (skin glue) on the incision, you may shower in 24 hours.  The glue will flake off over the next 2-3 weeks.  8. ACTIVITIES:  Limit activity involving your arms for the next 24 hours.  10.You may need to see your doctor in the office for a follow-up appointment.  Please       check with your doctor.    WHEN TO CALL  YOUR DOCTOR [phone]): 1. Fever over 101.0 2. Chills 3. Continued bleeding from incision 4. Increased redness and tenderness at the site 5. Shortness of breath, difficulty breathing   The clinic staff is available to answer your questions during regular business hours. Please dont hesitate to call and ask to speak to one of the nurses or medical assistants for clinical concerns. If you have a medical emergency, go to the nearest emergency room or call 911.  A surgeon from [facility] is always on call at the hospital.     For further information, please visit [email or web address]     Post Anesthesia Home Care Instructions  Activity: Get plenty of rest for the remainder of the day. A responsible adult should stay with you for 24 hours following the procedure.  For the next 24 hours, DO NOT: -Drive a car -Paediatric nurse -Drink alcoholic beverages -Take any medication unless instructed by your physician -Make any legal decisions or sign important papers.  Meals: Start with liquid foods such as gelatin or soup. Progress to regular foods as tolerated. Avoid greasy, spicy, heavy foods. If nausea and/or vomiting occur, drink only clear liquids until the nausea and/or vomiting subsides. Call your physician if vomiting continues.  Special Instructions/Symptoms: Your throat may feel dry or sore from the anesthesia or the breathing tube placed in your throat during surgery. If this causes discomfort, gargle with warm salt water. The discomfort should disappear within 24 hours.  If you had a  scopolamine patch placed behind your ear for the management of post- operative nausea and/or vomiting:  1. The medication in the patch is effective for 72 hours, after which it should be removed.  Wrap patch in a tissue and discard in the trash. Wash hands thoroughly with soap and water. 2. You may remove the patch earlier than 72 hours if you experience unpleasant side  effects which may include dry mouth, dizziness or visual disturbances. 3. Avoid touching the patch. Wash your hands with soap and water after contact with the patch.   Call your surgeon if you experience:   1.  Fever over 101.0. 2.  Inability to urinate. 3.  Nausea and/or vomiting. 4.  Extreme swelling or bruising at the surgical site. 5.  Continued bleeding from the incision. 6.  Increased pain, redness or drainage from the incision. 7.  Problems related to your pain medication. 8. Any change in color, movement and/or sensation 9. Any problems and/or concerns

## 2015-10-22 NOTE — Anesthesia Postprocedure Evaluation (Signed)
Anesthesia Post Note  Patient: Nancy Austin  Procedure(s) Performed: Procedure(s) (LRB): REMOVAL PORT-A-CATH (N/A) EXCISION OF FOREHEAD LESION 1X1CM (Left)  Anesthesia type: MAC  Patient location: PACU  Post pain: Pain level controlled and Adequate analgesia  Post assessment: Post-op Vital signs reviewed, Patient's Cardiovascular Status Stable and Respiratory Function Stable  Last Vitals:  Filed Vitals:   10/22/15 1047  BP: 127/80  Pulse: 51  Temp: 36.6 C  Resp: 16    Post vital signs: Reviewed and stable  Level of consciousness: awake, alert  and oriented  Complications: No apparent anesthesia complications

## 2015-10-22 NOTE — Op Note (Signed)
Preoperative diagnosis: breast cancer, no longer needs venous access; forehead mass Postoperative diagnosis: same as above Procedure: port removal, excision of 1.5x1 cm forehead mass Surgeon: Dr Serita Grammes EBL: minimal Anesthesia local mac Specimens forehead mass to pathology Drains none Complications none Sponge count correct dispo to pacu stable  Indications: This is a 21 yof presents for port removal after undergoing chemotherapy for breast cancer. She no longer needs venous access. She also has a forehead mass that has enlarged that we discussed excision for diagnosis.   Procedure: After informed consent was obtained she was taken to the or. She was placed under monitored anesthesia care. She was prepped and draped in the standard sterile surgical fashion. Timeout was performed.  I infiltrated marcaine and lidocaine in region of port and old scar. I reentered the old incision. The port and line were removed in their entirety. Hemostasis was observed. I then closed with 3-0 vicryl, 4-0 monocryl and glue.   The forehead was then prepped with betadine.  This was draped. I infiltrated local anesthetic and then made a small incision in a crease of the forehead.  I then removed what appeared to be a benign mass that was adherent to the skull.  This was passed off table. I then closed wit a 3-0 vicryl and some dermabond.  She tolerated well and case was turned to recovery in stable condition

## 2015-10-22 NOTE — H&P (Signed)
  30 yof s/p primary chemo followed by left lumpectomy/sn biopsy. now is completing herceptin and will be done on october 11. she has done well. she is on tamoxifen with hot flashes keeping her up at night. she is on neurontin for that and this is helping some. she would like port out. she also has small forehead mass she would like to consider excision   PMH  Breast cancer  Past Surgical History  Tonsillectomy  Lumpectomy/sn/port placement  Allergies  Morphine Sulfate (Concentrate) *ANALGESICS - OPIOID*  Tegaderm *MEDICAL DEVICES AND SUPPLIES*   Medication History  Pantoprazole Sodium (40MG  Tablet DR, Oral) Active.  ValACYclovir HCl (500MG  Tablet, Oral) Active.  Gabapentin (300MG  Capsule, Oral) Active.  Medications Reconciled   Social History  Tobacco use Former smoker.  No drug use  Caffeine use Coffee.   Family History  No pertinent family history   Vitals   Weight: 136.8 lb Height: 66 in  Body Surface Area: 1.7 m Body Mass Index: 22.08 kg/m  Temp.: 97.8 F (Temporal) Pulse: 58 (Regular)  BP: 124/76 (Sitting, Left Arm, Standard)  Physical Exam  Head and Neck  Note: 2 cm sub mobile nontender forehead mass  Breast  Note: right sided port in place with well healed incision   Assessment & Plan  CARCINOMA OF UPPER-OUTER QUADRANT OF FEMALE BREAST, LEFT (C50.412)  Story: will remove port under local with sedation (due to forehead mass)  MASS OF SUBCUTANEOUS TISSUE (R22.9)  Story: excise with incision directly over mass in one of forehead lines. will send to path. risk/benefits discussed

## 2015-10-22 NOTE — H&P (Deleted)
  71 yof s/p primary chemo followed by left lumpectomy/sn biopsy. now is completing herceptin and will be done on october 11. she has done well. she is on tamoxifen with hot flashes keeping her up at night. she is on neurontin for that and this is helping some. she would like port out. she also has small forehead mass she would like to consider excision   Other Problems Rolm Bookbinder, MD; 08/22/2015 11:48 AM) Christene Slates In Breast  Past Surgical History Rolm Bookbinder, MD; 08/22/2015 11:48 AM) Tonsillectomy  Diagnostic Studies History Rolm Bookbinder, MD; 08/22/2015 11:48 AM) Mammogram within last year Colonoscopy never  Allergies Davy Pique Bynum, CMA; 08/22/2015 11:08 AM) Morphine Sulfate (Concentrate) *ANALGESICS - OPIOID* Tegaderm *MEDICAL DEVICES AND SUPPLIES*  Medication History (Sonya Bynum, CMA; 08/22/2015 11:08 AM) Pantoprazole Sodium (40MG  Tablet DR, Oral) Active. ValACYclovir HCl (500MG  Tablet, Oral) Active. Gabapentin (300MG  Capsule, Oral) Active. Medications Reconciled  Social History Rolm Bookbinder, MD; 08/22/2015 11:48 AM) Tobacco use Former smoker. No drug use Caffeine use Coffee.  Family History Rolm Bookbinder, MD; 08/22/2015 11:48 AM) No pertinent family history First Degree Relatives  Vitals ([place]; 08/22/2015 11:08 AM) 08/22/2015 11:08 AM Weight: 136.8 lb Height: 66in Body Surface Area: 1.7 m Body Mass Index: 22.08 kg/m  Temp.: 97.40F(Temporal)  Pulse: 58 (Regular)  BP: 124/76 (Sitting, Left Arm, Standard)     Physical Exam Rolm Bookbinder MD; 08/22/2015 11:47 AM) Head and Neck Note: 2 cm sub mobile nontender forehead mass   Breast Note: right sided port in place with well healed incision     Assessment & Plan Rolm Bookbinder MD; 08/22/2015 11:49 AM) CARCINOMA OF UPPER-OUTER QUADRANT OF FEMALE BREAST, LEFT (C50.412) Story: will remove port under local with sedation (due to forehead mass) MASS OF  SUBCUTANEOUS TISSUE (R22.9) Story: excise with incision directly over mass in one of forehead lines. will send to path. risk/benefits discussed

## 2015-10-22 NOTE — Anesthesia Procedure Notes (Signed)
Procedure Name: MAC Date/Time: 10/22/2015 9:22 AM Performed by: Lieutenant Diego Pre-anesthesia Checklist: Patient identified, Timeout performed, Emergency Drugs available, Patient being monitored and Suction available Patient Re-evaluated:Patient Re-evaluated prior to inductionOxygen Delivery Method: Simple face mask

## 2015-10-22 NOTE — Transfer of Care (Signed)
Immediate Anesthesia Transfer of Care Note  Patient: Nancy Austin  Procedure(s) Performed: Procedure(s): REMOVAL PORT-A-CATH (N/A) EXCISION OF FOREHEAD LESION 1X1CM (Left)  Patient Location: PACU  Anesthesia Type:MAC  Level of Consciousness: awake and alert   Airway & Oxygen Therapy: Patient Spontanous Breathing and Patient connected to face mask oxygen  Post-op Assessment: Report given to RN  Post vital signs: Reviewed and stable  Last Vitals:  Filed Vitals:   10/22/15 0828  BP: 123/78  Pulse: 56  Temp: 36.8 C  Resp: 18    Complications: No apparent anesthesia complications

## 2015-10-22 NOTE — Anesthesia Preprocedure Evaluation (Signed)
Anesthesia Evaluation  Patient identified by MRN, date of birth, ID band Patient awake    Reviewed: Allergy & Precautions, NPO status , Patient's Chart, lab work & pertinent test results  Airway Mallampati: II   Neck ROM: full    Dental   Pulmonary neg pulmonary ROS,    breath sounds clear to auscultation       Cardiovascular negative cardio ROS   Rhythm:regular Rate:Normal     Neuro/Psych  Neuromuscular disease    GI/Hepatic   Endo/Other    Renal/GU      Musculoskeletal   Abdominal   Peds  Hematology   Anesthesia Other Findings   Reproductive/Obstetrics H/o breast CA s/p chemo and mastectomy.                             Anesthesia Physical Anesthesia Plan  ASA: II  Anesthesia Plan: MAC   Post-op Pain Management:    Induction: Intravenous  Airway Management Planned: Simple Face Mask  Additional Equipment:   Intra-op Plan:   Post-operative Plan:   Informed Consent: I have reviewed the patients History and Physical, chart, labs and discussed the procedure including the risks, benefits and alternatives for the proposed anesthesia with the patient or authorized representative who has indicated his/her understanding and acceptance.     Plan Discussed with: CRNA, Anesthesiologist and Surgeon  Anesthesia Plan Comments:         Anesthesia Quick Evaluation

## 2015-10-22 NOTE — Interval H&P Note (Deleted)
History and Physical Interval Note:  10/22/2015 9:09 AM  Nancy Austin  has presented today for surgery, with the diagnosis of BREAST CANCER, FOREHEAD LESION 1X1CM  The various methods of treatment have been discussed with the patient and family. After consideration of risks, benefits and other options for treatment, the patient has consented to  Procedure(s): REMOVAL PORT-A-CATH (N/A) EXCISION OF FOREHEAD LESION 1X1CM (N/A) as a surgical intervention .  The patient's history has been reviewed, patient examined, no change in status, stable for surgery.  I have reviewed the patient's chart and labs.  Questions were answered to the patient's satisfaction.     Albertus Chiarelli

## 2015-10-22 NOTE — Interval H&P Note (Signed)
History and Physical Interval Note:  10/22/2015 9:14 AM  Nancy Austin  has presented today for surgery, with the diagnosis of BREAST CANCER, FOREHEAD LESION 1X1CM  The various methods of treatment have been discussed with the patient and family. After consideration of risks, benefits and other options for treatment, the patient has consented to  Procedure(s): REMOVAL PORT-A-CATH (N/A) EXCISION OF FOREHEAD LESION 1X1CM (N/A) as a surgical intervention .  The patient's history has been reviewed, patient examined, no change in status, stable for surgery.  I have reviewed the patient's chart and labs.  Questions were answered to the patient's satisfaction.     Valree Feild

## 2015-10-23 ENCOUNTER — Encounter (HOSPITAL_BASED_OUTPATIENT_CLINIC_OR_DEPARTMENT_OTHER): Payer: Self-pay | Admitting: General Surgery

## 2015-11-03 ENCOUNTER — Encounter (HOSPITAL_COMMUNITY): Payer: Self-pay | Admitting: *Deleted

## 2015-11-04 ENCOUNTER — Ambulatory Visit: Payer: 59

## 2015-11-04 ENCOUNTER — Other Ambulatory Visit (HOSPITAL_BASED_OUTPATIENT_CLINIC_OR_DEPARTMENT_OTHER): Payer: 59

## 2015-11-04 ENCOUNTER — Other Ambulatory Visit: Payer: 59

## 2015-11-04 DIAGNOSIS — C50412 Malignant neoplasm of upper-outer quadrant of left female breast: Secondary | ICD-10-CM

## 2015-11-04 LAB — COMPREHENSIVE METABOLIC PANEL (CC13)
ALBUMIN: 3.7 g/dL (ref 3.5–5.0)
ALK PHOS: 54 U/L (ref 40–150)
ALT: 12 U/L (ref 0–55)
AST: 21 U/L (ref 5–34)
Anion Gap: 8 mEq/L (ref 3–11)
BILIRUBIN TOTAL: 0.44 mg/dL (ref 0.20–1.20)
BUN: 7.9 mg/dL (ref 7.0–26.0)
CALCIUM: 9.2 mg/dL (ref 8.4–10.4)
CO2: 28 mEq/L (ref 22–29)
CREATININE: 0.8 mg/dL (ref 0.6–1.1)
Chloride: 104 mEq/L (ref 98–109)
EGFR: >90 mL/min/{1.73_m2} (ref >90)
GLUCOSE: 111 mg/dL (ref 70–140)
POTASSIUM: 3.8 meq/L (ref 3.5–5.1)
SODIUM: 141 meq/L (ref 136–145)
TOTAL PROTEIN: 6.4 g/dL (ref 6.4–8.3)

## 2015-11-04 LAB — CBC WITH DIFFERENTIAL/PLATELET
BASO%: 0.8 % (ref 0.0–2.0)
Basophils Absolute: 0 10*3/uL (ref 0.0–0.1)
EOS ABS: 0 10*3/uL (ref 0.0–0.5)
EOS%: 1.1 % (ref 0.0–7.0)
HEMATOCRIT: 36.8 % (ref 34.8–46.6)
HGB: 12.2 g/dL (ref 11.6–15.9)
LYMPH#: 0.9 10*3/uL (ref 0.9–3.3)
LYMPH%: 28.8 % (ref 14.0–49.7)
MCH: 29.4 pg (ref 25.1–34.0)
MCHC: 33.2 g/dL (ref 31.5–36.0)
MCV: 88.5 fL (ref 79.5–101.0)
MONO#: 0.2 10*3/uL (ref 0.1–0.9)
MONO%: 5.4 % (ref 0.0–14.0)
NEUT#: 2 10*3/uL (ref 1.5–6.5)
NEUT%: 63.9 % (ref 38.4–76.8)
PLATELETS: 224 10*3/uL (ref 145–400)
RBC: 4.15 10*6/uL (ref 3.70–5.45)
RDW: 12.8 % (ref 11.2–14.5)
WBC: 3.1 10*3/uL — ABNORMAL LOW (ref 3.9–10.3)

## 2015-11-05 ENCOUNTER — Other Ambulatory Visit (HOSPITAL_COMMUNITY): Payer: 59

## 2015-11-10 ENCOUNTER — Encounter (HOSPITAL_COMMUNITY): Payer: Self-pay | Admitting: Anesthesiology

## 2015-11-10 NOTE — Anesthesia Preprocedure Evaluation (Addendum)
Anesthesia Evaluation  Patient identified by MRN, date of birth, ID band Patient awake    Reviewed: Allergy & Precautions, NPO status , Patient's Chart, lab work & pertinent test results  Airway Mallampati: I  TM Distance: >3 FB     Dental no notable dental hx. (+) Teeth Intact   Pulmonary neg pulmonary ROS,    Pulmonary exam normal breath sounds clear to auscultation       Cardiovascular Normal cardiovascular exam Rhythm:Regular Rate:Normal     Neuro/Psych Chemotherapy induced Neuropathy  Neuromuscular disease negative psych ROS   GI/Hepatic   Endo/Other  Hx/o Breast Ca  Renal/GU   negative genitourinary   Musculoskeletal negative musculoskeletal ROS (+)   Abdominal   Peds  Hematology negative hematology ROS (+)   Anesthesia Other Findings   Reproductive/Obstetrics BRCA +                            Anesthesia Physical Anesthesia Plan  ASA: II  Anesthesia Plan: General   Post-op Pain Management:    Induction: Intravenous  Airway Management Planned: Oral ETT  Additional Equipment:   Intra-op Plan:   Post-operative Plan: Extubation in OR  Informed Consent: I have reviewed the patients History and Physical, chart, labs and discussed the procedure including the risks, benefits and alternatives for the proposed anesthesia with the patient or authorized representative who has indicated his/her understanding and acceptance.   Dental advisory given  Plan Discussed with: CRNA, Anesthesiologist and Surgeon  Anesthesia Plan Comments:         Anesthesia Quick Evaluation

## 2015-11-11 ENCOUNTER — Ambulatory Visit (HOSPITAL_BASED_OUTPATIENT_CLINIC_OR_DEPARTMENT_OTHER): Payer: 59 | Admitting: Oncology

## 2015-11-11 ENCOUNTER — Other Ambulatory Visit: Payer: 59

## 2015-11-11 ENCOUNTER — Other Ambulatory Visit: Payer: Self-pay

## 2015-11-11 VITALS — BP 105/73 | HR 60 | Temp 98.1°F | Resp 18 | Ht 66.0 in | Wt 135.3 lb

## 2015-11-11 DIAGNOSIS — C50412 Malignant neoplasm of upper-outer quadrant of left female breast: Secondary | ICD-10-CM

## 2015-11-11 DIAGNOSIS — C773 Secondary and unspecified malignant neoplasm of axilla and upper limb lymph nodes: Secondary | ICD-10-CM | POA: Diagnosis not present

## 2015-11-11 DIAGNOSIS — C50812 Malignant neoplasm of overlapping sites of left female breast: Secondary | ICD-10-CM

## 2015-11-11 NOTE — Progress Notes (Signed)
Nancy Austin  Telephone:([phone]) [phone] Fax:(336) [phone]     ID: Nancy Austin DOB: 1976/06/17  MR#: [id number]  BZJ#:[id number]  Patient Care Team: Paula Compton, MD as PCP - General (Obstetrics and Gynecology) Rolm Bookbinder, MD as Consulting Physician (General Surgery) Gery Pray, MD as Consulting Physician (Radiation Oncology) Chauncey Cruel, MD as Consulting Physician (Oncology) OTHER MD:  CHIEF COMPLAINT: Triple positive breast cancer  CURRENT TREATMENT: anti-HER-2 immunotherapy; tamoxifen   BREAST CANCER HISTORY: From the original Intake note:  Nancy Austin herself found a lump in her left breast early October 2015 and brought it to her gynecologist attention. On 09/20/2014 she was set up for bilateral diagnostic mammography and left breast ultrasonography of the breast Center. This was the patient's first ever mammogram. In the area of concern in the left breast there was suspicious pleomorphic calcifications spanning 1.1 cm. There was no discrete mammographic mass in this dense breasts (category C.). On physical exam, there was a palpable firm at 1.5 cm mass at the 3:00 position in the left breast. By ultrasound this was irregular and hypoechoic and measured 1.8 cm. Ultrasound of the left axilla was unremarkable. Aside from multiple cysts in the left breast there were no other findings of concern.  On the same day, 09/20/2014, the patient underwent biopsy of the left breast palpable mass. This showed ([id number]) and invasive ductal carcinoma, grade 2, estrogen receptor 100% positive, progesterone receptor 89% positive, both with strong staining intensity, with an MIB-1 of 16%. HER-2 was amplified with a signals ratio of 5.07 and a copy number per cell of 6.85. Paragraph on 09/30/2014 the patient underwent bilateral breast MRIs. This showed a 1.7 cm mass in the upper outer quadrant of the left breast. There was no other suspicious finding in either breast and no  abnormal appearing adenopathy.  The patient's subsequent history is as detailed below.  INTERVAL HISTORY: Nancy Austin teturns today for follow-up of her estrogen receptor positive breast cancer. Since her last visit here she had a lipoma removed from her left 4 head area. The pathology was benign. She continues on tamoxifen, with good tolerance. She does have hot flashes, but they are mild. The nighttime hot flashes are well-controlled with gabapentin, which doesn't make her sleepy in the morning. She pays about $10 for a three-month supply of both drugs. Her graft she is scheduled for bilateral salpingo-oophorectomy tomorrow under Dr. Marvel Plan.  REVIEW OF SYSTEMS: Her hair is growing curly and sick and daughter. Her nails are growing nicely to except for 3 fingernails which are partially separated. Toenails are "okay". On the menopausal front, vaginal dryness is not an issue. She is exercising mostly by walking, occasionally running a little. A detailed review of systems today was otherwise stable.  PAST MEDICAL HISTORY: Past Medical History  Diagnosis Date  . History of chemotherapy     finished 02/10/2015  . History of radiation therapy 04/16/2015 - 06/02/2015  . History of breast cancer 09/2014    left  . Benign skin lesion of forehead 10/2015    PAST SURGICAL HISTORY: Past Surgical History  Procedure Laterality Date  . Tonsillectomy  1996  . Portacath placement N/A 10/15/2014    Procedure: INSERTION PORT-A-CATH;  Surgeon: Rolm Bookbinder, MD;  Location: [facility];  Service: General;  Laterality: N/A;  . Radioactive seed guided mastectomy with axillary sentinel lymph node biopsy Left 03/06/2015    Procedure: RADIOACTIVE SEED GUIDED PARTIAL MASTECTOMY WITH AXILLARY SENTINEL LYMPH NODE BIOPSY;  Surgeon: Rolm Bookbinder, MD;  Location: [place];  Service: General;  Laterality: Left;  . Port-a-cath removal N/A 10/22/2015    Procedure: REMOVAL PORT-A-CATH;  Surgeon: Rolm Bookbinder, MD;  Location: [place];  Service: General;  Laterality: N/A;  . Lesion removal Left 10/22/2015    Procedure: EXCISION OF FOREHEAD LESION 1X1CM;  Surgeon: Rolm Bookbinder, MD;  Location: [place];  Service: General;  Laterality: Left;    FAMILY HISTORY Family History  Problem Relation Age of Onset  . Skin cancer Father   . Prostate cancer Maternal Uncle 53    currently 78  . Multiple myeloma Paternal Grandmother 31    Deceased 54  The patient's parents are both living. The patient has one brother, no sisters. One grandmother was diagnosed with multiple myeloma at age 32. There is no history of breast or ovarian cancer in the family.   GYNECOLOGIC HISTORY:  No LMP recorded.  menarche age 6, first live birth age 27. The patient is GX P2. She was still having regular periods at the start of chemotherapy. She was on birth control pills on and off for the last 15 years, stopping in October of 2015.   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 Her husband Nancy Austin works as a Immunologist at [employer]. Their children are Nancy Austin age 107 and Nancy Austin age 56.    ADVANCED DIRECTIVES: In place   HEALTH MAINTENANCE: Social History  Substance Use Topics  . Smoking status: Never Smoker   . Smokeless tobacco: Never Used  . Alcohol Use: Yes     Comment: 3 x/week     Colonoscopy:  PAP:  November 2014  Bone density:  Lipid panel:  Allergies  Allergen Reactions  . Morphine And Related Nausea And Vomiting  . Tegaderm Ag Mesh [Silver] Rash    Current Outpatient Prescriptions  Medication Sig Dispense Refill  . acetaminophen (TYLENOL) 500 MG tablet Take 500 mg by mouth every 6 (six) hours as needed for mild pain.    . diphenhydrAMINE (BENADRYL) 25 MG tablet Take 25 mg by mouth at bedtime as needed for sleep.    Marland Kitchen gabapentin (NEURONTIN) 300 MG capsule Take 1 capsule (300 mg total) by mouth at bedtime. 90 capsule 3  . ibuprofen  (ADVIL,MOTRIN) 200 MG tablet Take 400 mg by mouth every 6 (six) hours as needed for moderate pain.    . Multiple Vitamin (MULTIVITAMIN) tablet Take 1 tablet by mouth daily.    . tamoxifen (NOLVADEX) 20 MG tablet Take 1 tablet (20 mg total) by mouth daily. 30 tablet 1   No current facility-administered medications for this visit.    OBJECTIVE: young white woman in no acute distress Filed Vitals:   11/11/15 0902  BP: 105/73  Pulse: 60  Temp: 98.1 F (36.7 C)  Resp: 18     Body mass index is 21.85 kg/(m^2).    ECOG FS:0 - Asymptomatic   Filed Weights   11/11/15 0902  Weight: 135 lb 4.8 oz (61.372 kg)   Sclerae unicteric, EOMs intact Oropharynx clear, dentition in good repair No cervical or supraclavicular adenopathy Lungs no rales or rhonchi Heart regular rate and rhythm Abd soft, nontender, positive bowel sounds MSK no focal spinal tenderness, no upper extremity lymphedema Neuro: nonfocal, well oriented, appropriate affect Breasts: The right breast is unremarkable. The left breast is status post lumpectomy and radiation. There is minimal hyperpigmentation. The cosmetic result is excellent. The left axilla is benign.  Skin: The nails of the fourth and fifth digits of both hands have partially separated  LAB RESULTS:  CMP     Component Value Date/Time   NA 141 11/04/2015 1111   NA 144 10/02/2014 1206   K 3.8 11/04/2015 1111   K 3.9 10/02/2014 1206   CL 104 10/02/2014 1206   CO2 28 11/04/2015 1111   CO2 26 10/02/2014 1206   GLUCOSE 111 11/04/2015 1111   GLUCOSE 103* 10/02/2014 1206   BUN 7.9 11/04/2015 1111   BUN 11 10/02/2014 1206   CREATININE 0.8 11/04/2015 1111   CREATININE 0.87 10/02/2014 1206   CALCIUM 9.2 11/04/2015 1111   CALCIUM 9.4 10/02/2014 1206   PROT 6.4 11/04/2015 1111   PROT 7.3 10/02/2014 1206   ALBUMIN 3.7 11/04/2015 1111   ALBUMIN 3.9 10/02/2014 1206   AST 21 11/04/2015 1111   AST 20 10/02/2014 1206   ALT 12 11/04/2015 1111   ALT 16 10/02/2014  1206   ALKPHOS 54 11/04/2015 1111   ALKPHOS 45 10/02/2014 1206   BILITOT 0.44 11/04/2015 1111   BILITOT 0.4 10/02/2014 1206    I No results found for: SPEP  Lab Results  Component Value Date   WBC 3.1* 11/04/2015   NEUTROABS 2.0 11/04/2015   HGB 12.2 11/04/2015   HCT 36.8 11/04/2015   MCV 88.5 11/04/2015   PLT 224 11/04/2015      Chemistry      Component Value Date/Time   NA 141 11/04/2015 1111   NA 144 10/02/2014 1206   K 3.8 11/04/2015 1111   K 3.9 10/02/2014 1206   CL 104 10/02/2014 1206   CO2 28 11/04/2015 1111   CO2 26 10/02/2014 1206   BUN 7.9 11/04/2015 1111   BUN 11 10/02/2014 1206   CREATININE 0.8 11/04/2015 1111   CREATININE 0.87 10/02/2014 1206      Component Value Date/Time   CALCIUM 9.2 11/04/2015 1111   CALCIUM 9.4 10/02/2014 1206   ALKPHOS 54 11/04/2015 1111   ALKPHOS 45 10/02/2014 1206   AST 21 11/04/2015 1111   AST 20 10/02/2014 1206   ALT 12 11/04/2015 1111   ALT 16 10/02/2014 1206   BILITOT 0.44 11/04/2015 1111   BILITOT 0.4 10/02/2014 1206       No results found for: LABCA2  No components found for: LABCA125  No results for input(s): INR in the last 168 hours.  Urinalysis No results found for: COLORURINE  STUDIES:  Transthoracic Echocardiography  Patient:  Nancy Austin, Nancy Austin MR #:    [id number] Study Date: 08/07/2015 Gender:   F Age:    15 Height:   167.6 cm Weight:   61.7 kg BSA:    1.7 m^2 Pt. Status: Room:  ATTENDING  Despina Boan, Valli Glance   Alazay Leicht, Virgie Dad REFERRING  Trenton Passow, Virgie Dad SONOGRAPHER Diamond Nickel PERFORMING  [facility], Outpatient  cc:  ------------------------------------------------------------------- LV EF: 55% -  60%  ASSESSMENT: 39 y.o. BRCA negative [place] woman s/p Left breast upper outer quadrant biopsy 09/20/2014, for a clinical T1c N0, stage IA invasive ductal carcinoma, grade 2, estrogen and progesterone receptor positive, HER-2 amplified with a  signal is ratio of 5.07, and an MIB-1 of 16%  (1) neoadjuvant treatment consisting of carboplatin, docetaxel, trastuzumab and pertuzumab given every 21 days x6,  completed 02/10/2015  (a) breast MRI 02/11/2015 showed a complete radiologic response  (2) left lumpectomy and sentinel lymph node sampling 03/06/2015 showed a residual ypT1c ypN1a, stage IIA invasive ductal carcinoma, grade 1,  with repeat prognostic panel still triple positive, and negative margins  (3) trastuzumab continued to total one year (last dose 10/01/2015)  (a) most recent echo 08/07/2015 finds an ejection fraction of 55-60%.  (4) adjuvant radiation 04/16/2015-06/02/2015 Left breast 50.4 gray in 28 fractions, axillary/supraclavicular region 45 gray in 25 fractions, lumpectomy boost 10 gray in 5 fractions  (5) started tamoxifen 06/26/2015  (a) goserelin started 04/03/2015, last dose 10/06/2015  (b) laparoscopic bilateral salpingo-oophorectomy planned 11/12/2015  (6)  the [facility] geneprofile  ([facility]) obtained November 2015 did not reveal a mutation in ATM, BARD1, BRCA1, BRCA BRIP1, CDH1, CHEK2, MRE11A, MUTYH, NBN, NF1, PALB2, PTEN, RAD50, RAD51C, RAD51D, or TP53  PLAN: Palmer is recovering from all her breast cancer treatments. She has undergone menopause with surprisingly few side effects. She is looking forward to bilateral salpingo-oophorectomy tomorrow so she will not need any further goserelin injections.  She is also tolerating the tamoxifen quite well. The plan will be to continue that a minimum of 2 years and then consider switching to an aromatase inhibitor.  I am setting her up for mammography in late February followed by her next visit here. If she then sees Dr. Donne Hazel in May, she can see [place] again in August and we can continue to "neck team her" in that fashion for the next year or year and a half, after which we can broaden the follow-up interval.  I gave her information on the "finding your new  normal" group did I think that would be helpful to her.  I do not know why the nails she has trouble wearing with other fourth and fifth digits of both hands. In any case she should have completely new normal looking nails within the next 3-4 months  She will call with any problems that may develop before her next visit here. Chauncey Cruel, MD   11/11/2015 9:09 AM

## 2015-11-11 NOTE — H&P (Signed)
Nancy Austin is an 39 y.o. female G2P2  Pt presents for bilateral salpingoophorectomy for her h/o estrogen+/progesterone +/HER 2 positive breast cancer. She had last herceptin 10/11 and porta-cath removal 10/22/15. She would like a bilateral oophorectomy to reduce her risk of future recurrence and is on zoladex to suppress her ovaries q 3 weeks currently. She will be on tamoxifen for 10 years. She is having some hot flashes, but coping with that. Sex is fine with no significant pain. She reports some slightly increased SUI and urgency over last year, but still overall mild.  Pertinent Gynecological History:  OB History: NSVD x 2   Menstrual History:  No LMP recorded. Patient is not currently having periods (Reason: Chemotherapy).    Past Medical History  Diagnosis Date  . History of chemotherapy     finished 02/10/2015  . History of radiation therapy 04/16/2015 - 06/02/2015  . History of breast cancer 09/2014    left  . Benign skin lesion of forehead 10/2015    Past Surgical History  Procedure Laterality Date  . Tonsillectomy  1996  . Portacath placement N/A 10/15/2014    Procedure: INSERTION PORT-A-CATH;  Surgeon: Rolm Bookbinder, MD;  Location: [facility];  Service: General;  Laterality: N/A;  . Radioactive seed guided mastectomy with axillary sentinel lymph node biopsy Left 03/06/2015    Procedure: RADIOACTIVE SEED GUIDED PARTIAL MASTECTOMY WITH AXILLARY SENTINEL LYMPH NODE BIOPSY;  Surgeon: Rolm Bookbinder, MD;  Location: [facility];  Service: General;  Laterality: Left;  . Port-a-cath removal N/A 10/22/2015    Procedure: REMOVAL PORT-A-CATH;  Surgeon: Rolm Bookbinder, MD;  Location: [facility];  Service: General;  Laterality: N/A;  . Lesion removal Left 10/22/2015    Procedure: EXCISION OF FOREHEAD LESION 1X1CM;  Surgeon: Rolm Bookbinder, MD;  Location: [facility] [place];  Service: General;  Laterality: Left;    Family History  Problem  Relation Age of Onset  . Skin cancer Father   . Prostate cancer Maternal Uncle 22    currently 54  . Multiple myeloma Paternal Grandmother 80    Deceased 70    Social History:  reports that she has never smoked. She has never used smokeless tobacco. She reports that she drinks alcohol. She reports that she does not use illicit drugs.  Allergies:  Allergies  Allergen Reactions  . Morphine And Related Nausea And Vomiting  . Tegaderm Ag Mesh [Silver] Rash    No prescriptions prior to admission    ROS  There were no vitals taken for this visit. Physical Exam  Constitutional: She appears well-developed.  Cardiovascular: Normal rate.   Respiratory: Effort normal.  GI: Soft.  Genitourinary: Vagina normal and uterus normal.  Neurological: She is alert.  Psychiatric: She has a normal mood and affect.    No results found for this or any previous visit (from the past 24 hour(s)).  No results found.  Assessment/Plan: d/w pt the laparoscopic BSO procedure in detail. We discussed risks and benefits of procedure including bleeding, infection and possible damage to bowel and bladder. We discussed the need for an open incision if any complication arose, and a delay in her recovery with possible longer hospitalization. She is already experiancing medical menopause and feels she can cope with these symptoms. We discussed +/- of concommitant hysterectomy . Since her only real benefit would be eliminating remote risk of uterine cancer and would add to her recovery quite a bit, she declines for now. Pt desires to proceed.  Logan Bores 11/11/2015, 6:41 PM

## 2015-11-12 ENCOUNTER — Encounter (HOSPITAL_COMMUNITY): Payer: Self-pay | Admitting: Anesthesiology

## 2015-11-12 ENCOUNTER — Ambulatory Visit (HOSPITAL_COMMUNITY)
Admission: RE | Admit: 2015-11-12 | Discharge: 2015-11-12 | Disposition: A | Discharge status: Home / Self Care | Payer: 59 | Source: Ambulatory Visit | Attending: Obstetrics and Gynecology | Admitting: Obstetrics and Gynecology

## 2015-11-12 ENCOUNTER — Ambulatory Visit (HOSPITAL_COMMUNITY): Payer: 59 | Admitting: Anesthesiology

## 2015-11-12 ENCOUNTER — Encounter (HOSPITAL_COMMUNITY)
Admission: RE | Disposition: A | Discharge status: Home / Self Care | Payer: Self-pay | Source: Ambulatory Visit | Attending: Obstetrics and Gynecology

## 2015-11-12 ENCOUNTER — Other Ambulatory Visit: Payer: Self-pay | Admitting: Oncology

## 2015-11-12 DIAGNOSIS — Z808 Family history of malignant neoplasm of other organs or systems: Secondary | ICD-10-CM | POA: Insufficient documentation

## 2015-11-12 DIAGNOSIS — Z170 Estrogen receptor positive status [ER+]: Secondary | ICD-10-CM | POA: Diagnosis present

## 2015-11-12 DIAGNOSIS — C50912 Malignant neoplasm of unspecified site of left female breast: Secondary | ICD-10-CM | POA: Insufficient documentation

## 2015-11-12 DIAGNOSIS — Z4009 Encounter for prophylactic removal of other organ: Secondary | ICD-10-CM | POA: Insufficient documentation

## 2015-11-12 DIAGNOSIS — Z8042 Family history of malignant neoplasm of prostate: Secondary | ICD-10-CM | POA: Diagnosis not present

## 2015-11-12 HISTORY — PX: LAPAROSCOPIC SALPINGO OOPHERECTOMY: SHX5927

## 2015-11-12 LAB — PREGNANCY, URINE: PREG TEST UR: NEGATIVE

## 2015-11-12 SURGERY — SALPINGO-OOPHORECTOMY, LAPAROSCOPIC
Anesthesia: General | Site: Abdomen | Laterality: Bilateral

## 2015-11-12 MED ORDER — HEPARIN SODIUM (PORCINE) 5000 UNIT/ML IJ SOLN
INTRAMUSCULAR | Status: AC
  Filled 2015-11-12: qty 1

## 2015-11-12 MED ORDER — LIDOCAINE HCL (CARDIAC) 20 MG/ML IV SOLN
INTRAVENOUS | Status: DC | PRN
  Administered 2015-11-12: 80 mg via INTRAVENOUS

## 2015-11-12 MED ORDER — GLYCOPYRROLATE 0.2 MG/ML IJ SOLN
INTRAMUSCULAR | Status: DC | PRN
  Administered 2015-11-12: 0.6 mg via INTRAVENOUS
  Administered 2015-11-12 (×2): 0.1 mg via INTRAVENOUS

## 2015-11-12 MED ORDER — GLYCOPYRROLATE 0.2 MG/ML IJ SOLN
INTRAMUSCULAR | Status: AC
  Filled 2015-11-12: qty 3

## 2015-11-12 MED ORDER — MEPERIDINE HCL 25 MG/ML IJ SOLN: 6.2500 mg | INTRAMUSCULAR | Status: DC | PRN

## 2015-11-12 MED ORDER — SCOPOLAMINE 1 MG/3DAYS TD PT72
MEDICATED_PATCH | TRANSDERMAL | Status: DC
Start: 2015-11-12 — End: 2015-11-12
  Administered 2015-11-12: 1.5 mg via TRANSDERMAL
  Filled 2015-11-12: qty 1

## 2015-11-12 MED ORDER — IBUPROFEN 600 MG PO TABS: 600.0000 mg | ORAL_TABLET | Freq: Four times a day (QID) | ORAL | Status: DC | PRN

## 2015-11-12 MED ORDER — MIDAZOLAM HCL 2 MG/2ML IJ SOLN
INTRAMUSCULAR | Status: DC | PRN
  Administered 2015-11-12 (×2): 1 mg via INTRAVENOUS

## 2015-11-12 MED ORDER — DEXAMETHASONE SODIUM PHOSPHATE 10 MG/ML IJ SOLN
INTRAMUSCULAR | Status: AC
  Filled 2015-11-12: qty 1

## 2015-11-12 MED ORDER — FENTANYL CITRATE (PF) 250 MCG/5ML IJ SOLN
INTRAMUSCULAR | Status: AC
  Filled 2015-11-12: qty 5

## 2015-11-12 MED ORDER — MIDAZOLAM HCL 2 MG/2ML IJ SOLN
INTRAMUSCULAR | Status: AC
  Filled 2015-11-12: qty 2

## 2015-11-12 MED ORDER — LACTATED RINGERS IV SOLN
INTRAVENOUS | Status: DC
  Administered 2015-11-12 (×3): via INTRAVENOUS

## 2015-11-12 MED ORDER — HYDROCODONE-ACETAMINOPHEN 7.5-325 MG PO TABS: 1.0000 | ORAL_TABLET | Freq: Once | ORAL | Status: DC | PRN

## 2015-11-12 MED ORDER — GLYCOPYRROLATE 0.2 MG/ML IJ SOLN
INTRAMUSCULAR | Status: AC
  Filled 2015-11-12: qty 1

## 2015-11-12 MED ORDER — DEXAMETHASONE SODIUM PHOSPHATE 10 MG/ML IJ SOLN
INTRAMUSCULAR | Status: DC | PRN
  Administered 2015-11-12: 8 mg via INTRAVENOUS

## 2015-11-12 MED ORDER — METOCLOPRAMIDE HCL 5 MG/ML IJ SOLN: 10.0000 mg | Freq: Once | INTRAMUSCULAR | Status: DC | PRN

## 2015-11-12 MED ORDER — FENTANYL CITRATE (PF) 100 MCG/2ML IJ SOLN
25.0000 ug | INTRAMUSCULAR | Status: DC | PRN
  Administered 2015-11-12: 50 ug via INTRAVENOUS
  Administered 2015-11-12: 25 ug via INTRAVENOUS

## 2015-11-12 MED ORDER — SCOPOLAMINE 1 MG/3DAYS TD PT72
1.0000 | MEDICATED_PATCH | Freq: Once | TRANSDERMAL | Status: DC
  Administered 2015-11-12: 1.5 mg via TRANSDERMAL

## 2015-11-12 MED ORDER — PROPOFOL 10 MG/ML IV BOLUS
INTRAVENOUS | Status: AC
  Filled 2015-11-12: qty 20

## 2015-11-12 MED ORDER — OXYCODONE-ACETAMINOPHEN 5-325 MG PO TABS
ORAL_TABLET | ORAL | Status: AC
  Filled 2015-11-12: qty 1

## 2015-11-12 MED ORDER — NEOSTIGMINE METHYLSULFATE 10 MG/10ML IV SOLN
INTRAVENOUS | Status: AC
  Filled 2015-11-12: qty 1

## 2015-11-12 MED ORDER — BUPIVACAINE HCL (PF) 0.25 % IJ SOLN
INTRAMUSCULAR | Status: DC | PRN
  Administered 2015-11-12: 14 mL

## 2015-11-12 MED ORDER — ROCURONIUM BROMIDE 100 MG/10ML IV SOLN
INTRAVENOUS | Status: DC | PRN
  Administered 2015-11-12: 35 mg via INTRAVENOUS
  Administered 2015-11-12: 5 mg via INTRAVENOUS

## 2015-11-12 MED ORDER — BUPIVACAINE HCL (PF) 0.25 % IJ SOLN
INTRAMUSCULAR | Status: AC
  Filled 2015-11-12: qty 30

## 2015-11-12 MED ORDER — OXYCODONE-ACETAMINOPHEN 5-325 MG PO TABS
1.0000 | ORAL_TABLET | ORAL | Status: DC | PRN
  Administered 2015-11-12: 1 via ORAL

## 2015-11-12 MED ORDER — ONDANSETRON HCL 4 MG/2ML IJ SOLN
INTRAMUSCULAR | Status: DC | PRN
  Administered 2015-11-12: 4 mg via INTRAVENOUS

## 2015-11-12 MED ORDER — FENTANYL CITRATE (PF) 100 MCG/2ML IJ SOLN
INTRAMUSCULAR | Status: DC | PRN
  Administered 2015-11-12: 50 ug via INTRAVENOUS
  Administered 2015-11-12 (×4): 25 ug via INTRAVENOUS
  Administered 2015-11-12: 50 ug via INTRAVENOUS

## 2015-11-12 MED ORDER — LACTATED RINGERS IV SOLN: INTRAVENOUS | Status: DC

## 2015-11-12 MED ORDER — KETOROLAC TROMETHAMINE 30 MG/ML IJ SOLN
INTRAMUSCULAR | Status: DC | PRN
  Administered 2015-11-12: 30 mg via INTRAVENOUS

## 2015-11-12 MED ORDER — FENTANYL CITRATE (PF) 100 MCG/2ML IJ SOLN
INTRAMUSCULAR | Status: AC
  Filled 2015-11-12: qty 2

## 2015-11-12 MED ORDER — ONDANSETRON HCL 4 MG/2ML IJ SOLN
INTRAMUSCULAR | Status: AC
  Filled 2015-11-12: qty 2

## 2015-11-12 MED ORDER — LIDOCAINE HCL (CARDIAC) 20 MG/ML IV SOLN
INTRAVENOUS | Status: AC
  Filled 2015-11-12: qty 5

## 2015-11-12 MED ORDER — OXYCODONE-ACETAMINOPHEN 5-325 MG PO TABS: 1.0000 | ORAL_TABLET | Freq: Four times a day (QID) | ORAL | Status: DC | PRN

## 2015-11-12 MED ORDER — KETOROLAC TROMETHAMINE 30 MG/ML IJ SOLN
INTRAMUSCULAR | Status: AC
  Filled 2015-11-12: qty 1

## 2015-11-12 MED ORDER — ROCURONIUM BROMIDE 100 MG/10ML IV SOLN
INTRAVENOUS | Status: AC
  Filled 2015-11-12: qty 1

## 2015-11-12 MED ORDER — NEOSTIGMINE METHYLSULFATE 10 MG/10ML IV SOLN
INTRAVENOUS | Status: DC | PRN
  Administered 2015-11-12: 3 mg via INTRAVENOUS

## 2015-11-12 MED ORDER — PROPOFOL 10 MG/ML IV BOLUS
INTRAVENOUS | Status: DC | PRN
  Administered 2015-11-12: 150 mg via INTRAVENOUS

## 2015-11-12 SURGICAL SUPPLY — 31 items
CABLE HIGH FREQUENCY MONO STRZ (ELECTRODE) IMPLANT
CATH ROBINSON RED A/P 16FR (CATHETERS) ×3 IMPLANT
CHLORAPREP W/TINT 26ML (MISCELLANEOUS) ×3 IMPLANT
CLOTH BEACON ORANGE TIMEOUT ST (SAFETY) ×3 IMPLANT
DECANTER SPIKE VIAL GLASS SM (MISCELLANEOUS) ×3 IMPLANT
DRSG COVADERM PLUS 2X2 (GAUZE/BANDAGES/DRESSINGS) IMPLANT
DRSG OPSITE POSTOP 3X4 (GAUZE/BANDAGES/DRESSINGS) IMPLANT
GLOVE BIO SURGEON STRL SZ 6.5 (GLOVE) ×2 IMPLANT
GLOVE BIO SURGEON STRL SZ8 (GLOVE) ×3 IMPLANT
GLOVE BIO SURGEONS STRL SZ 6.5 (GLOVE) ×1
GLOVE BIOGEL PI IND STRL 7.0 (GLOVE) ×4 IMPLANT
GLOVE BIOGEL PI INDICATOR 7.0 (GLOVE) ×8
GOWN STRL REUS W/TWL LRG LVL3 (GOWN DISPOSABLE) ×6 IMPLANT
LIQUID BAND (GAUZE/BANDAGES/DRESSINGS) ×3 IMPLANT
NEEDLE INSUFFLATION 120MM (ENDOMECHANICALS) ×3 IMPLANT
NS IRRIG 1000ML POUR BTL (IV SOLUTION) ×3 IMPLANT
PACK LAPAROSCOPY BASIN (CUSTOM PROCEDURE TRAY) ×3 IMPLANT
POUCH SPECIMEN RETRIEVAL 10MM (ENDOMECHANICALS) ×3 IMPLANT
SET IRRIG TUBING LAPAROSCOPIC (IRRIGATION / IRRIGATOR) IMPLANT
SHEARS HARMONIC ACE PLUS 36CM (ENDOMECHANICALS) ×3 IMPLANT
SLEEVE XCEL OPT CAN 5 100 (ENDOMECHANICALS) ×3 IMPLANT
SOLUTION ELECTROLUBE (MISCELLANEOUS) IMPLANT
SUT VIC AB 3-0 PS2 18 (SUTURE) ×2
SUT VIC AB 3-0 PS2 18XBRD (SUTURE) ×1 IMPLANT
SUT VICRYL 0 UR6 27IN ABS (SUTURE) ×3 IMPLANT
TOWEL OR 17X24 6PK STRL BLUE (TOWEL DISPOSABLE) ×6 IMPLANT
TROCAR BALLN 12MMX100 BLUNT (TROCAR) ×3 IMPLANT
TROCAR XCEL NON-BLD 11X100MML (ENDOMECHANICALS) IMPLANT
TROCAR XCEL NON-BLD 5MMX100MML (ENDOMECHANICALS) ×3 IMPLANT
WARMER LAPAROSCOPE (MISCELLANEOUS) ×3 IMPLANT
WATER STERILE IRR 1000ML POUR (IV SOLUTION) ×3 IMPLANT

## 2015-11-12 NOTE — Transfer of Care (Signed)
Immediate Anesthesia Transfer of Care Note  Patient: Nancy Austin  Procedure(s) Performed: Procedure(s): LAPAROSCOPIC BILATERAL SALPINGO OOPHORECTOMY (Bilateral)  Patient Location: PACU  Anesthesia Type:General  Level of Consciousness: awake, alert , oriented and patient cooperative  Airway & Oxygen Therapy: Patient Spontanous Breathing and Patient connected to nasal cannula oxygen  Post-op Assessment: Report given to RN and Post -op Vital signs reviewed and stable  Post vital signs: Reviewed and stable  Last Vitals:  Filed Vitals:   11/12/15 0615  BP: 117/92  Pulse: 65  Temp: 123XX123 C    Complications: No apparent anesthesia complications

## 2015-11-12 NOTE — Anesthesia Postprocedure Evaluation (Signed)
Anesthesia Post Note  Patient: Nancy Austin  Procedure(s) Performed: Procedure(s) (LRB): LAPAROSCOPIC BILATERAL SALPINGO OOPHORECTOMY (Bilateral)  Patient location during evaluation: PACU Anesthesia Type: General Level of consciousness: awake and alert Pain management: pain level controlled Vital Signs Assessment: post-procedure vital signs reviewed and stable Respiratory status: spontaneous breathing, nonlabored ventilation and respiratory function stable Cardiovascular status: blood pressure returned to baseline and stable Postop Assessment: no signs of nausea or vomiting Anesthetic complications: no    Last Vitals:  Filed Vitals:   11/12/15 0945 11/12/15 1000  BP: 93/59 98/63  Pulse: 49 51  Temp:    Resp: 10 12    Last Pain:  Filed Vitals:   11/12/15 1031  PainSc: 3                  Jodene Polyak A.

## 2015-11-12 NOTE — Anesthesia Procedure Notes (Signed)
Procedure Name: Intubation Date/Time: 11/12/2015 7:33 AM Performed by: Georgeanne Nim Pre-anesthesia Checklist: Patient identified, Emergency Drugs available, Suction available, Patient being monitored and Timeout performed Patient Re-evaluated:Patient Re-evaluated prior to inductionOxygen Delivery Method: Circle system utilized Preoxygenation: Pre-oxygenation with 100% oxygen Intubation Type: IV induction Ventilation: Mask ventilation without difficulty Laryngoscope Size: Mac and 3 Grade View: Grade I Tube type: Oral Tube size: 7.0 mm Number of attempts: 1 Airway Equipment and Method: Stylet Placement Confirmation: ETT inserted through vocal cords under direct vision,  positive ETCO2,  CO2 detector and breath sounds checked- equal and bilateral Secured at: 21 cm Tube secured with: Tape Dental Injury: Teeth and Oropharynx as per pre-operative assessment

## 2015-11-12 NOTE — Op Note (Signed)
Operative Note    Preoperative Diagnosis Breast Cancer--estrogen receptor positive   Postoperative Diagnosis same  Procedure Operative laparoscopy with bilateral salpingoophoerectomy  Surgeon Paula Compton [facility]  Anesthesia GETA  Fluids: EBL 50cc UOP 50cc straight cath prior to procedure IVF  1200cc  Findings Normal uterus, tubes , and ovaries.  Normal liver edge and gall bladder  Specimen Bilateral tubes and ovaries to pathology  Procedure Note Procedure  Patient was taken to the operating room where general anesthesia was obtained without difficulty. She was then prepped and draped in the normal sterile fashion in the dorsal lithotomy position. An appropriate timeout was performed. A speculum was then placed within the vagina and a Hulka tenaculum placed within the cervix for uterine manipulation. The bladder was emptied. Attention was then turned to the patient's abdomen after draping where the infraumbilical area was injected with approximately 10 cc of quarter percent Marcaine. A 2cm incision was made under the umbilicus and the fascia identified and elevated with coke clamps.  The fascia was entered sharply and the peritoneum identified and entered bluntly.   The fascia was ssecured in a pursestring suture of 0 vicryl.   The [facility] trochar was placed into the peritoneal cavity and gas flow applied to obtain pneumoperitoneum.   With patient in Trendelenburg the uterus and tubes and ovaries were inspected with findings as previously stated. Two additional ports 40mm were placed in the lateral upper quadrants under direct visualization.  Attention was turned to the left adnexa where some adhesions of the colon were taken down for better visualization of the infundibulopelvic ligament.  The infundibulopelvic ligament was then isolated and transected with the harmonic scalpel. This was carried down throught the broad ligamnet and across the uteroovarian ligament to  complete excise the adnexa from the uterus.  No bleeding was noted.  In a similar fashion the right infundibulopelvic ligament, broad ligament and uteroovarian ligament were transected.  When both adnexa were free, they were placed in the cul-de-sac.  The left adnexal pedicle was additionally cauterized for some oozing and rendered hemostatic.  The scope was then switched to the right 72mm prot and the endobag introduced into the [facility] trocar.  The free adnexa were retrieved into the bag and removed from the umbilical port with the [facility].  The umbilical incision was then tied down with the pursestring of  0 vicryl and one additional figure of eight suture placed for good fascial closure.    The remainder of the pelvis and abdomen were inspected in 4 quadrants  and found to be normal with no obvious bleeding or injuries.   The instruments were removed from the abdomen as well as the 18mm ports under direct visualization.   The pneumoperitoneum reduced through the final trocar. The trocar was finally removed and the infraumbilical incision closed with one deep stitch of 0 Vicryl and a subcuticular stitch of 3-0 Vicryl, the 74mm ports closed with 4-0 vicryl in SQ stitch.   Liquiband was placed on all sites. Patient was then awakened and taken to the recovery room in good condition.

## 2015-11-12 NOTE — Progress Notes (Signed)
Patient ID: Nancy Austin, female   DOB: 09-07-1976, 39 y.o.   MRN: [id number] Per pt no changes in dictated H&P.  Brief exam WNL. Ready to proceed,

## 2015-11-12 NOTE — Discharge Instructions (Signed)
DISCHARGE INSTRUCTIONS: Laparoscopy ° °The following instructions have been prepared to help you care for yourself upon your return home today. ° °Wound care: °• Do not get the incision wet for the first 24 hours. The incision should be kept clean and dry. °• The Band-Aids or dressings may be removed the day after surgery. °• Should the incision become sore, red, and swollen after the first week, check with your doctor. ° °Personal hygiene: °• Shower the day after your procedure. ° °Activity and limitations: °• Do NOT drive or operate any equipment today. °• Do NOT lift anything more than 15 pounds for 2-3 weeks after surgery. °• Do NOT rest in bed all day. °• Walking is encouraged. Walk each day, starting slowly with 5-minute walks 3 or 4 times a day. Slowly increase the length of your walks. °• Walk up and down stairs slowly. °• Do NOT do strenuous activities, such as golfing, playing tennis, bowling, running, biking, weight lifting, gardening, mowing, or vacuuming for 2-4 weeks. Ask your doctor when it is okay to start. ° °Diet: Eat a light meal as desired this evening. You may resume your usual diet tomorrow. ° °Return to work: This is dependent on the type of work you do. For the most part you can return to a desk job within a week of surgery. If you are more active at work, please discuss this with your doctor. ° °What to expect after your surgery: You may have a slight burning sensation when you urinate on the first day. You may have a very small amount of blood in the urine. Expect to have a small amount of vaginal discharge/light bleeding for 1-2 weeks. It is not unusual to have abdominal soreness and bruising for up to 2 weeks. You may be tired and need more rest for about 1 week. You may experience shoulder pain for 24-72 hours. Lying flat in bed may relieve it. ° °Call your doctor for any of the following: °• Develop a fever of 100.4 or greater °• Inability to urinate 6 hours after discharge from  hospital °• Severe pain not relieved by pain medications °• Persistent of heavy bleeding at incision site °• Redness or swelling around incision site after a week °• Increasing nausea or vomiting ° °Patient Signature________________________________________ °Nurse Signature_________________________________________DISCHARGE INSTRUCTIONS: Laparoscopy ° °The following instructions have been prepared to help you care for yourself upon your return home today. ° °Wound care: °• Do not get the incision wet for the first 24 hours. The incision should be kept clean and dry. °• The Band-Aids or dressings may be removed the day after surgery. °• Should the incision become sore, red, and swollen after the first week, check with your doctor. ° °Personal hygiene: °• Shower the day after your procedure. ° °Activity and limitations: °• Do NOT drive or operate any equipment today. °• Do NOT lift anything more than 15 pounds for 2-3 weeks after surgery. °• Do NOT rest in bed all day. °• Walking is encouraged. Walk each day, starting slowly with 5-minute walks 3 or 4 times a day. Slowly increase the length of your walks. °• Walk up and down stairs slowly. °• Do NOT do strenuous activities, such as golfing, playing tennis, bowling, running, biking, weight lifting, gardening, mowing, or vacuuming for 2-4 weeks. Ask your doctor when it is okay to start. ° °Diet: Eat a light meal as desired this evening. You may resume your usual diet tomorrow. ° °Return to work: This is dependent on   the type of work you do. For the most part you can return to a desk job within a week of surgery. If you are more active at work, please discuss this with your doctor. ° °What to expect after your surgery: You may have a slight burning sensation when you urinate on the first day. You may have a very small amount of blood in the urine. Expect to have a small amount of vaginal discharge/light bleeding for 1-2 weeks. It is not unusual to have abdominal soreness  and bruising for up to 2 weeks. You may be tired and need more rest for about 1 week. You may experience shoulder pain for 24-72 hours. Lying flat in bed may relieve it. ° °Call your doctor for any of the following: °• Develop a fever of 100.4 or greater °• Inability to urinate 6 hours after discharge from hospital °• Severe pain not relieved by pain medications °• Persistent of heavy bleeding at incision site °• Redness or swelling around incision site after a week °• Increasing nausea or vomiting ° °Patient Signature________________________________________ °Nurse Signature_________________________________________ ° ° ° ° °Post Anesthesia Home Care Instructions ° °Activity: °Get plenty of rest for the remainder of the day. A responsible adult should stay with you for 24 hours following the procedure.  °For the next 24 hours, DO NOT: °-Drive a car °-Operate machinery °-Drink alcoholic beverages °-Take any medication unless instructed by your physician °-Make any legal decisions or sign important papers. ° °Meals: °Start with liquid foods such as gelatin or soup. Progress to regular foods as tolerated. Avoid greasy, spicy, heavy foods. If nausea and/or vomiting occur, drink only clear liquids until the nausea and/or vomiting subsides. Call your physician if vomiting continues. ° °Special Instructions/Symptoms: °Your throat may feel dry or sore from the anesthesia or the breathing tube placed in your throat during surgery. If this causes discomfort, gargle with warm salt water. The discomfort should disappear within 24 hours. ° °If you had a scopolamine patch placed behind your ear for the management of post- operative nausea and/or vomiting: ° °1. The medication in the patch is effective for 72 hours, after which it should be removed.  Wrap patch in a tissue and discard in the trash. Wash hands thoroughly with soap and water. °2. You may remove the patch earlier than 72 hours if you experience unpleasant side effects  which may include dry mouth, dizziness or visual disturbances. °3. Avoid touching the patch. Wash your hands with soap and water after contact with the patch. °  ° °

## 2015-11-13 ENCOUNTER — Encounter (HOSPITAL_COMMUNITY): Payer: Self-pay | Admitting: Obstetrics and Gynecology

## 2015-11-25 ENCOUNTER — Ambulatory Visit: Payer: 59

## 2015-12-16 ENCOUNTER — Encounter: Payer: 59 | Admitting: Nurse Practitioner

## 2015-12-16 ENCOUNTER — Ambulatory Visit: Payer: 59

## 2015-12-17 ENCOUNTER — Telehealth: Payer: Self-pay | Admitting: Nurse Practitioner

## 2015-12-17 ENCOUNTER — Encounter: Payer: Self-pay | Admitting: Nurse Practitioner

## 2015-12-17 DIAGNOSIS — C50412 Malignant neoplasm of upper-outer quadrant of left female breast: Secondary | ICD-10-CM

## 2015-12-17 NOTE — Progress Notes (Signed)
The Survivorship Care Plan was mailed to Ms. Latendresse as she was unable to come in to the Survivorship Clinic for an in-person visit at this time. A letter was mailed to her outlining the purpose of the content of the care plan, as well as encouraging her to reach out to me with any questions or concerns.  My business card was included in the correspondence to the patient as well.  A copy of the care plan was also routed/faxed/mailed to Logan Bores, MD, the patient's PCP.  I will not be placing any follow-up appointments to the Survivorship Clinic for Ms. Kindig, but I am happy to see her at any time in the future for any survivorship concerns that may arise. Thank you for allowing me to participate in her care!  Kenn File, [place] [phone]

## 2015-12-17 NOTE — Telephone Encounter (Signed)
Called and spoke with patient regarding missed visit for survivorship care plan review on 12/16/15.  Pt doing well overall.  Had general questions related to upcoming ECHO and side effects of tamoxifen.  Answered patient's questions.  Will mail copy of survivorship care plan to her at her request in lieu of in person visit.  Will include contact information in the event patient has additional questions.  Pt without questions at this time.

## 2016-01-01 ENCOUNTER — Ambulatory Visit
Admission: RE | Admit: 2016-01-01 | Discharge: 2016-01-01 | Disposition: A | Discharge status: Home / Self Care | Payer: 59 | Source: Ambulatory Visit | Attending: Oncology | Admitting: Oncology

## 2016-01-01 ENCOUNTER — Other Ambulatory Visit: Payer: Self-pay | Admitting: Oncology

## 2016-01-01 DIAGNOSIS — C50412 Malignant neoplasm of upper-outer quadrant of left female breast: Secondary | ICD-10-CM

## 2016-01-01 DIAGNOSIS — N63 Unspecified lump in breast: Secondary | ICD-10-CM | POA: Diagnosis not present

## 2016-01-06 ENCOUNTER — Ambulatory Visit: Payer: 59

## 2016-01-09 ENCOUNTER — Encounter (HOSPITAL_COMMUNITY): Payer: Self-pay

## 2016-01-09 ENCOUNTER — Ambulatory Visit (HOSPITAL_BASED_OUTPATIENT_CLINIC_OR_DEPARTMENT_OTHER)
Admission: RE | Admit: 2016-01-09 | Discharge: 2016-01-09 | Disposition: A | Discharge status: Home / Self Care | Payer: 59 | Source: Ambulatory Visit | Attending: Cardiology | Admitting: Cardiology

## 2016-01-09 ENCOUNTER — Ambulatory Visit (HOSPITAL_COMMUNITY)
Admission: RE | Admit: 2016-01-09 | Discharge: 2016-01-09 | Disposition: A | Discharge status: Home / Self Care | Payer: 59 | Source: Ambulatory Visit | Attending: Obstetrics and Gynecology | Admitting: Obstetrics and Gynecology

## 2016-01-09 VITALS — BP 100/62 | HR 64 | Wt 126.0 lb

## 2016-01-09 DIAGNOSIS — Z923 Personal history of irradiation: Secondary | ICD-10-CM | POA: Insufficient documentation

## 2016-01-09 DIAGNOSIS — C50412 Malignant neoplasm of upper-outer quadrant of left female breast: Secondary | ICD-10-CM | POA: Insufficient documentation

## 2016-01-09 DIAGNOSIS — Z885 Allergy status to narcotic agent status: Secondary | ICD-10-CM | POA: Insufficient documentation

## 2016-01-09 DIAGNOSIS — Z79899 Other long term (current) drug therapy: Secondary | ICD-10-CM | POA: Insufficient documentation

## 2016-01-09 DIAGNOSIS — Z9221 Personal history of antineoplastic chemotherapy: Secondary | ICD-10-CM | POA: Diagnosis not present

## 2016-01-09 DIAGNOSIS — Z170 Estrogen receptor positive status [ER+]: Secondary | ICD-10-CM | POA: Diagnosis not present

## 2016-01-09 NOTE — Progress Notes (Signed)
  Echocardiogram 2D Echocardiogram has been performed.  Jennette Dubin 01/09/2016, 9:50 AM

## 2016-01-09 NOTE — Patient Instructions (Signed)
Follow up as needed

## 2016-01-10 NOTE — Progress Notes (Signed)
[name] NOTE  Patient ID: Nancy Austin, female   DOB: 07/12/1976, 40 y.o.   MRN: [id number]  Oncologist:  Dr Jana Hakim   HPI:  Nancy Austin is a delightful 40 y/o woman with no significant PMHx except for recently diagnosed breast CA. She herself found a lump in her left breast early October 2015. Biopsy of the lump showed ([id number] [phone]) and invasive ductal carcinoma, grade 2, estrogen receptor 100% positive, progesterone receptor 89% positive, both with strong staining intensity, with an MIB-1 of 16%. HER-2 was amplified with a signals ratio of 5.07 and a copy number per cell of 6.85. Paragraph on 09/30/2014 the patient underwent bilateral breast MRIs. This showed a 1.7 cm mass in the upper outer quadrant of the left breast. There was no other suspicious finding in either breast and no abnormal appearing adenopathy.  Treatment history: (1) neoadjuvant treatment consisting of carboplatin, docetaxel, trastuzumab and pertuzumab given every 21 days x6, completed 02/10/2015 (a) breast MRI 02/11/2015 showed a complete radiologic response (2) left lumpectomy and sentinel lymph node sampling 03/06/2015 showed a residual ypT1c ypN1a, stage IIA invasive ductal carcinoma, grade 1, with repeat prognostic panel still triple positive, and negative margins (3) trastuzumab to be continued to total one year (through October 2016) (4) adjuvant radiation to be completed 06/02/2015 (5) antiestrogens to follow radiation (a) goserelin started 04/03/2015 (b) to start tamoxifen 06/26/2015 (6) BSO 11/16  She denies any h/o known cardiac disease. She was formerly a runner and hopes to get back to this. No HF symptoms.  She has now finished with Herceptin.   Echo from 04/17/15 and today reviewed personally in clinic Echo 08/08/15 EF 60% GLS -19.8% mild TR Echo 1/17 with EF 74-94%, normal diastolic function, lateral s' 10.7, GLS -22.9%, normal RV.   Review of  Systems:  All systems reviewed and negative except as per HPI.  Past Medical History  Diagnosis Date  . History of chemotherapy     finished 02/10/2015  . History of radiation therapy 04/16/2015 - 06/02/2015  . History of breast cancer 09/2014    left  . Benign skin lesion of forehead 10/2015    Current Outpatient Prescriptions  Medication Sig Dispense Refill  . gabapentin (NEURONTIN) 300 MG capsule Take 1 capsule (300 mg total) by mouth at bedtime. 90 capsule 3  . ibuprofen (ADVIL,MOTRIN) 600 MG tablet Take 1 tablet (600 mg total) by mouth every 6 (six) hours as needed. 30 tablet 0  . Multiple Vitamin (MULTIVITAMIN) tablet Take 1 tablet by mouth daily.    . tamoxifen (NOLVADEX) 20 MG tablet Take 1 tablet (20 mg total) by mouth daily. 30 tablet 1  . diphenhydrAMINE (BENADRYL) 25 MG tablet Take 25 mg by mouth at bedtime as needed for sleep. Reported on 01/09/2016    . oxyCODONE-acetaminophen (ROXICET) 5-325 MG tablet Take 1-2 tablets by mouth every 6 (six) hours as needed for severe pain. (Patient not taking: Reported on 01/09/2016) 30 tablet 0   No current facility-administered medications for this encounter.    Allergies  Allergen Reactions  . Morphine And Related Nausea And Vomiting  . Tegaderm Ag Mesh [Silver] Rash      Social History   Social History  . Marital Status: Married    Spouse Name: N/A  . Number of Children: 2  . Years of Education: N/A   Occupational History  . Not on file.   Social History Main Topics  . Smoking status: Never Smoker   . Smokeless tobacco: Never  Used  . Alcohol Use: Yes     Comment: 3 x/week  . Drug Use: No  . Sexual Activity: Not on file   Other Topics Concern  . Not on file   Social History Narrative      Family History  Problem Relation Age of Onset  . Skin cancer Father   . Prostate cancer Maternal Uncle 1    currently 25  . Multiple myeloma Paternal Grandmother 64    Deceased 85    Filed Vitals:   01/09/16 0955  BP:  100/62  Pulse: 64  Weight: 126 lb (57.153 kg)    PHYSICAL EXAM: General:  Well appearing. No respiratory difficulty HEENT: normal Neck: supple. no JVD. Carotids 2+ bilat; no bruits. No lymphadenopathy or thryomegaly appreciated. Cor: PMI nondisplaced. Regular rate & rhythm. No rubs, gallops or murmurs. Lungs: clear Abdomen: soft, nontender, nondistended. No hepatosplenomegaly. No bruits or masses. Good bowel sounds. Extremities: no cyanosis, clubbing, rash, edema Neuro: alert & oriented x 3, cranial nerves grossly intact. moves all 4 extremities w/o difficulty. Affect pleasant.   ASSESSMENT & PLAN: 1. Breast CA - triple + :  She completed Herceptin treatment in 10/16.  I reviewed today's echo: stable EF and global longitudinal strain.  She has no CHF symptoms.  She will not need further echo screening and can followup as needed.  Dalton French Ana 01/10/2016

## 2016-01-29 DIAGNOSIS — C50412 Malignant neoplasm of upper-outer quadrant of left female breast: Secondary | ICD-10-CM | POA: Diagnosis not present

## 2016-02-02 ENCOUNTER — Encounter: Payer: Self-pay | Admitting: *Deleted

## 2016-02-02 ENCOUNTER — Telehealth: Payer: Self-pay | Admitting: Oncology

## 2016-02-02 NOTE — Telephone Encounter (Signed)
Spoke with pt to inform her of the change in her appt date/time per 2/20 pof

## 2016-02-03 ENCOUNTER — Other Ambulatory Visit: Payer: 59

## 2016-02-04 ENCOUNTER — Other Ambulatory Visit: Payer: Self-pay | Admitting: Oncology

## 2016-02-10 ENCOUNTER — Ambulatory Visit: Payer: 59 | Admitting: Oncology

## 2016-02-11 DIAGNOSIS — D225 Melanocytic nevi of trunk: Secondary | ICD-10-CM | POA: Diagnosis not present

## 2016-02-11 DIAGNOSIS — D485 Neoplasm of uncertain behavior of skin: Secondary | ICD-10-CM | POA: Diagnosis not present

## 2016-04-16 ENCOUNTER — Other Ambulatory Visit: Payer: Self-pay | Admitting: Nurse Practitioner

## 2016-04-20 ENCOUNTER — Other Ambulatory Visit: Payer: Self-pay | Admitting: Oncology

## 2016-04-27 LAB — HM PAP SMEAR

## 2016-05-26 ENCOUNTER — Ambulatory Visit
Admission: RE | Admit: 2016-05-26 | Discharge: 2016-05-26 | Disposition: A | Discharge status: Home / Self Care | Payer: 59 | Source: Ambulatory Visit | Attending: Family Medicine | Admitting: Family Medicine

## 2016-05-26 ENCOUNTER — Encounter: Payer: Self-pay | Admitting: Family Medicine

## 2016-05-26 ENCOUNTER — Ambulatory Visit (INDEPENDENT_AMBULATORY_CARE_PROVIDER_SITE_OTHER): Payer: 59 | Admitting: Family Medicine

## 2016-05-26 VITALS — BP 110/70 | Ht 66.0 in | Wt 135.0 lb

## 2016-05-26 DIAGNOSIS — M545 Low back pain, unspecified: Secondary | ICD-10-CM

## 2016-05-26 DIAGNOSIS — M5136 Other intervertebral disc degeneration, lumbar region: Secondary | ICD-10-CM | POA: Diagnosis not present

## 2016-05-26 MED ORDER — MELOXICAM 15 MG PO TABS: 15.0000 mg | ORAL_TABLET | Freq: Every day | ORAL | Status: DC

## 2016-05-26 NOTE — Assessment & Plan Note (Addendum)
Patient with low back pain concerning for sacroiliac joint dysfunction as well as possible lumbar radiculopathy into her thighs. Her hip range of motion and hip joint itself were normal. With her history of breast cancer, I would be more inclined to get MRI imaging sooner. However that being said she has no red flags on history or exam today. We discussed all MRI today versus conservative management and she would like to start conservative management first before jumping to advanced imaging.  Will start with lumbar imaging. -Start with physical therapy, Mobic daily, exercises and strengthening especially of the sacroiliac joint and continuing on Neurontin. -Follow-up in 4 weeks and if no improvement I would highly recommend MRI. If this is only coming from her SI joint, we could consider bilateral sacroiliac joint injection under ultrasound guidance.

## 2016-05-26 NOTE — Progress Notes (Signed)
Patient ID: Nancy Austin, female   DOB: Apr 04, 1976, 40 y.o.   MRN: [id number]  CC: Low back pain going down legs  HPI: Nancy Austin is a 40 y.o. yo female with PMH of breast cancer s/p chemo in remission presenting with longstanding low back pain acutely worsened.  Pt states she has had on and off low back pain for 10 years, since birth of her child, worse in last 10 days.  Pain is central low back and sends radiating pain down front of both legs to knees, L > R.  Pain described as an "ache" down to knees, never down to feet.  Back pain worse with lying/sitting, better with standing/walking/activity.  Has found gabapentin, massage, yoga, stretching, and avoiding running to all be helpful.  No medications tried.  Has had some urge incontinence she thinks as a result of chemo drugs, but this has worsened since onset of worse back pain, also pain wakes her up out of sleep.  Denies saddle anesthesia or unexplained weight loss.    ROS: Gen: - fevers/chills MSK: - except as per HPI Neuro: no change in gait  PE: BP 110/70 mmHg  Ht 5\' 6"  (1.676 m)  Wt 135 lb (61.236 kg)  BMI 21.80 kg/m2 Gen: 40 y.o. yo female sitting one exam table in NAD HEENT: normocephalic, atraumatic Pulm: normal work of breathing MSK:  Back: inspection reveals no abnormalities, + TTP over lower lumbar vertebrae, limited back flexion (~10 degrees) 2/2 pain, full extension and L/R lateral ROM, - straight leg raise bilaterally RLE: 2+ knee and achilles DTRs, 5/5 strength knee extension/flexion, foot dorsiflexion/plantarflexion, great toe extension LLE: 2+ knee and achilles DTRs, 5/5 strength knee extension/flexion, foot dorsiflexion/plantarflexion, great toe extension Gait: normal   A/P: Nancy Austin is a 40 y.o. yo female with PMH of BC in remission presenting with acute on chronic low back pain of unclear etiology.  Low back pain Etiology uncertain, could be discogenic, radiculopathy, SI joint pain, vs unlikely BC mets. --  Offered pt option of trying PT, exercises, stretching, Mobic, and X-rays vs obtaining MRI -- Pt elected to proceed with first option -- Already on gabapentin and helps, advised to keep taking -- Advised to come back sooner if symptoms worsen  Follow-up: one month  Rosary Filosa, MS4   I agree with the above note and have examine the patient/discussed case. In addition, Pt denies any current bowel/bladder problems, fever, chills, unintentional weight loss, night time awakenings secondary to pain, weakness in one or both legs.  On exam she does have tenderness palpation at the SI joint bilateral. Positive Faber test to the posterior spine as well. Most likely her symptoms are multifactorial including SI joint dysfunction as well as lumbar mechanical back pain. Please see assessment and plan for further information.

## 2016-06-01 ENCOUNTER — Telehealth: Payer: Self-pay | Admitting: Family Medicine

## 2016-06-01 NOTE — Telephone Encounter (Signed)
-----   Message from Sela Hua, RN sent at 05/31/2016  4:39 PM EDT ----- Regarding: FW: xray results Contact: [phone] Will you call her with results, or is there something you want me to tell her?  ----- Message -----    From: Carolyne Littles    Sent: 05/31/2016   4:23 PM      To: Sela Hua, RN Subject: xray results                                   Pt called for lower back xray results

## 2016-06-01 NOTE — Telephone Encounter (Signed)
LVM to call back at her convience.  Tamela Oddi Nachum Derossett, DO of [facility]  06/01/2016, 3:43 PM

## 2016-06-02 DIAGNOSIS — Z130 Encounter for screening for diseases of the blood and blood-forming organs and certain disorders involving the immune mechanism: Secondary | ICD-10-CM | POA: Diagnosis not present

## 2016-06-02 DIAGNOSIS — Z01419 Encounter for gynecological examination (general) (routine) without abnormal findings: Secondary | ICD-10-CM | POA: Diagnosis not present

## 2016-06-02 DIAGNOSIS — Z1151 Encounter for screening for human papillomavirus (HPV): Secondary | ICD-10-CM | POA: Diagnosis not present

## 2016-06-02 DIAGNOSIS — Z124 Encounter for screening for malignant neoplasm of cervix: Secondary | ICD-10-CM | POA: Diagnosis not present

## 2016-06-02 DIAGNOSIS — Z1389 Encounter for screening for other disorder: Secondary | ICD-10-CM | POA: Diagnosis not present

## 2016-06-02 DIAGNOSIS — Z6821 Body mass index (BMI) 21.0-21.9, adult: Secondary | ICD-10-CM | POA: Diagnosis not present

## 2016-06-07 ENCOUNTER — Ambulatory Visit: Payer: 59 | Attending: Family Medicine | Admitting: Physical Therapy

## 2016-06-07 DIAGNOSIS — R293 Abnormal posture: Secondary | ICD-10-CM | POA: Diagnosis not present

## 2016-06-07 DIAGNOSIS — M545 Low back pain, unspecified: Secondary | ICD-10-CM

## 2016-06-07 NOTE — Patient Instructions (Signed)
Pt was issued information on Core Stability. Lumbar Stability 1, Anatomy:  transverse abdominis, multifidi, pelvic floor Clam, March, and Heel Slide

## 2016-06-07 NOTE — Therapy (Signed)
[place], [place], [address] Phone: [phone]   Fax:  [phone]  Physical Therapy Evaluation  Patient Details  Name: Nancy Austin MRN: [id number] Date of Birth: 1976/03/01 Referring Provider: Kennith Maes, DO  Encounter Date: 06/07/2016      PT End of Session - 06/07/16 1514    Visit Number 1   Number of Visits 8   Date for PT Re-Evaluation 08/06/16   PT Start Time 1414   PT Stop Time 1502   PT Time Calculation (min) 48 min   Activity Tolerance Patient tolerated treatment well   Behavior During Therapy [facility] for tasks assessed/performed      Past Medical History  Diagnosis Date  . History of chemotherapy     finished 02/10/2015  . History of radiation therapy 04/16/2015 - 06/02/2015  . History of breast cancer 09/2014    left  . Benign skin lesion of forehead 10/2015    Past Surgical History  Procedure Laterality Date  . Tonsillectomy  1996  . Portacath placement N/A 10/15/2014    Procedure: INSERTION PORT-A-CATH;  Surgeon: Rolm Bookbinder, MD;  [facility];  Service: General;  Laterality: N/A;  . Radioactive seed guided mastectomy with axillary sentinel lymph node biopsy Left 03/06/2015    Procedure: RADIOACTIVE SEED GUIDED PARTIAL MASTECTOMY WITH AXILLARY SENTINEL LYMPH NODE BIOPSY;  Surgeon: Rolm Bookbinder, MD;  Location: [place];  Service: General;  Laterality: Left;  . Port-a-cath removal N/A 10/22/2015    Procedure: REMOVAL PORT-A-CATH;  Surgeon: Rolm Bookbinder, MD;  Location: [place];  Service: General;  Laterality: N/A;  . Lesion removal Left 10/22/2015    Procedure: EXCISION OF FOREHEAD LESION 1X1CM;  Surgeon: Rolm Bookbinder, MD;  Location: [place];  Service: General;  Laterality: Left;  . Laparoscopic salpingo oopherectomy Bilateral 11/12/2015    Procedure: LAPAROSCOPIC BILATERAL SALPINGO OOPHORECTOMY;  Surgeon: Paula Compton, MD;  Location: [place] [facility];  Service: Gynecology;  Laterality: Bilateral;    There were no vitals filed for this visit.       Subjective Assessment - 06/07/16 1417    Subjective Pt arriving to PT reporting low back pain off and on for about 10 years. Pt reporting seeing Dr. Awanda Mink about 2 weeks ago and reporting inability to bend forward. Pt wishing to get a HEP to help with her LBP. Pt reporting her pain was 9/10 2 weeks agao and has improved  to 2/10.    How long can you sit comfortably? unlimited   How long can you stand comfortably? unlimited   How long can you walk comfortably? unlimited   Diagnostic tests 05/26/16   Patient Stated Goals Create a HEP   Currently in Pain? Yes   Pain Score 2    Pain Location Back   Pain Orientation Lower   Pain Descriptors / Indicators Discomfort   Pain Type Chronic pain   Pain Radiating Towards 2 weeks ago pt reporting pain radiating down her Left leg   Pain Onset 1 to 4 weeks ago   Pain Frequency Constant   Aggravating Factors  bending forward   Pain Relieving Factors walking, moving   Effect of Pain on Daily Activities unable to run   Multiple Pain Sites No            [facility] PT Assessment - 06/07/16 0001    Assessment   Medical Diagnosis Low Back Pain   Referring Provider Bryan hess, DO  Onset Date/Surgical Date --  3 weeks ago, chronic   Prior Therapy Yes following BC    Precautions   Precautions None   Restrictions   Weight Bearing Restrictions No   Balance Screen   Has the patient fallen in the past 6 months No   Prior Function   Level of Independence Independent   Leisure fitness activities   Cognition   Overall Cognitive Status Within Functional Limits for tasks assessed   Posture/Postural Control   Posture Comments pt with mild elevation of R anterior ASIS on the left   AROM   AROM Assessment Site Hip;Lumbar   Lumbar Flexion 60   Lumbar Extension 30   Lumbar - Right Side Bend 25   Lumbar - Left Side Bend 25    Lumbar - Right Rotation WFL   Lumbar - Left Rotation WFL   Strength   Overall Strength Within functional limits for tasks performed   Flexibility   Hamstrings 75-80 degrees    Palpation   Spinal mobility hypomobile in thoracic spine, hypermobility in lumbar spine   Palpation comment Standing L ASIS was elevated slightly   Special Tests    Special Tests Lumbar   Lumbar Tests FABER test;Prone Knee Bend Test;Straight Leg Raise   FABER test   findings Negative   Comment bilateral   Prone Knee Bend Test   Findings Negative   Comment bilateral   Straight Leg Raise   Findings Negative   Comment bilateral                           PT Education - 06/07/16 1513    Education provided Yes   Education Details pt was issued handout for Lumbar stabilty HEP  and anatomy. PT/POC, core    Person(s) Educated Patient   Methods Explanation;Demonstration;Handout   Comprehension Verbalized understanding;Returned demonstration;Need further instruction          PT Short Term Goals - 06/07/16 1530    PT SHORT TERM GOAL #1   Title Pt will be independent with her HEP and progress   Baseline issued pt new HEP for Core stability today during initial eval   Time 4   Period Weeks   Status New           PT Long Term Goals - 06/07/16 1531    PT LONG TERM GOAL #1   Title Pt will report no low back pain with daily activities   Baseline 2/10 pain during today's evaluation   Time 8   Period Weeks   Status New   PT LONG TERM GOAL #2   Title Pt will improve her FOTO score from 34% limitation to 19% limitation in order to progress toward PLOF   Baseline 49% limited   Time 8   Period Weeks   Status New   PT LONG TERM GOAL #3   Title Pt will demonstrate good posture with body mechanics in order to return to fitness leisure activities (yoga/palates)   Baseline pt unable to participate in classes at present   Time 8   Period Weeks   Status New               Plan  - 06/07/16 1517    Clinical Impression Statement Pt presents to therapy c/o 2/10 low back pain which is chronic and was worse approximately 2-3 weeks ago. Pt with mild elevated Left ASIS and decreased abdominal core strenght. Pt also noted with  hypo mobile thoracic spine and hyper mobille lumbar spine. Pt would benefit from PT for HEP progression for core stability.    Rehab Potential Good   PT Frequency 1x / week   PT Duration 8 weeks  6-8 visits as needed   PT Treatment/Interventions Other (comment);Therapeutic exercise;Therapeutic activities;Patient/family education;Passive range of motion;Manual techniques;ADLs/Self Care Home Management   PT Next Visit Plan Review HEP, Palates Reformer   PT Home Exercise Plan Issued pt Core Stability handout with anatomy   Consulted and Agree with Plan of Care Patient      Patient will benefit from skilled therapeutic intervention in order to improve the following deficits and impairments:  Decreased activity tolerance, Decreased mobility, Decreased strength, Hypermobility, Hypomobility, Postural dysfunction, Pain  Visit Diagnosis: Midline low back pain without sciatica  Abnormal posture     Problem List Patient Active Problem List   Diagnosis Date Noted  . Low back pain at multiple sites 05/26/2016  . Genetic testing 10/17/2015  . Chemotherapy induced neutropenia ([facility]) 02/03/2015  . Neuropathy due to chemotherapeutic drug ([place]) 01/13/2015  . Constipation 12/09/2014  . Hot flashes 12/09/2014  . Spotting 11/20/2014  . Vasovagal near syncope 11/16/2014  . Breast cancer of upper-outer quadrant of left female breast [facility]) 09/24/2014    Montez Morita 06/07/2016, 3:43 PM  [facility] [address] [place], [place], [address] Phone: [phone]   Fax:  [phone]  Name: Nancy Austin MRN: [id number] Date of Birth: March 27, 1976

## 2016-06-22 ENCOUNTER — Ambulatory Visit: Payer: 59 | Attending: Family Medicine | Admitting: Physical Therapy

## 2016-06-22 DIAGNOSIS — R293 Abnormal posture: Secondary | ICD-10-CM | POA: Insufficient documentation

## 2016-06-22 DIAGNOSIS — M545 Low back pain, unspecified: Secondary | ICD-10-CM

## 2016-06-22 DIAGNOSIS — M25512 Pain in left shoulder: Secondary | ICD-10-CM | POA: Diagnosis not present

## 2016-06-22 DIAGNOSIS — M25612 Stiffness of left shoulder, not elsewhere classified: Secondary | ICD-10-CM | POA: Insufficient documentation

## 2016-06-22 NOTE — Therapy (Addendum)
[place], [place], [address] Phone: [phone]   Fax:  [phone]  Physical Therapy Treatment/Discharge  Patient Details  Name: RANITA STJULIEN MRN: [id number] Date of Birth: December 26, 1975 Referring Provider: Kennith Maes, DO  Encounter Date: 06/22/2016      PT End of Session - 06/22/16 1101    Visit Number 2   Number of Visits 8   Date for PT Re-Evaluation 08/06/16   PT Start Time 1016   PT Stop Time 1102   PT Time Calculation (min) 46 min   Activity Tolerance Patient tolerated treatment well   Behavior During Therapy [facility] for tasks assessed/performed      Past Medical History  Diagnosis Date  . History of chemotherapy     finished 02/10/2015  . History of radiation therapy 04/16/2015 - 06/02/2015  . History of breast cancer 09/2014    left  . Benign skin lesion of forehead 10/2015    Past Surgical History  Procedure Laterality Date  . Tonsillectomy  1996  . Portacath placement N/A 10/15/2014    Procedure: INSERTION PORT-A-CATH;  Surgeon: Rolm Bookbinder, MD;  [facility];  Service: General;  Laterality: N/A;  . Radioactive seed guided mastectomy with axillary sentinel lymph node biopsy Left 03/06/2015    Procedure: RADIOACTIVE SEED GUIDED PARTIAL MASTECTOMY WITH AXILLARY SENTINEL LYMPH NODE BIOPSY;  Surgeon: Rolm Bookbinder, MD;  Location: [place];  Service: General;  Laterality: Left;  . Port-a-cath removal N/A 10/22/2015    Procedure: REMOVAL PORT-A-CATH;  Surgeon: Rolm Bookbinder, MD;  Location: [place];  Service: General;  Laterality: N/A;  . Lesion removal Left 10/22/2015    Procedure: EXCISION OF FOREHEAD LESION 1X1CM;  Surgeon: Rolm Bookbinder, MD;  Location: [place];  Service: General;  Laterality: Left;  . Laparoscopic salpingo oopherectomy Bilateral 11/12/2015    Procedure: LAPAROSCOPIC BILATERAL SALPINGO OOPHORECTOMY;  Surgeon:  Paula Compton, MD;  Location: [place] [facility];  Service: Gynecology;  Laterality: Bilateral;    There were no vitals filed for this visit.      Subjective Assessment - 06/22/16 1020    Subjective No pain today.  [place] the beach [place].    Currently in Pain? No/denies            [facility] Adult PT Treatment/Exercise - 06/22/16 1027    Lumbar Exercises: Supine   Ab Set 10 reps   Clam 10 reps   Clam Limitations 2 sets   Bent Knee Raise 10 reps   Bridge 10 reps   Bridge Limitations articulation focus on middle back    Straight Leg Raise 10 reps   Straight Leg Raises Limitations foam roller    Other Supine Lumbar Exercises foam roller: UE alternating flexion, dead bug, clam bilat and unilat, diagonal core (opp arm and leg flex/ext)    Other Supine Lumbar Exercises thoracic mobility with horiz foam roller       Pilates Reformer used for LE/core strength, postural strength, lumbopelvic disassociation and core control.  Exercises included: Footwork 2 Red 1 Blue parallel and turn out , heels and forefoot Bridging  2 red 1 blue cues for thoracic mobility x 10  Supine Arms 1 red 1 yellow x 10 Arcs, removed 1 yellow for a few T arcs.  Min pain in Rt. Lumbar spine at the end of exercise.  Needs min cues to engage transverse abdominus and exhale.  PT Education - 06/22/16 1108    Education provided Yes   Education Details core progression and foam roller and PIlates Reformer    Person(s) Educated Patient   Methods Explanation;Demonstration;Tactile cues;Handout   Comprehension Verbalized understanding;Returned demonstration          PT Short Term Goals - 06/22/16 1112    PT SHORT TERM GOAL #1   Title Pt will be independent with her HEP and progress   Status Achieved           PT Long Term Goals - 06/22/16 1112    PT LONG TERM GOAL #1   Title Pt will report no low back pain with daily activities   Status Partially Met   PT LONG TERM GOAL #2   Title Pt will  improve her FOTO score from 34% limitation to 19% limitation in order to progress toward PLOF   Status On-going   PT LONG TERM GOAL #3   Title Pt will demonstrate good posture with body mechanics in order to return to fitness leisure activities (yoga/palates)   Status On-going               Plan - 06/22/16 1111    Clinical Impression Statement Pt is doing well, has not had pain in about 2 weeks.  She would like to work on developing a good core routine with 1-2 more visits, may consider buying home equipment or doing Pilates in community.        Patient will benefit from skilled therapeutic intervention in order to improve the following deficits and impairments:     Visit Diagnosis: Midline low back pain without sciatica  Abnormal posture  Left shoulder pain  Shoulder stiffness, left     Problem List Patient Active Problem List   Diagnosis Date Noted  . Low back pain at multiple sites 05/26/2016  . Genetic testing 10/17/2015  . Chemotherapy induced neutropenia ([place]) 02/03/2015  . Neuropathy due to chemotherapeutic drug ([place]) 01/13/2015  . Constipation 12/09/2014  . Hot flashes 12/09/2014  . Spotting 11/20/2014  . Vasovagal near syncope 11/16/2014  . Breast cancer of upper-outer quadrant of left female breast ([place]) 09/24/2014    Seth Higginbotham 06/22/2016, 11:15 AM  [facility] [address] [place], [place], [address] Phone: [phone]   Fax:  [phone]  Name: ANTIA RAHAL MRN: [id number] Date of Birth: 04/28/1976    PHYSICAL THERAPY DISCHARGE SUMMARY  Visits from Start of Care: 2  Current functional level related to goals / functional outcomes: See above for goals met   Remaining deficits: None limiting function.    Education / Equipment: HEP, core, Pilates and body mechanics   Plan: Patient agrees to discharge.  Patient goals were met. Patient is being discharged due to being pleased with the  current functional level.  ?????    Patient called to cancel appts, she reports no pain and happy with her core HEP.    Raeford Razor, PT 06/29/2016 11:30 AM Phone: [phone] Fax: [phone]

## 2016-06-29 ENCOUNTER — Ambulatory Visit: Payer: 59 | Admitting: Physical Therapy

## 2016-07-02 ENCOUNTER — Telehealth: Payer: Self-pay | Admitting: Oncology

## 2016-07-02 NOTE — Telephone Encounter (Signed)
Mailed pt records to [facility]

## 2016-07-06 ENCOUNTER — Encounter: Payer: 59 | Admitting: Physical Therapy

## 2016-07-14 ENCOUNTER — Encounter: Payer: 59 | Admitting: Physical Therapy

## 2016-07-29 ENCOUNTER — Other Ambulatory Visit: Payer: Self-pay | Admitting: Family Medicine

## 2016-07-29 DIAGNOSIS — M545 Low back pain: Secondary | ICD-10-CM

## 2016-07-30 ENCOUNTER — Other Ambulatory Visit: Payer: Self-pay

## 2016-07-30 DIAGNOSIS — C50412 Malignant neoplasm of upper-outer quadrant of left female breast: Secondary | ICD-10-CM

## 2016-08-02 ENCOUNTER — Other Ambulatory Visit (HOSPITAL_BASED_OUTPATIENT_CLINIC_OR_DEPARTMENT_OTHER): Payer: 59

## 2016-08-02 DIAGNOSIS — C50412 Malignant neoplasm of upper-outer quadrant of left female breast: Secondary | ICD-10-CM

## 2016-08-02 DIAGNOSIS — C50812 Malignant neoplasm of overlapping sites of left female breast: Secondary | ICD-10-CM

## 2016-08-02 LAB — CBC WITH DIFFERENTIAL/PLATELET
BASO%: 0.3 % (ref 0.0–2.0)
BASOS ABS: 0 10*3/uL (ref 0.0–0.1)
EOS%: 1.2 % (ref 0.0–7.0)
Eosinophils Absolute: 0 10*3/uL (ref 0.0–0.5)
HCT: 37.8 % (ref 34.8–46.6)
HGB: 12.5 g/dL (ref 11.6–15.9)
LYMPH%: 36.7 % (ref 14.0–49.7)
MCH: 29.7 pg (ref 25.1–34.0)
MCHC: 33.1 g/dL (ref 31.5–36.0)
MCV: 89.8 fL (ref 79.5–101.0)
MONO#: 0.1 10*3/uL (ref 0.1–0.9)
MONO%: 4.3 % (ref 0.0–14.0)
NEUT#: 1.9 10*3/uL (ref 1.5–6.5)
NEUT%: 57.5 % (ref 38.4–76.8)
Platelets: 182 10*3/uL (ref 145–400)
RBC: 4.21 10*6/uL (ref 3.70–5.45)
RDW: 13 % (ref 11.2–14.5)
WBC: 3.2 10*3/uL — ABNORMAL LOW (ref 3.9–10.3)
lymph#: 1.2 10*3/uL (ref 0.9–3.3)

## 2016-08-02 LAB — COMPREHENSIVE METABOLIC PANEL
ALT: 16 U/L (ref 0–55)
AST: 24 U/L (ref 5–34)
Albumin: 3.8 g/dL (ref 3.5–5.0)
Alkaline Phosphatase: 56 U/L (ref 40–150)
Anion Gap: 7 mEq/L (ref 3–11)
BUN: 11.3 mg/dL (ref 7.0–26.0)
CALCIUM: 9.2 mg/dL (ref 8.4–10.4)
CHLORIDE: 107 meq/L (ref 98–109)
CO2: 28 meq/L (ref 22–29)
Creatinine: 0.8 mg/dL (ref 0.6–1.1)
EGFR: >90 mL/min/{1.73_m2} (ref >90)
GLUCOSE: 99 mg/dL (ref 70–140)
POTASSIUM: 4.1 meq/L (ref 3.5–5.1)
SODIUM: 142 meq/L (ref 136–145)
Total Bilirubin: 0.52 mg/dL (ref 0.20–1.20)
Total Protein: 6.5 g/dL (ref 6.4–8.3)

## 2016-08-08 NOTE — Progress Notes (Signed)
Nancy Austin  [phone] [phone]     ID: Nancy Austin DOB: [birth year]  MR#: [id number]  HEN#:[id number]  Patient Care Team: Nancy Compton, MD as PCP - General (Obstetrics and Gynecology) Nancy Bookbinder, MD as Consulting Physician (General Surgery) Nancy Pray, MD as Consulting Physician (Radiation Oncology) Nancy Cruel, MD as Consulting Physician (Oncology) Nancy Cheese, NP as Nurse Practitioner (Hematology and Oncology) OTHER MD:  CHIEF COMPLAINT: Triple positive breast cancer  CURRENT TREATMENT:  tamoxifen   BREAST CANCER HISTORY: From the original Intake note:  Nancy Austin herself found a lump in her left breast early October 2015 and brought it to her gynecologist attention. On 09/20/2014 she was set up for bilateral diagnostic mammography and left breast ultrasonography of the breast Center. This was the patient's first ever mammogram. In the area of concern in the left breast there was suspicious pleomorphic calcifications spanning 1.1 cm. There was no discrete mammographic mass in this dense breasts (category C.). On physical exam, there was a palpable firm at 1.5 cm mass at the 3:00 position in the left breast. By ultrasound this was irregular and hypoechoic and measured 1.8 cm. Ultrasound of the left axilla was unremarkable. Aside from multiple cysts in the left breast there were no other findings of concern.  On the same day, 09/20/2014, the patient underwent biopsy of the left breast palpable mass. This showed ([id number] [phone]) and invasive ductal carcinoma, grade 2, estrogen receptor 100% positive, progesterone receptor 89% positive, both with strong staining intensity, with an MIB-1 of 16%. HER-2 was amplified with a signals ratio of 5.07 and a copy number per cell of 6.85. Paragraph on 09/30/2014 the patient underwent bilateral breast MRIs. This showed a 1.7 cm mass in the upper outer quadrant of the left breast. There was  no other suspicious finding in either breast and no abnormal appearing adenopathy.  The patient's subsequent history is as detailed below.  INTERVAL HISTORY: Nancy Austin teturns today for follow-up of her triple positive breast cancer accompanied by her mother. Since her last visit with me she had her ovaries and fallopian tubes removed. We stopped the goserelin. She did not notice much of a change in terms of menopausal symptoms. She has some hot flashes which are not a major issue. She has not gained weight. She does have irregular sleep but takes melatonin for that and also gabapentin. That helps with hot flashes at night. Vaginal dryness is not a concern.  REVIEW OF SYSTEMS: She exercises by walking and running. A detailed review of systems today was entirely negative except as noted above.  PAST MEDICAL HISTORY: Past Medical History:  Diagnosis Date  . Benign skin lesion of forehead 10/2015  . History of breast cancer 09/2014   left  . History of chemotherapy    finished 02/10/2015  . History of radiation therapy 04/16/2015 - 06/02/2015    PAST SURGICAL HISTORY: Past Surgical History:  Procedure Laterality Date  . LAPAROSCOPIC SALPINGO OOPHERECTOMY Bilateral 11/12/2015   Procedure: LAPAROSCOPIC BILATERAL SALPINGO OOPHORECTOMY;  Surgeon: Nancy Compton, MD;  Location: [place] [facility];  Service: Gynecology;  Laterality: Bilateral;  . LESION REMOVAL Left 10/22/2015   Procedure: EXCISION OF FOREHEAD LESION 1X1CM;  Surgeon: Nancy Bookbinder, MD;  Location: [place];  Service: General;  Laterality: Left;  . PORT-A-CATH REMOVAL N/A 10/22/2015   Procedure: REMOVAL PORT-A-CATH;  Surgeon: Nancy Bookbinder, MD;  Location: [place];  Service: General;  Laterality: N/A;  . PORTACATH PLACEMENT N/A 10/15/2014  Procedure: INSERTION PORT-A-CATH;  Surgeon: Nancy Bookbinder, MD;  [facility];  Service: General;  Laterality: N/A;  . RADIOACTIVE SEED GUIDED MASTECTOMY WITH  AXILLARY SENTINEL LYMPH NODE BIOPSY Left 03/06/2015   Procedure: RADIOACTIVE SEED GUIDED PARTIAL MASTECTOMY WITH AXILLARY SENTINEL LYMPH NODE BIOPSY;  Surgeon: Nancy Bookbinder, MD;  Location: [place];  Service: General;  Laterality: Left;  . TONSILLECTOMY  1996    FAMILY HISTORY Family History  Problem Relation Age of Onset  . Skin cancer Father   . Prostate cancer Maternal Uncle 17    currently 55  . Multiple myeloma Paternal Grandmother 73    Deceased 98  The patient's parents are both living. The patient has one brother, no sisters. One grandmother was diagnosed with multiple myeloma at age 90. There is no history of breast or ovarian cancer in the family.   GYNECOLOGIC HISTORY:  No LMP recorded. Patient is not currently having periods (Reason: Other).  menarche age 32, first live birth age 39. The patient is GX P2. She was still having regular periods at the start of chemotherapy. She was on birth control pills on and off for the last 15 years, stopping in October of 2015.   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 Her husband Nancy Austin works as a Immunologist at [employer]. Their children are Nancy Austin age 55 and Nancy Austin age 34.    ADVANCED DIRECTIVES: In place   HEALTH MAINTENANCE: Social History  Substance Use Topics  . Smoking status: Never Smoker  . Smokeless tobacco: Never Used  . Alcohol use Yes     Comment: 3 x/week     Colonoscopy:  PAP:  November 2014  Bone density:  Lipid panel:  Allergies  Allergen Reactions  . Morphine And Related Nausea And Vomiting  . Tegaderm Ag Mesh [Silver] Rash    Current Outpatient Prescriptions  Medication Sig Dispense Refill  . diphenhydrAMINE (BENADRYL) 25 MG tablet Take 25 mg by mouth at bedtime as needed for sleep. Reported on 06/07/2016    . gabapentin (NEURONTIN) 300 MG capsule Take 1 capsule by mouth at  bedtime 90 capsule 4  . meloxicam (MOBIC) 15 MG tablet TAKE 1 TABLET BY MOUTH EVERY  DAY 30 tablet 1  . Multiple Vitamin (MULTIVITAMIN) tablet Take 1 tablet by mouth daily.    . tamoxifen (NOLVADEX) 20 MG tablet Take 1 tablet by mouth  daily 90 tablet 4   No current facility-administered medications for this visit.     OBJECTIVE: young white woman Who appears stated age 40:   08/09/16 1033  BP: 125/85  Pulse: (!) 56  Resp: 18  Temp: 98.9 F (37.2 C)     Body mass index is 22.1 kg/m.    ECOG FS:0 - Asymptomatic   Filed Weights   08/09/16 1033  Weight: 136 lb 14.4 oz (62.1 kg)   Sclerae unicteric, pupils round and equal Oropharynx clear and moist-- no thrush or other lesions No cervical or supraclavicular adenopathy Lungs no rales or rhonchi Heart regular rate and rhythm Abd soft, nontender, positive bowel sounds MSK no focal spinal tenderness, no upper extremity lymphedema Neuro: nonfocal, well oriented, appropriate affect Breasts: Right breast is unremarkable. The left breast is status post lumpectomy, followed by radiation. Left axilla is benign.   LAB RESULTS:  CMP     Component Value Date/Time   NA 142 08/02/2016 1028   K 4.1 08/02/2016 1028   CL 104 10/02/2014 1206  CO2 28 08/02/2016 1028   GLUCOSE 99 08/02/2016 1028   BUN 11.3 08/02/2016 1028   CREATININE 0.8 08/02/2016 1028   CALCIUM 9.2 08/02/2016 1028   PROT 6.5 08/02/2016 1028   ALBUMIN 3.8 08/02/2016 1028   AST 24 08/02/2016 1028   ALT 16 08/02/2016 1028   ALKPHOS 56 08/02/2016 1028   BILITOT 0.52 08/02/2016 1028    I No results found for: SPEP  Lab Results  Component Value Date   WBC 3.2 (L) 08/02/2016   NEUTROABS 1.9 08/02/2016   HGB 12.5 08/02/2016   HCT 37.8 08/02/2016   MCV 89.8 08/02/2016   PLT 182 08/02/2016      Chemistry      Component Value Date/Time   NA 142 08/02/2016 1028   K 4.1 08/02/2016 1028   CL 104 10/02/2014 1206   CO2 28 08/02/2016 1028   BUN 11.3 08/02/2016 1028   CREATININE 0.8 08/02/2016 1028      Component Value Date/Time   CALCIUM  9.2 08/02/2016 1028   ALKPHOS 56 08/02/2016 1028   AST 24 08/02/2016 1028   ALT 16 08/02/2016 1028   BILITOT 0.52 08/02/2016 1028       No results found for: LABCA2  No components found for: LABCA125  No results for input(s): INR in the last 168 hours.  Urinalysis No results found for: COLORURINE  STUDIES: No results found.   ASSESSMENT: 40 y.o. BRCA negative [place] woman s/p Left breast upper outer quadrant biopsy 09/20/2014, for a clinical T1c N0, stage IA invasive ductal carcinoma, grade 2, estrogen and progesterone receptor positive, HER-2 amplified with a signal is ratio of 5.07, and an MIB-1 of 16%  (1) neoadjuvant treatment consisting of carboplatin, docetaxel, trastuzumab and pertuzumab given every 21 days x6,  completed 02/10/2015  (a) breast MRI 02/11/2015 showed a complete radiologic response  (2) left lumpectomy and sentinel lymph node sampling 03/06/2015 showed a residual ypT1c ypN1a, stage IIA invasive ductal carcinoma, grade 1, with repeat prognostic panel still triple positive, and negative margins  (3) trastuzumab continued to total one year (last dose 10/01/2015)  (a) final echo 08/07/2015 finds an ejection fraction of 55-60%.  (4) adjuvant radiation 04/16/2015-06/02/2015 Left breast 50.4 gray in 28 fractions, axillary/supraclavicular region 45 gray in 25 fractions, lumpectomy boost 10 gray in 5 fractions  (5) started tamoxifen 06/26/2015  (a) goserelin started 04/03/2015, last dose 10/06/2015  (b) laparoscopic bilateral salpingo-oophorectomy 11/12/2015 with benign pathology  (6)  the BreastNext geneprofile  ([place] [facility]) obtained November 2015 did not reveal a mutation in ATM, BARD1, BRCA1, BRCA BRIP1, CDH1, CHEK2, MRE11A, MUTYH, NBN, NF1, PALB2, PTEN, RAD50, RAD51C, RAD51D, or TP53  PLAN: Akemi Is tolerating the tamoxifen well and the plan is to continue that most likely for 10 years.  We reviewed her situation, which is very favorable, as she was  triple positive. She understands that in the therapy lower risk of recurrence by one third, anti-HER-2 treatment by half of the remainder, and that the anti-estrogens she is taking right now likely will drop it by another 50%.  At this point I'm comfortable seeing her on a once a year basis. She knows to call for any problems that may develop before her next visit.  Nancy Cruel, MD   08/09/2016 8:25 PM

## 2016-08-09 ENCOUNTER — Ambulatory Visit (HOSPITAL_BASED_OUTPATIENT_CLINIC_OR_DEPARTMENT_OTHER): Payer: 59 | Admitting: Oncology

## 2016-08-09 ENCOUNTER — Telehealth: Payer: Self-pay | Admitting: Oncology

## 2016-08-09 VITALS — BP 125/85 | HR 56 | Temp 98.9°F | Resp 18 | Ht 66.0 in | Wt 136.9 lb

## 2016-08-09 DIAGNOSIS — Z170 Estrogen receptor positive status [ER+]: Secondary | ICD-10-CM | POA: Diagnosis not present

## 2016-08-09 DIAGNOSIS — C50412 Malignant neoplasm of upper-outer quadrant of left female breast: Secondary | ICD-10-CM | POA: Diagnosis not present

## 2016-08-09 NOTE — Telephone Encounter (Signed)
appt made and avs printed °

## 2016-08-12 DIAGNOSIS — C50912 Malignant neoplasm of unspecified site of left female breast: Secondary | ICD-10-CM | POA: Diagnosis not present

## 2016-08-12 DIAGNOSIS — M2550 Pain in unspecified joint: Secondary | ICD-10-CM | POA: Diagnosis not present

## 2016-08-12 DIAGNOSIS — Z170 Estrogen receptor positive status [ER+]: Secondary | ICD-10-CM | POA: Diagnosis not present

## 2016-09-23 DIAGNOSIS — Z23 Encounter for immunization: Secondary | ICD-10-CM | POA: Diagnosis not present

## 2016-10-01 ENCOUNTER — Encounter: Payer: Self-pay | Admitting: Emergency Medicine

## 2016-10-13 ENCOUNTER — Telehealth: Payer: Self-pay | Admitting: *Deleted

## 2016-10-13 DIAGNOSIS — C50412 Malignant neoplasm of upper-outer quadrant of left female breast: Secondary | ICD-10-CM

## 2016-10-13 NOTE — Telephone Encounter (Signed)
This RN spoke with pt per her call stating she " found a lump in my breast and do not know the protoccol on what to do "  Nancy Austin states she found a lump in her upper outer right breast " may be in the same area where they found a cyst on my mammo last January."  Discussed per above dedicated right mammo and U/S will be ordered.  Nancy Austin verbalized understanding per above.

## 2016-10-14 ENCOUNTER — Ambulatory Visit: Payer: 59 | Admitting: Family Medicine

## 2016-10-15 ENCOUNTER — Other Ambulatory Visit: Payer: Self-pay | Admitting: *Deleted

## 2016-10-15 DIAGNOSIS — C50412 Malignant neoplasm of upper-outer quadrant of left female breast: Secondary | ICD-10-CM

## 2016-10-19 ENCOUNTER — Ambulatory Visit
Admission: RE | Admit: 2016-10-19 | Discharge: 2016-10-19 | Disposition: A | Discharge status: Home / Self Care | Payer: 59 | Source: Ambulatory Visit | Attending: Oncology | Admitting: Oncology

## 2016-10-19 ENCOUNTER — Other Ambulatory Visit: Payer: Self-pay | Admitting: Oncology

## 2016-10-19 DIAGNOSIS — C50412 Malignant neoplasm of upper-outer quadrant of left female breast: Secondary | ICD-10-CM

## 2016-10-19 DIAGNOSIS — N6001 Solitary cyst of right breast: Secondary | ICD-10-CM

## 2016-10-19 DIAGNOSIS — N6311 Unspecified lump in the right breast, upper outer quadrant: Secondary | ICD-10-CM | POA: Diagnosis not present

## 2016-10-26 ENCOUNTER — Ambulatory Visit
Admission: RE | Admit: 2016-10-26 | Discharge: 2016-10-26 | Disposition: A | Discharge status: Home / Self Care | Payer: 59 | Source: Ambulatory Visit | Attending: Oncology | Admitting: Oncology

## 2016-10-26 DIAGNOSIS — N6001 Solitary cyst of right breast: Secondary | ICD-10-CM | POA: Diagnosis not present

## 2016-11-08 DIAGNOSIS — M2550 Pain in unspecified joint: Secondary | ICD-10-CM | POA: Diagnosis not present

## 2016-11-08 DIAGNOSIS — C50912 Malignant neoplasm of unspecified site of left female breast: Secondary | ICD-10-CM | POA: Diagnosis not present

## 2016-11-08 DIAGNOSIS — Z170 Estrogen receptor positive status [ER+]: Secondary | ICD-10-CM | POA: Diagnosis not present

## 2016-11-08 DIAGNOSIS — Z8261 Family history of arthritis: Secondary | ICD-10-CM | POA: Diagnosis not present

## 2016-12-10 ENCOUNTER — Other Ambulatory Visit: Payer: Self-pay | Admitting: Nurse Practitioner

## 2016-12-16 ENCOUNTER — Other Ambulatory Visit: Payer: Self-pay | Admitting: Oncology

## 2016-12-16 DIAGNOSIS — Z853 Personal history of malignant neoplasm of breast: Secondary | ICD-10-CM

## 2016-12-28 DIAGNOSIS — R5383 Other fatigue: Secondary | ICD-10-CM | POA: Diagnosis not present

## 2016-12-28 LAB — HM MAMMOGRAPHY: HM Mammogram: NORMAL (ref 0–4)

## 2017-01-03 ENCOUNTER — Ambulatory Visit
Admission: RE | Admit: 2017-01-03 | Discharge: 2017-01-03 | Disposition: A | Discharge status: Home / Self Care | Payer: 59 | Source: Ambulatory Visit | Attending: Oncology | Admitting: Oncology

## 2017-01-03 DIAGNOSIS — R922 Inconclusive mammogram: Secondary | ICD-10-CM | POA: Diagnosis not present

## 2017-01-03 DIAGNOSIS — Z853 Personal history of malignant neoplasm of breast: Secondary | ICD-10-CM

## 2017-01-25 ENCOUNTER — Telehealth: Payer: Self-pay

## 2017-01-25 MED ORDER — GABAPENTIN 300 MG PO CAPS
300.0000 mg | ORAL_CAPSULE | Freq: Every day | ORAL | 3 refills | Status: DC
Start: 2017-01-25 — End: 2018-01-28

## 2017-01-25 MED ORDER — GABAPENTIN 300 MG PO CAPS
300.0000 mg | ORAL_CAPSULE | Freq: Every day | ORAL | 0 refills | Status: DC
Start: 2017-01-25 — End: 2017-01-25

## 2017-01-25 MED ORDER — TAMOXIFEN CITRATE 20 MG PO TABS: 20.0000 mg | ORAL_TABLET | Freq: Every day | ORAL | 0 refills | Status: DC

## 2017-01-25 MED ORDER — TAMOXIFEN CITRATE 20 MG PO TABS: 20.0000 mg | ORAL_TABLET | Freq: Every day | ORAL | 3 refills | Status: DC

## 2017-01-25 NOTE — Telephone Encounter (Signed)
Pt called saying her insurance company is requiring a new mail order pharm so needs 2 week supply to local pharm while the mail order is being filled. [employer] direct fax# [phone]. This was done.

## 2017-02-01 DIAGNOSIS — C50412 Malignant neoplasm of upper-outer quadrant of left female breast: Secondary | ICD-10-CM | POA: Diagnosis not present

## 2017-03-28 ENCOUNTER — Encounter: Payer: Self-pay | Admitting: Family Medicine

## 2017-03-28 ENCOUNTER — Ambulatory Visit (INDEPENDENT_AMBULATORY_CARE_PROVIDER_SITE_OTHER): Payer: 59 | Admitting: Family Medicine

## 2017-03-28 VITALS — BP 110/62 | HR 56 | Temp 98.2°F | Resp 16 | Ht 66.0 in | Wt 137.4 lb

## 2017-03-28 DIAGNOSIS — Z0000 Encounter for general adult medical examination without abnormal findings: Secondary | ICD-10-CM

## 2017-03-28 DIAGNOSIS — Z23 Encounter for immunization: Secondary | ICD-10-CM

## 2017-03-28 LAB — CBC WITH DIFFERENTIAL/PLATELET
BASOS PCT: 1 % (ref 0.0–3.0)
Basophils Absolute: 0 10*3/uL (ref 0.0–0.1)
Eosinophils Absolute: 0.1 10*3/uL (ref 0.0–0.7)
Eosinophils Relative: 2.1 % (ref 0.0–5.0)
HEMATOCRIT: 40.8 % (ref 36.0–46.0)
HEMOGLOBIN: 13.5 g/dL (ref 12.0–15.0)
Lymphocytes Relative: 45.3 % (ref 12.0–46.0)
Lymphs Abs: 1.3 10*3/uL (ref 0.7–4.0)
MCHC: 33.2 g/dL (ref 30.0–36.0)
MCV: 90.1 fl (ref 78.0–100.0)
MONOS PCT: 5.8 % (ref 3.0–12.0)
Monocytes Absolute: 0.2 10*3/uL (ref 0.1–1.0)
Neutro Abs: 1.4 10*3/uL (ref 1.4–7.7)
Neutrophils Relative %: 45.8 % (ref 43.0–77.0)
PLATELETS: 213 10*3/uL (ref 150.0–400.0)
RBC: 4.53 Mil/uL (ref 3.87–5.11)
RDW: 13.1 % (ref 11.5–15.5)
WBC: 3 10*3/uL — AB (ref 4.0–10.5)

## 2017-03-28 LAB — LIPID PANEL
CHOL/HDL RATIO: 3
Cholesterol: 136 mg/dL (ref 0–200)
HDL: 52.7 mg/dL (ref >39.00)
LDL CALC: 65 mg/dL (ref 0–99)
NONHDL: 83.58
Triglycerides: 93 mg/dL (ref 0.0–149.0)
VLDL: 18.6 mg/dL (ref 0.0–40.0)

## 2017-03-28 LAB — HEPATIC FUNCTION PANEL
ALK PHOS: 40 U/L (ref 39–117)
ALT: 14 U/L (ref 0–35)
AST: 20 U/L (ref 0–37)
Albumin: 4.5 g/dL (ref 3.5–5.2)
BILIRUBIN DIRECT: 0.1 mg/dL (ref 0.0–0.3)
BILIRUBIN TOTAL: 0.6 mg/dL (ref 0.2–1.2)
Total Protein: 6.7 g/dL (ref 6.0–8.3)

## 2017-03-28 LAB — BASIC METABOLIC PANEL
BUN: 11 mg/dL (ref 6–23)
CALCIUM: 9.5 mg/dL (ref 8.4–10.5)
CO2: 30 mEq/L (ref 19–32)
CREATININE: 0.74 mg/dL (ref 0.40–1.20)
Chloride: 105 mEq/L (ref 96–112)
GFR: 91.82 mL/min (ref >60.00)
Glucose, Bld: 89 mg/dL (ref 70–99)
Potassium: 4 mEq/L (ref 3.5–5.1)
SODIUM: 141 meq/L (ref 135–145)

## 2017-03-28 LAB — TSH: TSH: 2.52 u[IU]/mL (ref 0.35–4.50)

## 2017-03-28 LAB — VITAMIN D 25 HYDROXY (VIT D DEFICIENCY, FRACTURES): VITD: 27.55 ng/mL — ABNORMAL LOW (ref 30.00–100.00)

## 2017-03-28 NOTE — Patient Instructions (Signed)
Follow up in 1 year or as needed We'll notify you of your lab results and make any changes if needed Continue to work on healthy diet and regular exercise- you look great! Call with any questions or concerns Welcome!  We're glad to have you!  

## 2017-03-28 NOTE — Progress Notes (Signed)
Pre visit review using our clinic review tool, if applicable. No additional management support is needed unless otherwise documented below in the visit note. 

## 2017-03-28 NOTE — Assessment & Plan Note (Signed)
Pt's PE WNL.  UTD on GYN.  Tdap given today.  Check labs.  Anticipatory guidance provided.  

## 2017-03-28 NOTE — Addendum Note (Signed)
Addended by: Davis Gourd on: 03/28/2017 10:52 AM   Modules accepted: Orders

## 2017-03-28 NOTE — Progress Notes (Signed)
   Subjective:    Patient ID: Nancy Austin, female    DOB: [birth year], 40 y.o.   MRN: [id number]  HPI  New to establish.  Previous MD- none recently  [place]- no concerns today.  UTD on GYN.  Due for Tdap.   Review of Systems Patient reports no vision/ hearing changes, adenopathy,fever, weight change,  persistant/recurrent hoarseness , swallowing issues, chest pain, palpitations, edema, persistant/recurrent cough, hemoptysis, dyspnea (rest/exertional/paroxysmal nocturnal), gastrointestinal bleeding (melena, rectal bleeding), abdominal pain, significant heartburn, bowel changes, GU symptoms (dysuria, hematuria, incontinence), Gyn symptoms (abnormal  bleeding, pain),  syncope, focal weakness, memory loss, numbness & tingling, skin/hair/nail changes, abnormal bruising or bleeding, anxiety, or depression.     Objective:   Physical Exam General Appearance:    Alert, cooperative, no distress, appears stated age  Head:    Normocephalic, without obvious abnormality, atraumatic  Eyes:    PERRL, conjunctiva/corneas clear, EOM's intact, fundi    benign, both eyes  Ears:    Normal TM's and external ear canals, both ears  Nose:   Nares normal, septum midline, mucosa normal, no drainage    or sinus tenderness  Throat:   Lips, mucosa, and tongue normal; teeth and gums normal  Neck:   Supple, symmetrical, trachea midline, no adenopathy;    Thyroid: no enlargement/tenderness/nodules  Back:     Symmetric, no curvature, ROM normal, no CVA tenderness  Lungs:     Clear to auscultation bilaterally, respirations unlabored  Chest Wall:    No tenderness or deformity   Heart:    Regular rate and rhythm, S1 and S2 normal, no murmur, rub   or gallop  Breast Exam:    Deferred to GYN  Abdomen:     Soft, non-tender, bowel sounds active all four quadrants,    no masses, no organomegaly  Genitalia:    Deferred to GYN  Rectal:    Extremities:   Extremities normal, atraumatic, no cyanosis  or edema  Pulses:   2+ and symmetric all extremities  Skin:   Skin color, texture, turgor normal, no rashes or lesions  Lymph nodes:   Cervical, supraclavicular, and axillary nodes normal  Neurologic:   CNII-XII intact, normal strength, sensation and reflexes    throughout          Assessment & Plan:

## 2017-05-10 DIAGNOSIS — M2550 Pain in unspecified joint: Secondary | ICD-10-CM | POA: Diagnosis not present

## 2017-05-10 DIAGNOSIS — M79641 Pain in right hand: Secondary | ICD-10-CM | POA: Diagnosis not present

## 2017-05-10 DIAGNOSIS — Z8261 Family history of arthritis: Secondary | ICD-10-CM | POA: Diagnosis not present

## 2017-05-10 DIAGNOSIS — Z6822 Body mass index (BMI) 22.0-22.9, adult: Secondary | ICD-10-CM | POA: Diagnosis not present

## 2017-05-10 DIAGNOSIS — L601 Onycholysis: Secondary | ICD-10-CM | POA: Diagnosis not present

## 2017-05-10 DIAGNOSIS — Z170 Estrogen receptor positive status [ER+]: Secondary | ICD-10-CM | POA: Diagnosis not present

## 2017-05-10 DIAGNOSIS — M79642 Pain in left hand: Secondary | ICD-10-CM | POA: Diagnosis not present

## 2017-05-10 DIAGNOSIS — C50912 Malignant neoplasm of unspecified site of left female breast: Secondary | ICD-10-CM | POA: Diagnosis not present

## 2017-06-18 ENCOUNTER — Other Ambulatory Visit: Payer: Self-pay | Admitting: Sports Medicine

## 2017-06-18 DIAGNOSIS — M545 Low back pain, unspecified: Secondary | ICD-10-CM

## 2017-07-01 DIAGNOSIS — Z01419 Encounter for gynecological examination (general) (routine) without abnormal findings: Secondary | ICD-10-CM | POA: Diagnosis not present

## 2017-07-01 DIAGNOSIS — N6342 Unspecified lump in left breast, subareolar: Secondary | ICD-10-CM | POA: Diagnosis not present

## 2017-07-01 DIAGNOSIS — Z130 Encounter for screening for diseases of the blood and blood-forming organs and certain disorders involving the immune mechanism: Secondary | ICD-10-CM | POA: Diagnosis not present

## 2017-07-01 DIAGNOSIS — Z1389 Encounter for screening for other disorder: Secondary | ICD-10-CM | POA: Diagnosis not present

## 2017-07-01 DIAGNOSIS — Z6822 Body mass index (BMI) 22.0-22.9, adult: Secondary | ICD-10-CM | POA: Diagnosis not present

## 2017-07-04 ENCOUNTER — Other Ambulatory Visit: Payer: Self-pay | Admitting: Obstetrics and Gynecology

## 2017-07-04 DIAGNOSIS — N63 Unspecified lump in unspecified breast: Secondary | ICD-10-CM

## 2017-07-07 ENCOUNTER — Ambulatory Visit
Admission: RE | Admit: 2017-07-07 | Discharge: 2017-07-07 | Disposition: A | Discharge status: Home / Self Care | Payer: 59 | Source: Ambulatory Visit | Attending: Obstetrics and Gynecology | Admitting: Obstetrics and Gynecology

## 2017-07-07 ENCOUNTER — Other Ambulatory Visit: Payer: Self-pay | Admitting: Obstetrics and Gynecology

## 2017-07-07 DIAGNOSIS — N6321 Unspecified lump in the left breast, upper outer quadrant: Secondary | ICD-10-CM | POA: Diagnosis not present

## 2017-07-07 DIAGNOSIS — N6322 Unspecified lump in the left breast, upper inner quadrant: Secondary | ICD-10-CM | POA: Diagnosis not present

## 2017-07-07 DIAGNOSIS — R921 Mammographic calcification found on diagnostic imaging of breast: Secondary | ICD-10-CM

## 2017-07-07 DIAGNOSIS — N63 Unspecified lump in unspecified breast: Secondary | ICD-10-CM

## 2017-07-07 DIAGNOSIS — R922 Inconclusive mammogram: Secondary | ICD-10-CM | POA: Diagnosis not present

## 2017-07-07 HISTORY — DX: Personal history of irradiation: Z92.3

## 2017-07-07 HISTORY — DX: Malignant (primary) neoplasm, unspecified: C80.1

## 2017-07-07 HISTORY — DX: Malignant neoplasm of unspecified site of unspecified female breast: C50.919

## 2017-07-13 ENCOUNTER — Ambulatory Visit
Admission: RE | Admit: 2017-07-13 | Discharge: 2017-07-13 | Disposition: A | Discharge status: Home / Self Care | Payer: 59 | Source: Ambulatory Visit | Attending: Obstetrics and Gynecology | Admitting: Obstetrics and Gynecology

## 2017-07-13 ENCOUNTER — Other Ambulatory Visit: Payer: Self-pay | Admitting: Obstetrics and Gynecology

## 2017-07-13 DIAGNOSIS — R921 Mammographic calcification found on diagnostic imaging of breast: Secondary | ICD-10-CM

## 2017-07-19 DIAGNOSIS — C50412 Malignant neoplasm of upper-outer quadrant of left female breast: Secondary | ICD-10-CM | POA: Diagnosis not present

## 2017-07-19 DIAGNOSIS — R921 Mammographic calcification found on diagnostic imaging of breast: Secondary | ICD-10-CM | POA: Diagnosis not present

## 2017-07-22 ENCOUNTER — Other Ambulatory Visit: Payer: Self-pay | Admitting: General Surgery

## 2017-07-22 DIAGNOSIS — R921 Mammographic calcification found on diagnostic imaging of breast: Secondary | ICD-10-CM

## 2017-07-27 ENCOUNTER — Other Ambulatory Visit: Payer: Self-pay | Admitting: General Surgery

## 2017-07-27 DIAGNOSIS — R921 Mammographic calcification found on diagnostic imaging of breast: Secondary | ICD-10-CM

## 2017-08-02 ENCOUNTER — Other Ambulatory Visit: Payer: 59

## 2017-08-02 ENCOUNTER — Other Ambulatory Visit (HOSPITAL_BASED_OUTPATIENT_CLINIC_OR_DEPARTMENT_OTHER): Payer: 59

## 2017-08-02 DIAGNOSIS — C50412 Malignant neoplasm of upper-outer quadrant of left female breast: Secondary | ICD-10-CM

## 2017-08-02 LAB — CBC WITH DIFFERENTIAL/PLATELET
BASO%: 0.4 % (ref 0.0–2.0)
Basophils Absolute: 0 10*3/uL (ref 0.0–0.1)
EOS%: 0.8 % (ref 0.0–7.0)
Eosinophils Absolute: 0 10*3/uL (ref 0.0–0.5)
HEMATOCRIT: 38.9 % (ref 34.8–46.6)
HGB: 12.8 g/dL (ref 11.6–15.9)
LYMPH%: 34.8 % (ref 14.0–49.7)
MCH: 29.3 pg (ref 25.1–34.0)
MCHC: 33 g/dL (ref 31.5–36.0)
MCV: 88.6 fL (ref 79.5–101.0)
MONO#: 0.3 10*3/uL (ref 0.1–0.9)
MONO%: 7.6 % (ref 0.0–14.0)
NEUT#: 1.9 10*3/uL (ref 1.5–6.5)
NEUT%: 56.4 % (ref 38.4–76.8)
PLATELETS: 186 10*3/uL (ref 145–400)
RBC: 4.39 10*6/uL (ref 3.70–5.45)
RDW: 12.6 % (ref 11.2–14.5)
WBC: 3.4 10*3/uL — ABNORMAL LOW (ref 3.9–10.3)
lymph#: 1.2 10*3/uL (ref 0.9–3.3)

## 2017-08-02 LAB — COMPREHENSIVE METABOLIC PANEL
ALT: 12 U/L (ref 0–55)
ANION GAP: 6 meq/L (ref 3–11)
AST: 22 U/L (ref 5–34)
Albumin: 3.9 g/dL (ref 3.5–5.0)
Alkaline Phosphatase: 51 U/L (ref 40–150)
BUN: 11.4 mg/dL (ref 7.0–26.0)
CALCIUM: 9.8 mg/dL (ref 8.4–10.4)
CHLORIDE: 104 meq/L (ref 98–109)
CO2: 32 mEq/L — ABNORMAL HIGH (ref 22–29)
CREATININE: 0.8 mg/dL (ref 0.6–1.1)
EGFR: 87 mL/min/{1.73_m2} — AB (ref >90)
Glucose: 93 mg/dl (ref 70–140)
POTASSIUM: 4 meq/L (ref 3.5–5.1)
Sodium: 141 mEq/L (ref 136–145)
Total Bilirubin: 0.31 mg/dL (ref 0.20–1.20)
Total Protein: 6.7 g/dL (ref 6.4–8.3)

## 2017-08-09 ENCOUNTER — Ambulatory Visit (HOSPITAL_BASED_OUTPATIENT_CLINIC_OR_DEPARTMENT_OTHER): Payer: 59 | Admitting: Oncology

## 2017-08-09 VITALS — BP 113/70 | HR 56 | Temp 98.3°F | Resp 16 | Ht 66.0 in | Wt 137.1 lb

## 2017-08-09 DIAGNOSIS — Z170 Estrogen receptor positive status [ER+]: Secondary | ICD-10-CM

## 2017-08-09 DIAGNOSIS — C50412 Malignant neoplasm of upper-outer quadrant of left female breast: Secondary | ICD-10-CM

## 2017-08-09 NOTE — Progress Notes (Signed)
[facility]  Telephone:([phone]) [phone] Fax:(336) [phone]     ID: Nancy Austin DOB: 01-01-1976  MR#: [id number]  XKG#:[id number]  Patient Care Team: Midge Minium, MD as PCP - General (Family Medicine) Rolm Bookbinder, MD as Consulting Physician (General Surgery) Gery Pray, MD as Consulting Physician (Radiation Oncology) Dotti Busey, Virgie Dad, MD as Consulting Physician (Oncology) Sylvan Cheese, NP as Nurse Practitioner (Hematology and Oncology) Paula Compton, MD as Consulting Physician (Obstetrics and Gynecology) OTHER MD:  CHIEF COMPLAINT: Triple positive breast cancer  CURRENT TREATMENT:  tamoxifen   BREAST CANCER HISTORY: From the original Intake note:  Nancy Austin herself found a lump in her left breast early October 2015 and brought it to her gynecologist attention. On 09/20/2014 she was set up for bilateral diagnostic mammography and left breast ultrasonography of the breast Center. This was the patient's first ever mammogram. In the area of concern in the left breast there was suspicious pleomorphic calcifications spanning 1.1 cm. There was no discrete mammographic mass in this dense breasts (category C.). On physical exam, there was a palpable firm at 1.5 cm mass at the 3:00 position in the left breast. By ultrasound this was irregular and hypoechoic and measured 1.8 cm. Ultrasound of the left axilla was unremarkable. Aside from multiple cysts in the left breast there were no other findings of concern.  On the same day, 09/20/2014, the patient underwent biopsy of the left breast palpable mass. This showed ([id number]) and invasive ductal carcinoma, grade 2, estrogen receptor 100% positive, progesterone receptor 89% positive, both with strong staining intensity, with an MIB-1 of 16%. HER-2 was amplified with a signals ratio of 5.07 and a copy number per cell of 6.85. Paragraph on 09/30/2014 the patient underwent bilateral breast MRIs. This showed  a 1.7 cm mass in the upper outer quadrant of the left breast. There was no other suspicious finding in either breast and no abnormal appearing adenopathy.  The patient's subsequent history is as detailed below.  INTERVAL HISTORY: Nancy Austin returns today for follow-up and treatment of her triple positive breast cancer. He continues on tamoxifen, generally with good tolerance. She has minimal hot flashes and no problems with vaginal wetness. She obtains a drug at a good price.  Since her last visit here she noted a change in her left breast. She brought this to medical attention and on 07/07/2017 underwent unilateral left diagnostic mammography and ultrasound at the Breast Center. In the area of concern, near the areola, spot compression views showed no changes or calcifications. Ultrasound of that area showed only a ridge of normal dense fibroglandular tissue.   On the other hand there were new pleomorphic calcifications in the lower outer quadrant of the left breast, not far from the prior lumpectomy site. These measured 0.7 cm. She was scheduled for biopsy of this area but because of its location it was difficult and after several failed attempts the patient has been referred to Dr. Donne Hazel for excision. She is scheduled to see him on 08/17/2017.  REVIEW OF SYSTEMS: Nancy Austin did generally feels well. She exercises regularly. She likes to run as well as take walks into strength exercises. A detailed review of systems today was otherwise stable  PAST MEDICAL HISTORY: Past Medical History:  Diagnosis Date  . Benign skin lesion of forehead 10/2015  . Breast cancer ([place])   . Cancer ([place])   . History of breast cancer 09/2014   left  . History of chemotherapy    finished 02/10/2015  .  History of radiation therapy 04/16/2015 - 06/02/2015  . Personal history of radiation therapy     PAST SURGICAL HISTORY: Past Surgical History:  Procedure Laterality Date  . BREAST LUMPECTOMY    . LAPAROSCOPIC  SALPINGO OOPHERECTOMY Bilateral 11/12/2015   Procedure: LAPAROSCOPIC BILATERAL SALPINGO OOPHORECTOMY;  Surgeon: Paula Compton, MD;  Location: [facility];  Service: Gynecology;  Laterality: Bilateral;  . LESION REMOVAL Left 10/22/2015   Procedure: EXCISION OF FOREHEAD LESION 1X1CM;  Surgeon: Rolm Bookbinder, MD;  Location: [facility];  Service: General;  Laterality: Left;  . PORT-A-CATH REMOVAL N/A 10/22/2015   Procedure: REMOVAL PORT-A-CATH;  Surgeon: Rolm Bookbinder, MD;  Location: [facility];  Service: General;  Laterality: N/A;  . PORTACATH PLACEMENT N/A 10/15/2014   Procedure: INSERTION PORT-A-CATH;  Surgeon: Rolm Bookbinder, MD;  Location: [facility];  Service: General;  Laterality: N/A;  . RADIOACTIVE SEED GUIDED MASTECTOMY WITH AXILLARY SENTINEL LYMPH NODE BIOPSY Left 03/06/2015   Procedure: RADIOACTIVE SEED GUIDED PARTIAL MASTECTOMY WITH AXILLARY SENTINEL LYMPH NODE BIOPSY;  Surgeon: Rolm Bookbinder, MD;  Location: [facility];  Service: General;  Laterality: Left;  . TONSILLECTOMY  1996    FAMILY HISTORY Family History  Problem Relation Age of Onset  . Skin cancer Father   . Hyperlipidemia Father   . Prostate cancer Maternal Uncle 74       currently 38  . Multiple myeloma Paternal Grandmother 58       Deceased 56  . Cancer Paternal Grandmother        bone  . Prostate cancer Paternal Uncle   . Lung disease Maternal Grandmother   . AAA (abdominal aortic aneurysm) Maternal Grandmother   . Stroke Maternal Grandfather   The patient's parents are both living. The patient has one brother, no sisters. One grandmother was diagnosed with multiple myeloma at age 63. There is no history of breast or ovarian cancer in the family.   GYNECOLOGIC HISTORY:  No LMP recorded. Patient is not currently having periods (Reason: Other).  menarche age 48, first live birth age 8. The patient is GX P2. She was still having regular periods at the start of  chemotherapy. She was on birth control pills on and off for the last 15 years, stopping in October of 2015.   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 Her husband Nancy Austin works as a Immunologist at [employer]. Their children are aged 47, and 81 as of August 2018    ADVANCED DIRECTIVES: In place   HEALTH MAINTENANCE: Social History  Substance Use Topics  . Smoking status: Never Smoker  . Smokeless tobacco: Never Used  . Alcohol use Yes     Comment: 3 x/week     Colonoscopy:  PAP:  November 2014  Bone density:  Lipid panel:  Allergies  Allergen Reactions  . Morphine And Related Nausea And Vomiting  . Tegaderm Ag Mesh [Silver] Rash    Current Outpatient Prescriptions  Medication Sig Dispense Refill  . diphenhydrAMINE (BENADRYL) 25 MG tablet Take 25 mg by mouth at bedtime as needed for sleep. Reported on 06/07/2016    . gabapentin (NEURONTIN) 300 MG capsule Take 1 capsule (300 mg total) by mouth at bedtime. 90 capsule 3  . meloxicam (MOBIC) 15 MG tablet TAKE 1 TABLET BY MOUTH EVERY DAY 30 tablet 1  . Multiple Vitamin (MULTIVITAMIN) tablet Take 1 tablet by mouth daily.    . tamoxifen (NOLVADEX) 20 MG tablet Take 1  tablet (20 mg total) by mouth daily. 90 tablet 3   No current facility-administered medications for this visit.     OBJECTIVE: young white woman In no acute distress  Vitals:   08/09/17 1214  BP: 113/70  Pulse: (!) 56  Resp: 16  Temp: 98.3 F (36.8 C)  SpO2: 100%     Body mass index is 22.13 kg/m.    ECOG FS:0 - Asymptomatic   Filed Weights   08/09/17 1214  Weight: 137 lb 1.6 oz (62.2 kg)   Sclerae unicteric, EOMs intact Oropharynx clear and moist No cervical or supraclavicular adenopathy Lungs no rales or rhonchi Heart regular rate and rhythm Abd soft, nontender, positive bowel sounds MSK no focal spinal tenderness, no upper extremity lymphedema Neuro: nonfocal, well oriented, appropriate affect Breasts: The right breast  is benign. The left breast is status post lumpectomy. I can see what she was concerned about the thickening around the areola but this does feel unremarkable to me. Both axillae are benign.    LAB RESULTS:  CMP     Component Value Date/Time   NA 141 08/02/2017 1536   K 4.0 08/02/2017 1536   CL 105 03/28/2017 1053   CO2 32 (H) 08/02/2017 1536   GLUCOSE 93 08/02/2017 1536   BUN 11.4 08/02/2017 1536   CREATININE 0.8 08/02/2017 1536   CALCIUM 9.8 08/02/2017 1536   PROT 6.7 08/02/2017 1536   ALBUMIN 3.9 08/02/2017 1536   AST 22 08/02/2017 1536   ALT 12 08/02/2017 1536   ALKPHOS 51 08/02/2017 1536   BILITOT 0.31 08/02/2017 1536    I No results found for: SPEP  Lab Results  Component Value Date   WBC 3.4 (L) 08/02/2017   NEUTROABS 1.9 08/02/2017   HGB 12.8 08/02/2017   HCT 38.9 08/02/2017   MCV 88.6 08/02/2017   PLT 186 08/02/2017      Chemistry      Component Value Date/Time   NA 141 08/02/2017 1536   K 4.0 08/02/2017 1536   CL 105 03/28/2017 1053   CO2 32 (H) 08/02/2017 1536   BUN 11.4 08/02/2017 1536   CREATININE 0.8 08/02/2017 1536      Component Value Date/Time   CALCIUM 9.8 08/02/2017 1536   ALKPHOS 51 08/02/2017 1536   AST 22 08/02/2017 1536   ALT 12 08/02/2017 1536   BILITOT 0.31 08/02/2017 1536       No results found for: LABCA2  No components found for: LABCA125  No results for input(s): INR in the last 168 hours.  Urinalysis No results found for: COLORURINE  STUDIES: Mm Clip Placement Left  Result Date: 07/13/2017 CLINICAL DATA:  The patient presented for stereotactic core needle biopsy of calcifications in the lower outer quadrant of the left breast, which were found suspicious on most recent diagnostic mammogram. She has a history of treated left breast cancer, status post lumpectomy in 2015. EXAM: DIAGNOSTIC LEFT MAMMOGRAM POST STEREOTACTIC BIOPSY COMPARISON:  Previous exam(s). FINDINGS: Stereotactic core needle biopsy of calcifications in  the lower slightly outer left breast, far posterior depth, was attempted. Due to the far posterior location of the calcifications, they were found to be located slightly posterior to the posterior edge of the compression paddle window in which stereotactic core needle biopsy could be performed. An attempt was made to obtain tissue from the anteriormost border of the group of calcifications, however it was unsuccessful, despite several attempts with optimal technique. A coil shaped tissue marker was placed at the site  of the attempted stereotactic biopsy. Mammographic images were obtained following the attempted stereotactic guided biopsy of left breast lower slightly outer quadrant calcifications. Spot compression views of the left breast demonstrate presence of coil shaped marker, 1 cm anterior to the group of calcifications, seen at the edge of the field of view. IMPRESSION: Unsuccessful stereotactic core needle biopsy of group of calcifications in the left breast lower outer quadrant, far posterior depth. Given the unsuccessful core needle biopsy attempt despite using optimal technique, excisional biopsy of the group of calcifications is recommended. Placement of coil shaped marker at the attempted stereotactic core needle biopsy site, which is located 1 cm anterior to the group of calcifications which were found suspicious on recent diagnostic mammography. The patient is scheduled to see Dr. Donne Hazel on 07/28/2017 at 10:15 a.m. Final Assessment: Post Procedure Mammograms for Marker Placement Electronically Signed   By: Fidela Salisbury M.D.   On: 07/13/2017 13:33     ASSESSMENT: 41 y.o. BRCA negative [place] woman s/p Left breast upper outer quadrant biopsy 09/20/2014, for a clinical T1c N0, stage IA invasive ductal carcinoma, grade 2, estrogen and progesterone receptor positive, HER-2 amplified with a signal is ratio of 5.07, and an MIB-1 of 16%  (1) neoadjuvant treatment consisting of carboplatin,  docetaxel, trastuzumab and pertuzumab given every 21 days x6,  completed 02/10/2015  (a) breast MRI 02/11/2015 showed a complete radiologic response  (2) left lumpectomy and sentinel lymph node sampling 03/06/2015 showed a residual ypT1c ypN1a, stage IIA invasive ductal carcinoma, grade 1, with repeat prognostic panel still triple positive, and negative margins  (3) trastuzumab continued to total one year (last dose 10/01/2015)  (a) final echo 08/07/2015 finds an ejection fraction of 55-60%.  (4) adjuvant radiation 04/16/2015-06/02/2015 Left breast 50.4 gray in 28 fractions, axillary/supraclavicular region 45 gray in 25 fractions, lumpectomy boost 10 gray in 5 fractions  (5) started tamoxifen 06/26/2015  (a) goserelin started 04/03/2015, last dose 10/06/2015  (b) laparoscopic bilateral salpingo-oophorectomy 11/12/2015 with benign pathology  (6)  the [facility] geneprofile  ([facility] [facility]) obtained November 2015 did not reveal a mutation in ATM, BARD1, BRCA1, BRCA BRIP1, CDH1, CHEK2, MRE11A, MUTYH, NBN, NF1, PALB2, PTEN, RAD50, RAD51C, RAD51D, or TP53  PLAN: Nancy Austin is now a little over 2 years out from definitive surgery for her breast cancer with no evidence of disease recurrence. This is very favorable.  We may be dealing with a small area of ductal carcinoma in situ or I think more likely fat necrosis. Nevertheless this needs to be definitively dated diagnosed and I support the plan for excisional biopsy if further attempts at biopsy are not possible or are unsuccessful.  With the expectation that we will get good news once passes available, I have scheduled her to return to see me in one year. Of course I will be glad to see her before that if we have a new problem to deal with  She knows to call for any other issues that may develop before the next visit here.    Chauncey Cruel, MD   08/09/2017 12:30 PM

## 2017-08-10 ENCOUNTER — Encounter (HOSPITAL_BASED_OUTPATIENT_CLINIC_OR_DEPARTMENT_OTHER): Payer: Self-pay | Admitting: *Deleted

## 2017-08-16 ENCOUNTER — Inpatient Hospital Stay: Admission: RE | Admit: 2017-08-16 | Payer: 59 | Source: Ambulatory Visit

## 2017-08-16 ENCOUNTER — Ambulatory Visit
Admission: RE | Admit: 2017-08-16 | Discharge: 2017-08-16 | Disposition: A | Discharge status: Home / Self Care | Payer: 59 | Source: Ambulatory Visit | Attending: General Surgery | Admitting: General Surgery

## 2017-08-16 DIAGNOSIS — R921 Mammographic calcification found on diagnostic imaging of breast: Secondary | ICD-10-CM

## 2017-08-16 NOTE — Progress Notes (Signed)
Ensure Pre-surgery drink given with instructions to complete by 0500 dos.

## 2017-08-17 ENCOUNTER — Ambulatory Visit
Admission: RE | Admit: 2017-08-17 | Discharge: 2017-08-17 | Disposition: A | Discharge status: Home / Self Care | Payer: 59 | Source: Ambulatory Visit | Attending: General Surgery | Admitting: General Surgery

## 2017-08-17 ENCOUNTER — Encounter (HOSPITAL_BASED_OUTPATIENT_CLINIC_OR_DEPARTMENT_OTHER): Payer: Self-pay | Admitting: Anesthesiology

## 2017-08-17 ENCOUNTER — Encounter (HOSPITAL_BASED_OUTPATIENT_CLINIC_OR_DEPARTMENT_OTHER)
Admission: RE | Disposition: A | Discharge status: Home / Self Care | Payer: Self-pay | Source: Ambulatory Visit | Attending: General Surgery

## 2017-08-17 ENCOUNTER — Ambulatory Visit (HOSPITAL_BASED_OUTPATIENT_CLINIC_OR_DEPARTMENT_OTHER)
Admission: RE | Admit: 2017-08-17 | Discharge: 2017-08-17 | Disposition: A | Discharge status: Home / Self Care | Payer: 59 | Source: Ambulatory Visit | Attending: General Surgery | Admitting: General Surgery

## 2017-08-17 ENCOUNTER — Ambulatory Visit (HOSPITAL_BASED_OUTPATIENT_CLINIC_OR_DEPARTMENT_OTHER): Payer: 59 | Admitting: Anesthesiology

## 2017-08-17 DIAGNOSIS — M545 Low back pain: Secondary | ICD-10-CM | POA: Diagnosis not present

## 2017-08-17 DIAGNOSIS — Z1501 Genetic susceptibility to malignant neoplasm of breast: Secondary | ICD-10-CM | POA: Insufficient documentation

## 2017-08-17 DIAGNOSIS — N6092 Unspecified benign mammary dysplasia of left breast: Secondary | ICD-10-CM | POA: Insufficient documentation

## 2017-08-17 DIAGNOSIS — C50412 Malignant neoplasm of upper-outer quadrant of left female breast: Secondary | ICD-10-CM | POA: Diagnosis not present

## 2017-08-17 DIAGNOSIS — D0512 Intraductal carcinoma in situ of left breast: Secondary | ICD-10-CM | POA: Diagnosis not present

## 2017-08-17 DIAGNOSIS — R921 Mammographic calcification found on diagnostic imaging of breast: Secondary | ICD-10-CM | POA: Diagnosis not present

## 2017-08-17 DIAGNOSIS — K5900 Constipation, unspecified: Secondary | ICD-10-CM | POA: Diagnosis not present

## 2017-08-17 HISTORY — PX: RADIOACTIVE SEED GUIDED EXCISIONAL BREAST BIOPSY: SHX6490

## 2017-08-17 SURGERY — RADIOACTIVE SEED GUIDED BREAST BIOPSY
Anesthesia: General | Site: Breast | Laterality: Left

## 2017-08-17 MED ORDER — ONDANSETRON HCL 4 MG/2ML IJ SOLN
INTRAMUSCULAR | Status: DC | PRN
Start: 2017-08-17 — End: 2017-08-17
  Administered 2017-08-17: 4 mg via INTRAVENOUS

## 2017-08-17 MED ORDER — EPHEDRINE SULFATE 50 MG/ML IJ SOLN
INTRAMUSCULAR | Status: DC | PRN
  Administered 2017-08-17: 10 mg via INTRAVENOUS

## 2017-08-17 MED ORDER — KETOROLAC TROMETHAMINE 30 MG/ML IJ SOLN: 30.0000 mg | Freq: Once | INTRAMUSCULAR | Status: DC | PRN

## 2017-08-17 MED ORDER — DEXAMETHASONE SODIUM PHOSPHATE 4 MG/ML IJ SOLN
INTRAMUSCULAR | Status: DC | PRN
  Administered 2017-08-17: 10 mg via INTRAVENOUS

## 2017-08-17 MED ORDER — LIDOCAINE 2% (20 MG/ML) 5 ML SYRINGE
INTRAMUSCULAR | Status: AC
  Filled 2017-08-17: qty 5

## 2017-08-17 MED ORDER — BUPIVACAINE HCL (PF) 0.5 % IJ SOLN
INTRAMUSCULAR | Status: AC
  Filled 2017-08-17: qty 90

## 2017-08-17 MED ORDER — MIDAZOLAM HCL 2 MG/2ML IJ SOLN
1.0000 mg | INTRAMUSCULAR | Status: DC | PRN
  Administered 2017-08-17: 2 mg via INTRAVENOUS

## 2017-08-17 MED ORDER — PROPOFOL 10 MG/ML IV BOLUS
INTRAVENOUS | Status: DC | PRN
  Administered 2017-08-17: 150 mg via INTRAVENOUS

## 2017-08-17 MED ORDER — LIDOCAINE 2% (20 MG/ML) 5 ML SYRINGE
INTRAMUSCULAR | Status: DC | PRN
  Administered 2017-08-17: 100 mg via INTRAVENOUS

## 2017-08-17 MED ORDER — ONDANSETRON HCL 4 MG/2ML IJ SOLN
INTRAMUSCULAR | Status: AC
  Filled 2017-08-17: qty 2

## 2017-08-17 MED ORDER — FENTANYL CITRATE (PF) 100 MCG/2ML IJ SOLN: 25.0000 ug | INTRAMUSCULAR | Status: DC | PRN

## 2017-08-17 MED ORDER — MEPERIDINE HCL 25 MG/ML IJ SOLN: 6.2500 mg | INTRAMUSCULAR | Status: DC | PRN

## 2017-08-17 MED ORDER — SCOPOLAMINE 1 MG/3DAYS TD PT72
MEDICATED_PATCH | TRANSDERMAL | Status: AC
  Filled 2017-08-17: qty 1

## 2017-08-17 MED ORDER — GLYCOPYRROLATE 0.2 MG/ML IJ SOLN
0.2000 mg | Freq: Once | INTRAMUSCULAR | Status: AC
  Administered 2017-08-17: 0.2 mg via INTRAVENOUS

## 2017-08-17 MED ORDER — CEFAZOLIN SODIUM-DEXTROSE 2-4 GM/100ML-% IV SOLN
2.0000 g | INTRAVENOUS | Status: AC
  Administered 2017-08-17: 2 g via INTRAVENOUS

## 2017-08-17 MED ORDER — PROMETHAZINE HCL 25 MG/ML IJ SOLN: 6.2500 mg | INTRAMUSCULAR | Status: DC | PRN

## 2017-08-17 MED ORDER — CEFAZOLIN SODIUM-DEXTROSE 2-4 GM/100ML-% IV SOLN
INTRAVENOUS | Status: AC
  Filled 2017-08-17: qty 100

## 2017-08-17 MED ORDER — GABAPENTIN 300 MG PO CAPS
300.0000 mg | ORAL_CAPSULE | ORAL | Status: AC
  Administered 2017-08-17: 300 mg via ORAL

## 2017-08-17 MED ORDER — GABAPENTIN 300 MG PO CAPS
ORAL_CAPSULE | ORAL | Status: AC
  Filled 2017-08-17: qty 1

## 2017-08-17 MED ORDER — FENTANYL CITRATE (PF) 100 MCG/2ML IJ SOLN
INTRAMUSCULAR | Status: AC
  Filled 2017-08-17: qty 2

## 2017-08-17 MED ORDER — OXYCODONE HCL 5 MG/5ML PO SOLN: 5.0000 mg | Freq: Once | ORAL | Status: DC | PRN

## 2017-08-17 MED ORDER — CELECOXIB 200 MG PO CAPS
ORAL_CAPSULE | ORAL | Status: AC
  Filled 2017-08-17: qty 1

## 2017-08-17 MED ORDER — SCOPOLAMINE 1 MG/3DAYS TD PT72
1.0000 | MEDICATED_PATCH | Freq: Once | TRANSDERMAL | Status: DC | PRN
  Administered 2017-08-17: 1.5 mg via TRANSDERMAL

## 2017-08-17 MED ORDER — CELECOXIB 200 MG PO CAPS
200.0000 mg | ORAL_CAPSULE | ORAL | Status: AC
  Administered 2017-08-17: 200 mg via ORAL

## 2017-08-17 MED ORDER — MIDAZOLAM HCL 2 MG/2ML IJ SOLN
INTRAMUSCULAR | Status: AC
  Filled 2017-08-17: qty 2

## 2017-08-17 MED ORDER — OXYCODONE HCL 5 MG PO TABS
5.0000 mg | ORAL_TABLET | Freq: Once | ORAL | Status: DC | PRN
Start: 2017-08-17 — End: 2017-08-17

## 2017-08-17 MED ORDER — ACETAMINOPHEN 500 MG PO TABS
ORAL_TABLET | ORAL | Status: AC
  Filled 2017-08-17: qty 2

## 2017-08-17 MED ORDER — BUPIVACAINE HCL (PF) 0.5 % IJ SOLN
INTRAMUSCULAR | Status: DC | PRN
  Administered 2017-08-17: 10 mL

## 2017-08-17 MED ORDER — ACETAMINOPHEN 500 MG PO TABS
1000.0000 mg | ORAL_TABLET | ORAL | Status: AC
  Administered 2017-08-17: 1000 mg via ORAL

## 2017-08-17 MED ORDER — FENTANYL CITRATE (PF) 100 MCG/2ML IJ SOLN
50.0000 ug | INTRAMUSCULAR | Status: DC | PRN
  Administered 2017-08-17: 50 ug via INTRAVENOUS

## 2017-08-17 MED ORDER — DEXAMETHASONE SODIUM PHOSPHATE 10 MG/ML IJ SOLN
INTRAMUSCULAR | Status: AC
  Filled 2017-08-17: qty 1

## 2017-08-17 MED ORDER — LACTATED RINGERS IV SOLN
INTRAVENOUS | Status: DC
  Administered 2017-08-17: 10 mL/h via INTRAVENOUS

## 2017-08-17 SURGICAL SUPPLY — 59 items
APPLIER CLIP 9.375 MED OPEN (MISCELLANEOUS)
BINDER BREAST LRG (GAUZE/BANDAGES/DRESSINGS) IMPLANT
BINDER BREAST MEDIUM (GAUZE/BANDAGES/DRESSINGS) ×3 IMPLANT
BINDER BREAST XLRG (GAUZE/BANDAGES/DRESSINGS) IMPLANT
BINDER BREAST XXLRG (GAUZE/BANDAGES/DRESSINGS) IMPLANT
BLADE SURG 15 STRL LF DISP TIS (BLADE) ×1 IMPLANT
BLADE SURG 15 STRL SS (BLADE) ×2
CANISTER SUC SOCK COL 7IN (MISCELLANEOUS) IMPLANT
CANISTER SUCT 1200ML W/VALVE (MISCELLANEOUS) IMPLANT
CHLORAPREP W/TINT 26ML (MISCELLANEOUS) IMPLANT
CLIP APPLIE 9.375 MED OPEN (MISCELLANEOUS) IMPLANT
CLIP VESOCCLUDE SM WIDE 6/CT (CLIP) IMPLANT
CLOSURE WOUND 1/2 X4 (GAUZE/BANDAGES/DRESSINGS) ×1
COVER BACK TABLE 60X90IN (DRAPES) ×3 IMPLANT
COVER MAYO STAND STRL (DRAPES) ×3 IMPLANT
COVER PROBE W GEL 5X96 (DRAPES) ×3 IMPLANT
DECANTER SPIKE VIAL GLASS SM (MISCELLANEOUS) IMPLANT
DERMABOND ADVANCED (GAUZE/BANDAGES/DRESSINGS) ×2
DERMABOND ADVANCED .7 DNX12 (GAUZE/BANDAGES/DRESSINGS) ×1 IMPLANT
DEVICE DUBIN W/COMP PLATE 8390 (MISCELLANEOUS) ×3 IMPLANT
DRAPE LAPAROSCOPIC ABDOMINAL (DRAPES) ×3 IMPLANT
DRAPE UTILITY XL STRL (DRAPES) ×3 IMPLANT
DRSG TEGADERM 4X4.75 (GAUZE/BANDAGES/DRESSINGS) IMPLANT
ELECT COATED BLADE 2.86 ST (ELECTRODE) ×3 IMPLANT
ELECT REM PT RETURN 9FT ADLT (ELECTROSURGICAL) ×3
ELECTRODE REM PT RTRN 9FT ADLT (ELECTROSURGICAL) ×1 IMPLANT
GAUZE SPONGE 4X4 12PLY STRL LF (GAUZE/BANDAGES/DRESSINGS) IMPLANT
GLOVE BIO SURGEON STRL SZ7 (GLOVE) ×6 IMPLANT
GLOVE BIOGEL PI IND STRL 7.5 (GLOVE) ×1 IMPLANT
GLOVE BIOGEL PI INDICATOR 7.5 (GLOVE) ×2
GLOVE EXAM NITRILE MD LF STRL (GLOVE) ×3 IMPLANT
GLOVE SURG SS PI 7.0 STRL IVOR (GLOVE) ×3 IMPLANT
GOWN STRL REUS W/ TWL LRG LVL3 (GOWN DISPOSABLE) ×2 IMPLANT
GOWN STRL REUS W/TWL LRG LVL3 (GOWN DISPOSABLE) ×4
HEMOSTAT ARISTA ABSORB 3G PWDR (MISCELLANEOUS) IMPLANT
ILLUMINATOR WAVEGUIDE N/F (MISCELLANEOUS) IMPLANT
KIT MARKER MARGIN INK (KITS) ×3 IMPLANT
LIGHT WAVEGUIDE WIDE FLAT (MISCELLANEOUS) IMPLANT
NEEDLE HYPO 25X1 1.5 SAFETY (NEEDLE) ×3 IMPLANT
NS IRRIG 1000ML POUR BTL (IV SOLUTION) IMPLANT
PACK BASIN DAY SURGERY FS (CUSTOM PROCEDURE TRAY) ×3 IMPLANT
PENCIL BUTTON HOLSTER BLD 10FT (ELECTRODE) ×3 IMPLANT
SLEEVE SCD COMPRESS KNEE MED (MISCELLANEOUS) ×3 IMPLANT
SPONGE LAP 4X18 X RAY DECT (DISPOSABLE) ×3 IMPLANT
STRIP CLOSURE SKIN 1/2X4 (GAUZE/BANDAGES/DRESSINGS) ×2 IMPLANT
SUT MNCRL AB 4-0 PS2 18 (SUTURE) ×3 IMPLANT
SUT MON AB 5-0 PS2 18 (SUTURE) IMPLANT
SUT SILK 2 0 SH (SUTURE) IMPLANT
SUT VIC AB 2-0 SH 27 (SUTURE) ×2
SUT VIC AB 2-0 SH 27XBRD (SUTURE) ×1 IMPLANT
SUT VIC AB 3-0 SH 27 (SUTURE) ×2
SUT VIC AB 3-0 SH 27X BRD (SUTURE) ×1 IMPLANT
SYR CONTROL 10ML LL (SYRINGE) ×3 IMPLANT
TOWEL OR 17X24 6PK STRL BLUE (TOWEL DISPOSABLE) ×3 IMPLANT
TOWEL OR NON WOVEN STRL DISP B (DISPOSABLE) IMPLANT
TRAY DSU PREP LF (CUSTOM PROCEDURE TRAY) ×3 IMPLANT
TUBE CONNECTING 20'X1/4 (TUBING)
TUBE CONNECTING 20X1/4 (TUBING) IMPLANT
YANKAUER SUCT BULB TIP NO VENT (SUCTIONS) IMPLANT

## 2017-08-17 NOTE — Transfer of Care (Signed)
Immediate Anesthesia Transfer of Care Note  Patient: Nancy Austin  Procedure(s) Performed: Procedure(s): LEFT RADIOACTIVE SEED GUIDED EXCISIONAL BREAST BIOPSY ERAS PATHWAY (Left)  Patient Location: PACU  Anesthesia Type:General  Level of Consciousness: sedated  Airway & Oxygen Therapy: Patient Spontanous Breathing and Patient connected to face mask oxygen  Post-op Assessment: Report given to RN and Post -op Vital signs reviewed and stable  Post vital signs: Reviewed and stable  Last Vitals:  Vitals:   08/17/17 0730 08/17/17 0752  BP: 106/67 92/60  Pulse: (!) 54   Resp: 18   Temp: 36.7 C   SpO2: 100%     Last Pain:  Vitals:   08/17/17 0730  TempSrc: Oral         Complications: No apparent anesthesia complications

## 2017-08-17 NOTE — Op Note (Signed)
Preoperative diagnoses: left breast calcifications not amenable to core biopsy, prior history of lumpectomy/radiotherapy on left Postoperative diagnosis: Same as above Procedure: Left breast seed excisional biopsy Surgeon: Dr. Serita Grammes Anesthesia: Gen. Estimated blood loss: minimal Complications: None Drains: None Specimens: Left breast tissue marked with paint Sponge and needle count correct at completion Disposition to recovery stable  Indications: This is a 70 yof who has prior treatment for left breast cancer including lumpectomy and radiotherapy. She has new calcifications that are not amenable to core biopsy.  We discussed seed placement and excisional biopsy.   Procedure: After informed consent was obtained she was then taken to the operating room. She was given ancef. Sequential compression devices were on her legs. She was placed under general anesthesia without complication. Her chestwas then prepped and draped in the standard sterile surgical fashion. A surgical timeout was then performed. The seed was in the lower left breast near the inframammary fold.  This was a ways from her old incision. I infiltrated marcaine and made a new incision in the IM fold.  I then used the neoprobe to guide excision of the seedand surrounding tissue. The deep margin is the muscle. Mammogram confirmed removal of seed. The clip was not at the site of the seed.. This was then all sent to pathology. Hemostasis was observed. I closed the breast tissue with a 2-0 Vicryl. The dermis was closed with 3-0 Vicryl and the skin with 4-0 Monocryl.Dermabond and steristrips were placed on the incision. A breast binder was placed. She was transferred to recovery stable

## 2017-08-17 NOTE — Interval H&P Note (Signed)
History and Physical Interval Note:  08/17/2017 8:09 AM  Nancy Austin  has presented today for surgery, with the diagnosis of left breast calcifications  The various methods of treatment have been discussed with the patient and family. After consideration of risks, benefits and other options for treatment, the patient has consented to  Procedure(s): LEFT RADIOACTIVE SEED GUIDED EXCISIONAL BREAST BIOPSY ERAS PATHWAY (Left) as a surgical intervention .  The patient's history has been reviewed, patient examined, no change in status, stable for surgery.  I have reviewed the patient's chart and labs.  Questions were answered to the patient's satisfaction.     Miyu Fenderson

## 2017-08-17 NOTE — H&P (Signed)
Nancy Austin is an 41 y.o. female.   Chief Complaint: left breast mammographic abnormality HPI: 10 yof who was seen in October 2015 with new left breast cancer.She had a mm with a 1.1x1 cm mass in the left breast. MRI showed a 1.7x1.7x1.6 cm mass with negative nodes. This underwent biopsy that shows an IDC grade II, er positive at 100%, pr pos at 89%, her 2 positive and Ki is 16%. She underwent primary chemotherapy along with dual antiher2 therapy. she then underwent left lumpectomy/snn biopsy for residual ypT1cypN1a, triple positive. finished year of herceptin. on tamoxifen now. has undergone lap bso. has also undergone port removal and excision of forehead mass. she is doing well overall. she felt a nodule in left breast noted on her exam as well as by Dr Marvel Plan. she underwent evaluation with mm/us that showed a ridge of nl tissue at site of concern but also has new pleomorphic calcs near lumpectomy site. she has no issues at all. no discharge. this was attempted to be biopsied and unsuccessful.    Past Medical History:  Diagnosis Date  . Benign skin lesion of forehead 10/2015  . Breast cancer ([place])   . Cancer ([place])   . History of breast cancer 09/2014   left  . History of chemotherapy    finished 02/10/2015  . History of radiation therapy 04/16/2015 - 06/02/2015  . Personal history of radiation therapy     Past Surgical History:  Procedure Laterality Date  . BREAST LUMPECTOMY    . LAPAROSCOPIC SALPINGO OOPHERECTOMY Bilateral 11/12/2015   Procedure: LAPAROSCOPIC BILATERAL SALPINGO OOPHORECTOMY;  Surgeon: Paula Compton, MD;  Location: [facility];  Service: Gynecology;  Laterality: Bilateral;  . LESION REMOVAL Left 10/22/2015   Procedure: EXCISION OF FOREHEAD LESION 1X1CM;  Surgeon: Rolm Bookbinder, MD;  Location: [facility];  Service: General;  Laterality: Left;  . PORT-A-CATH REMOVAL N/A 10/22/2015   Procedure: REMOVAL PORT-A-CATH;  Surgeon: Rolm Bookbinder, MD;   Location: [facility];  Service: General;  Laterality: N/A;  . PORTACATH PLACEMENT N/A 10/15/2014   Procedure: INSERTION PORT-A-CATH;  Surgeon: Rolm Bookbinder, MD;  Location: [facility];  Service: General;  Laterality: N/A;  . RADIOACTIVE SEED GUIDED MASTECTOMY WITH AXILLARY SENTINEL LYMPH NODE BIOPSY Left 03/06/2015   Procedure: RADIOACTIVE SEED GUIDED PARTIAL MASTECTOMY WITH AXILLARY SENTINEL LYMPH NODE BIOPSY;  Surgeon: Rolm Bookbinder, MD;  Location: [facility];  Service: General;  Laterality: Left;  . TONSILLECTOMY  1996    Family History  Problem Relation Age of Onset  . Skin cancer Father   . Hyperlipidemia Father   . Prostate cancer Maternal Uncle 1       currently 47  . Multiple myeloma Paternal Grandmother 56       Deceased 67  . Cancer Paternal Grandmother        bone  . Prostate cancer Paternal Uncle   . Lung disease Maternal Grandmother   . AAA (abdominal aortic aneurysm) Maternal Grandmother   . Stroke Maternal Grandfather    Social History:  reports that she has never smoked. She has never used smokeless tobacco. She reports that she drinks alcohol. She reports that she does not use drugs.  Allergies:  Allergies  Allergen Reactions  . Chlorhexidine Rash    itching  . Morphine And Related Nausea And Vomiting  . Tegaderm Ag Mesh [Silver] Rash    Medications Prior to Admission  Medication Sig Dispense Refill  . gabapentin (NEURONTIN) 300 MG capsule Take  1 capsule (300 mg total) by mouth at bedtime. 90 capsule 3  . meloxicam (MOBIC) 15 MG tablet TAKE 1 TABLET BY MOUTH EVERY DAY 30 tablet 1  . Multiple Vitamin (MULTIVITAMIN) tablet Take 1 tablet by mouth daily.    . tamoxifen (NOLVADEX) 20 MG tablet Take 1 tablet (20 mg total) by mouth daily. 90 tablet 3  . diphenhydrAMINE (BENADRYL) 25 MG tablet Take 25 mg by mouth at bedtime as needed for sleep. Reported on 06/07/2016      No results found for this or any previous visit (from the  past 48 hour(s)). Mm Lt Radioactive Seed Loc Mammo Guide  Result Date: 08/16/2017 CLINICAL DATA:  Patient presents for seed localization of left breast calcifications prior to excision. Previous attempt at stereotactic guided core biopsy was unsuccessful due to the posterior location of the calcifications. History of left breast cancer and lumpectomy in 2015. EXAM: MAMMOGRAPHIC GUIDED RADIOACTIVE SEED LOCALIZATION OF THE LEFT BREAST COMPARISON:  Previous exam(s). FINDINGS: Patient presents for radioactive seed localization prior to excision. I met with the patient and we discussed the procedure of seed localization including benefits and alternatives. We discussed the high likelihood of a successful procedure. We discussed the risks of the procedure including infection, bleeding, tissue injury and further surgery. We discussed the low dose of radioactivity involved in the procedure. Informed, written consent was given. The usual time-out protocol was performed immediately prior to the procedure. Using mammographic guidance, sterile technique, 1% lidocaine and an I-125 radioactive seed, the calcifications in the lower outer quadrant of the left breast were localized using a lateral approach. The follow-up mammogram images confirm the seed in the expected location and were marked for Dr. Donne Hazel. Follow-up survey of the patient confirms presence of the radioactive seed. Order number of I-125 seed:  [id number]. Total activity:  0.250  Reference Date: 08/08/2017 The patient tolerated the procedure well and was released from the [facility]. She was given instructions regarding seed removal. IMPRESSION: Radioactive seed localization left breast. No apparent complications. Of note, radioactive seed marks the calcifications rather than the tissue marker clip, placed at the time of attempted biopsy. Electronically Signed   By: Nolon Nations M.D.   On: 08/16/2017 10:55    Review of Systems  All other systems  reviewed and are negative.   Blood pressure 92/60, pulse (!) 54, temperature 98.1 F (36.7 C), temperature source Oral, resp. rate 18, height 5' 6"  (1.676 m), weight 61.7 kg (136 lb), SpO2 100 %. Physical Exam  Weight: 139 lb Height: 66in Body Surface Area: 1.71 m Body Mass Index: 22.43 kg/m  Pulse: 48 (Regular)  BP: 114/70 (Sitting, Left Arm, Standard) Physical Exam Rolm Bookbinder MD; 07/19/2017 8:38 AM) Breast Nipples Discharge - Left - None. Note: no left breast mass, rash from chloraprep Lymphatic Note: no left axillary lad cv rrr Lungs clear  Assessment/Plan CARCINOMA OF UPPER-OUTER QUADRANT OF FEMALE BREAST, LEFT (C50.412) CALCIFICATION OF LEFT BREAST ON MAMMOGRAPHY (R92.1) Story: these are likely benign but need to be biopsied. I am going to see if another attempt at stereo is reasonable first. if not or if unsuccessful (or if on biopsy needs excision) will proceed with seed guided excisional biopsy, unable to do this- will proceed with seed guided excision   Ramesses Crampton, MD 08/17/2017, 8:04 AM

## 2017-08-17 NOTE — Anesthesia Procedure Notes (Signed)
Procedure Name: LMA Insertion Date/Time: 08/17/2017 8:25 AM Performed by: Maryella Shivers Pre-anesthesia Checklist: Patient identified, Emergency Drugs available, Suction available and Patient being monitored Patient Re-evaluated:Patient Re-evaluated prior to induction Oxygen Delivery Method: Circle system utilized Preoxygenation: Pre-oxygenation with 100% oxygen Induction Type: IV induction Ventilation: Mask ventilation without difficulty LMA: LMA inserted LMA Size: 4.0 Number of attempts: 1 Airway Equipment and Method: Bite block Placement Confirmation: positive ETCO2 Tube secured with: Tape Dental Injury: Teeth and Oropharynx as per pre-operative assessment

## 2017-08-17 NOTE — Anesthesia Postprocedure Evaluation (Signed)
Anesthesia Post Note  Patient: Nancy Austin  Procedure(s) Performed: Procedure(s) (LRB): LEFT RADIOACTIVE SEED GUIDED EXCISIONAL BREAST BIOPSY ERAS PATHWAY (Left)     Patient location during evaluation: PACU Anesthesia Type: General Level of consciousness: sedated and patient cooperative Pain management: pain level controlled Vital Signs Assessment: post-procedure vital signs reviewed and stable Respiratory status: spontaneous breathing Cardiovascular status: stable Anesthetic complications: no    Last Vitals:  Vitals:   08/17/17 0930 08/17/17 0958  BP: 102/68 113/65  Pulse: (!) 47 (!) 50  Resp: 16 16  Temp:  36.5 C  SpO2: 100% 100%    Last Pain:  Vitals:   08/17/17 0958  TempSrc:   PainSc: 0-No pain                 Nolon Nations

## 2017-08-17 NOTE — Discharge Instructions (Signed)
[facility] °Office Phone Number [phone] ° °POST OP INSTRUCTIONS ° °Always review your discharge instruction sheet given to you by the facility where your surgery was performed. ° °IF YOU HAVE DISABILITY OR FAMILY LEAVE FORMS, YOU MUST BRING THEM TO THE OFFICE FOR PROCESSING.  DO NOT GIVE THEM TO YOUR DOCTOR. ° °1. A prescription for pain medication may be given to you upon discharge.  Take your pain medication as prescribed, if needed.  If narcotic pain medicine is not needed, then you may take acetaminophen (Tylenol), naprosyn (Alleve) or ibuprofen (Advil) as needed. °2. Take your usually prescribed medications unless otherwise directed °3. If you need a refill on your pain medication, please contact your pharmacy.  They will contact our office to request authorization.  Prescriptions will not be filled after 5pm or on week-ends. °4. You should eat very light the first 24 hours after surgery, such as soup, crackers, pudding, etc.  Resume your normal diet the day after surgery. °5. Most patients will experience some swelling and bruising in the breast.  Ice packs and a good support bra will help.  Wear the breast binder provided or a sports bra for 72 hours day and night.  After that wear a sports bra during the day until you return to the office. Swelling and bruising can take several days to resolve.  °6. It is common to experience some constipation if taking pain medication after surgery.  Increasing fluid intake and taking a stool softener will usually help or prevent this problem from occurring.  A mild laxative (Milk of Magnesia or Miralax) should be taken according to package directions if there are no bowel movements after 48 hours. °7. Unless discharge instructions indicate otherwise, you may remove your bandages 48 hours after surgery and you may shower at that time.  You may have steri-strips (small skin tapes) in place directly over the incision.  These strips should be left on the  skin for 7-10 days and will come off on their own.  If your surgeon used skin glue on the incision, you may shower in 24 hours.  The glue will flake off over the next 2-3 weeks.  Any sutures or staples will be removed at the office during your follow-up visit. °8. ACTIVITIES:  You may resume regular daily activities (gradually increasing) beginning the next day.  Wearing a good support bra or sports bra minimizes pain and swelling.  You may have sexual intercourse when it is comfortable. °a. You may drive when you no longer are taking prescription pain medication, you can comfortably wear a seatbelt, and you can safely maneuver your car and apply brakes. °b. RETURN TO WORK:  ______________________________________________________________________________________ °9. You should see your doctor in the office for a follow-up appointment approximately two weeks after your surgery.  Your doctor’s nurse will typically make your follow-up appointment when she calls you with your pathology report.  Expect your pathology report 3-4 business days after your surgery.  You may call to check if you do not hear from us after three days. °10. OTHER INSTRUCTIONS: _______________________________________________________________________________________________ _____________________________________________________________________________________________________________________________________ °_____________________________________________________________________________________________________________________________________ °_____________________________________________________________________________________________________________________________________ ° °WHEN TO CALL DR WAKEFIELD: °1. Fever over 101.0 °2. Nausea and/or vomiting. °3. Extreme swelling or bruising. °4. Continued bleeding from incision. °5. Increased pain, redness, or drainage from the incision. ° °The clinic staff is available to answer your questions during regular  business hours.  Please don’t hesitate to call and ask to speak to one of the nurses for clinical concerns.  If   you have a medical emergency, go to the nearest emergency room or call 911.  A surgeon from [facility] is always on call at the hospital. ° °For further questions, please visit [email or web address] ° ° ° ° ° °Post Anesthesia Home Care Instructions ° °Activity: °Get plenty of rest for the remainder of the day. A responsible individual must stay with you for 24 hours following the procedure.  °For the next 24 hours, DO NOT: °-Drive a car °-Operate machinery °-Drink alcoholic beverages °-Take any medication unless instructed by your physician °-Make any legal decisions or sign important papers. ° °Meals: °Start with liquid foods such as gelatin or soup. Progress to regular foods as tolerated. Avoid greasy, spicy, heavy foods. If nausea and/or vomiting occur, drink only clear liquids until the nausea and/or vomiting subsides. Call your physician if vomiting continues. ° °Special Instructions/Symptoms: °Your throat may feel dry or sore from the anesthesia or the breathing tube placed in your throat during surgery. If this causes discomfort, gargle with warm salt water. The discomfort should disappear within 24 hours. ° °If you had a scopolamine patch placed behind your ear for the management of post- operative nausea and/or vomiting: ° °1. The medication in the patch is effective for 72 hours, after which it should be removed.  Wrap patch in a tissue and discard in the trash. Wash hands thoroughly with soap and water. °2. You may remove the patch earlier than 72 hours if you experience unpleasant side effects which may include dry mouth, dizziness or visual disturbances. °3. Avoid touching the patch. Wash your hands with soap and water after contact with the patch. °  ° °

## 2017-08-17 NOTE — Anesthesia Preprocedure Evaluation (Signed)
Anesthesia Evaluation  Patient identified by MRN, date of birth, ID band Patient awake    Reviewed: Allergy & Precautions, NPO status , Patient's Chart, lab work & pertinent test results  Airway Mallampati: I  TM Distance: >3 FB     Dental no notable dental hx. (+) Teeth Intact   Pulmonary neg pulmonary ROS,    Pulmonary exam normal breath sounds clear to auscultation       Cardiovascular Normal cardiovascular exam Rhythm:Regular Rate:Normal     Neuro/Psych Chemotherapy induced Neuropathy  Neuromuscular disease negative psych ROS   GI/Hepatic   Endo/Other  Hx/o Breast Ca  Renal/GU   negative genitourinary   Musculoskeletal negative musculoskeletal ROS (+)   Abdominal   Peds  Hematology negative hematology ROS (+)   Anesthesia Other Findings   Reproductive/Obstetrics BRCA +                             Anesthesia Physical  Anesthesia Plan  ASA: II  Anesthesia Plan: General   Post-op Pain Management:    Induction: Intravenous  PONV Risk Score and Plan: 4 or greater and Ondansetron, Dexamethasone, Midazolam and Scopolamine patch - Pre-op  Airway Management Planned: LMA  Additional Equipment:   Intra-op Plan:   Post-operative Plan: Extubation in OR  Informed Consent: I have reviewed the patients History and Physical, chart, labs and discussed the procedure including the risks, benefits and alternatives for the proposed anesthesia with the patient or authorized representative who has indicated his/her understanding and acceptance.   Dental advisory given  Plan Discussed with: CRNA  Anesthesia Plan Comments:         Anesthesia Quick Evaluation

## 2017-08-18 ENCOUNTER — Encounter (HOSPITAL_BASED_OUTPATIENT_CLINIC_OR_DEPARTMENT_OTHER): Payer: Self-pay | Admitting: General Surgery

## 2017-08-21 ENCOUNTER — Other Ambulatory Visit: Payer: Self-pay | Admitting: Oncology

## 2017-08-22 ENCOUNTER — Other Ambulatory Visit: Payer: Self-pay | Admitting: Oncology

## 2017-08-31 DIAGNOSIS — Z923 Personal history of irradiation: Secondary | ICD-10-CM | POA: Diagnosis not present

## 2017-08-31 DIAGNOSIS — Z853 Personal history of malignant neoplasm of breast: Secondary | ICD-10-CM | POA: Diagnosis not present

## 2017-08-31 DIAGNOSIS — D0512 Intraductal carcinoma in situ of left breast: Secondary | ICD-10-CM | POA: Diagnosis not present

## 2017-09-05 DIAGNOSIS — D0512 Intraductal carcinoma in situ of left breast: Secondary | ICD-10-CM | POA: Diagnosis not present

## 2017-09-05 DIAGNOSIS — L599 Disorder of the skin and subcutaneous tissue related to radiation, unspecified: Secondary | ICD-10-CM | POA: Diagnosis not present

## 2017-09-05 DIAGNOSIS — Z853 Personal history of malignant neoplasm of breast: Secondary | ICD-10-CM | POA: Diagnosis not present

## 2017-09-08 ENCOUNTER — Other Ambulatory Visit: Payer: Self-pay | Admitting: General Surgery

## 2017-09-09 DIAGNOSIS — Z853 Personal history of malignant neoplasm of breast: Secondary | ICD-10-CM | POA: Diagnosis not present

## 2017-09-09 DIAGNOSIS — C50812 Malignant neoplasm of overlapping sites of left female breast: Secondary | ICD-10-CM | POA: Diagnosis not present

## 2017-09-21 ENCOUNTER — Encounter (HOSPITAL_COMMUNITY): Payer: Self-pay

## 2017-09-21 ENCOUNTER — Encounter (HOSPITAL_COMMUNITY)
Admission: RE | Admit: 2017-09-21 | Discharge: 2017-09-21 | Disposition: A | Discharge status: Home / Self Care | Payer: 59 | Source: Ambulatory Visit | Attending: General Surgery | Admitting: General Surgery

## 2017-09-21 ENCOUNTER — Other Ambulatory Visit (HOSPITAL_COMMUNITY): Payer: Self-pay | Admitting: *Deleted

## 2017-09-21 DIAGNOSIS — Z923 Personal history of irradiation: Secondary | ICD-10-CM | POA: Diagnosis not present

## 2017-09-21 DIAGNOSIS — D0512 Intraductal carcinoma in situ of left breast: Secondary | ICD-10-CM | POA: Diagnosis not present

## 2017-09-21 DIAGNOSIS — Z01818 Encounter for other preprocedural examination: Secondary | ICD-10-CM | POA: Insufficient documentation

## 2017-09-21 DIAGNOSIS — Z853 Personal history of malignant neoplasm of breast: Secondary | ICD-10-CM | POA: Diagnosis not present

## 2017-09-21 HISTORY — DX: Anemia, unspecified: D64.9

## 2017-09-21 LAB — CBC
HEMATOCRIT: 39.9 % (ref 36.0–46.0)
HEMOGLOBIN: 12.9 g/dL (ref 12.0–15.0)
MCH: 29.3 pg (ref 26.0–34.0)
MCHC: 32.3 g/dL (ref 30.0–36.0)
MCV: 90.5 fL (ref 78.0–100.0)
Platelets: 217 10*3/uL (ref 150–400)
RBC: 4.41 MIL/uL (ref 3.87–5.11)
RDW: 13.2 % (ref 11.5–15.5)
WBC: 5.1 10*3/uL (ref 4.0–10.5)

## 2017-09-21 LAB — BASIC METABOLIC PANEL
ANION GAP: 6 (ref 5–15)
BUN: 12 mg/dL (ref 6–20)
CO2: 28 mmol/L (ref 22–32)
Calcium: 9.1 mg/dL (ref 8.9–10.3)
Chloride: 106 mmol/L (ref 101–111)
Creatinine, Ser: 0.96 mg/dL (ref 0.44–1.00)
Glucose, Bld: 84 mg/dL (ref 65–99)
POTASSIUM: 3.8 mmol/L (ref 3.5–5.1)
SODIUM: 140 mmol/L (ref 135–145)

## 2017-09-21 NOTE — Pre-Procedure Instructions (Addendum)
Nancy Austin  09/21/2017    Your procedure is scheduled on Monday, September 26, 2017 at 9:00 AM.   Report to [facility] Entrance "A" Admitting Office at 7:00 AM.   Call this number if you have problems the morning of surgery: [phone]   Questions prior to day of surgery, please call [phone] between 8 & 4 PM.   Remember:  Do not eat food or drink liquids after midnight Sunday, 09/25/17.  Take these medicines the morning of surgery with A SIP OF WATER: Tamoxifen (Nolvadex)  Stop NSAIDS (Meloxicam, Aleve, Ibuprofen, etc) 5 days prior to surgery.  Drink "Boost" 2 hours prior to arrival time day of surgery. Drink it all by 5:00 AM.   Do not wear jewelry, make-up or nail polish.  Do not wear lotions, powders, perfumes or deodorant.  Do not shave 48 hours prior to surgery.   Do not bring valuables to the hospital.  [facility] is not responsible for any belongings or valuables.  Contacts, dentures or bridgework may not be worn into surgery.  Leave your suitcase in the car.  After surgery it may be brought to your room.  For patients admitted to the hospital, discharge time will be determined by your treatment team.  Patients discharged the day of surgery will not be allowed to drive home.   [facility] - Preparing for Surgery  Before surgery, you can play an important role.  Because skin is not sterile, your skin needs to be as free of germs as possible.  You can reduce the number of germs on you skin by washing with CHG (chlorahexidine gluconate) soap before surgery.  CHG is an antiseptic cleaner which kills germs and bonds with the skin to continue killing germs even after washing.  Please DO NOT use if you have an allergy to CHG or antibacterial soaps.  If your skin becomes reddened/irritated stop using the CHG and inform your nurse when you arrive at Short Stay.  Do not shave (including legs and underarms) for at least 48 hours prior to the first CHG shower.   You may shave your face.  Please follow these instructions carefully:   1.  Shower with CHG Soap the night before surgery and the                    morning of Surgery.  2.  If you choose to wash your hair, wash your hair first as usual with your       normal shampoo.  3.  After you shampoo, rinse your hair and body thoroughly to remove the shampoo.  4.  Use CHG as you would any other liquid soap.  You can apply chg directly       to the skin and wash gently with scrungie or a clean washcloth.  5.  Apply the CHG Soap to your body ONLY FROM THE NECK DOWN.        Do not use on open wounds or open sores.  Avoid contact with your eyes, ears, mouth and genitals (private parts).  Wash genitals (private parts) with your normal soap.  6.  Wash thoroughly, paying special attention to the area where your surgery        will be performed.  7.  Thoroughly rinse your body with warm water from the neck down.  8.  DO NOT shower/wash with your normal soap after using and rinsing off       the  CHG Soap.  9.  Pat yourself dry with a clean towel.            10.  Wear clean pajamas.            11.  Place clean sheets on your bed the night of your first shower and do not        sleep with pets.  Day of Surgery  Do not apply any lotions/deodorants the morning of surgery.  Please wear clean clothes to the hospital.   Please read over the fact sheets that you were given.

## 2017-09-21 NOTE — H&P (Signed)
  Subjective:     Patient ID: Nancy Austin is a 41 y.o. female.  HPI  Here for follow up discussion breast reconstruction prior to planned bilateral NSM. History significant for left breast triple positive breast ca diagnosed 09/2014. She underwent neoadjuvant chemotherapy followed by lumpectomy/SLN with residual ypT1c ypN1a tumor. She completed 1 year adjuvant Herceptin and adjuvant radiation complete 05/2015. She represented with palpable nodule in left breast. MMG/US showed new pleomorphic calcs near lumpectomy site. She underwent open biopsy with DCIS noted, ER/PR-. Given prior treatment, mastectomy recommended. Patient desires bilateral.  Genetics 2015 normal.   Prior to lumpectomy B cup. Current similar volume, did not note significant change volume. Wt stable  Husband is Immunologist at [employer]. Patient is now home maker, two children at home.   Review of Systems     Objective:   Physical Exam  Cardiovascular: Normal rate, regular rhythm and normal heart sounds.   Pulmonary/Chest: Effort normal and breath sounds normal.  Abdominal:  Small redundant tissue no hernia   No ptosis no palpable masse Left breast scars within IMF Sn to nipple R 22 L 22 cm BW R 12 L 12 (CW 12) Nipple to IMF 7 L 7 cm    Assessment:     Left breast DCIS ER/PR- History left breast triple positive IDC, s/p lumpectomy SLN History adjuvant radiation    Plan:     Plan bilateral NSM with immediate expander reconstruction, left latissimus flap, right acellular dermis and dual plane position.   Reviewed reconstruction, NSM will be asensate and not stimulate. Reviewed risks mastectomy flap necrosis requiring additional surgery.  Discussed use of ADM in reconstruction, cadaveric source. Discussed use of ADM in breast reconstruction, cadaveric source, incorporation over weeks. Reviewed if problems of seroma or infection in early period prior to incorporation can contribute to infection and may  require removal.  Given her history radiation,reviewed this will increase her risks of reconstruction including wound healing problems and contracture. Autologous tissue would reduce these risks back to baseline. She has had additional consultation at [facility], offered TE/ADM alone. At this time she agrees to LD flap on left. Discussed back donor site scar, drains, post procedure compression, limitations, visits. Reviewed risk seroma, weakness with LD. Additional risks including but not limited to bleeding, infection, anesthesia, scar seroma, hematoma,damage to adjacent structures, need for additional surgery, asymmetry, DVT/PE reviewed.  Op Site was ok, had rash to [place], MD [facility] [phone], pin [phone]

## 2017-09-21 NOTE — Progress Notes (Signed)
Pt denies cardiac history, chest pain or sob. Pt states she is not diabetic. Pt was given Boost drink for ERAS diet.

## 2017-09-23 ENCOUNTER — Other Ambulatory Visit (HOSPITAL_COMMUNITY): Payer: 59

## 2017-09-26 ENCOUNTER — Encounter (HOSPITAL_COMMUNITY): Payer: Self-pay | Admitting: Anesthesiology

## 2017-09-26 ENCOUNTER — Encounter (HOSPITAL_COMMUNITY)
Admission: RE | Disposition: A | Discharge status: Home / Self Care | Payer: Self-pay | Source: Ambulatory Visit | Attending: General Surgery

## 2017-09-26 ENCOUNTER — Observation Stay (HOSPITAL_COMMUNITY)
Admission: RE | Admit: 2017-09-26 | Discharge: 2017-09-27 | Disposition: A | Discharge status: Home / Self Care | Payer: 59 | Source: Ambulatory Visit | Attending: General Surgery | Admitting: General Surgery

## 2017-09-26 ENCOUNTER — Inpatient Hospital Stay (HOSPITAL_COMMUNITY): Payer: 59 | Admitting: Certified Registered"

## 2017-09-26 DIAGNOSIS — D0512 Intraductal carcinoma in situ of left breast: Secondary | ICD-10-CM | POA: Diagnosis not present

## 2017-09-26 DIAGNOSIS — R921 Mammographic calcification found on diagnostic imaging of breast: Secondary | ICD-10-CM | POA: Diagnosis not present

## 2017-09-26 DIAGNOSIS — Z4001 Encounter for prophylactic removal of breast: Secondary | ICD-10-CM | POA: Diagnosis not present

## 2017-09-26 DIAGNOSIS — Z853 Personal history of malignant neoplasm of breast: Secondary | ICD-10-CM | POA: Diagnosis not present

## 2017-09-26 DIAGNOSIS — C50412 Malignant neoplasm of upper-outer quadrant of left female breast: Secondary | ICD-10-CM | POA: Diagnosis not present

## 2017-09-26 DIAGNOSIS — Z79899 Other long term (current) drug therapy: Secondary | ICD-10-CM | POA: Insufficient documentation

## 2017-09-26 DIAGNOSIS — I739 Peripheral vascular disease, unspecified: Secondary | ICD-10-CM | POA: Diagnosis not present

## 2017-09-26 DIAGNOSIS — G8918 Other acute postprocedural pain: Secondary | ICD-10-CM | POA: Diagnosis not present

## 2017-09-26 DIAGNOSIS — M545 Low back pain: Secondary | ICD-10-CM | POA: Diagnosis not present

## 2017-09-26 DIAGNOSIS — Z888 Allergy status to other drugs, medicaments and biological substances status: Secondary | ICD-10-CM | POA: Diagnosis not present

## 2017-09-26 DIAGNOSIS — N6011 Diffuse cystic mastopathy of right breast: Secondary | ICD-10-CM | POA: Diagnosis not present

## 2017-09-26 HISTORY — PX: LATISSIMUS FLAP TO BREAST: SHX5357

## 2017-09-26 HISTORY — PX: NIPPLE SPARING MASTECTOMY: SHX6537

## 2017-09-26 HISTORY — PX: BREAST RECONSTRUCTION WITH PLACEMENT OF TISSUE EXPANDER AND FLEX HD (ACELLULAR HYDRATED DERMIS): SHX6295

## 2017-09-26 SURGERY — BREAST RECONSTRUCTION WITH PLACEMENT OF TISSUE EXPANDER AND FLEX HD (ACELLULAR HYDRATED DERMIS)
Anesthesia: General | Laterality: Right

## 2017-09-26 SURGERY — MASTECTOMY, NIPPLE SPARING
Anesthesia: General | Site: Breast | Laterality: Right

## 2017-09-26 MED ORDER — DEXAMETHASONE SODIUM PHOSPHATE 10 MG/ML IJ SOLN
INTRAMUSCULAR | Status: DC | PRN
  Administered 2017-09-26: 10 mg via INTRAVENOUS

## 2017-09-26 MED ORDER — PROPOFOL 500 MG/50ML IV EMUL
INTRAVENOUS | Status: DC | PRN
  Administered 2017-09-26: 125 ug/kg/min via INTRAVENOUS

## 2017-09-26 MED ORDER — ALBUMIN HUMAN 5 % IV SOLN
INTRAVENOUS | Status: DC | PRN
  Administered 2017-09-26: 13:00:00 via INTRAVENOUS

## 2017-09-26 MED ORDER — BUPIVACAINE-EPINEPHRINE (PF) 0.25% -1:200000 IJ SOLN
INTRAMUSCULAR | Status: AC
  Filled 2017-09-26: qty 30

## 2017-09-26 MED ORDER — ENSURE PRE-SURGERY PO LIQD: 296.0000 mL | Freq: Once | ORAL | Status: DC

## 2017-09-26 MED ORDER — HYDROMORPHONE HCL 1 MG/ML IJ SOLN
0.5000 mg | INTRAMUSCULAR | Status: DC | PRN
  Administered 2017-09-26: 0.5 mg via INTRAVENOUS
  Filled 2017-09-26: qty 1

## 2017-09-26 MED ORDER — LIDOCAINE HCL (CARDIAC) 20 MG/ML IV SOLN
INTRAVENOUS | Status: DC | PRN
  Administered 2017-09-26: 60 mg via INTRAVENOUS

## 2017-09-26 MED ORDER — LACTATED RINGERS IV SOLN: INTRAVENOUS | Status: DC

## 2017-09-26 MED ORDER — FENTANYL CITRATE (PF) 100 MCG/2ML IJ SOLN
100.0000 ug | Freq: Once | INTRAMUSCULAR | Status: AC
  Administered 2017-09-26 (×2): 50 ug via INTRAVENOUS
  Administered 2017-09-26: 100 ug via INTRAVENOUS
  Administered 2017-09-26 (×5): 50 ug via INTRAVENOUS

## 2017-09-26 MED ORDER — ONDANSETRON HCL 4 MG/2ML IJ SOLN: 4.0000 mg | Freq: Four times a day (QID) | INTRAMUSCULAR | Status: DC | PRN

## 2017-09-26 MED ORDER — PROPOFOL 1000 MG/100ML IV EMUL
INTRAVENOUS | Status: AC
  Filled 2017-09-26: qty 100

## 2017-09-26 MED ORDER — METHOCARBAMOL 500 MG PO TABS
500.0000 mg | ORAL_TABLET | Freq: Four times a day (QID) | ORAL | Status: DC | PRN
  Administered 2017-09-26 – 2017-09-27 (×2): 500 mg via ORAL
  Filled 2017-09-26 (×2): qty 1

## 2017-09-26 MED ORDER — ACETAMINOPHEN 500 MG PO TABS
1000.0000 mg | ORAL_TABLET | ORAL | Status: AC
  Administered 2017-09-26: 1000 mg via ORAL
  Filled 2017-09-26: qty 2

## 2017-09-26 MED ORDER — CELECOXIB 200 MG PO CAPS
200.0000 mg | ORAL_CAPSULE | ORAL | Status: AC
  Administered 2017-09-26: 200 mg via ORAL
  Filled 2017-09-26: qty 1

## 2017-09-26 MED ORDER — ONDANSETRON HCL 4 MG/2ML IJ SOLN
INTRAMUSCULAR | Status: AC
  Filled 2017-09-26: qty 2

## 2017-09-26 MED ORDER — GABAPENTIN 300 MG PO CAPS
300.0000 mg | ORAL_CAPSULE | ORAL | Status: AC
  Administered 2017-09-26: 300 mg via ORAL
  Filled 2017-09-26: qty 1

## 2017-09-26 MED ORDER — PHENYLEPHRINE 40 MCG/ML (10ML) SYRINGE FOR IV PUSH (FOR BLOOD PRESSURE SUPPORT)
PREFILLED_SYRINGE | INTRAVENOUS | Status: AC
  Filled 2017-09-26: qty 10

## 2017-09-26 MED ORDER — MIDAZOLAM HCL 2 MG/2ML IJ SOLN
INTRAMUSCULAR | Status: AC
  Filled 2017-09-26: qty 2

## 2017-09-26 MED ORDER — GABAPENTIN 300 MG PO CAPS
300.0000 mg | ORAL_CAPSULE | Freq: Every day | ORAL | Status: DC
  Administered 2017-09-26: 300 mg via ORAL
  Filled 2017-09-26: qty 1

## 2017-09-26 MED ORDER — SUCCINYLCHOLINE CHLORIDE 200 MG/10ML IV SOSY
PREFILLED_SYRINGE | INTRAVENOUS | Status: AC
  Filled 2017-09-26: qty 10

## 2017-09-26 MED ORDER — LIDOCAINE HCL 4 % EX SOLN
CUTANEOUS | Status: DC | PRN
  Administered 2017-09-26: 4 mL via TOPICAL

## 2017-09-26 MED ORDER — DEXAMETHASONE SODIUM PHOSPHATE 10 MG/ML IJ SOLN
INTRAMUSCULAR | Status: AC
  Filled 2017-09-26: qty 1

## 2017-09-26 MED ORDER — FENTANYL CITRATE (PF) 100 MCG/2ML IJ SOLN
INTRAMUSCULAR | Status: AC
  Administered 2017-09-26: 100 ug via INTRAVENOUS
  Filled 2017-09-26: qty 2

## 2017-09-26 MED ORDER — CEFAZOLIN SODIUM-DEXTROSE 2-4 GM/100ML-% IV SOLN
2.0000 g | INTRAVENOUS | Status: AC
  Administered 2017-09-26 (×2): 2 g via INTRAVENOUS
  Filled 2017-09-26: qty 100

## 2017-09-26 MED ORDER — PROPOFOL 10 MG/ML IV BOLUS
INTRAVENOUS | Status: DC | PRN
Start: 2017-09-26 — End: 2017-09-26
  Administered 2017-09-26: 30 mg via INTRAVENOUS
  Administered 2017-09-26: 10 mg via INTRAVENOUS
  Administered 2017-09-26: 150 mg via INTRAVENOUS

## 2017-09-26 MED ORDER — SUGAMMADEX SODIUM 200 MG/2ML IV SOLN
INTRAVENOUS | Status: AC
  Filled 2017-09-26: qty 2

## 2017-09-26 MED ORDER — BUPIVACAINE-EPINEPHRINE (PF) 0.25% -1:200000 IJ SOLN
INTRAMUSCULAR | Status: DC | PRN
  Administered 2017-09-26 (×2): 30 mL via PERINEURAL

## 2017-09-26 MED ORDER — OXYCODONE HCL 5 MG PO TABS
5.0000 mg | ORAL_TABLET | ORAL | Status: DC | PRN
  Administered 2017-09-27: 5 mg via ORAL
  Filled 2017-09-26: qty 1

## 2017-09-26 MED ORDER — LACTATED RINGERS IV SOLN
INTRAVENOUS | Status: DC
  Administered 2017-09-26 (×3): via INTRAVENOUS

## 2017-09-26 MED ORDER — PROMETHAZINE HCL 25 MG/ML IJ SOLN: 6.2500 mg | INTRAMUSCULAR | Status: DC | PRN

## 2017-09-26 MED ORDER — HYDROMORPHONE HCL 1 MG/ML IJ SOLN: 0.2500 mg | INTRAMUSCULAR | Status: DC | PRN

## 2017-09-26 MED ORDER — MIDAZOLAM HCL 2 MG/2ML IJ SOLN
INTRAMUSCULAR | Status: AC
  Administered 2017-09-26: 2 mg via INTRAVENOUS
  Filled 2017-09-26: qty 2

## 2017-09-26 MED ORDER — FENTANYL CITRATE (PF) 250 MCG/5ML IJ SOLN
INTRAMUSCULAR | Status: AC
  Filled 2017-09-26: qty 5

## 2017-09-26 MED ORDER — CEFAZOLIN SODIUM-DEXTROSE 2-4 GM/100ML-% IV SOLN
2.0000 g | Freq: Three times a day (TID) | INTRAVENOUS | Status: DC
  Administered 2017-09-26 – 2017-09-27 (×2): 2 g via INTRAVENOUS
  Filled 2017-09-26 (×3): qty 100

## 2017-09-26 MED ORDER — ONDANSETRON HCL 4 MG/2ML IJ SOLN
INTRAMUSCULAR | Status: DC | PRN
  Administered 2017-09-26: 4 mg via INTRAVENOUS

## 2017-09-26 MED ORDER — ENSURE PRE-SURGERY PO LIQD: 592.0000 mL | Freq: Once | ORAL | Status: DC

## 2017-09-26 MED ORDER — ROCURONIUM BROMIDE 100 MG/10ML IV SOLN
INTRAVENOUS | Status: DC | PRN
  Administered 2017-09-26 (×2): 50 mg via INTRAVENOUS
  Administered 2017-09-26: 20 mg via INTRAVENOUS
  Administered 2017-09-26: 10 mg via INTRAVENOUS
  Administered 2017-09-26: 50 mg via INTRAVENOUS
  Administered 2017-09-26: 30 mg via INTRAVENOUS
  Administered 2017-09-26: 50 mg via INTRAVENOUS
  Administered 2017-09-26: 10 mg via INTRAVENOUS

## 2017-09-26 MED ORDER — 0.9 % SODIUM CHLORIDE (POUR BTL) OPTIME
TOPICAL | Status: DC | PRN
  Administered 2017-09-26 (×2): 1000 mL

## 2017-09-26 MED ORDER — GENTAMICIN SULFATE 40 MG/ML IJ SOLN
INTRAMUSCULAR | Status: DC | PRN
  Administered 2017-09-26: 1000 mL

## 2017-09-26 MED ORDER — BUPIVACAINE HCL (PF) 0.25 % IJ SOLN
INTRAMUSCULAR | Status: AC
  Filled 2017-09-26: qty 10

## 2017-09-26 MED ORDER — SCOPOLAMINE 1 MG/3DAYS TD PT72
MEDICATED_PATCH | TRANSDERMAL | Status: DC | PRN
  Administered 2017-09-26: 1 via TRANSDERMAL

## 2017-09-26 MED ORDER — SIMETHICONE 80 MG PO CHEW: 40.0000 mg | CHEWABLE_TABLET | Freq: Four times a day (QID) | ORAL | Status: DC | PRN

## 2017-09-26 MED ORDER — BUPIVACAINE-EPINEPHRINE 0.25% -1:200000 IJ SOLN
INTRAMUSCULAR | Status: DC | PRN
  Administered 2017-09-26: 10 mL

## 2017-09-26 MED ORDER — SODIUM CHLORIDE 0.9 % IV SOLN
Freq: Once | INTRAVENOUS | Status: DC
  Filled 2017-09-26: qty 1

## 2017-09-26 MED ORDER — MEPERIDINE HCL 25 MG/ML IJ SOLN: 6.2500 mg | INTRAMUSCULAR | Status: DC | PRN

## 2017-09-26 MED ORDER — PROCHLORPERAZINE MALEATE 10 MG PO TABS
10.0000 mg | ORAL_TABLET | Freq: Four times a day (QID) | ORAL | Status: DC | PRN
Start: 2017-09-26 — End: 2017-09-27
  Filled 2017-09-26: qty 1

## 2017-09-26 MED ORDER — SUGAMMADEX SODIUM 200 MG/2ML IV SOLN
INTRAVENOUS | Status: DC | PRN
  Administered 2017-09-26: 122.4 mg via INTRAVENOUS

## 2017-09-26 MED ORDER — ONDANSETRON 4 MG PO TBDP
4.0000 mg | ORAL_TABLET | Freq: Four times a day (QID) | ORAL | Status: DC | PRN
  Administered 2017-09-27: 4 mg via ORAL
  Filled 2017-09-26: qty 1

## 2017-09-26 MED ORDER — SODIUM CHLORIDE 0.9 % IV SOLN
INTRAVENOUS | Status: DC
  Administered 2017-09-26: 16:00:00 via INTRAVENOUS

## 2017-09-26 MED ORDER — ACETAMINOPHEN 10 MG/ML IV SOLN
INTRAVENOUS | Status: DC | PRN
  Administered 2017-09-26: 1000 mg via INTRAVENOUS

## 2017-09-26 MED ORDER — ACETAMINOPHEN 500 MG PO TABS
1000.0000 mg | ORAL_TABLET | Freq: Four times a day (QID) | ORAL | Status: DC
  Administered 2017-09-26 – 2017-09-27 (×3): 1000 mg via ORAL
  Filled 2017-09-26 (×3): qty 2

## 2017-09-26 MED ORDER — ROCURONIUM BROMIDE 50 MG/5ML IV SOLN
INTRAVENOUS | Status: AC
  Filled 2017-09-26: qty 1

## 2017-09-26 MED ORDER — SCOPOLAMINE 1 MG/3DAYS TD PT72
MEDICATED_PATCH | TRANSDERMAL | Status: AC
  Filled 2017-09-26: qty 1

## 2017-09-26 MED ORDER — CEFAZOLIN SODIUM 1 G IJ SOLR
INTRAMUSCULAR | Status: AC
  Filled 2017-09-26: qty 20

## 2017-09-26 MED ORDER — PROPOFOL 10 MG/ML IV BOLUS
INTRAVENOUS | Status: AC
  Filled 2017-09-26: qty 20

## 2017-09-26 MED ORDER — ACETAMINOPHEN 10 MG/ML IV SOLN
INTRAVENOUS | Status: AC
  Filled 2017-09-26: qty 100

## 2017-09-26 MED ORDER — MIDAZOLAM HCL 2 MG/2ML IJ SOLN
2.0000 mg | Freq: Once | INTRAMUSCULAR | Status: AC
  Administered 2017-09-26: 1 mg via INTRAVENOUS
  Administered 2017-09-26: 0.5 mg via INTRAVENOUS
  Administered 2017-09-26: .5 mg via INTRAVENOUS
  Administered 2017-09-26: 2 mg via INTRAVENOUS

## 2017-09-26 MED ORDER — PROCHLORPERAZINE EDISYLATE 5 MG/ML IJ SOLN: 5.0000 mg | Freq: Four times a day (QID) | INTRAMUSCULAR | Status: DC | PRN

## 2017-09-26 SURGICAL SUPPLY — 116 items
ALLODERM 8X16 MED THICK (Tissue) ×5 IMPLANT
APPLIER CLIP 9.375 MED OPEN (MISCELLANEOUS) ×5
BAG DECANTER FOR FLEXI CONT (MISCELLANEOUS) ×5 IMPLANT
BINDER BREAST LRG (GAUZE/BANDAGES/DRESSINGS) ×5 IMPLANT
BINDER BREAST XLRG (GAUZE/BANDAGES/DRESSINGS) IMPLANT
BLADE SURG 10 STRL SS (BLADE) ×5 IMPLANT
BLADE SURG 15 STRL LF DISP TIS (BLADE) ×3 IMPLANT
BLADE SURG 15 STRL SS (BLADE) ×2
BNDG COHESIVE 4X5 TAN STRL (GAUZE/BANDAGES/DRESSINGS) IMPLANT
CANISTER SUCT 3000ML PPV (MISCELLANEOUS) ×10 IMPLANT
CHLORAPREP W/TINT 26ML (MISCELLANEOUS) ×5 IMPLANT
CLIP APPLIE 9.375 MED OPEN (MISCELLANEOUS) ×3 IMPLANT
CLOSURE STERI-STRIP 1/2X4 (GAUZE/BANDAGES/DRESSINGS) ×1
CLOSURE WOUND 1/2 X4 (GAUZE/BANDAGES/DRESSINGS) ×3
CLSR STERI-STRIP ANTIMIC 1/2X4 (GAUZE/BANDAGES/DRESSINGS) ×4 IMPLANT
CONT SPEC 4OZ CLIKSEAL STRL BL (MISCELLANEOUS) ×10 IMPLANT
COVER MAYO STAND STRL (DRAPES) IMPLANT
COVER PROBE W GEL 5X96 (DRAPES) ×5 IMPLANT
COVER SURGICAL LIGHT HANDLE (MISCELLANEOUS) ×10 IMPLANT
DERMABOND ADVANCED (GAUZE/BANDAGES/DRESSINGS) ×4
DERMABOND ADVANCED .7 DNX12 (GAUZE/BANDAGES/DRESSINGS) ×6 IMPLANT
DRAIN CHANNEL 15F RND FF W/TCR (WOUND CARE) ×5 IMPLANT
DRAIN CHANNEL 19F RND (DRAIN) ×15 IMPLANT
DRAPE HALF SHEET 40X57 (DRAPES) ×10 IMPLANT
DRAPE INCISE 23X17 IOBAN STRL (DRAPES)
DRAPE INCISE IOBAN 23X17 STRL (DRAPES) IMPLANT
DRAPE INCISE IOBAN 66X45 STRL (DRAPES) IMPLANT
DRAPE INCISE IOBAN 85X60 (DRAPES) ×5 IMPLANT
DRAPE LAPAROSCOPIC ABDOMINAL (DRAPES) ×5 IMPLANT
DRAPE ORTHO SPLIT 77X108 STRL (DRAPES) ×8
DRAPE SURG ORHT 6 SPLT 77X108 (DRAPES) ×12 IMPLANT
DRAPE WARM FLUID 44X44 (DRAPE) ×5 IMPLANT
DRSG MEPILEX BORDER 4X8 (GAUZE/BANDAGES/DRESSINGS) ×5 IMPLANT
DRSG OPSITE 6X11 MED (GAUZE/BANDAGES/DRESSINGS) ×20 IMPLANT
DRSG PAD ABDOMINAL 8X10 ST (GAUZE/BANDAGES/DRESSINGS) ×10 IMPLANT
DRSG TEGADERM 2-3/8X2-3/4 SM (GAUZE/BANDAGES/DRESSINGS) ×5 IMPLANT
DRSG TEGADERM 4X4.75 (GAUZE/BANDAGES/DRESSINGS) ×20 IMPLANT
ELECT BLADE 4.0 EZ CLEAN MEGAD (MISCELLANEOUS) ×10
ELECT BLADE 6.5 EXT (BLADE) ×10 IMPLANT
ELECT CAUTERY BLADE 6.4 (BLADE) ×10 IMPLANT
ELECT COATED BLADE 2.86 ST (ELECTRODE) ×15 IMPLANT
ELECT REM PT RETURN 9FT ADLT (ELECTROSURGICAL) ×10
ELECTRODE BLDE 4.0 EZ CLN MEGD (MISCELLANEOUS) ×6 IMPLANT
ELECTRODE REM PT RTRN 9FT ADLT (ELECTROSURGICAL) ×6 IMPLANT
EVACUATOR SILICONE 100CC (DRAIN) ×20 IMPLANT
EXPANDER TISSUE MX 400CC (Breast) ×6 IMPLANT
GAUZE SPONGE 4X4 12PLY STRL (GAUZE/BANDAGES/DRESSINGS) ×10 IMPLANT
GAUZE XEROFORM 5X9 LF (GAUZE/BANDAGES/DRESSINGS) ×5 IMPLANT
GLOVE BIO SURGEON STRL SZ 6 (GLOVE) ×15 IMPLANT
GLOVE BIO SURGEON STRL SZ7 (GLOVE) ×10 IMPLANT
GLOVE BIOGEL PI IND STRL 7.5 (GLOVE) ×3 IMPLANT
GLOVE BIOGEL PI INDICATOR 7.5 (GLOVE) ×2
GOWN STRL REUS W/ TWL LRG LVL3 (GOWN DISPOSABLE) ×21 IMPLANT
GOWN STRL REUS W/ TWL XL LVL3 (GOWN DISPOSABLE) ×6 IMPLANT
GOWN STRL REUS W/TWL LRG LVL3 (GOWN DISPOSABLE) ×14
GOWN STRL REUS W/TWL XL LVL3 (GOWN DISPOSABLE) ×4
KIT BASIN OR (CUSTOM PROCEDURE TRAY) ×10 IMPLANT
KIT ROOM TURNOVER OR (KITS) ×10 IMPLANT
LIGHT WAVEGUIDE WIDE FLAT (MISCELLANEOUS) ×5 IMPLANT
MARKER SKIN DUAL TIP RULER LAB (MISCELLANEOUS) ×10 IMPLANT
NEEDLE 18GX1X1/2 (RX/OR ONLY) (NEEDLE) IMPLANT
NEEDLE 22X1 1/2 (OR ONLY) (NEEDLE) ×5 IMPLANT
NEEDLE FILTER BLUNT 18X 1/2SAF (NEEDLE)
NEEDLE FILTER BLUNT 18X1 1/2 (NEEDLE) IMPLANT
NEEDLE HYPO 25GX1X1/2 BEV (NEEDLE) ×5 IMPLANT
NS IRRIG 1000ML POUR BTL (IV SOLUTION) ×15 IMPLANT
PACK GENERAL/GYN (CUSTOM PROCEDURE TRAY) ×10 IMPLANT
PAD ARMBOARD 7.5X6 YLW CONV (MISCELLANEOUS) ×20 IMPLANT
PEN SKIN MARKING BROAD (MISCELLANEOUS) ×5 IMPLANT
PENCIL BUTTON HOLSTER BLD 10FT (ELECTRODE) IMPLANT
PIN SAFETY STERILE (MISCELLANEOUS) ×10 IMPLANT
PUNCH BIOPSY 4MM (MISCELLANEOUS) ×5
PUNCH BIOPSY DISP 4 (MISCELLANEOUS) ×3 IMPLANT
SET ASEPTIC TRANSFER (MISCELLANEOUS) ×5 IMPLANT
SET COLLECT BLD 21X3/4 12 PB (MISCELLANEOUS) IMPLANT
SOL PREP POV-IOD 4OZ 10% (MISCELLANEOUS) ×5 IMPLANT
SPECIMEN JAR X LARGE (MISCELLANEOUS) ×5 IMPLANT
SPONGE GAUZE 4X4 12PLY STER LF (GAUZE/BANDAGES/DRESSINGS) ×10 IMPLANT
SPONGE LAP 18X18 X RAY DECT (DISPOSABLE) IMPLANT
STAPLER VISISTAT 35W (STAPLE) ×10 IMPLANT
STOCKINETTE IMPERVIOUS 9X36 MD (GAUZE/BANDAGES/DRESSINGS) IMPLANT
STRIP CLOSURE SKIN 1/2X4 (GAUZE/BANDAGES/DRESSINGS) ×12 IMPLANT
SUT CHROMIC 4 0 PS 2 18 (SUTURE) IMPLANT
SUT ETHILON 2 0 FS 18 (SUTURE) ×30 IMPLANT
SUT MNCRL 3 0 RB1 (SUTURE) IMPLANT
SUT MNCRL AB 4-0 PS2 18 (SUTURE) ×20 IMPLANT
SUT MONOCRYL 3 0 RB1 (SUTURE)
SUT PDS AB 2-0 CT1 27 (SUTURE) ×10 IMPLANT
SUT PDS AB 2-0 CT2 27 (SUTURE) ×20 IMPLANT
SUT SILK 2 0 SH (SUTURE) ×10 IMPLANT
SUT VIC AB 0 CT1 27 (SUTURE) ×4
SUT VIC AB 0 CT1 27XBRD ANBCTR (SUTURE) ×6 IMPLANT
SUT VIC AB 3-0 54X BRD REEL (SUTURE) ×3 IMPLANT
SUT VIC AB 3-0 BRD 54 (SUTURE) ×2
SUT VIC AB 3-0 PS2 18 (SUTURE) ×8
SUT VIC AB 3-0 PS2 18XBRD (SUTURE) ×12 IMPLANT
SUT VIC AB 3-0 SH 18 (SUTURE) ×5 IMPLANT
SUT VIC AB 3-0 SH 27 (SUTURE)
SUT VIC AB 3-0 SH 27X BRD (SUTURE) IMPLANT
SUT VIC AB 3-0 SH 8-18 (SUTURE) ×15 IMPLANT
SUT VICRYL 3 0 (SUTURE) IMPLANT
SUT VICRYL 4-0 PS2 18IN ABS (SUTURE) ×5 IMPLANT
SUT VLOC 180 0 24IN GS25 (SUTURE) IMPLANT
SYR 50ML SLIP (SYRINGE) IMPLANT
SYR BULB IRRIGATION 50ML (SYRINGE) ×5 IMPLANT
SYR CONTROL 10ML LL (SYRINGE) ×5 IMPLANT
TAPE STRIPS DRAPE STRL (GAUZE/BANDAGES/DRESSINGS) ×5 IMPLANT
TISSUE ALLDRM 8X16 MED THICK (Tissue) ×3 IMPLANT
TISSUE EXPANDER MX 400CC (Breast) ×10 IMPLANT
TOWEL OR 17X24 6PK STRL BLUE (TOWEL DISPOSABLE) ×10 IMPLANT
TOWEL OR 17X26 10 PK STRL BLUE (TOWEL DISPOSABLE) ×10 IMPLANT
TRAY FOLEY W/METER SILVER 14FR (SET/KITS/TRAYS/PACK) ×5 IMPLANT
TRAY FOLEY W/METER SILVER 16FR (SET/KITS/TRAYS/PACK) IMPLANT
TUBE CONNECTING 12'X1/4 (SUCTIONS) ×1
TUBE CONNECTING 12X1/4 (SUCTIONS) ×4 IMPLANT
YANKAUER SUCT BULB TIP NO VENT (SUCTIONS) ×5 IMPLANT

## 2017-09-26 NOTE — Transfer of Care (Signed)
Immediate Anesthesia Transfer of Care Note  Patient: Nancy Austin  Procedure(s) Performed: LEFT NIPPLE SPARING MASTECTOMY, RIGHT PROPHYLACTIC NIPPLE SPARING MASTECTOMY (Bilateral Breast) LATISSIMUS FLAP TO LEFT BREAST (Left Breast) RIGHT BREAST RECONSTRUCTION WITH PLACEMENT OF TISSUE EXPANDER AND FLEX HD (ACELLULAR HYDRATED DERMIS) (Right Breast)  Patient Location: PACU  Anesthesia Type:General  Level of Consciousness: awake, alert  and oriented  Airway & Oxygen Therapy: Patient connected to face mask oxygen  Post-op Assessment: Post -op Vital signs reviewed and stable  Post vital signs: stable  Last Vitals:  Vitals:   09/26/17 0835 09/26/17 1410  BP:    Pulse: 73   Resp: 18   Temp:  (P) 36.8 C  SpO2: 100%     Last Pain:  Vitals:   09/26/17 0752  TempSrc: Oral      Patients Stated Pain Goal: 4 (85/63/14 9702)  Complications: No apparent anesthesia complications

## 2017-09-26 NOTE — Anesthesia Procedure Notes (Signed)
Anesthesia Regional Block: Pectoralis block   Pre-Anesthetic Checklist: ,, timeout performed, Correct Patient, Correct Site, Correct Laterality, Correct Procedure, Correct Position, site marked, Risks and benefits discussed,  Surgical consent,  Pre-op evaluation,  At surgeon's request and post-op pain management  Laterality: Left  Prep: chloraprep       Needles:  Injection technique: Single-shot  Needle Type: Echogenic Needle     Needle Length: 9cm  Needle Gauge: 21     Additional Needles:   Procedures:,,,, ultrasound used (permanent image in chart),,,,  Narrative:  Start time: 09/26/2017 8:10 AM End time: 09/26/2017 8:20 AM Injection made incrementally with aspirations every 5 mL.  Performed by: Personally  Anesthesiologist: Suella Broad D  Additional Notes: Patient tolerated the procedure well. Local anesthetic introduced in an incremental fashion under minimal resistance after negative aspirations. No paresthesias were elicited. After completion of the procedure, no acute issues were identified and patient continued to be monitored by RN.

## 2017-09-26 NOTE — Interval H&P Note (Signed)
History and Physical Interval Note:  09/26/2017 8:25 AM  Rosalene Billings Maish  has presented today for surgery, with the diagnosis of RECURRENT LEFT BREAST CANCER  The various methods of treatment have been discussed with the patient and family. After consideration of risks, benefits and other options for treatment, the patient has consented to  Procedure(s): LEFT NIPPLE SPARING MASTECTOMY, RIGHT PROPHYLACTIC NIPPLE SPARING MASTECTOMY (Bilateral) LATISSIMUS FLAP TO LEFT BREAST (Left) RIGHT BREAST RECONSTRUCTION WITH PLACEMENT OF TISSUE EXPANDER AND FLEX HD (ACELLULAR HYDRATED DERMIS) (Right) as a surgical intervention .  The patient's history has been reviewed, patient examined, no change in status, stable for surgery.  I have reviewed the patient's chart and labs.  Questions were answered to the patient's satisfaction.     Cesiah Westley

## 2017-09-26 NOTE — Anesthesia Preprocedure Evaluation (Addendum)
Anesthesia Evaluation  Patient identified by MRN, date of birth, ID band Patient awake    Reviewed: Allergy & Precautions, NPO status , Patient's Chart, lab work & pertinent test results  Airway Mallampati: I  TM Distance: >3 FB Neck ROM: Full    Dental  (+) Teeth Intact, Dental Advisory Given   Pulmonary neg pulmonary ROS,    breath sounds clear to auscultation       Cardiovascular + Peripheral Vascular Disease   Rhythm:Regular Rate:Normal     Neuro/Psych  Neuromuscular disease    GI/Hepatic negative GI ROS, Neg liver ROS,   Endo/Other  negative endocrine ROS  Renal/GU negative Renal ROS     Musculoskeletal negative musculoskeletal ROS (+)   Abdominal   Peds  Hematology negative hematology ROS (+)   Anesthesia Other Findings Day of surgery medications reviewed with the patient.  Reproductive/Obstetrics                            Lab Results  Component Value Date   WBC 5.1 09/21/2017   HGB 12.9 09/21/2017   HCT 39.9 09/21/2017   MCV 90.5 09/21/2017   PLT 217 09/21/2017   Lab Results  Component Value Date   CREATININE 0.96 09/21/2017   BUN 12 09/21/2017   NA 140 09/21/2017   K 3.8 09/21/2017   CL 106 09/21/2017   CO2 28 09/21/2017   No results found for: INR, PROTIME  Echo: - Left ventricle: The cavity size was normal. Wall thickness was   normal. Systolic function was normal. The estimated ejection   fraction was in the range of 55% to 60%. Wall motion was normal;   there were no regional wall motion abnormalities. Left   ventricular diastolic function parameters were normal. Lateral s&'   10.7 cm/sec. GLS -22.9%. - Aortic valve: There was no stenosis. - Mitral valve: There was no significant regurgitation. - Left atrium: The atrium was mildly dilated. - Right ventricle: The cavity size was normal. Systolic function   was normal. - Right atrium: The atrium was mildly  dilated. - Tricuspid valve: Peak RV-RA gradient (S): 17 mm Hg. - Pulmonary arteries: PA peak pressure: 25 mm Hg (S). - Systemic veins: IVC measured 2.4 cm with < 50% respirophasic   variation, suggesting RA pressure 8 mmHg.  Anesthesia Physical Anesthesia Plan  ASA: II  Anesthesia Plan: General   Post-op Pain Management:  Regional for Post-op pain   Induction: Intravenous  PONV Risk Score and Plan: 4 or greater and Ondansetron, Dexamethasone, Midazolam, Scopolamine patch - Pre-op and Treatment may vary due to age or medical condition  Airway Management Planned: Oral ETT  Additional Equipment:   Intra-op Plan:   Post-operative Plan: Extubation in OR  Informed Consent: I have reviewed the patients History and Physical, chart, labs and discussed the procedure including the risks, benefits and alternatives for the proposed anesthesia with the patient or authorized representative who has indicated his/her understanding and acceptance.   Dental advisory given  Plan Discussed with: CRNA  Anesthesia Plan Comments:         Anesthesia Quick Evaluation

## 2017-09-26 NOTE — Anesthesia Postprocedure Evaluation (Signed)
Anesthesia Post Note  Patient: TAMAIRA CIRIELLO  Procedure(s) Performed: LEFT NIPPLE SPARING MASTECTOMY, RIGHT PROPHYLACTIC NIPPLE SPARING MASTECTOMY (Bilateral Breast) LATISSIMUS FLAP TO LEFT BREAST (Left Breast) RIGHT BREAST RECONSTRUCTION WITH PLACEMENT OF TISSUE EXPANDER AND FLEX HD (ACELLULAR HYDRATED DERMIS) (Right Breast)     Patient location during evaluation: PACU Anesthesia Type: General and Regional Level of consciousness: awake and alert Pain management: pain level controlled Vital Signs Assessment: post-procedure vital signs reviewed and stable Respiratory status: spontaneous breathing, nonlabored ventilation, respiratory function stable and patient connected to nasal cannula oxygen Cardiovascular status: blood pressure returned to baseline and stable Postop Assessment: no apparent nausea or vomiting Anesthetic complications: no    Last Vitals:  Vitals:   09/26/17 1515 09/26/17 1532  BP:  110/74  Pulse:  62  Resp:    Temp: (!) 36.3 C 37.5 C  SpO2:  97%    Last Pain:  Vitals:   09/26/17 1532  TempSrc: Oral  PainSc:                  Effie Berkshire

## 2017-09-26 NOTE — Op Note (Signed)
Operative Note   DATE OF OPERATION: 10.15.18  LOCATION: [facility]-in[facility]  SURGICAL DIVISION: Plastic Surgery  PREOPERATIVE DIAGNOSES: 1. Left breast DCIS 2. History left breast cancer 3. History therapeutic radiation.   POSTOPERATIVE DIAGNOSES:  same  PROCEDURE:  1. Bilateral breast reconstruction with tissue expanders 2. Acellular dermis for breast reconstruction right chest 100 cm2. 3. Left latissimus flap for breast reconstruction  SURGEON: Irene Limbo MD MBA  ASSISTANTCaryl Asp RNFA  ANESTHESIA:  General.   EBL: 250 ml for entire procedure  COMPLICATIONS: None immediate.   INDICATIONS FOR PROCEDURE:  The patient, Nancy Austin, is a 41 y.o. female born on [birth year], is here for immediate expander based reconstruction following bilateral nipple sparing mastectomies. She has a history of prior left breast cancer, post lumpectomy and adjuvant radiation. She now presents with DCIS of left breast and mastectomy planned. Given prior radiation, I have recommended latissimus flap.    FINDINGS: Natrelle 133MX-12-T tissue expanders placed bilateral, initial fill volume 80 ml. RIGHT SN [id number] LEFT SN [id number]  DESCRIPTION OF PROCEDURE:  The patient's operative site was marked with the patient in the preoperative area including sternal notch, chest midline, anterior axillary lines. The patient was taken to the operating room. SCDs were placed and IV antibiotics were given. Foley catheter placed.The patient's operative site was prepped and draped in a sterile fashion. A time out was performed and all information was confirmed to be correct. Following completion of mastectomies, reconstruction began on right side. Lower border pectoralis muscle identified and divided, taking care to preserve sternal and medial insertions. Submuscular dissection then completed to accommodate the expander. Acellular dermis prepared and perforated. This was inset to chest wall with running 3-0  vicryl. The cavity was irrigated with Ancef, gentamicin, bacitracin solution and hemostasis ensured. 42 Fr JP placed in cavity and secured with 2-0 nylon. The cavity was then irrigated with Betadine. Expander prepared and placed in submuscular cavity. This was secured to chest wall with 3-0 vicryl. Acellular dermis was then wrapped over lower border expander and secured to lateral pectoralis muscle with 3-0 vicryl. Laterally the mastectomy flap over posterior axillary line was advanced anteriorly and the subcutaneous tissue and superficial fascia was secured to pectoralis muscle and acellular dermis with 0-vicryl. Skin closure completedwith 3-0 vicryl in fascial layer and 4-0 vicryl in dermis. Skin closure completed with 4-0 monocryl subcuticular and tissue adhesive. I then directed my attention to left chest. Ioban was placed over chest and patient was then placed in right lateral position and prepped and draped.   An additional time out was performed and all information was confirmed to be correct. Incision made surrounding skin paddle designed over left back. Skin and superficial fascia elevated off surface of latissimus muscle and subcutaneous tunnel dissected to axilla joining anterior breast cavity. Patient's prior left axillary scar was utilized to aid with dissection. Anterior border of latissimus identified and elevated. Muscle divided inferiorly at superior iliac spine. Submuscular dissection completed toward midline back and toward origin. Thoracodorsal nerve was divided. Flap rotated into anterior chest cavity. Back irrigated and hemostasis ensured. Local anesthetic infiltrated in back. 15 Fr drain placed and secured with 2-0 nylon. 2-0 PDS used to placed quilting sutures from elevated skin flaps to chest wall. Incision closed with 0 V lock suture in superficial fascia and dermis. Skin closure completed with 4-0 monocryl subcuticular and tissue glue applied.   The flap muscle oriented to defect.  The muscle was secured to pectoralis and serratus muscle and  inferiorly to abdominal wall fascia with interrupted 2-0 PDS. 14 Fr JP drain placed in breast cavity and secured to chest with 2-0 nylon. The expander was prepared and placed beneath latissimus muscle. The remainder of latissimus inset inferiorly to abdominal wall fascia. Skin paddle discarded. Mastectomy flap redraped and closured completed ed with 3-0 vicryl in fascial layer and 4-0 vicryl in dermis. Skin closure completed with 4-0 monocryl subcuticular and tissue adhesive. The patient was returned to supine position and bilateral ports were accessed and filled to 80 ml bilateral. The skin flaps were redraped so that NAC was symmetric from the sternal notch and midline. Transparent, adherent dressings applied. Dry dressing and breast binder applied.  The patient was allowed to wake from anesthesia, extubated and taken to the recovery room in satisfactory condition.   SPECIMENS: none  DRAINS: 19 Fr JP in right and left breast reconstruction, 15 Fr JP in left back  Irene Limbo, MD [facility] Plastic & Reconstructive Surgery [phone], pin [phone]

## 2017-09-26 NOTE — Anesthesia Procedure Notes (Addendum)
Procedure Name: Intubation Date/Time: 09/26/2017 8:58 AM Performed by: Lavell Luster Pre-anesthesia Checklist: Patient identified, Emergency Drugs available, Suction available, Patient being monitored and Timeout performed Patient Re-evaluated:Patient Re-evaluated prior to induction Oxygen Delivery Method: Circle system utilized Preoxygenation: Pre-oxygenation with 100% oxygen Induction Type: IV induction Ventilation: Mask ventilation without difficulty Laryngoscope Size: Mac and 3 Grade View: Grade I Tube type: Oral Tube size: 7.0 mm Number of attempts: 1 Airway Equipment and Method: LTA kit utilized and Stylet Placement Confirmation: ETT inserted through vocal cords under direct vision,  positive ETCO2 and breath sounds checked- equal and bilateral Secured at: 21 cm Tube secured with: Tape Dental Injury: Teeth and Oropharynx as per pre-operative assessment  Comments: Easy atraumatic induction and intubation with MAC 3 blade, Grade I view noted,  LTA kit used.  Henderson Cloud, CRNA

## 2017-09-26 NOTE — Interval H&P Note (Signed)
History and Physical Interval Note:  09/26/2017 7:54 AM  Nancy Austin  has presented today for surgery, with the diagnosis of RECURRENT LEFT BREAST CANCER  The various methods of treatment have been discussed with the patient and family. After consideration of risks, benefits and other options for treatment, the patient has consented to  Bilateral breast reconstruction with tissue expanders, left latissimus flap, right  Acellular dermis for breast reconstruction as a surgical intervention .  The patient's history has been reviewed, patient examined, no change in status, stable for surgery.  I have reviewed the patient's chart and labs.  Questions were answered to the patient's satisfaction.     [name]

## 2017-09-26 NOTE — H&P (Signed)
Nancy Austin is an 41 y.o. female.   Chief Complaint: recurrent left breast cancer HPI: 66 yof treated in 2015 for left breast triple positive invasive cancer with chemotherapy, lump/sn and radiotherapy. She completed antiher2 therapy.  She then had mm that showed new calcifications near the lumpectomy site. Was unable to be biopsied by radiology. I did seed guided excision and this is hormone receptor negative dcis. Margins were clear on that specimen.    Past Medical History:  Diagnosis Date  . Anemia   . Benign skin lesion of forehead 10/2015  . Breast cancer ([place])   . Cancer ([place])   . History of breast cancer 09/2014   left  . History of chemotherapy    finished 02/10/2015  . History of radiation therapy 04/16/2015 - 06/02/2015  . Personal history of radiation therapy     Past Surgical History:  Procedure Laterality Date  . BREAST LUMPECTOMY    . LAPAROSCOPIC SALPINGO OOPHERECTOMY Bilateral 11/12/2015   Procedure: LAPAROSCOPIC BILATERAL SALPINGO OOPHORECTOMY;  Surgeon: Paula Compton, MD;  Location: [facility];  Service: Gynecology;  Laterality: Bilateral;  . LESION REMOVAL Left 10/22/2015   Procedure: EXCISION OF FOREHEAD LESION 1X1CM;  Surgeon: Rolm Bookbinder, MD;  Location: [place];  Service: General;  Laterality: Left;  . PORT-A-CATH REMOVAL N/A 10/22/2015   Procedure: REMOVAL PORT-A-CATH;  Surgeon: Rolm Bookbinder, MD;  Location: [place];  Service: General;  Laterality: N/A;  . PORTACATH PLACEMENT N/A 10/15/2014   Procedure: INSERTION PORT-A-CATH;  Surgeon: Rolm Bookbinder, MD;  Location: [facility];  Service: General;  Laterality: N/A;  . RADIOACTIVE SEED GUIDED EXCISIONAL BREAST BIOPSY Left 08/17/2017   Procedure: LEFT RADIOACTIVE SEED GUIDED EXCISIONAL BREAST BIOPSY ERAS PATHWAY;  Surgeon: Rolm Bookbinder, MD;  Location: [place];  Service: General;  Laterality: Left;  . RADIOACTIVE SEED GUIDED MASTECTOMY WITH AXILLARY  SENTINEL LYMPH NODE BIOPSY Left 03/06/2015   Procedure: RADIOACTIVE SEED GUIDED PARTIAL MASTECTOMY WITH AXILLARY SENTINEL LYMPH NODE BIOPSY;  Surgeon: Rolm Bookbinder, MD;  Location: [place];  Service: General;  Laterality: Left;  . TONSILLECTOMY  1996    Family History  Problem Relation Age of Onset  . Skin cancer Father   . Hyperlipidemia Father   . Prostate cancer Father   . Prostate cancer Maternal Uncle 15       currently 57  . Multiple myeloma Paternal Grandmother 73       Deceased 29  . Cancer Paternal Grandmother        bone  . Prostate cancer Paternal Uncle   . Lung disease Maternal Grandmother   . AAA (abdominal aortic aneurysm) Maternal Grandmother   . Stroke Maternal Grandfather    Social History:  reports that she has never smoked. She has never used smokeless tobacco. She reports that she drinks alcohol. She reports that she does not use drugs.  Allergies:  Allergies  Allergen Reactions  . Chlorhexidine Itching and Rash  . Morphine And Related Nausea And Vomiting  . Tegaderm Ag Mesh [Silver] Rash    Reports that the larger tegaderm causes rashes    Medications Prior to Admission  Medication Sig Dispense Refill  . diphenhydrAMINE (BENADRYL) 25 MG tablet Take 25 mg by mouth at bedtime as needed for sleep. Reported on 06/07/2016    . gabapentin (NEURONTIN) 300 MG capsule Take 1 capsule (300 mg total) by mouth at bedtime. 90 capsule 3  . meloxicam (MOBIC) 15 MG tablet TAKE 1 TABLET BY  MOUTH EVERY DAY (Patient taking differently: Take 15 mg by mouth at bedtime) 30 tablet 1  . tamoxifen (NOLVADEX) 20 MG tablet Take 1 tablet (20 mg total) by mouth daily. 90 tablet 3    No results found for this or any previous visit (from the past 48 hour(s)). No results found.  Review of Systems  All other systems reviewed and are negative.   Blood pressure 135/84, pulse (!) 56, temperature 97.9 F (36.6 C), temperature source Oral, resp. rate 18, weight 61.2  kg (135 lb), SpO2 100 %. Physical Exam  Vitals reviewed. Constitutional: She is oriented to person, place, and time. She appears well-developed and well-nourished.  Neck: Neck supple.  Cardiovascular: Normal rate, regular rhythm and normal heart sounds.   Respiratory: Effort normal and breath sounds normal. Right breast exhibits no mass and no nipple discharge. Left breast exhibits no mass and no nipple discharge.    GI: Soft.  Lymphadenopathy:    She has no cervical adenopathy.  Neurological: She is alert and oriented to person, place, and time.     Assessment/Plan Recurrent left breast cancer  Bilateral nsm with reconstruction  Rolm Bookbinder, MD 09/26/2017, 8:23 AM

## 2017-09-26 NOTE — Anesthesia Procedure Notes (Signed)
Anesthesia Regional Block: Pectoralis block   Pre-Anesthetic Checklist: ,, timeout performed, Correct Patient, Correct Site, Correct Laterality, Correct Procedure, Correct Position, site marked, Risks and benefits discussed,  Surgical consent,  Pre-op evaluation,  At surgeon's request and post-op pain management  Laterality: Right  Prep: chloraprep       Needles:  Injection technique: Single-shot  Needle Type: Echogenic Needle     Needle Length: 9cm  Needle Gauge: 21     Additional Needles:   Procedures:,,,, ultrasound used (permanent image in chart),,,,  Narrative:  Start time: 09/26/2017 8:20 AM End time: 09/26/2017 8:30 AM Injection made incrementally with aspirations every 5 mL.  Performed by: Personally  Anesthesiologist: Suella Broad D  Additional Notes: Patient tolerated the procedure well. Local anesthetic introduced in an incremental fashion under minimal resistance after negative aspirations. No paresthesias were elicited. After completion of the procedure, no acute issues were identified and patient continued to be monitored by RN.

## 2017-09-26 NOTE — Op Note (Signed)
Preoperative diagnosis: prior left breast cancer treated with lump/sn/chemo/radiotherapy now with left breast dcis hormone receptor negative. Postoperative diagnosis: Same as above Procedure: #1 left nipple sparing mastectomy #2 right prophylactic nipple sparing mastectomy Surgeon: Dr. Serita Grammes Asst.:Dr Irene Limbo Estimated blood loss: 75 mL Drains: Per plastic surgery a completion Specimens: #1right prophylactic nipple sparing mastectomy short superior, long lateral, double marks anterior nipple areola #2 right nipple margin #3 left nipple sparing mastectomy marked short superior, long lateral, double deep #4 left nipple margin Concretions: None Description case turned over to plastic surgery for reconstruction  Indications: This is a 41 year old female who in 2015 had a triple positive invasive breast cancer. This was treated with lumpectomy and sentinel node. She also underwent chemotherapy, radiation therapy, and is still on antiestrogen therapy. She had a mammogram that showed new calcifications on the left side. Due to her habitus she was unable to undergo core biopsy. I did a excisional biopsy and this is a hormone receptor negative DCIS. We discussed all of her options and she has elected to undergo bilateral nipple sparing mastectomy with reconstruction.  Procedure: After informed consent was obtained the patient stinging preoperative she underwent bilateral pectoral blocks. She was given Ancef. She had SCDs in place. She was then prepped and draped in the standard sterile surgical fashion. She was prepped with DuraPrep given her prior issue with chlorhexidine. Surgical timeout was then performed.  I did the prophylactic side first. I made an incision that began a centimeters from the xiphoid and extended 9 cm laterally and inframammary fold. I then located the pectoralis muscle. I removed the breast tissue and the pectoralis fascia from the muscle to the parasternal area,  clavicle, latissimus. Once I done this I then made the anterior flap with a combination of cautery and scissor dissection. The breast was then removed in its entirety and marked as above. I removed several other small areas of breast tissue from the flaps. I then inverted the nipple. I used a 15 blade to remove all of the ductal tissue in the nipple. This was passed off as a separate specimen. Then obtained hemostasis on this side. Plastic surgery also worked on this again a little bit later. I packed this and then moved to the other side.  I made a inframammary incision on the left side to start at 8 cm and the xiphoid and extended 10 cm and inframammary fold. I then created the posterior plane. This was little bit more difficult due to prior radiation on this side. I removed the breast and the pectoralis fascia from the muscle. I then created the anterior flap to the margins as on the other side. The breast tissue was then all removed. I debrided some additional breast tissue from the flap. I inverted the left nipple and use a 15 blade to remove this and sent it as a separate specimen as well. Hemostasis observed. I packed this side. The case was turned turned over to plastic surgery completion.

## 2017-09-27 ENCOUNTER — Encounter (HOSPITAL_COMMUNITY): Payer: Self-pay | Admitting: General Surgery

## 2017-09-27 DIAGNOSIS — Z853 Personal history of malignant neoplasm of breast: Secondary | ICD-10-CM | POA: Diagnosis not present

## 2017-09-27 DIAGNOSIS — Z79899 Other long term (current) drug therapy: Secondary | ICD-10-CM | POA: Diagnosis not present

## 2017-09-27 DIAGNOSIS — D0512 Intraductal carcinoma in situ of left breast: Secondary | ICD-10-CM | POA: Diagnosis not present

## 2017-09-27 DIAGNOSIS — Z888 Allergy status to other drugs, medicaments and biological substances status: Secondary | ICD-10-CM | POA: Diagnosis not present

## 2017-09-27 DIAGNOSIS — Z4001 Encounter for prophylactic removal of breast: Secondary | ICD-10-CM | POA: Diagnosis not present

## 2017-09-27 LAB — BASIC METABOLIC PANEL
Anion gap: 5 (ref 5–15)
BUN: 5 mg/dL — ABNORMAL LOW (ref 6–20)
CHLORIDE: 105 mmol/L (ref 101–111)
CO2: 29 mmol/L (ref 22–32)
Calcium: 8.6 mg/dL — ABNORMAL LOW (ref 8.9–10.3)
Creatinine, Ser: 0.69 mg/dL (ref 0.44–1.00)
GFR calc non Af Amer: >60 mL/min (ref >60)
Glucose, Bld: 123 mg/dL — ABNORMAL HIGH (ref 65–99)
POTASSIUM: 4.1 mmol/L (ref 3.5–5.1)
SODIUM: 139 mmol/L (ref 135–145)

## 2017-09-27 LAB — CBC
HEMATOCRIT: 31.2 % — AB (ref 36.0–46.0)
Hemoglobin: 10.1 g/dL — ABNORMAL LOW (ref 12.0–15.0)
MCH: 29.4 pg (ref 26.0–34.0)
MCHC: 32.4 g/dL (ref 30.0–36.0)
MCV: 91 fL (ref 78.0–100.0)
Platelets: 186 10*3/uL (ref 150–400)
RBC: 3.43 MIL/uL — AB (ref 3.87–5.11)
RDW: 13.4 % (ref 11.5–15.5)
WBC: 8.4 10*3/uL (ref 4.0–10.5)

## 2017-09-27 MED ORDER — METHOCARBAMOL 500 MG PO TABS: 500.0000 mg | ORAL_TABLET | Freq: Three times a day (TID) | ORAL | 0 refills | Status: DC | PRN

## 2017-09-27 MED ORDER — OXYCODONE HCL 5 MG PO TABS: 5.0000 mg | ORAL_TABLET | ORAL | 0 refills | Status: DC | PRN

## 2017-09-27 MED ORDER — SULFAMETHOXAZOLE-TRIMETHOPRIM 800-160 MG PO TABS: 1.0000 | ORAL_TABLET | Freq: Two times a day (BID) | ORAL | 0 refills | Status: DC

## 2017-09-27 NOTE — Progress Notes (Signed)
Pt for discharge today going home husband at the bedside, health teachings, instructed how to empty the JP drain (3) and record it, next appointment, discontinue the peripheral IV line, given all personal belongings, given pain meds and nausea meds, wound dressing dry and intact.

## 2017-09-27 NOTE — Progress Notes (Signed)
Pt decline flu shots she already had one.

## 2017-09-27 NOTE — Progress Notes (Signed)
Patient ID: Nancy Austin, female   DOB: Mar 20, 1976, 41 y.o.   MRN: [id number] Voiding pain controlled fairly well, tol diet, ambulating to bathroom, hopefully home later, path pending

## 2017-09-27 NOTE — Discharge Summary (Signed)
Physician Discharge Summary  Patient ID: Nancy Austin MRN: [id number] DOB/AGE: 41/21/1977 41 y.o.  Admit date: 09/26/2017 Discharge date: 09/27/2017  Admission Diagnoses: DCIS left breast  Discharge Diagnoses:  Active Problems:   Ductal carcinoma in situ (DCIS) of left breast  Discharged Condition: stable  Hospital Course: Postoperatively patient did well, able to void and tolerate diet on POD#0. She was ambulatory in room with controlled pain.  Treatments: surgery: bilateral nipple sparing mastectomies, bilateral breast reconstruction with tissue expanders, left latissimus flap, right acellular dermis for breast reconstruction 10.15.18  Discharge Exam: Blood pressure 107/64, pulse 62, temperature 98.8 F (37.1 C), temperature source Oral, resp. rate 18, weight 61.2 kg (135 lb), SpO2 99 %. Incision/Wound: chest soft flat, Op site in place, back flat incision dry, JPs serosanguinous  Disposition: 01-Home or Self Care  Discharge Instructions    Call MD for:  redness, tenderness, or signs of infection (pain, swelling, bleeding, redness, odor or green/yellow discharge around incision site)    Complete by:  As directed    Call MD for:  temperature >100.5    Complete by:  As directed    Discharge instructions    Complete by:  As directed    Ok to remove dressings and shower am <T+2>. Soap and water ok, pat incisions dry. No creams or ointments over incisions. Do not let drains dangle in shower, attach to lanyard or similar.Strip and record drains twice daily and bring log to clinic visit.  Breast binder or soft compression bra all other times.  Ok to raise arms above shoulders for bathing and dressing.  No house yard work or exercise until cleared by MD.   Merril Abbe as needed for constipation.   Driving Restrictions    Complete by:  As directed    No driving for 2 weeks, then no driving if taking narcotics   Lifting restrictions    Complete by:  As directed    No lifting > 5  lbs until cleared by MD   Resume previous diet    Complete by:  As directed      Allergies as of 09/27/2017      Reactions   Chlorhexidine Itching, Rash   Morphine And Related Nausea And Vomiting   Tegaderm Ag Mesh [silver] Rash   Reports that the larger tegaderm causes rashes      Medication List    TAKE these medications   diphenhydrAMINE 25 MG tablet Commonly known as:  BENADRYL Take 25 mg by mouth at bedtime as needed for sleep. Reported on 06/07/2016   gabapentin 300 MG capsule Commonly known as:  NEURONTIN Take 1 capsule (300 mg total) by mouth at bedtime.   meloxicam 15 MG tablet Commonly known as:  MOBIC TAKE 1 TABLET BY MOUTH EVERY DAY What changed:  See the new instructions.   methocarbamol 500 MG tablet Commonly known as:  ROBAXIN Take 1 tablet (500 mg total) by mouth every 8 (eight) hours as needed for muscle spasms.   oxyCODONE 5 MG immediate release tablet Commonly known as:  Oxy IR/ROXICODONE Take 1-2 tablets (5-10 mg total) by mouth every 4 (four) hours as needed for moderate pain.   sulfamethoxazole-trimethoprim 800-160 MG tablet Commonly known as:  BACTRIM DS,SEPTRA DS Take 1 tablet by mouth 2 (two) times daily.   tamoxifen 20 MG tablet Commonly known as:  NOLVADEX Take 1 tablet (20 mg total) by mouth daily.      Follow-up Information    Rolm Bookbinder, MD Follow up  in 3 week(s).   Specialty:  General Surgery Contact information: [address] [place] [address] [phone]        Irene Limbo, MD Follow up in 1 week(s).   Specialty:  Plastic Surgery Why:  as scheduled Contact information: [place] [address] [phone]           Signed: Irene Limbo 09/27/2017, 10:25 AM

## 2017-10-05 ENCOUNTER — Telehealth: Payer: Self-pay | Admitting: Oncology

## 2017-10-05 NOTE — Telephone Encounter (Signed)
Scheduled apt per 10/24 sch message - patietn is aware of appt date and time.

## 2017-10-19 DIAGNOSIS — Z9013 Acquired absence of bilateral breasts and nipples: Secondary | ICD-10-CM | POA: Diagnosis not present

## 2017-10-19 DIAGNOSIS — Z923 Personal history of irradiation: Secondary | ICD-10-CM | POA: Diagnosis not present

## 2017-10-19 DIAGNOSIS — D0512 Intraductal carcinoma in situ of left breast: Secondary | ICD-10-CM | POA: Diagnosis not present

## 2017-10-19 DIAGNOSIS — N6320 Unspecified lump in the left breast, unspecified quadrant: Secondary | ICD-10-CM | POA: Diagnosis not present

## 2017-10-19 DIAGNOSIS — L7634 Postprocedural seroma of skin and subcutaneous tissue following other procedure: Secondary | ICD-10-CM | POA: Diagnosis not present

## 2017-10-19 DIAGNOSIS — Z853 Personal history of malignant neoplasm of breast: Secondary | ICD-10-CM | POA: Diagnosis not present

## 2017-10-19 DIAGNOSIS — C50912 Malignant neoplasm of unspecified site of left female breast: Secondary | ICD-10-CM | POA: Diagnosis not present

## 2017-10-21 DIAGNOSIS — N6320 Unspecified lump in the left breast, unspecified quadrant: Secondary | ICD-10-CM | POA: Diagnosis not present

## 2017-10-21 DIAGNOSIS — C50912 Malignant neoplasm of unspecified site of left female breast: Secondary | ICD-10-CM | POA: Diagnosis not present

## 2017-10-21 DIAGNOSIS — Z853 Personal history of malignant neoplasm of breast: Secondary | ICD-10-CM | POA: Diagnosis not present

## 2017-10-21 DIAGNOSIS — D0512 Intraductal carcinoma in situ of left breast: Secondary | ICD-10-CM | POA: Diagnosis not present

## 2017-10-24 ENCOUNTER — Other Ambulatory Visit: Payer: Self-pay | Admitting: *Deleted

## 2017-10-24 NOTE — Patient Outreach (Signed)
[facility]) Care Management  10/24/2017  Nancy Austin [birth year] [id number]   Subjective: Telephone call to patient's home / mobile number, spoke with patient, and HIPAA verified.  Discussed [facility] Care Management UMR Transition of care follow up, patient voiced understanding, and is in agreement to follow up.      Objective: Per KPN (Knowledge Performance Now, point of care tool) and chart review, patient hospitalized 09/26/17 -09/27/17 for Ductal carcinoma in situ (DCIS) of left breast.   Status post prophylactic nipple sparing mastectomy short superior, long lateral, double marks anterior nipple areola , #2 right nipple margin,  #3 left nipple sparing mastectomy marked short superior, long lateral, double deep #4 left nipple margin, Bilateral breast reconstruction with tissue expanders,  Acellular dermis for breast reconstruction right chest 100 cm2. 3, and  Left latissimus flap for breast reconstruction ib 09/26/17.   Patient has a history of breast cancer.     Assessment: Received UMR Transition of care referral on 10/24/17.   Transition of care follow up pending patient contact.     Plan: RNCM will call patient for 2nd telephone outreach attempt, transition of care follow up, within 10 business days if no return call.     Josehua Hammar H. Annia Friendly, BSN, [place] Management [facility] Telephonic CM Phone: [phone] Fax: [phone]

## 2017-10-25 ENCOUNTER — Other Ambulatory Visit: Payer: Self-pay | Admitting: *Deleted

## 2017-10-25 ENCOUNTER — Encounter: Payer: Self-pay | Admitting: *Deleted

## 2017-10-25 NOTE — Patient Outreach (Signed)
[facility]) Care Management  10/25/2017  Nancy Austin 02/07/1976 [id number]   Subjective: Telephone call to patient's home / mobile number times 2, spoke with patient, and HIPAA verified.  Discussed [facility] Care Management UMR Transition of care follow up, patient voiced understanding, and is in agreement to follow up.    Patient states she is doing well, is independent with activities of daily living, has assistance with home management as needed, has had follow up appointment with surgeon, had follow up appointment plastic surgeon, and appointments went well.  States she continues to have weekly follow up appointments with plastic surgeon.  MD has ordered outpatient physical therapy that will start this week and will be going to [facility] facility.  Patient voices understanding of medical diagnosis, surgery, and treatment plan.  States she is accessing the following [facility] benefits: outpatient pharmacy, hospital indemnity (benefit not chosen), and husband who is the [employer] employee has  family medical leave act (FMLA) in place if needed.  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  Patient states she does not have any education material, transition of care, care coordination, disease management, disease monitoring, transportation, community resource, or pharmacy needs at this time.   States she is very appreciative of the follow up and is in agreement to receive [place] Management information.     Objective: Per KPN (Knowledge Performance Now, point of care tool) and chart review, patient hospitalized 09/26/17 -09/27/17 for Ductal carcinoma in situ (DCIS) of left breast.   Status post prophylactic nipple sparing mastectomy short superior, long lateral, double marks anterior nipple areola , #2 right nipple margin,  #3 left nipple sparing mastectomy marked short superior, long lateral, double deep #4 left nipple margin, Bilateral breast reconstruction with tissue expanders,  Acellular dermis  for breast reconstruction right chest 100 cm2. 3, and  Left latissimus flap for breast reconstruction ib 09/26/17.   Patient has a history of breast cancer.     Assessment: Received UMR Transition of care referral on 10/24/17.   Transition of care follow up completed, no care management needs, and will proceed with case closure.     Plan: RNCM will send patient successful outreach letter, [facility] pamphlet, and magnet. RNCM will send case closure due to follow up completed / no care management needs request to [facility].     Nizar Cutler H. Annia Friendly, BSN, [place] Management [facility] Telephonic CM Phone: [phone] Fax: [phone]

## 2017-10-26 DIAGNOSIS — Z9013 Acquired absence of bilateral breasts and nipples: Secondary | ICD-10-CM | POA: Insufficient documentation

## 2017-10-26 DIAGNOSIS — L7634 Postprocedural seroma of skin and subcutaneous tissue following other procedure: Secondary | ICD-10-CM | POA: Diagnosis not present

## 2017-11-01 ENCOUNTER — Ambulatory Visit: Payer: 59 | Attending: Plastic Surgery | Admitting: Physical Therapy

## 2017-11-01 ENCOUNTER — Encounter: Payer: Self-pay | Admitting: Physical Therapy

## 2017-11-01 DIAGNOSIS — M25611 Stiffness of right shoulder, not elsewhere classified: Secondary | ICD-10-CM | POA: Diagnosis not present

## 2017-11-01 DIAGNOSIS — M25612 Stiffness of left shoulder, not elsewhere classified: Secondary | ICD-10-CM | POA: Insufficient documentation

## 2017-11-01 DIAGNOSIS — M6281 Muscle weakness (generalized): Secondary | ICD-10-CM | POA: Insufficient documentation

## 2017-11-01 NOTE — Therapy (Signed)
[place], [place], [address] Phone: [phone]   Fax:  [phone]  Physical Therapy Evaluation  Patient Details  Name: Nancy Austin MRN: [id number] Date of Birth: 09-Mar-1976 Referring Provider: Thimmappa   Encounter Date: 11/01/2017  PT End of Session - 11/01/17 1348    Visit Number  1    Number of Visits  9    Date for PT Re-Evaluation  11/29/17    PT Start Time  1302    PT Stop Time  1342    PT Time Calculation (min)  40 min    Activity Tolerance  Patient tolerated treatment well    Behavior During Therapy  [facility] for tasks assessed/performed       Past Medical History:  Diagnosis Date  . Anemia   . Benign skin lesion of forehead 10/2015  . Breast cancer ([place])   . Cancer ([place])   . History of breast cancer 09/2014   left  . History of chemotherapy    finished 02/10/2015  . History of radiation therapy 04/16/2015 - 06/02/2015  . Personal history of radiation therapy     Past Surgical History:  Procedure Laterality Date  . BREAST LUMPECTOMY    . BREAST RECONSTRUCTION WITH PLACEMENT OF TISSUE EXPANDER AND FLEX HD (ACELLULAR HYDRATED DERMIS) Right 09/26/2017   Procedure: RIGHT BREAST RECONSTRUCTION WITH PLACEMENT OF TISSUE EXPANDER AND FLEX HD (ACELLULAR HYDRATED DERMIS);  Surgeon: Irene Limbo, MD;  Location: [facility];  Service: Plastics;  Laterality: Right;  . LAPAROSCOPIC SALPINGO OOPHERECTOMY Bilateral 11/12/2015   Procedure: LAPAROSCOPIC BILATERAL SALPINGO OOPHORECTOMY;  Surgeon: Paula Compton, MD;  Location: [facility];  Service: Gynecology;  Laterality: Bilateral;  . LATISSIMUS FLAP TO BREAST Left 09/26/2017   Procedure: LATISSIMUS FLAP TO LEFT BREAST;  Surgeon: Irene Limbo, MD;  Location: [facility];  Service: Plastics;  Laterality: Left;  . LESION REMOVAL Left 10/22/2015   Procedure: EXCISION OF FOREHEAD LESION 1X1CM;  Surgeon: Rolm Bookbinder, MD;  Location: [facility];  Service: General;  Laterality: Left;  . NIPPLE SPARING MASTECTOMY Bilateral 09/26/2017   Procedure: LEFT NIPPLE SPARING MASTECTOMY, RIGHT PROPHYLACTIC NIPPLE SPARING MASTECTOMY;  Surgeon: Rolm Bookbinder, MD;  Location: [place];  Service: General;  Laterality: Bilateral;  . PORT-A-CATH REMOVAL N/A 10/22/2015   Procedure: REMOVAL PORT-A-CATH;  Surgeon: Rolm Bookbinder, MD;  Location: [facility];  Service: General;  Laterality: N/A;  . PORTACATH PLACEMENT N/A 10/15/2014   Procedure: INSERTION PORT-A-CATH;  Surgeon: Rolm Bookbinder, MD;  Location: [facility];  Service: General;  Laterality: N/A;  . RADIOACTIVE SEED GUIDED EXCISIONAL BREAST BIOPSY Left 08/17/2017   Procedure: LEFT RADIOACTIVE SEED GUIDED EXCISIONAL BREAST BIOPSY ERAS PATHWAY;  Surgeon: Rolm Bookbinder, MD;  Location: [facility];  Service: General;  Laterality: Left;  . RADIOACTIVE SEED GUIDED PARTIAL MASTECTOMY WITH AXILLARY SENTINEL LYMPH NODE BIOPSY Left 03/06/2015   Procedure: RADIOACTIVE SEED GUIDED PARTIAL MASTECTOMY WITH AXILLARY SENTINEL LYMPH NODE BIOPSY;  Surgeon: Rolm Bookbinder, MD;  Location: [facility];  Service: General;  Laterality: Left;  . TONSILLECTOMY  1996    There were no vitals filed for this visit.   Subjective Assessment - 11/01/17 1307    Subjective  I am had a latissimus flap and I am having a little swelling. I have tightness when reaching up.     Pertinent History  breast cancer ,with chemo and lumpectomy with sentinal node (4 nodes removed) biopsy March 06 2015, completed radiation, breast  cancer return on the left side and pt underwent bilateral mastectomies on 09/26/17 no additional lymph nodes were taken, pt currently taking Tamoxifen    Patient Stated Goals  full range of motion    Currently in Pain?  Yes    Pain Score  5     Pain Location  Chest and around to back    Pain Orientation  Right;Left    Pain Descriptors / Indicators  Discomfort     Pain Type  Surgical pain    Pain Radiating Towards  n/a    Pain Onset  More than a month ago    Pain Frequency  Constant    Aggravating Factors   activity    Pain Relieving Factors  rest    Effect of Pain on Daily Activities  no effect         [facility] PT Assessment - 11/01/17 0001      Assessment   Medical Diagnosis  left breast cancer    Referring Provider  Thimmappa    Onset Date/Surgical Date  09/26/17    Hand Dominance  Right    Prior Therapy  pt for back 1 year ago, pt for shoulder ROM at this clinic in 2016      Precautions   Precautions  Other (comment)    Precaution Comments  at risk for developing lymphedema      Restrictions   Weight Bearing Restrictions  No      Balance Screen   Has the patient fallen in the past 6 months  No    Has the patient had a decrease in activity level because of a fear of falling?   No    Is the patient reluctant to leave their home because of a fear of falling?   No      Home Film/video editor residence    Living Arrangements  Spouse/significant other;Children husband and 2 children    Available Help at Discharge  Family    Type of [place] to enter    Entrance Stairs-Number of Steps  4    Entrance Stairs-Rails  Right    Home Layout  Two level    Alternate Level Stairs-Number of Steps  14    Alternate Level Stairs-Rails  Left      Prior Function   Level of Independence  Independent    Vocation  Unemployed    Leisure  pt states she is walking 3-4x/wk for 30-45 min, prior to surgery pt ran or walked she has not tried to run yet      Cognition   Overall Cognitive Status  Within Functional Limits for tasks assessed      Observation/Other Assessments   Observations  pt has healing scars, she reports having a lot of numbness, pt has some mild swelling present around lat flap scar      AROM   Right Shoulder Extension  94 Degrees    Right Shoulder Flexion  168 Degrees    Right  Shoulder ABduction  148 Degrees    Right Shoulder Internal Rotation  66 Degrees    Right Shoulder External Rotation  90 Degrees    Left Shoulder Extension  66 Degrees    Left Shoulder Flexion  149 Degrees    Left Shoulder ABduction  154 Degrees    Left Shoulder Internal Rotation  75 Degrees    Left Shoulder External Rotation  87 Degrees  LYMPHEDEMA/ONCOLOGY QUESTIONNAIRE - 11/01/17 1321      Type   Cancer Type  left breast cancer      Surgeries   Mastectomy Date  09/26/17    Sentinel Lymph Node Biopsy Date  09/26/17    Number Lymph Nodes Removed  4      Date Lymphedema/Swelling Started   Date  09/26/17      Treatment   Active Chemotherapy Treatment  No    Past Chemotherapy Treatment  Yes    Date  01/13/15    Active Radiation Treatment  No    Past Radiation Treatment  Yes    Date  05/14/15    Current Hormone Treatment  Yes    Drug Name  Tamoxifen    Past Hormone Therapy  No      What other symptoms do you have   Are you Having Heaviness or Tightness  Yes    Are you having Pain  No    Are you having pitting edema  No    Is it Hard or Difficult finding clothes that fit  No    Do you have infections  No    Is there Decreased scar mobility  No      Lymphedema Assessments   Lymphedema Assessments  Upper extremities      Right Upper Extremity Lymphedema   10 cm Proximal to Olecranon Process  23.8 cm    Olecranon Process  22.5 cm    10 cm Proximal to Ulnar Styloid Process  18.9 cm    Just Proximal to Ulnar Styloid Process  15.3 cm    Across Hand at [employer]  18.6 cm    At [place] of 2nd Digit  6 cm      Left Upper Extremity Lymphedema   10 cm Proximal to Olecranon Process  24.5 cm    Olecranon Process  22.5 cm    10 cm Proximal to Ulnar Styloid Process  19 cm    Just Proximal to Ulnar Styloid Process  15.3 cm    Across Hand at [employer]  18 cm    At [place] of 2nd Digit  5.7 cm        Quick Dash - 11/01/17 0001    Open a tight or new jar  Mild  difficulty    Do heavy household chores (wash walls, wash floors)  Moderate difficulty    Carry a shopping bag or briefcase  Mild difficulty    Wash your back  Moderate difficulty    Use a knife to cut food  No difficulty    Recreational activities in which you take some force or impact through your arm, shoulder, or hand (golf, hammering, tennis)  Moderate difficulty    During the past week, to what extent has your arm, shoulder or hand problem interfered with your normal social activities with family, friends, neighbors, or groups?  Slightly    During the past week, to what extent has your arm, shoulder or hand problem limited your work or other regular daily activities  Not at all    Arm, shoulder, or hand pain.  None    Tingling (pins and needles) in your arm, shoulder, or hand  None    Difficulty Sleeping  Mild difficulty    DASH Score  22.73 %       Objective measurements completed on examination: See above findings.      [facility] Adult PT Treatment/Exercise - 11/01/17 0001  Shoulder Exercises: Supine   Flexion  AAROM;10 reps with dowel with 5 sec holds    ABduction  AAROM;5 reps;Both with dowel with 5 sec holds             PT Education - 11/01/17 1347    Education provided  Yes    Education Details  post op breast cancer exercises, lymphedema risk reduction practices and anatomy and physiology of lymphatic system, supine dowel exercises    Person(s) Educated  Patient    Methods  Explanation;Handout    Comprehension  Verbalized understanding           [facility] Goals - 11/01/17 1349      CC Long Term Goal  #1   Title  Pt will demonstrate 168 degrees of left shoulder flexion to allow pt to reach items on top shelf    Baseline  149    Time  4    Period  Weeks    Status  New    Target Date  11/29/17      CC Long Term Goal  #2   Title  Pt will demonstrate 165 degrees of bilateral shoulder abduction to allow her to reach out to sides    Baseline  R  148, L 154    Time  4    Period  Weeks    Status  New    Target Date  11/29/17      CC Long Term Goal  #3   Title  Pt will be independent with a home exercise program for continued strengthening and stretching    Time  4    Period  Weeks    Status  New    Target Date  11/29/17      CC Long Term Goal  #4   Title  Pt will report at least a 65% improvement in back swelling around scar to allow improved comfort    Time  4    Period  Weeks    Status  New    Target Date  11/29/17          Plan - 11/01/17 1342    Clinical Impression Statement  Pt underwent a lumpectomy with SLNB in 2015 for treatment of left breast cancer. Her breast cancer return and pt recently (09/26/17) underwent a bilateral mastectomy with latissiumus flap reconstruction. She now presents with decreased bilateral shoulder ROM and strength and some swelling on back around latissimus scar. Pt completed chemo and radiation in 2016. Pt would benefit from skilled PT services to increase bilateral shoulder ROM and strength, decrease swelling and instruct pt in a home exercise program.    History and Personal Factors relevant to plan of care:  pt is right handed    Clinical Presentation  Stable    Clinical Presentation due to:  she has completed all treatment    Rehab Potential  Good    Clinical Impairments Affecting Rehab Potential  hx of chemo and radiation in 2016    PT Frequency  2x / week    PT Duration  4 weeks    PT Treatment/Interventions  ADLs/Self Care Home Management;Patient/family education;Manual lymph drainage;Manual techniques;Therapeutic exercise;Therapeutic activities;Scar mobilization;Passive range of motion;Taping    PT Next Visit Plan  begin AA/A/PROM to bilateral shoulders, do pulleys, ball and give supine scap, assess indep with dowel exercises    PT Home Exercise Plan  supine dowel and post op breast cancer exercises    Consulted and Agree with  Plan of Care  Patient       Patient will benefit  from skilled therapeutic intervention in order to improve the following deficits and impairments:  Decreased strength, Pain, Decreased knowledge of use of DME, Increased edema, Decreased range of motion, Increased fascial restricitons  Visit Diagnosis: Stiffness of left shoulder, not elsewhere classified - Plan: PT plan of care cert/re-cert  Stiffness of right shoulder, not elsewhere classified - Plan: PT plan of care cert/re-cert  Muscle weakness (generalized) - Plan: PT plan of care cert/re-cert     Problem List Patient Active Problem List   Diagnosis Date Noted  . Ductal carcinoma in situ (DCIS) of left breast 09/26/2017  . Physical exam 03/28/2017  . Low back pain at multiple sites 05/26/2016  . Genetic testing 10/17/2015  . Chemotherapy induced neutropenia ([place]) 02/03/2015  . Neuropathy due to chemotherapeutic drug ([place]) 01/13/2015  . Constipation 12/09/2014  . Hot flashes 12/09/2014  . Spotting 11/20/2014  . Vasovagal near syncope 11/16/2014  . Breast cancer of upper-outer quadrant of left female breast ([place]) 09/24/2014    Allyson Sabal [facility] 11/01/2017, 2:00 PM  [place], [place], [address] Phone: [phone]   Fax:  [phone]  Name: Nancy Austin MRN: [id number] Date of Birth: Dec 12, 1976  Manus Gunning, PT 11/01/17 2:01 PM

## 2017-11-01 NOTE — Patient Instructions (Signed)
Shoulder: Flexion (Supine)    With hands shoulder width apart, slowly lower dowel to floor behind head. Do not let elbows bend. Keep back flat. Hold _5-15___ seconds. Repeat __10__ times. Do ___2_ sessions per day. CAUTION: Stretch slowly and gently.  Copyright  VHI. All rights reserved.  Shoulder: Abduction (Supine)    With right arm flat on floor, hold dowel in palm. Slowly move arm up to side of head by pushing with opposite arm. Do not let elbow bend. Repeat on left side.  Hold _5-15___ seconds. Repeat _10___ times. Do __2__ sessions per day. CAUTION: Stretch slowly and gently.  Copyright  VHI. All rights reserved.

## 2017-11-16 ENCOUNTER — Ambulatory Visit: Payer: 59 | Attending: Plastic Surgery

## 2017-11-16 DIAGNOSIS — R293 Abnormal posture: Secondary | ICD-10-CM | POA: Diagnosis not present

## 2017-11-16 DIAGNOSIS — M25612 Stiffness of left shoulder, not elsewhere classified: Secondary | ICD-10-CM | POA: Insufficient documentation

## 2017-11-16 DIAGNOSIS — M25611 Stiffness of right shoulder, not elsewhere classified: Secondary | ICD-10-CM | POA: Diagnosis not present

## 2017-11-16 DIAGNOSIS — M6281 Muscle weakness (generalized): Secondary | ICD-10-CM | POA: Insufficient documentation

## 2017-11-16 NOTE — Patient Instructions (Signed)

## 2017-11-16 NOTE — Therapy (Deleted)
[place], [place], [address] Phone: [phone]   Fax:  [phone]  November 16, 2017   @[email or web address] @  Physical Therapy Discharge Summary  Patient: Nancy Austin  MRN: [id number]  Date of Birth: 1975/12/19   Diagnosis: Stiffness of left shoulder, not elsewhere classified  Stiffness of right shoulder, not elsewhere classified  Muscle weakness (generalized) Referring Provider: Thimmappa   The above patient had been seen in Physical Therapy *** times of *** treatments scheduled with *** no shows and *** cancellations.  The treatment consisted of *** The patient is: {improved/worse/unchanged:3041574}  Subjective: ***  Discharge Findings: ***  Functional Status at Discharge: ***  {RDEYC:1448185}  Plan - 11/16/17 1348    Clinical Impression Statement  Pt tolerated session very well today with AA/ROM and manual therapy for end ROM stretching. Also added supine scapular series onto HEP as pt tolerated this very well.     Rehab Potential  Good    Clinical Impairments Affecting Rehab Potential  hx of chemo and radiation in 2016    PT Frequency  2x / week    PT Duration  4 weeks    PT Treatment/Interventions  ADLs/Self Care Home Management;Patient/family education;Manual lymph drainage;Manual techniques;Therapeutic exercise;Therapeutic activities;Scar mobilization;Passive range of motion;Taping    PT Next Visit Plan  Cont AA/A/PROM to bilateral shoulders, pulleys, ball and review prn supine scap; cont with focus on end ROM manual stretching/myofascial release to Lt axilla    PT Home Exercise Plan  supine dowel and post op breast cancer exercises; supine scapular series       Sincerely,   Debbe Bales Luther Bradley, PTA   CC @[email or web address] @  [facility] [address] [place], [place], [address] Phone: [phone]   Fax:   [phone]  Patient: Nancy Austin  MRN: [id number]  Date of Birth: [birth year]

## 2017-11-16 NOTE — Therapy (Signed)
[place], [place], [address] Phone: [phone]   Fax:  [phone]  Physical Therapy Treatment  Patient Details  Name: Nancy Austin MRN: [id number] Date of Birth: [birth year] Referring Provider: Thimmappa   Encounter Date: 11/16/2017  PT End of Session - 11/16/17 1347    Visit Number  2    Number of Visits  9    Date for PT Re-Evaluation  11/29/17    PT Start Time  1302    PT Stop Time  9381    PT Time Calculation (min)  45 min    Activity Tolerance  Patient tolerated treatment well    Behavior During Therapy  [facility] for tasks assessed/performed       Past Medical History:  Diagnosis Date  . Anemia   . Benign skin lesion of forehead 10/2015  . Breast cancer ([place])   . Cancer ([place])   . History of breast cancer 09/2014   left  . History of chemotherapy    finished 02/10/2015  . History of radiation therapy 04/16/2015 - 06/02/2015  . Personal history of radiation therapy     Past Surgical History:  Procedure Laterality Date  . BREAST LUMPECTOMY    . BREAST RECONSTRUCTION WITH PLACEMENT OF TISSUE EXPANDER AND FLEX HD (ACELLULAR HYDRATED DERMIS) Right 09/26/2017   Procedure: RIGHT BREAST RECONSTRUCTION WITH PLACEMENT OF TISSUE EXPANDER AND FLEX HD (ACELLULAR HYDRATED DERMIS);  Surgeon: Irene Limbo, MD;  Location: [place];  Service: Plastics;  Laterality: Right;  . LAPAROSCOPIC SALPINGO OOPHERECTOMY Bilateral 11/12/2015   Procedure: LAPAROSCOPIC BILATERAL SALPINGO OOPHORECTOMY;  Surgeon: Paula Compton, MD;  Location: [facility];  Service: Gynecology;  Laterality: Bilateral;  . LATISSIMUS FLAP TO BREAST Left 09/26/2017   Procedure: LATISSIMUS FLAP TO LEFT BREAST;  Surgeon: Irene Limbo, MD;  Location: [place];  Service: Plastics;  Laterality: Left;  . LESION REMOVAL Left 10/22/2015   Procedure: EXCISION OF FOREHEAD LESION 1X1CM;  Surgeon: Rolm Bookbinder, MD;  Location: [place];   Service: General;  Laterality: Left;  . NIPPLE SPARING MASTECTOMY Bilateral 09/26/2017   Procedure: LEFT NIPPLE SPARING MASTECTOMY, RIGHT PROPHYLACTIC NIPPLE SPARING MASTECTOMY;  Surgeon: Rolm Bookbinder, MD;  Location: [place];  Service: General;  Laterality: Bilateral;  . PORT-A-CATH REMOVAL N/A 10/22/2015   Procedure: REMOVAL PORT-A-CATH;  Surgeon: Rolm Bookbinder, MD;  Location: [place];  Service: General;  Laterality: N/A;  . PORTACATH PLACEMENT N/A 10/15/2014   Procedure: INSERTION PORT-A-CATH;  Surgeon: Rolm Bookbinder, MD;  Location: [facility];  Service: General;  Laterality: N/A;  . RADIOACTIVE SEED GUIDED EXCISIONAL BREAST BIOPSY Left 08/17/2017   Procedure: LEFT RADIOACTIVE SEED GUIDED EXCISIONAL BREAST BIOPSY ERAS PATHWAY;  Surgeon: Rolm Bookbinder, MD;  Location: [place];  Service: General;  Laterality: Left;  . RADIOACTIVE SEED GUIDED PARTIAL MASTECTOMY WITH AXILLARY SENTINEL LYMPH NODE BIOPSY Left 03/06/2015   Procedure: RADIOACTIVE SEED GUIDED PARTIAL MASTECTOMY WITH AXILLARY SENTINEL LYMPH NODE BIOPSY;  Surgeon: Rolm Bookbinder, MD;  Location: [place];  Service: General;  Laterality: Left;  . TONSILLECTOMY  1996    There were no vitals filed for this visit.  Subjective Assessment - 11/16/17 1306    Subjective  Nothing new to report except I've been doing the exercises. Ithink they've helped a little.    Pertinent History  breast cancer ,with chemo and lumpectomy with sentinal node (4 nodes removed) biopsy March 06 2015, completed radiation, breast cancer return on the left side  and pt underwent bilateral mastectomies on 09/26/17 no additional lymph nodes were taken, pt currently taking Tamoxifen    Patient Stated Goals  full range of motion    Currently in Pain?  No/denies                      [facility] Adult PT Treatment/Exercise - 11/16/17 0001      Shoulder Exercises: Supine   Horizontal ABduction   Strengthening;Both;10 reps;Theraband    Theraband Level (Shoulder Horizontal ABduction)  Level 2 (Red)    External Rotation  Strengthening;Both;10 reps;Theraband    Theraband Level (Shoulder External Rotation)  Level 2 (Red)    Flexion  Strengthening;Both;10 reps;Theraband Narrow and Wide Grip, 10 times each    Theraband Level (Shoulder Flexion)  Level 2 (Red)    Other Supine Exercises  Bil D2 with red theraband 10 times each with pt returning correct therapist demonstration for all above exs.       Shoulder Exercises: Pulleys   Flexion  2 minutes    Flexion Limitations  2    ABduction  2 minutes    ABduction Limitations  VCs to decrease scapular compensation      Shoulder Exercises: Therapy Ball   Flexion  10 reps With forward lean into end of stretch    ABduction  5 reps Bil UE with side lean into end of stretch    Right/Left Limitations  Pt returned correct therapist demonstration      Manual Therapy   Manual Therapy  Myofascial release;Passive ROM    Myofascial Release  To bil axilla, but focused on Lt, especially at pect insertion    Passive ROM  To bil shoulders into flexion, abduction, and D2 to pts end ROM             PT Education - 11/16/17 1323    Education provided  Yes    Education Details  Supine scapular series with red theraband    Person(s) Educated  Patient    Methods  Explanation;Demonstration;Handout    Comprehension  Verbalized understanding;Returned demonstration        Short Term Clinic Goals - 03/19/15 1220      CC Short Term Goal  #1   Title  short term goals= long term goals          Long Term Clinic Goals - 11/01/17 1349      CC Long Term Goal  #1   Title  Pt will demonstrate 168 degrees of left shoulder flexion to allow pt to reach items on top shelf    Baseline  149    Time  4    Period  Weeks    Status  New    Target Date  11/29/17      CC Long Term Goal  #2   Title  Pt will demonstrate 165 degrees of bilateral shoulder  abduction to allow her to reach out to sides    Baseline  R 148, L 154    Time  4    Period  Weeks    Status  New    Target Date  11/29/17      CC Long Term Goal  #3   Title  Pt will be independent with a home exercise program for continued strengthening and stretching    Time  4    Period  Weeks    Status  New    Target Date  11/29/17      CC  Long Term Goal  #4   Title  Pt will report at least a 65% improvement in back swelling around scar to allow improved comfort    Time  4    Period  Weeks    Status  New    Target Date  11/29/17         Plan - 11/16/17 1348    Clinical Impression Statement  Pt tolerated session very well today with AA/ROM and manual therapy for end ROM stretching. Also added supine scapular series onto HEP as pt tolerated this very well.     Rehab Potential  Good    Clinical Impairments Affecting Rehab Potential  hx of chemo and radiation in 2016    PT Frequency  2x / week    PT Duration  4 weeks    PT Treatment/Interventions  ADLs/Self Care Home Management;Patient/family education;Manual lymph drainage;Manual techniques;Therapeutic exercise;Therapeutic activities;Scar mobilization;Passive range of motion;Taping    PT Next Visit Plan  Cont AA/A/PROM to bilateral shoulders, pulleys, ball and review prn supine scap; cont with focus on end ROM manual stretching/myofascial release to Lt axilla    PT Home Exercise Plan  supine dowel and post op breast cancer exercises; supine scapular series       Patient will benefit from skilled therapeutic intervention in order to improve the following deficits and impairments:  Decreased strength, Pain, Decreased knowledge of use of DME, Increased edema, Decreased range of motion, Increased fascial restricitons  Visit Diagnosis: Stiffness of left shoulder, not elsewhere classified  Stiffness of right shoulder, not elsewhere classified  Muscle weakness (generalized)     Problem List Patient Active Problem List    Diagnosis Date Noted  . Ductal carcinoma in situ (DCIS) of left breast 09/26/2017  . Physical exam 03/28/2017  . Low back pain at multiple sites 05/26/2016  . Genetic testing 10/17/2015  . Chemotherapy induced neutropenia ([place]) 02/03/2015  . Neuropathy due to chemotherapeutic drug ([place]) 01/13/2015  . Constipation 12/09/2014  . Hot flashes 12/09/2014  . Spotting 11/20/2014  . Vasovagal near syncope 11/16/2014  . Breast cancer of upper-outer quadrant of left female breast ([place]) 09/24/2014    Otelia Limes, PTA 11/16/2017, 1:51 PM  [place], [place], [address] Phone: [phone]   Fax:  [phone]  Name: Nancy Austin MRN: [id number] Date of Birth: [birth year]

## 2017-11-18 ENCOUNTER — Ambulatory Visit: Payer: 59

## 2017-11-24 ENCOUNTER — Encounter: Payer: Self-pay | Admitting: Physical Therapy

## 2017-11-24 ENCOUNTER — Ambulatory Visit: Payer: 59 | Admitting: Physical Therapy

## 2017-11-24 DIAGNOSIS — R293 Abnormal posture: Secondary | ICD-10-CM | POA: Diagnosis not present

## 2017-11-24 DIAGNOSIS — M25612 Stiffness of left shoulder, not elsewhere classified: Secondary | ICD-10-CM

## 2017-11-24 DIAGNOSIS — M6281 Muscle weakness (generalized): Secondary | ICD-10-CM

## 2017-11-24 DIAGNOSIS — M25611 Stiffness of right shoulder, not elsewhere classified: Secondary | ICD-10-CM | POA: Diagnosis not present

## 2017-11-24 NOTE — Therapy (Signed)
[place], [place], [address] Phone: [phone]   Fax:  [phone]  Physical Therapy Treatment  Patient Details  Name: Nancy Austin MRN: [id number] Date of Birth: May 04, 1976 Referring Provider: Thimmappa   Encounter Date: 11/24/2017  PT End of Session - 11/24/17 1347    Visit Number  3    Number of Visits  9    Date for PT Re-Evaluation  11/29/17    PT Start Time  8756    PT Stop Time  1345    PT Time Calculation (min)  40 min    Activity Tolerance  Patient tolerated treatment well    Behavior During Therapy  [facility] for tasks assessed/performed       Past Medical History:  Diagnosis Date  . Anemia   . Benign skin lesion of forehead 10/2015  . Breast cancer ([place])   . Cancer ([place])   . History of breast cancer 09/2014   left  . History of chemotherapy    finished 02/10/2015  . History of radiation therapy 04/16/2015 - 06/02/2015  . Personal history of radiation therapy     Past Surgical History:  Procedure Laterality Date  . BREAST LUMPECTOMY    . BREAST RECONSTRUCTION WITH PLACEMENT OF TISSUE EXPANDER AND FLEX HD (ACELLULAR HYDRATED DERMIS) Right 09/26/2017   Procedure: RIGHT BREAST RECONSTRUCTION WITH PLACEMENT OF TISSUE EXPANDER AND FLEX HD (ACELLULAR HYDRATED DERMIS);  Surgeon: Irene Limbo, MD;  Location: [place];  Service: Plastics;  Laterality: Right;  . LAPAROSCOPIC SALPINGO OOPHERECTOMY Bilateral 11/12/2015   Procedure: LAPAROSCOPIC BILATERAL SALPINGO OOPHORECTOMY;  Surgeon: Paula Compton, MD;  Location: [facility];  Service: Gynecology;  Laterality: Bilateral;  . LATISSIMUS FLAP TO BREAST Left 09/26/2017   Procedure: LATISSIMUS FLAP TO LEFT BREAST;  Surgeon: Irene Limbo, MD;  Location: [place];  Service: Plastics;  Laterality: Left;  . LESION REMOVAL Left 10/22/2015   Procedure: EXCISION OF FOREHEAD LESION 1X1CM;  Surgeon: Rolm Bookbinder, MD;  Location: [place];  Service: General;  Laterality: Left;  . NIPPLE SPARING MASTECTOMY Bilateral 09/26/2017   Procedure: LEFT NIPPLE SPARING MASTECTOMY, RIGHT PROPHYLACTIC NIPPLE SPARING MASTECTOMY;  Surgeon: Rolm Bookbinder, MD;  Location: [place];  Service: General;  Laterality: Bilateral;  . PORT-A-CATH REMOVAL N/A 10/22/2015   Procedure: REMOVAL PORT-A-CATH;  Surgeon: Rolm Bookbinder, MD;  Location: [facility];  Service: General;  Laterality: N/A;  . PORTACATH PLACEMENT N/A 10/15/2014   Procedure: INSERTION PORT-A-CATH;  Surgeon: Rolm Bookbinder, MD;  Location: [facility];  Service: General;  Laterality: N/A;  . RADIOACTIVE SEED GUIDED EXCISIONAL BREAST BIOPSY Left 08/17/2017   Procedure: LEFT RADIOACTIVE SEED GUIDED EXCISIONAL BREAST BIOPSY ERAS PATHWAY;  Surgeon: Rolm Bookbinder, MD;  Location: [place];  Service: General;  Laterality: Left;  . RADIOACTIVE SEED GUIDED PARTIAL MASTECTOMY WITH AXILLARY SENTINEL LYMPH NODE BIOPSY Left 03/06/2015   Procedure: RADIOACTIVE SEED GUIDED PARTIAL MASTECTOMY WITH AXILLARY SENTINEL LYMPH NODE BIOPSY;  Surgeon: Rolm Bookbinder, MD;  Location: [place];  Service: General;  Laterality: Left;  . TONSILLECTOMY  1996    There were no vitals filed for this visit.  Subjective Assessment - 11/24/17 1309    Subjective  My shoulders are doing pretty good. The band exercises are going good.     Pertinent History  breast cancer ,with chemo and lumpectomy with sentinal node (4 nodes removed) biopsy March 06 2015, completed radiation, breast cancer return on the left side and pt  underwent bilateral mastectomies on 09/26/17 no additional lymph nodes were taken, pt currently taking Tamoxifen    Patient Stated Goals  full range of motion    Currently in Pain?  No/denies    Pain Score  0-No pain                      [facility] Adult PT Treatment/Exercise - 11/24/17 0001      Shoulder Exercises: Supine   Horizontal  ABduction  Strengthening;Both;10 reps;Theraband    Theraband Level (Shoulder Horizontal ABduction)  Level 2 (Red)    External Rotation  Strengthening;Both;10 reps;Theraband    Theraband Level (Shoulder External Rotation)  Level 2 (Red)    Flexion  Strengthening;Both;10 reps;Theraband Narrow and Wide Grip, 10 times each    Theraband Level (Shoulder Flexion)  Level 2 (Red)    Other Supine Exercises  Bil D2 with red theraband 10 times each with pt returning correct therapist demonstration for all above exs.       Shoulder Exercises: Pulleys   Flexion  2 minutes    ABduction  2 minutes      Shoulder Exercises: Therapy Ball   Flexion  10 reps    ABduction  10 reps      Manual Therapy   Manual Therapy  Myofascial release;Passive ROM    Myofascial Release  To bil axilla, but focused on Lt, especially at pect insertion    Passive ROM  To bil shoulders into flexion, abduction, and D2 to pts end ROM                Short Term Clinic Goals - 03/19/15 1220      CC Short Term Goal  #1   Title  short term goals= long term goals          Long Term Clinic Goals - 11/24/17 1341      CC Long Term Goal  #1   Title  Pt will demonstrate 168 degrees of left shoulder flexion to allow pt to reach items on top shelf    Baseline  149, 11/24/17- 170    Time  4    Period  Weeks    Status  Achieved      CC Long Term Goal  #2   Title  Pt will demonstrate 165 degrees of bilateral shoulder abduction to allow her to reach out to sides    Baseline  R 148, L 154, 11/24/17-- R - 180, L - 178    Time  4    Period  Weeks    Status  Achieved      CC Long Term Goal  #3   Title  Pt will be independent with a home exercise program for continued strengthening and stretching    Time  4    Period  Weeks    Status  On-going      CC Long Term Goal  #4   Title  Pt will report at least a 65% improvement in back swelling around scar to allow improved comfort    Baseline  11/24/17- 75% improved     Time  4    Period  Weeks    Status  Achieved         Plan - 11/24/17 1345    Clinical Impression Statement  Patient has now met all ROM goals for therapy. She has regained full AROM in bilateral shoulders. Focus will now be placed on strengthening and progressing pt towards independence  with a home exercise program.     Rehab Potential  Good    Clinical Impairments Affecting Rehab Potential  hx of chemo and radiation in 2016    PT Frequency  2x / week    PT Duration  4 weeks    PT Treatment/Interventions  ADLs/Self Care Home Management;Patient/family education;Manual lymph drainage;Manual techniques;Therapeutic exercise;Therapeutic activities;Scar mobilization;Passive range of motion;Taping    PT Next Visit Plan  upgrade to green band for supine scap, give [place] exercises, 3 way shoulder and strength ABC program    PT Home Exercise Plan  supine dowel and post op breast cancer exercises; supine scapular series    Consulted and Agree with Plan of Care  Patient       Patient will benefit from skilled therapeutic intervention in order to improve the following deficits and impairments:  Decreased strength, Pain, Decreased knowledge of use of DME, Increased edema, Decreased range of motion, Increased fascial restricitons  Visit Diagnosis: Stiffness of left shoulder, not elsewhere classified  Stiffness of right shoulder, not elsewhere classified  Muscle weakness (generalized)     Problem List Patient Active Problem List   Diagnosis Date Noted  . Ductal carcinoma in situ (DCIS) of left breast 09/26/2017  . Physical exam 03/28/2017  . Low back pain at multiple sites 05/26/2016  . Genetic testing 10/17/2015  . Chemotherapy induced neutropenia ([place]) 02/03/2015  . Neuropathy due to chemotherapeutic drug ([place]) 01/13/2015  . Constipation 12/09/2014  . Hot flashes 12/09/2014  . Spotting 11/20/2014  . Vasovagal near syncope 11/16/2014  . Breast cancer of upper-outer quadrant of left  female breast ([place]) 09/24/2014    Allyson Sabal [facility] 11/24/2017, 1:48 PM  [place], [place], [address] Phone: [phone]   Fax:  [phone]  Name: Nancy Austin MRN: [id number] Date of Birth: [birth year]  Manus Gunning, PT 11/24/17 1:48 PM

## 2017-11-25 NOTE — Progress Notes (Signed)
Nancy Austin  [phone] Fax:(336) [phone]     ID: Nancy Austin DOB: 12-Jan-1976  MR#: [id number]  DDU#:[id number]  Patient Care Team: Midge Minium, MD as PCP - General (Family Medicine) Rolm Bookbinder, MD as Consulting Physician (General Surgery) Gery Pray, MD as Consulting Physician (Radiation Oncology) Tecumseh Yeagley, Virgie Dad, MD as Consulting Physician (Oncology) Sylvan Cheese, NP as Nurse Practitioner (Hematology and Oncology) Paula Compton, MD as Consulting Physician (Obstetrics and Gynecology) OTHER MD:  CHIEF COMPLAINT: Triple positive breast cancer  CURRENT TREATMENT:  tamoxifen   BREAST CANCER HISTORY: From the original Intake note:  [name] herself found a lump in her left breast early October 2015 and brought it to her gynecologist attention. On 09/20/2014 she was set up for bilateral diagnostic mammography and left breast ultrasonography of the breast Center. This was the patient's first ever mammogram. In the area of concern in the left breast there was suspicious pleomorphic calcifications spanning 1.1 cm. There was no discrete mammographic mass in this dense breasts (category C.). On physical exam, there was a palpable firm at 1.5 cm mass at the 3:00 position in the left breast. By ultrasound this was irregular and hypoechoic and measured 1.8 cm. Ultrasound of the left axilla was unremarkable. Aside from multiple cysts in the left breast there were no other findings of concern.  On the same day, 09/20/2014, the patient underwent biopsy of the left breast palpable mass. This showed ([id number]) an invasive ductal carcinoma, grade 2, estrogen receptor 100% positive, progesterone receptor 89% positive, both with strong staining intensity, with an MIB-1 of 16%. HER-2 was amplified with a signals ratio of 5.07 and a copy number per cell of 6.85.   On 09/30/2014 the patient underwent bilateral breast MRIs. This showed a 1.7  cm mass in the upper outer quadrant of the left breast. There was no other suspicious finding in either breast and no abnormal appearing adenopathy.  The patient's subsequent history is as detailed below.  INTERVAL HISTORY: Nancy Austin returns today for follow-up and treatment of her estrogen receptor positive breast cancer.  She continues on tamoxifen, with good tolerance. She still has hot flashes, but they mainly occur at night. She notes that the gabapentin helps. She also notes some occasional increase in vaginal wetness.  Since her last visit, she underwent left breast lumpectomy ([id number]) on 08/17/2017 showing: Intermediate grade ductal carcinoma in situ with calcifications, spanning approximately 0.9 cm. Insitu carcinoma comes within < 0.1 cm of the posterior margin, 0.1 cm of the anterior-medial margin, and 0.2-0.3 cm of the superior margin. Lobular neoplasia (atypical lobular hyperplasia). ER/PR negative 0%  She then underwent bilateral mastectomies with nipple sparing ([id number]) on 09/26/2017 showing: Right breast: Fibrocytic and fibroadenomatoid change with calcifications. No malignancy identified. Right nipple biopsy: benign nipple tissue. No malignancy identified. Left breast: Intermediate grade ductal carcinoma in situ. Resection site changes. In situ carcinoma is <0.1 cm of the anterior margin focally. Left nipple biopsy: Lobular neoplasia (atypical lobular hyperplasia). No malignancy identified.   REVIEW OF SYSTEMS: [name] reports that she has some breast tenderness that she notices when she lays down. She has a latissimus flap on the left side of her breast and an expander on the right side. She notes that she has been walking and was able to run as of yesterday. She notes that she is completing physical therapy, and she is slowly and increasingly able to lift weights. She denies unusual headaches, visual changes, nausea, vomiting, or dizziness. There  has been no unusual cough,  phlegm production, or pleurisy. This been no change in bowel or bladder habits. She denies unexplained fatigue or unexplained weight loss, bleeding, rash, or fever. A detailed review of systems was otherwise stable.    PAST MEDICAL HISTORY: Past Medical History:  Diagnosis Date  . Anemia   . Benign skin lesion of forehead 10/2015  . Breast cancer ([place])   . Cancer ([place])   . History of breast cancer 09/2014   left  . History of chemotherapy    finished 02/10/2015  . History of radiation therapy 04/16/2015 - 06/02/2015  . Personal history of radiation therapy     PAST SURGICAL HISTORY: Past Surgical History:  Procedure Laterality Date  . BREAST LUMPECTOMY    . BREAST RECONSTRUCTION WITH PLACEMENT OF TISSUE EXPANDER AND FLEX HD (ACELLULAR HYDRATED DERMIS) Right 09/26/2017   Procedure: RIGHT BREAST RECONSTRUCTION WITH PLACEMENT OF TISSUE EXPANDER AND FLEX HD (ACELLULAR HYDRATED DERMIS);  Surgeon: Irene Limbo, MD;  Location: [facility];  Service: Plastics;  Laterality: Right;  . LAPAROSCOPIC SALPINGO OOPHERECTOMY Bilateral 11/12/2015   Procedure: LAPAROSCOPIC BILATERAL SALPINGO OOPHORECTOMY;  Surgeon: Paula Compton, MD;  Location: [facility];  Service: Gynecology;  Laterality: Bilateral;  . LATISSIMUS FLAP TO BREAST Left 09/26/2017   Procedure: LATISSIMUS FLAP TO LEFT BREAST;  Surgeon: Irene Limbo, MD;  Location: [facility];  Service: Plastics;  Laterality: Left;  . LESION REMOVAL Left 10/22/2015   Procedure: EXCISION OF FOREHEAD LESION 1X1CM;  Surgeon: Rolm Bookbinder, MD;  Location: [facility];  Service: General;  Laterality: Left;  . NIPPLE SPARING MASTECTOMY Bilateral 09/26/2017   Procedure: LEFT NIPPLE SPARING MASTECTOMY, RIGHT PROPHYLACTIC NIPPLE SPARING MASTECTOMY;  Surgeon: Rolm Bookbinder, MD;  Location: [facility];  Service: General;  Laterality: Bilateral;  . PORT-A-CATH REMOVAL N/A 10/22/2015   Procedure: REMOVAL PORT-A-CATH;  Surgeon: Rolm Bookbinder, MD;   Location: [facility];  Service: General;  Laterality: N/A;  . PORTACATH PLACEMENT N/A 10/15/2014   Procedure: INSERTION PORT-A-CATH;  Surgeon: Rolm Bookbinder, MD;  Location: [facility];  Service: General;  Laterality: N/A;  . RADIOACTIVE SEED GUIDED EXCISIONAL BREAST BIOPSY Left 08/17/2017   Procedure: LEFT RADIOACTIVE SEED GUIDED EXCISIONAL BREAST BIOPSY ERAS PATHWAY;  Surgeon: Rolm Bookbinder, MD;  Location: [facility];  Service: General;  Laterality: Left;  . RADIOACTIVE SEED GUIDED PARTIAL MASTECTOMY WITH AXILLARY SENTINEL LYMPH NODE BIOPSY Left 03/06/2015   Procedure: RADIOACTIVE SEED GUIDED PARTIAL MASTECTOMY WITH AXILLARY SENTINEL LYMPH NODE BIOPSY;  Surgeon: Rolm Bookbinder, MD;  Location: [facility];  Service: General;  Laterality: Left;  . TONSILLECTOMY  1996    FAMILY HISTORY Family History  Problem Relation Age of Onset  . Skin cancer Father   . Hyperlipidemia Father   . Prostate cancer Father   . Prostate cancer Maternal Uncle 56       currently 47  . Multiple myeloma Paternal Grandmother 67       Deceased 64  . Cancer Paternal Grandmother        bone  . Prostate cancer Paternal Uncle   . Lung disease Maternal Grandmother   . AAA (abdominal aortic aneurysm) Maternal Grandmother   . Stroke Maternal Grandfather   The patient's parents are both living. The patient has one brother, no sisters. One grandmother was diagnosed with multiple myeloma at age 34. There is no history of breast or ovarian cancer in the family.   GYNECOLOGIC HISTORY:  No LMP recorded. Patient is not currently having  periods (Reason: Other).  Menarche age 49, first live birth age 25. The patient is GX P2. She was still having regular periods at the start of chemotherapy. She was on birth control pills on and off for the last 15 years, stopping in October of 2015.   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 Her  husband Rolla Plate works as a Immunologist at [employer]. Their children are aged 46, and 27 as of August 2018    ADVANCED DIRECTIVES: In place   HEALTH MAINTENANCE: Social History   Tobacco Use  . Smoking status: Never Smoker  . Smokeless tobacco: Never Used  Substance Use Topics  . Alcohol use: Yes    Comment: 3 x/week  . Drug use: No     Colonoscopy:  PAP:  November 2014  Bone density:  Lipid panel:  Allergies  Allergen Reactions  . Chlorhexidine Itching and Rash  . Morphine And Related Nausea And Vomiting  . Tegaderm Ag Mesh [Silver] Rash    Reports that the larger tegaderm causes rashes    Current Outpatient Medications  Medication Sig Dispense Refill  . diphenhydrAMINE (BENADRYL) 25 MG tablet Take 25 mg by mouth at bedtime as needed for sleep. Reported on 06/07/2016    . gabapentin (NEURONTIN) 300 MG capsule Take 1 capsule (300 mg total) by mouth at bedtime. 90 capsule 3  . meloxicam (MOBIC) 15 MG tablet TAKE 1 TABLET BY MOUTH EVERY DAY (Patient taking differently: Take 15 mg by mouth at bedtime) 30 tablet 1  . tamoxifen (NOLVADEX) 20 MG tablet Take 1 tablet (20 mg total) by mouth daily. 90 tablet 3   No current facility-administered medications for this visit.     OBJECTIVE: young white woman who looks well  Vitals:   11/28/17 1037  BP: 115/73  Pulse: (!) 52  Resp: 17  Temp: (!) 97.5 F (36.4 C)  SpO2: 100%     Body mass index is 22.29 kg/m.    ECOG FS:0 - Asymptomatic   Filed Weights   11/28/17 1037  Weight: 138 lb 1.6 oz (62.6 kg)   Sclerae unicteric, pupils round and equal Oropharynx clear and moist No cervical or supraclavicular adenopathy Lungs no rales or rhonchi Heart regular rate and rhythm Abd soft, nontender, positive bowel sounds MSK no focal spinal tenderness, no upper extremity lymphedema Neuro: nonfocal, well oriented, appropriate affect Breasts: The right breast is status post mastectomy with expander in place.  The left breast is status post  latissimus flap with expander in place.  Both incisions are healing well.  The cosmetic result is very good.  There is no evidence of chest wall recurrence.  Both axillae are benign  LAB RESULTS:  CMP     Component Value Date/Time   NA 139 09/27/2017 0706   NA 141 08/02/2017 1536   K 4.1 09/27/2017 0706   K 4.0 08/02/2017 1536   CL 105 09/27/2017 0706   CO2 29 09/27/2017 0706   CO2 32 (H) 08/02/2017 1536   GLUCOSE 123 (H) 09/27/2017 0706   GLUCOSE 93 08/02/2017 1536   BUN 5 (L) 09/27/2017 0706   BUN 11.4 08/02/2017 1536   CREATININE 0.69 09/27/2017 0706   CREATININE 0.8 08/02/2017 1536   CALCIUM 8.6 (L) 09/27/2017 0706   CALCIUM 9.8 08/02/2017 1536   PROT 6.7 08/02/2017 1536   ALBUMIN 3.9 08/02/2017 1536   AST 22 08/02/2017 1536   ALT 12 08/02/2017 1536   ALKPHOS 51  08/02/2017 1536   BILITOT 0.31 08/02/2017 1536   GFRNONAA >60 09/27/2017 0706   GFRAA >60 09/27/2017 0706    I No results found for: SPEP  Lab Results  Component Value Date   WBC 8.4 09/27/2017   NEUTROABS 1.9 08/02/2017   HGB 10.1 (L) 09/27/2017   HCT 31.2 (L) 09/27/2017   MCV 91.0 09/27/2017   PLT 186 09/27/2017      Chemistry      Component Value Date/Time   NA 139 09/27/2017 0706   NA 141 08/02/2017 1536   K 4.1 09/27/2017 0706   K 4.0 08/02/2017 1536   CL 105 09/27/2017 0706   CO2 29 09/27/2017 0706   CO2 32 (H) 08/02/2017 1536   BUN 5 (L) 09/27/2017 0706   BUN 11.4 08/02/2017 1536   CREATININE 0.69 09/27/2017 0706   CREATININE 0.8 08/02/2017 1536      Component Value Date/Time   CALCIUM 8.6 (L) 09/27/2017 0706   CALCIUM 9.8 08/02/2017 1536   ALKPHOS 51 08/02/2017 1536   AST 22 08/02/2017 1536   ALT 12 08/02/2017 1536   BILITOT 0.31 08/02/2017 1536       No results found for: LABCA2  No components found for: LABCA125  No results for input(s): INR in the last 168 hours.  Urinalysis No results found for: COLORURINE  STUDIES: No results found.   ASSESSMENT: 41 y.o. BRCA  negative  woman s/p Left breast upper outer quadrant biopsy 09/20/2014, for a clinical T1c N0, stage IA invasive ductal carcinoma, grade 2, estrogen and progesterone receptor positive, HER-2 amplified with a signal is ratio of 5.07, and an MIB-1 of 16%  (1) neoadjuvant treatment consisting of carboplatin, docetaxel, trastuzumab and pertuzumab given every 21 days x6,  completed 02/10/2015  (a) breast MRI 02/11/2015 showed a complete radiologic response  (2) left lumpectomy and sentinel lymph node sampling 03/06/2015 showed a residual ypT1c ypN1a, stage IIA invasive ductal carcinoma, grade 1, with repeat prognostic panel still triple positive, and negative margins  (3) trastuzumab continued to total one year (last dose 10/01/2015)  (a) final echo 08/07/2015 finds an ejection fraction of 55-60%.  (4) adjuvant radiation 04/16/2015-06/02/2015 Left breast 50.4 gray in 28 fractions, axillary/supraclavicular region 45 gray in 25 fractions, lumpectomy boost 10 gray in 5 fractions  (5) started tamoxifen 06/26/2015  (a) goserelin started 04/03/2015, last dose 10/06/2015  (b) laparoscopic bilateral salpingo-oophorectomy 11/12/2015 with benign pathology  (6)  the [employer] geneprofile  ([facility]) obtained November 2015 did not reveal a mutation in ATM, BARD1, BRCA1, BRCA BRIP1, CDH1, CHEK2, MRE11A, MUTYH, NBN, NF1, PALB2, PTEN, RAD50, RAD51C, RAD51D, or TP53  (7) left lumpectomy 08/17/2017 showed ductal carcinoma in situ, grade 2, estrogen and progesterone receptor negative, with close though negative margins  (8) bilateral mastectomies 09/26/2017 showed  (a) on the right, no malignancy  (b) on the left, ductal carcinoma in situ, grade 2, with a close but negative anterior margin (skin)    PLAN: Jaslen is now nearly 3 years out from definitive surgery for her invasive estrogen receptor positive breast cancer with no evidence of disease recurrence.  This is very favorable.  She  continues on tamoxifen, generally with good tolerance.  She understands the second cancer she developed, in the left breast, was noninvasive and more importantly was estrogen receptor negative.  We would not expect the tamoxifen to have any effect on estrogen receptor negative breast cancers.  Accordingly this does not indicate a failure of tamoxifen and no change over  antiestrogen is call for.  We are continuing the tamoxifen to a total of 10 years as originally planned to treat on prophylaxis regarding her earlier invasive estrogen receptor positive breast cancer.  As far as the estrogen receptor negative noninvasive breast cancer, that was cured with mastectomy.  She is planning for silicone implants.  She understands we do not generally do any scans or MRIs in the absence of specific symptoms to follow although some physicians are doing MRIs every couple of years or so to assess the integrity of the implants.  We certainly can discuss that in the future  She is scheduled for definitive implant placement late January 2019.  Otherwise she will start seeing me on a once a year basis, in July.  She knows to call for any other issues that may develop before her next visit.    Arleen Bar, Virgie Dad, MD  11/28/17 10:49 AM Medical Oncology and Hematology [facility] [address] [place], [place] [address] Tel. [phone]    Fax. [phone]  This document serves as a record of services personally performed by Lurline Del, MD. It was created on his behalf by Sheron Nightingale, a trained medical scribe. The creation of this record is based on the scribe's personal observations and the provider's statements to them.   I have reviewed the above documentation for accuracy and completeness, and I agree with the above.

## 2017-11-28 ENCOUNTER — Ambulatory Visit: Payer: 59

## 2017-11-28 ENCOUNTER — Ambulatory Visit (HOSPITAL_BASED_OUTPATIENT_CLINIC_OR_DEPARTMENT_OTHER): Payer: 59 | Admitting: Oncology

## 2017-11-28 VITALS — BP 115/73 | HR 52 | Temp 97.5°F | Resp 17 | Ht 66.0 in | Wt 138.1 lb

## 2017-11-28 DIAGNOSIS — R293 Abnormal posture: Secondary | ICD-10-CM | POA: Diagnosis not present

## 2017-11-28 DIAGNOSIS — Z170 Estrogen receptor positive status [ER+]: Secondary | ICD-10-CM | POA: Diagnosis not present

## 2017-11-28 DIAGNOSIS — D0512 Intraductal carcinoma in situ of left breast: Secondary | ICD-10-CM

## 2017-11-28 DIAGNOSIS — M6281 Muscle weakness (generalized): Secondary | ICD-10-CM

## 2017-11-28 DIAGNOSIS — M25611 Stiffness of right shoulder, not elsewhere classified: Secondary | ICD-10-CM

## 2017-11-28 DIAGNOSIS — C50412 Malignant neoplasm of upper-outer quadrant of left female breast: Secondary | ICD-10-CM

## 2017-11-28 DIAGNOSIS — M25612 Stiffness of left shoulder, not elsewhere classified: Secondary | ICD-10-CM | POA: Diagnosis not present

## 2017-11-28 MED ORDER — TAMOXIFEN CITRATE 20 MG PO TABS: 20.0000 mg | ORAL_TABLET | Freq: Every day | ORAL | 3 refills | Status: DC

## 2017-11-28 NOTE — Patient Instructions (Addendum)
Strengthening: Resisted Flexion    Cancer Rehab [phone]    Hold tubing with left arm at side. Pull forward and up. Move shoulder through pain-free range of motion. Repeat _5-10___ times per set. Do _1-2___ sessions per day.  Strengthening: Resisted Internal Rotation    Hold tubing in left hand, elbow at side and forearm out. Rotate forearm in across body. Repeat _5-10___ times per set. Do _1-2___ sessions per day.  Strengthening: Resisted Extension    Hold tubing in left hand, arm forward. Pull arm back, elbow straight. Repeat __5-10__ times per set. Do __1-2__ sessions per day.   Strengthening: Resisted External Rotation    Hold tubing in left hand, elbow at side and forearm across body. Rotate forearm out. Repeat _5-10___ times per set. Do __1-2__ sessions per day.   3 Way Raises:      Starting Position:  Leaning against wall, walk feet a few inches away from the wall and make tummy tight (tuck hips underneath you) Press back/shoulders/head against wall as much as possible. Keep thumbs up to ceiling, elbows straight and shoulders relaxed/down throughout.  1. Lift arms in front to shoulder height 2. Lift arms a little wider into a "V" to shoulder height 3. Lift arms out to sides in a "T" to shoulder height  Perform 10 times in each direction. Hold 1-2 lbs to start with and work up to 2-3 sets of 10/day. Perform 3-4 times/week. Increase weight as able, decreasing sets of 10 each time you increase weights, then slowly working your way back up to 2-3 sets each time.    Cancer Rehab [phone]

## 2017-11-28 NOTE — Therapy (Signed)
[place], [place], [address] Phone: [phone]   Fax:  [phone]  Physical Therapy Treatment  Patient Details  Name: KARRIN EISENMENGER MRN: [id number] Date of Birth: [birth year] Referring Provider: Thimmappa   Encounter Date: 11/28/2017  PT End of Session - 11/28/17 1010    Visit Number  4    Number of Visits  9    Date for PT Re-Evaluation  11/29/17    PT Start Time  0937    PT Stop Time  1015    PT Time Calculation (min)  38 min    Activity Tolerance  Patient tolerated treatment well    Behavior During Therapy  [facility] for tasks assessed/performed       Past Medical History:  Diagnosis Date  . Anemia   . Benign skin lesion of forehead 10/2015  . Breast cancer ([place])   . Cancer ([place])   . History of breast cancer 09/2014   left  . History of chemotherapy    finished 02/10/2015  . History of radiation therapy 04/16/2015 - 06/02/2015  . Personal history of radiation therapy     Past Surgical History:  Procedure Laterality Date  . BREAST LUMPECTOMY    . BREAST RECONSTRUCTION WITH PLACEMENT OF TISSUE EXPANDER AND FLEX HD (ACELLULAR HYDRATED DERMIS) Right 09/26/2017   Procedure: RIGHT BREAST RECONSTRUCTION WITH PLACEMENT OF TISSUE EXPANDER AND FLEX HD (ACELLULAR HYDRATED DERMIS);  Surgeon: Irene Limbo, MD;  Location: [facility];  Service: Plastics;  Laterality: Right;  . LAPAROSCOPIC SALPINGO OOPHERECTOMY Bilateral 11/12/2015   Procedure: LAPAROSCOPIC BILATERAL SALPINGO OOPHORECTOMY;  Surgeon: Paula Compton, MD;  Location: [facility];  Service: Gynecology;  Laterality: Bilateral;  . LATISSIMUS FLAP TO BREAST Left 09/26/2017   Procedure: LATISSIMUS FLAP TO LEFT BREAST;  Surgeon: Irene Limbo, MD;  Location: [facility];  Service: Plastics;  Laterality: Left;  . LESION REMOVAL Left 10/22/2015   Procedure: EXCISION OF FOREHEAD LESION 1X1CM;  Surgeon: Rolm Bookbinder, MD;  Location: [facility];  Service: General;  Laterality: Left;  . NIPPLE SPARING MASTECTOMY Bilateral 09/26/2017   Procedure: LEFT NIPPLE SPARING MASTECTOMY, RIGHT PROPHYLACTIC NIPPLE SPARING MASTECTOMY;  Surgeon: Rolm Bookbinder, MD;  Location: [facility];  Service: General;  Laterality: Bilateral;  . PORT-A-CATH REMOVAL N/A 10/22/2015   Procedure: REMOVAL PORT-A-CATH;  Surgeon: Rolm Bookbinder, MD;  Location: [facility];  Service: General;  Laterality: N/A;  . PORTACATH PLACEMENT N/A 10/15/2014   Procedure: INSERTION PORT-A-CATH;  Surgeon: Rolm Bookbinder, MD;  Location: [facility];  Service: General;  Laterality: N/A;  . RADIOACTIVE SEED GUIDED EXCISIONAL BREAST BIOPSY Left 08/17/2017   Procedure: LEFT RADIOACTIVE SEED GUIDED EXCISIONAL BREAST BIOPSY ERAS PATHWAY;  Surgeon: Rolm Bookbinder, MD;  Location: [facility];  Service: General;  Laterality: Left;  . RADIOACTIVE SEED GUIDED PARTIAL MASTECTOMY WITH AXILLARY SENTINEL LYMPH NODE BIOPSY Left 03/06/2015   Procedure: RADIOACTIVE SEED GUIDED PARTIAL MASTECTOMY WITH AXILLARY SENTINEL LYMPH NODE BIOPSY;  Surgeon: Rolm Bookbinder, MD;  Location: [facility];  Service: General;  Laterality: Left;  . TONSILLECTOMY  1996    There were no vitals filed for this visit.  Subjective Assessment - 11/28/17 0939    Subjective  I feel like my ROM is getting better, though I can still feel the tightness in my Lt>Rt axilla. I wen trunning yesterday for first time and that seemed to go pretty well.      Pertinent History  breast cancer ,with chemo and  lumpectomy with sentinal node (4 nodes removed) biopsy March 06 2015, completed radiation, breast cancer return on the left side and pt underwent bilateral mastectomies on 09/26/17 no additional lymph nodes were taken, pt currently taking Tamoxifen    Patient Stated Goals  full range of motion    Currently in Pain?  No/denies                      [facility] Adult PT  Treatment/Exercise - 11/28/17 0001      Shoulder Exercises: Standing   External Rotation  Strengthening;Left;10 reps;Theraband    Theraband Level (Shoulder External Rotation)  Level 3 (Green)    Internal Rotation  Strengthening;Left;10 reps;Theraband    Theraband Level (Shoulder Internal Rotation)  Level 3 (Green)    Flexion  Strengthening;Left;10 reps;Theraband    Theraband Level (Shoulder Flexion)  Level 3 (Green)    Extension  Strengthening;Left;10 reps;Theraband    Theraband Level (Shoulder Extension)  Level 3 (Green)      Shoulder Exercises: Therapy Ball   Flexion  10 reps With forward lean into end of stretch    ABduction  10 reps Bil UE abduction with side lean into end of stretch      Manual Therapy   Manual Therapy  Myofascial release;Passive ROM    Myofascial Release  To bil axilla, but focused on Lt, especially at pect insertion    Passive ROM  To bil shoulders into flexion, abduction, and D2 to pts end ROM             PT Education - 11/28/17 1005    Education provided  Yes    Education Details  Issued green for supine scapular series and instructed in [place] with green theraband    Person(s) Educated  Patient    Methods  Explanation;Demonstration;Handout    Comprehension  Verbalized understanding;Returned demonstration        Short Term Clinic Goals - 03/19/15 1220      CC Short Term Goal  #1   Title  short term goals= long term goals          Long Term Clinic Goals - 11/24/17 1341      CC Long Term Goal  #1   Title  Pt will demonstrate 168 degrees of left shoulder flexion to allow pt to reach items on top shelf    Baseline  149, 11/24/17- 170    Time  4    Period  Weeks    Status  Achieved      CC Long Term Goal  #2   Title  Pt will demonstrate 165 degrees of bilateral shoulder abduction to allow her to reach out to sides    Baseline  R 148, L 154, 11/24/17-- R - 180, L - 178    Time  4    Period  Weeks    Status  Achieved      CC Long  Term Goal  #3   Title  Pt will be independent with a home exercise program for continued strengthening and stretching    Time  4    Period  Weeks    Status  On-going      CC Long Term Goal  #4   Title  Pt will report at least a 65% improvement in back swelling around scar to allow improved comfort    Baseline  11/24/17- 75% improved    Time  4    Period  Weeks  Status  Achieved         Plan - 11/28/17 1013    Clinical Impression Statement  Pt came in reporting feeling some tightness at Lt>Rt axillae and wanted to focus on stretching some so did this. Then progressed HEP to include [place] with green theraband and instructed pt use green with supine scapular series with exercises that were beginning to feel easier. Pt went running for first time yesterday (~3 mi) and reports other than some leg soreness she feels good from this. Opted not to instruct pt in Strength ABC Program today and she may not need this as she has a variety of bil UE exercises and will be getting back into running again.     Rehab Potential  Good    Clinical Impairments Affecting Rehab Potential  hx of chemo and radiation in 2016    PT Frequency  2x / week    PT Duration  4 weeks    PT Treatment/Interventions  ADLs/Self Care Home Management;Patient/family education;Manual lymph drainage;Manual techniques;Therapeutic exercise;Therapeutic activities;Scar mobilization;Passive range of motion;Taping    PT Next Visit Plan  Cont focus on strength of bil shoulders and review HEP issued today, possibly have pt perform supine scap with green theraband to assess difficulty with this. Potential for D/C in next few session as pt is doing well, though next visit need to decide D/C or renewal.    Consulted and Agree with Plan of Care  Patient       Patient will benefit from skilled therapeutic intervention in order to improve the following deficits and impairments:  Decreased strength, Pain, Decreased knowledge of use of DME,  Increased edema, Decreased range of motion, Increased fascial restricitons  Visit Diagnosis: Stiffness of left shoulder, not elsewhere classified  Stiffness of right shoulder, not elsewhere classified  Muscle weakness (generalized)     Problem List Patient Active Problem List   Diagnosis Date Noted  . Ductal carcinoma in situ (DCIS) of left breast 09/26/2017  . Physical exam 03/28/2017  . Low back pain at multiple sites 05/26/2016  . Genetic testing 10/17/2015  . Chemotherapy induced neutropenia ([place]) 02/03/2015  . Neuropathy due to chemotherapeutic drug ([place]) 01/13/2015  . Constipation 12/09/2014  . Hot flashes 12/09/2014  . Spotting 11/20/2014  . Vasovagal near syncope 11/16/2014  . Malignant neoplasm of upper-outer quadrant of left breast in female, estrogen receptor positive ([place]) 09/24/2014    Otelia Limes, PTA 11/28/2017, 10:20 AM  [place], [place], [address] Phone: [phone]   Fax:  [phone]  Name: MELEANE SELINGER MRN: [id number] Date of Birth: 1976-06-28

## 2017-11-30 ENCOUNTER — Encounter: Payer: Self-pay | Admitting: Physical Therapy

## 2017-11-30 ENCOUNTER — Ambulatory Visit: Payer: 59 | Admitting: Physical Therapy

## 2017-11-30 DIAGNOSIS — M25611 Stiffness of right shoulder, not elsewhere classified: Secondary | ICD-10-CM | POA: Diagnosis not present

## 2017-11-30 DIAGNOSIS — M6281 Muscle weakness (generalized): Secondary | ICD-10-CM | POA: Diagnosis not present

## 2017-11-30 DIAGNOSIS — R293 Abnormal posture: Secondary | ICD-10-CM | POA: Diagnosis not present

## 2017-11-30 DIAGNOSIS — M25612 Stiffness of left shoulder, not elsewhere classified: Secondary | ICD-10-CM | POA: Diagnosis not present

## 2017-11-30 NOTE — Therapy (Signed)
[place], [place], [address] Phone: [phone]   Fax:  [phone]  Physical Therapy Treatment  Patient Details  Name: Nancy Austin MRN: [id number] Date of Birth: 09/23/1976 Referring Provider: Thimmappa   Encounter Date: 11/30/2017  PT End of Session - 11/30/17 1345    Visit Number  5    Number of Visits  9    Date for PT Re-Evaluation  11/29/17    PT Start Time  1497    PT Stop Time  1345    PT Time Calculation (min)  39 min    Activity Tolerance  Patient tolerated treatment well    Behavior During Therapy  WFL for tasks assessed/performed       Past Medical History:  Diagnosis Date  . Anemia   . Benign skin lesion of forehead 10/2015  . Breast cancer ([place])   . Cancer ([place])   . History of breast cancer 09/2014   left  . History of chemotherapy    finished 02/10/2015  . History of radiation therapy 04/16/2015 - 06/02/2015  . Personal history of radiation therapy     Past Surgical History:  Procedure Laterality Date  . BREAST LUMPECTOMY    . BREAST RECONSTRUCTION WITH PLACEMENT OF TISSUE EXPANDER AND FLEX HD (ACELLULAR HYDRATED DERMIS) Right 09/26/2017   Procedure: RIGHT BREAST RECONSTRUCTION WITH PLACEMENT OF TISSUE EXPANDER AND FLEX HD (ACELLULAR HYDRATED DERMIS);  Surgeon: Irene Limbo, MD;  Location: [facility];  Service: Plastics;  Laterality: Right;  . LAPAROSCOPIC SALPINGO OOPHERECTOMY Bilateral 11/12/2015   Procedure: LAPAROSCOPIC BILATERAL SALPINGO OOPHORECTOMY;  Surgeon: Paula Compton, MD;  Location: [facility];  Service: Gynecology;  Laterality: Bilateral;  . LATISSIMUS FLAP TO BREAST Left 09/26/2017   Procedure: LATISSIMUS FLAP TO LEFT BREAST;  Surgeon: Irene Limbo, MD;  Location: [facility];  Service: Plastics;  Laterality: Left;  . LESION REMOVAL Left 10/22/2015   Procedure: EXCISION OF FOREHEAD LESION 1X1CM;  Surgeon: Rolm Bookbinder, MD;  Location: [facility];  Service: General;  Laterality: Left;  . NIPPLE SPARING MASTECTOMY Bilateral 09/26/2017   Procedure: LEFT NIPPLE SPARING MASTECTOMY, RIGHT PROPHYLACTIC NIPPLE SPARING MASTECTOMY;  Surgeon: Rolm Bookbinder, MD;  Location: [facility];  Service: General;  Laterality: Bilateral;  . PORT-A-CATH REMOVAL N/A 10/22/2015   Procedure: REMOVAL PORT-A-CATH;  Surgeon: Rolm Bookbinder, MD;  Location: [facility];  Service: General;  Laterality: N/A;  . PORTACATH PLACEMENT N/A 10/15/2014   Procedure: INSERTION PORT-A-CATH;  Surgeon: Rolm Bookbinder, MD;  Location[facility];  Service: General;  Laterality: N/A;  . RADIOACTIVE SEED GUIDED EXCISIONAL BREAST BIOPSY Left 08/17/2017   Procedure: LEFT RADIOACTIVE SEED GUIDED EXCISIONAL BREAST BIOPSY ERAS PATHWAY;  Surgeon: Rolm Bookbinder, MD;  Location: [facility];  Service: General;  Laterality: Left;  . RADIOACTIVE SEED GUIDED PARTIAL MASTECTOMY WITH AXILLARY SENTINEL LYMPH NODE BIOPSY Left 03/06/2015   Procedure: RADIOACTIVE SEED GUIDED PARTIAL MASTECTOMY WITH AXILLARY SENTINEL LYMPH NODE BIOPSY;  Surgeon: Rolm Bookbinder, MD;  Location: [facility];  Service: General;  Laterality: Left;  . TONSILLECTOMY  1996    There were no vitals filed for this visit.  Subjective Assessment - 11/30/17 1309    Subjective  I think I am ready for discharge. I just have a little tightness at the end ROM.     Pertinent History  breast cancer ,with chemo and lumpectomy with sentinal node (4 nodes removed) biopsy March 06 2015, completed radiation, breast cancer return on  the left side and pt underwent bilateral mastectomies on 09/26/17 no additional lymph nodes were taken, pt currently taking Tamoxifen    Patient Stated Goals  full range of motion    Currently in Pain?  No/denies    Pain Score  0-No pain                      [facility] Adult PT Treatment/Exercise - 11/30/17 0001      Shoulder Exercises: Supine    Horizontal ABduction  Strengthening;Both;10 reps;Theraband    Theraband Level (Shoulder Horizontal ABduction)  Level 3 (Green)    External Rotation  Strengthening;Both;10 reps;Theraband    Theraband Level (Shoulder External Rotation)  Level 3 (Green)    Flexion  Strengthening;Both;10 reps;Theraband Narrow and Wide Grip, 10 times each    Theraband Level (Shoulder Flexion)  Level 3 (Green)    Other Supine Exercises  Bil D2 with green theraband 10 times each with pt returning correct therapist demonstration for all above exs.       Shoulder Exercises: Standing   External Rotation  Strengthening;Left;10 reps;Theraband    Theraband Level (Shoulder External Rotation)  Level 3 (Green)    Internal Rotation  Strengthening;Left;10 reps;Theraband    Theraband Level (Shoulder Internal Rotation)  Level 3 (Green)    Flexion  Strengthening;Left;10 reps;Theraband    Theraband Level (Shoulder Flexion)  Level 3 (Green)    Extension  Strengthening;Left;10 reps;Theraband    Theraband Level (Shoulder Extension)  Level 3 (Green)      Shoulder Exercises: Pulleys   Flexion  2 minutes    ABduction  2 minutes      Shoulder Exercises: Therapy Ball   Flexion  10 reps With forward lean into end of stretch    ABduction  10 reps Bil UE abduction with side lean into end of stretch                Short Term Clinic Goals - 03/19/15 1220      CC Short Term Goal  #1   Title  short term goals= long term goals          Long Term Clinic Goals - 11/30/17 1309      CC Long Term Goal  #1   Title  Pt will demonstrate 168 degrees of left shoulder flexion to allow pt to reach items on top shelf    Baseline  149, 11/24/17- 170    Time  4    Period  Weeks    Status  Achieved      CC Long Term Goal  #2   Title  Pt will demonstrate 165 degrees of bilateral shoulder abduction to allow her to reach out to sides    Baseline  R 148, L 154, 11/24/17-- R - 180, L - 178    Time  4    Period  Weeks    Status   Achieved      CC Long Term Goal  #3   Title  Pt will be independent with a home exercise program for continued strengthening and stretching    Time  4    Period  Weeks    Status  Achieved      CC Long Term Goal  #4   Title  Pt will report at least a 65% improvement in back swelling around scar to allow improved comfort    Baseline  11/24/17- 75% improved    Time  4    Period  Weeks    Status  Achieved         Plan - 11/30/17 1345    Clinical Impression Statement  Pt has met all goals for therapy at this time and is ready to be discharged from skilled PT services. She is now independent with a home exercise program and has regained full bilateral shoulder ROM.     Rehab Potential  Good    Clinical Impairments Affecting Rehab Potential  hx of chemo and radiation in 2016    PT Frequency  2x / week    PT Duration  4 weeks    PT Treatment/Interventions  ADLs/Self Care Home Management;Patient/family education;Manual lymph drainage;Manual techniques;Therapeutic exercise;Therapeutic activities;Scar mobilization;Passive range of motion;Taping    PT Next Visit Plan  dc this visit    PT Home Exercise Plan  supine dowel and post op breast cancer exercises; supine scapular series, 3 way shoulder, rockwood    Consulted and Agree with Plan of Care  Patient       Patient will benefit from skilled therapeutic intervention in order to improve the following deficits and impairments:  Decreased strength, Pain, Decreased knowledge of use of DME, Increased edema, Decreased range of motion, Increased fascial restricitons  Visit Diagnosis: Stiffness of left shoulder, not elsewhere classified  Stiffness of right shoulder, not elsewhere classified  Muscle weakness (generalized)  Abnormal posture     Problem List Patient Active Problem List   Diagnosis Date Noted  . Ductal carcinoma in situ (DCIS) of left breast 09/26/2017  . Physical exam 03/28/2017  . Low back pain at multiple sites  05/26/2016  . Genetic testing 10/17/2015  . Chemotherapy induced neutropenia ([place]) 02/03/2015  . Neuropathy due to chemotherapeutic drug ([place]) 01/13/2015  . Constipation 12/09/2014  . Hot flashes 12/09/2014  . Spotting 11/20/2014  . Vasovagal near syncope 11/16/2014  . Malignant neoplasm of upper-outer quadrant of left breast in female, estrogen receptor positive ([place]) 09/24/2014    Allyson Sabal [facility] 11/30/2017, 1:47 PM  [employer] [place], [place], [address] Phone: [phone]   Fax:  [phone]  Name: TATANISHA CUTHBERT MRN: [id number] Date of Birth: Apr 21, 1976  PHYSICAL THERAPY DISCHARGE SUMMARY  Visits from Start of Care: 5  Current functional level related to goals / functional outcomes: Pt has met all goals for therapy   Remaining deficits: none   Education / Equipment: HEP  Plan: Patient agrees to discharge.  Patient goals were met. Patient is being discharged due to meeting the stated rehab goals.  ?????     Allyson Sabal [place], [place] 11/30/17 1:48 PM

## 2017-12-12 NOTE — H&P (Signed)
Subjective:     Patient ID: Nancy Austin is a 41 y.o. female.  HPI  2.5 months post op. Plan implant exchange next month.  History significant for left breast triple positive breast ca diagnosed 09/2014. She underwent neoadjuvant chemotherapy followed by lumpectomy/SLN with residual ypT1c ypN1a tumor. She completed 1 year adjuvant Herceptin and adjuvant radiation complete 05/2015. She represented with palpable nodule in left breast. MMG/US showed new pleomorphic calcs near lumpectomy site. She underwent open biopsy with DCIS noted, ER/PR-. Final pathology right benign, left with intermediate grade DCIS, left nipple margin with ALH.  Genetics 2015 normal.   Prior to lumpectomy B cup. Prior to mastectomy similar volume, did not note significant change volume. Right mastectomy 126 g Left mastectomy 154 g  Husband is CRNA at [employer]. Patient is now home maker, two children at home.   Review of Systems     Objective:   Physical Exam  Cardiovascular: Normal rate, regular rhythm and normal heart sounds.   Pulmonary/Chest: Effort normal and breath sounds normal.  Abdominal: Soft.   Chest: Right expander higher on chest with more upper pole fullness SN to nipple R 21 L 22 BW R13 L 13 (CW13) Nipple to IMF R 7 L 7  Back:scar maturing    Assessment:     Left breast DCIS ER/PR- History left breast triple positive IDC, s/p lumpectomy SLN History adjuvant radiation S/p bilateral NSM, right TE/ADM (Alloderm) reconstruction, left LD/TE reconstruction    Plan:      Plan removal TE and placement implants. Discussed silicone vs saline, smooth vs textured, shaped vs round.  Reviewed ALCL association with textured implants, benefit of texturing with regards to rotation and displacement. Discussed rippling, capacity filled or cohesive gel may reduce these risks over saline. Reviewed MRI surveillance silicone implants, risk rupture, capsular contracture, rippling, infection requiring  removal. She has elected for silicone, plan smooth round.  Discussed role of fat grafting to thicken mastectomy flaps, correct contour. Reviewed donor site pain bruising need for compression variable take of graft and possible need to repeat, fat necrosis that presents as masses. She has not decided on this and will let me know prior to surgery. Reviewed can always do in future if needed. If she does elect for this recommend purchase compression garment for use.  Plan OP surgery. Do not anticipate drains.   Rx for Bactrim and Robaxin given, has pain medication at home.  She developed rash post last surgery and required steroids- states this has occurred with multiple prep types. We used Duraprep last surgery. She is ok with using tissue glue.  Natrelle 133MX-12-T 400 ml tissue expanders placed bilateral,  RIGHT 290 ml total fill volume LEFT  300 ml total fill volume  Irene Limbo, MD [facility] Plastic [facility] Reconstruct[facility] Surgery [phone], pin [phone]

## 2017-12-30 ENCOUNTER — Encounter (HOSPITAL_BASED_OUTPATIENT_CLINIC_OR_DEPARTMENT_OTHER): Payer: Self-pay | Admitting: *Deleted

## 2017-12-30 ENCOUNTER — Other Ambulatory Visit: Payer: Self-pay

## 2018-01-04 NOTE — Progress Notes (Signed)
Pt given Ensure and instructed to drink by 0415 day of surgery.

## 2018-01-06 ENCOUNTER — Encounter (HOSPITAL_BASED_OUTPATIENT_CLINIC_OR_DEPARTMENT_OTHER)
Admission: RE | Disposition: A | Discharge status: Home / Self Care | Payer: Self-pay | Source: Ambulatory Visit | Attending: Plastic Surgery

## 2018-01-06 ENCOUNTER — Ambulatory Visit (HOSPITAL_BASED_OUTPATIENT_CLINIC_OR_DEPARTMENT_OTHER)
Admission: RE | Admit: 2018-01-06 | Discharge: 2018-01-06 | Disposition: A | Discharge status: Home / Self Care | Payer: 59 | Source: Ambulatory Visit | Attending: Plastic Surgery | Admitting: Plastic Surgery

## 2018-01-06 ENCOUNTER — Other Ambulatory Visit: Payer: Self-pay

## 2018-01-06 ENCOUNTER — Encounter (HOSPITAL_BASED_OUTPATIENT_CLINIC_OR_DEPARTMENT_OTHER): Payer: Self-pay | Admitting: Anesthesiology

## 2018-01-06 ENCOUNTER — Ambulatory Visit (HOSPITAL_BASED_OUTPATIENT_CLINIC_OR_DEPARTMENT_OTHER): Payer: 59 | Admitting: Anesthesiology

## 2018-01-06 DIAGNOSIS — Z853 Personal history of malignant neoplasm of breast: Secondary | ICD-10-CM | POA: Diagnosis not present

## 2018-01-06 DIAGNOSIS — Z923 Personal history of irradiation: Secondary | ICD-10-CM | POA: Diagnosis not present

## 2018-01-06 DIAGNOSIS — Z79810 Long term (current) use of selective estrogen receptor modulators (SERMs): Secondary | ICD-10-CM | POA: Diagnosis not present

## 2018-01-06 DIAGNOSIS — K5900 Constipation, unspecified: Secondary | ICD-10-CM | POA: Diagnosis not present

## 2018-01-06 DIAGNOSIS — Z9013 Acquired absence of bilateral breasts and nipples: Secondary | ICD-10-CM | POA: Diagnosis not present

## 2018-01-06 DIAGNOSIS — Z79899 Other long term (current) drug therapy: Secondary | ICD-10-CM | POA: Insufficient documentation

## 2018-01-06 DIAGNOSIS — Z421 Encounter for breast reconstruction following mastectomy: Secondary | ICD-10-CM | POA: Insufficient documentation

## 2018-01-06 DIAGNOSIS — Z9221 Personal history of antineoplastic chemotherapy: Secondary | ICD-10-CM | POA: Insufficient documentation

## 2018-01-06 HISTORY — PX: REMOVAL OF BILATERAL TISSUE EXPANDERS WITH PLACEMENT OF BILATERAL BREAST IMPLANTS: SHX6431

## 2018-01-06 SURGERY — REMOVAL, TISSUE EXPANDER, BREAST, BILATERAL, WITH BILATERAL IMPLANT IMPLANT INSERTION
Anesthesia: General | Laterality: Bilateral

## 2018-01-06 MED ORDER — CEFAZOLIN SODIUM-DEXTROSE 2-4 GM/100ML-% IV SOLN
2.0000 g | INTRAVENOUS | Status: AC
  Administered 2018-01-06: 2 g via INTRAVENOUS

## 2018-01-06 MED ORDER — GABAPENTIN 300 MG PO CAPS
ORAL_CAPSULE | ORAL | Status: AC
  Filled 2018-01-06: qty 1

## 2018-01-06 MED ORDER — EPHEDRINE SULFATE 50 MG/ML IJ SOLN
INTRAMUSCULAR | Status: DC | PRN
  Administered 2018-01-06: 10 mg via INTRAVENOUS
  Administered 2018-01-06 (×6): 5 mg via INTRAVENOUS

## 2018-01-06 MED ORDER — PROPOFOL 500 MG/50ML IV EMUL
INTRAVENOUS | Status: AC
  Filled 2018-01-06: qty 50

## 2018-01-06 MED ORDER — MEPERIDINE HCL 25 MG/ML IJ SOLN: 6.2500 mg | INTRAMUSCULAR | Status: DC | PRN

## 2018-01-06 MED ORDER — PROMETHAZINE HCL 25 MG/ML IJ SOLN: 6.2500 mg | INTRAMUSCULAR | Status: DC | PRN

## 2018-01-06 MED ORDER — LIDOCAINE 2% (20 MG/ML) 5 ML SYRINGE
INTRAMUSCULAR | Status: AC
  Filled 2018-01-06: qty 5

## 2018-01-06 MED ORDER — BUPIVACAINE HCL (PF) 0.25 % IJ SOLN
INTRAMUSCULAR | Status: AC
  Filled 2018-01-06: qty 30

## 2018-01-06 MED ORDER — BUPIVACAINE HCL (PF) 0.25 % IJ SOLN
INTRAMUSCULAR | Status: DC | PRN
  Administered 2018-01-06: 30 mL

## 2018-01-06 MED ORDER — CELECOXIB 200 MG PO CAPS
200.0000 mg | ORAL_CAPSULE | ORAL | Status: AC
  Administered 2018-01-06: 200 mg via ORAL

## 2018-01-06 MED ORDER — FENTANYL CITRATE (PF) 100 MCG/2ML IJ SOLN
INTRAMUSCULAR | Status: AC
  Filled 2018-01-06: qty 2

## 2018-01-06 MED ORDER — HYDROMORPHONE HCL 1 MG/ML IJ SOLN: 0.2500 mg | INTRAMUSCULAR | Status: DC | PRN

## 2018-01-06 MED ORDER — SUGAMMADEX SODIUM 200 MG/2ML IV SOLN
INTRAVENOUS | Status: AC
  Filled 2018-01-06: qty 2

## 2018-01-06 MED ORDER — ONDANSETRON HCL 4 MG/2ML IJ SOLN
INTRAMUSCULAR | Status: DC | PRN
  Administered 2018-01-06: 4 mg via INTRAVENOUS

## 2018-01-06 MED ORDER — GABAPENTIN 300 MG PO CAPS
300.0000 mg | ORAL_CAPSULE | ORAL | Status: AC
  Administered 2018-01-06: 300 mg via ORAL

## 2018-01-06 MED ORDER — DEXAMETHASONE SODIUM PHOSPHATE 10 MG/ML IJ SOLN
INTRAMUSCULAR | Status: AC
  Filled 2018-01-06: qty 1

## 2018-01-06 MED ORDER — LACTATED RINGERS IV SOLN: INTRAVENOUS | Status: DC

## 2018-01-06 MED ORDER — OXYCODONE HCL 5 MG/5ML PO SOLN: 5.0000 mg | Freq: Once | ORAL | Status: DC | PRN

## 2018-01-06 MED ORDER — BUPIVACAINE-EPINEPHRINE (PF) 0.5% -1:200000 IJ SOLN
INTRAMUSCULAR | Status: AC
  Filled 2018-01-06: qty 1.8

## 2018-01-06 MED ORDER — PROPOFOL 10 MG/ML IV BOLUS
INTRAVENOUS | Status: DC | PRN
  Administered 2018-01-06: 150 mg via INTRAVENOUS

## 2018-01-06 MED ORDER — DIPHENHYDRAMINE HCL 50 MG/ML IJ SOLN
INTRAMUSCULAR | Status: DC | PRN
  Administered 2018-01-06: 12.5 mg via INTRAVENOUS

## 2018-01-06 MED ORDER — DIPHENHYDRAMINE HCL 50 MG/ML IJ SOLN
INTRAMUSCULAR | Status: AC
  Filled 2018-01-06: qty 1

## 2018-01-06 MED ORDER — SUGAMMADEX SODIUM 200 MG/2ML IV SOLN
INTRAVENOUS | Status: DC | PRN
  Administered 2018-01-06: 200 mg via INTRAVENOUS

## 2018-01-06 MED ORDER — LIDOCAINE 2% (20 MG/ML) 5 ML SYRINGE
INTRAMUSCULAR | Status: DC | PRN
  Administered 2018-01-06: 60 mg via INTRAVENOUS

## 2018-01-06 MED ORDER — MIDAZOLAM HCL 2 MG/2ML IJ SOLN
INTRAMUSCULAR | Status: AC
  Filled 2018-01-06: qty 2

## 2018-01-06 MED ORDER — OXYCODONE HCL 5 MG PO TABS: 5.0000 mg | ORAL_TABLET | Freq: Once | ORAL | Status: DC | PRN

## 2018-01-06 MED ORDER — SCOPOLAMINE 1 MG/3DAYS TD PT72
1.0000 | MEDICATED_PATCH | Freq: Once | TRANSDERMAL | Status: DC | PRN
  Administered 2018-01-06: 1.5 mg via TRANSDERMAL

## 2018-01-06 MED ORDER — OXYCODONE HCL 5 MG PO TABS
ORAL_TABLET | ORAL | Status: AC
  Filled 2018-01-06: qty 1

## 2018-01-06 MED ORDER — ACETAMINOPHEN 500 MG PO TABS
1000.0000 mg | ORAL_TABLET | ORAL | Status: AC
  Administered 2018-01-06: 1000 mg via ORAL

## 2018-01-06 MED ORDER — ROCURONIUM BROMIDE 100 MG/10ML IV SOLN
INTRAVENOUS | Status: DC | PRN
  Administered 2018-01-06: 40 mg via INTRAVENOUS

## 2018-01-06 MED ORDER — BUPIVACAINE-EPINEPHRINE (PF) 0.5% -1:200000 IJ SOLN
INTRAMUSCULAR | Status: AC
  Filled 2018-01-06: qty 30

## 2018-01-06 MED ORDER — ACETAMINOPHEN 500 MG PO TABS
ORAL_TABLET | ORAL | Status: AC
  Filled 2018-01-06: qty 2

## 2018-01-06 MED ORDER — SCOPOLAMINE 1 MG/3DAYS TD PT72
MEDICATED_PATCH | TRANSDERMAL | Status: AC
  Filled 2018-01-06: qty 1

## 2018-01-06 MED ORDER — DEXAMETHASONE SODIUM PHOSPHATE 4 MG/ML IJ SOLN
INTRAMUSCULAR | Status: DC | PRN
  Administered 2018-01-06: 10 mg via INTRAVENOUS

## 2018-01-06 MED ORDER — EPHEDRINE 5 MG/ML INJ
INTRAVENOUS | Status: AC
  Filled 2018-01-06: qty 10

## 2018-01-06 MED ORDER — LACTATED RINGERS IV SOLN
INTRAVENOUS | Status: DC
  Administered 2018-01-06 (×2): via INTRAVENOUS

## 2018-01-06 MED ORDER — SODIUM CHLORIDE 0.9 % IV SOLN
INTRAVENOUS | Status: DC | PRN
  Administered 2018-01-06: 500 mL

## 2018-01-06 MED ORDER — ONDANSETRON HCL 4 MG/2ML IJ SOLN
INTRAMUSCULAR | Status: AC
  Filled 2018-01-06: qty 2

## 2018-01-06 MED ORDER — CEFAZOLIN SODIUM-DEXTROSE 2-4 GM/100ML-% IV SOLN
INTRAVENOUS | Status: AC
  Filled 2018-01-06: qty 100

## 2018-01-06 MED ORDER — CELECOXIB 200 MG PO CAPS
ORAL_CAPSULE | ORAL | Status: AC
  Filled 2018-01-06: qty 1

## 2018-01-06 MED ORDER — FENTANYL CITRATE (PF) 100 MCG/2ML IJ SOLN
50.0000 ug | INTRAMUSCULAR | Status: DC | PRN
  Administered 2018-01-06: 50 ug via INTRAVENOUS
  Administered 2018-01-06: 100 ug via INTRAVENOUS

## 2018-01-06 MED ORDER — MIDAZOLAM HCL 2 MG/2ML IJ SOLN
1.0000 mg | INTRAMUSCULAR | Status: DC | PRN
  Administered 2018-01-06: 2 mg via INTRAVENOUS

## 2018-01-06 SURGICAL SUPPLY — 83 items
BAG DECANTER FOR FLEXI CONT (MISCELLANEOUS) ×3 IMPLANT
BINDER ABDOMINAL 10 UNV 27-48 (MISCELLANEOUS) IMPLANT
BINDER ABDOMINAL 12 SM 30-45 (SOFTGOODS) IMPLANT
BINDER BREAST 3XL (GAUZE/BANDAGES/DRESSINGS) IMPLANT
BINDER BREAST LRG (GAUZE/BANDAGES/DRESSINGS) IMPLANT
BINDER BREAST MEDIUM (GAUZE/BANDAGES/DRESSINGS) IMPLANT
BINDER BREAST XLRG (GAUZE/BANDAGES/DRESSINGS) ×3 IMPLANT
BINDER BREAST XXLRG (GAUZE/BANDAGES/DRESSINGS) IMPLANT
BLADE SURG 10 STRL SS (BLADE) ×6 IMPLANT
BLADE SURG 11 STRL SS (BLADE) ×3 IMPLANT
BNDG GAUZE ELAST 4 BULKY (GAUZE/BANDAGES/DRESSINGS) ×6 IMPLANT
CANISTER LIPO FAT HARVEST (MISCELLANEOUS) ×3 IMPLANT
CANISTER SUCT 1200ML W/VALVE (MISCELLANEOUS) ×3 IMPLANT
CHLORAPREP W/TINT 26ML (MISCELLANEOUS) ×3 IMPLANT
COVER BACK TABLE 60X90IN (DRAPES) ×3 IMPLANT
COVER MAYO STAND STRL (DRAPES) ×3 IMPLANT
DECANTER SPIKE VIAL GLASS SM (MISCELLANEOUS) IMPLANT
DERMABOND ADVANCED (GAUZE/BANDAGES/DRESSINGS) ×4
DERMABOND ADVANCED .7 DNX12 (GAUZE/BANDAGES/DRESSINGS) ×2 IMPLANT
DRAIN CHANNEL 15F RND FF W/TCR (WOUND CARE) IMPLANT
DRAPE TOP ARMCOVERS (MISCELLANEOUS) ×3 IMPLANT
DRAPE U-SHAPE 76X120 STRL (DRAPES) ×3 IMPLANT
DRSG PAD ABDOMINAL 8X10 ST (GAUZE/BANDAGES/DRESSINGS) ×6 IMPLANT
ELECT BLADE 4.0 EZ CLEAN MEGAD (MISCELLANEOUS) ×3
ELECT COATED BLADE 2.86 ST (ELECTRODE) ×3 IMPLANT
ELECT REM PT RETURN 9FT ADLT (ELECTROSURGICAL) ×3
ELECTRODE BLDE 4.0 EZ CLN MEGD (MISCELLANEOUS) ×1 IMPLANT
ELECTRODE REM PT RTRN 9FT ADLT (ELECTROSURGICAL) ×1 IMPLANT
EVACUATOR SILICONE 100CC (DRAIN) IMPLANT
GLOVE BIO SURGEON STRL SZ 6 (GLOVE) ×6 IMPLANT
GLOVE BIOGEL PI IND STRL 7.0 (GLOVE) ×1 IMPLANT
GLOVE BIOGEL PI INDICATOR 7.0 (GLOVE) ×2
GLOVE SURG SS PI 6.5 STRL IVOR (GLOVE) ×3 IMPLANT
GLOVE SURG SS PI 7.0 STRL IVOR (GLOVE) ×6 IMPLANT
GOWN STRL REUS W/ TWL LRG LVL3 (GOWN DISPOSABLE) ×2 IMPLANT
GOWN STRL REUS W/TWL LRG LVL3 (GOWN DISPOSABLE) ×4
IMPL BREAST FULL RND 415 (Breast) ×2 IMPLANT
IMPL BRST FULL RND 415CC (Breast) ×2 IMPLANT
IMPLANT BREAST GEL 415CC (Breast) ×4 IMPLANT
IV NS 500ML (IV SOLUTION)
IV NS 500ML BAXH (IV SOLUTION) IMPLANT
KIT FILL SYSTEM UNIVERSAL (SET/KITS/TRAYS/PACK) IMPLANT
LINER CANISTER 1000CC FLEX (MISCELLANEOUS) ×3 IMPLANT
MARKER SKIN DUAL TIP RULER LAB (MISCELLANEOUS) IMPLANT
NDL SAFETY ECLIPSE 18X1.5 (NEEDLE) ×1 IMPLANT
NEEDLE HYPO 18GX1.5 SHARP (NEEDLE) ×2
NEEDLE HYPO 25X1 1.5 SAFETY (NEEDLE) IMPLANT
NS IRRIG 1000ML POUR BTL (IV SOLUTION) IMPLANT
PACK BASIN DAY SURGERY FS (CUSTOM PROCEDURE TRAY) ×3 IMPLANT
PAD ALCOHOL SWAB (MISCELLANEOUS) ×3 IMPLANT
PENCIL BUTTON HOLSTER BLD 10FT (ELECTRODE) ×3 IMPLANT
PIN SAFETY STERILE (MISCELLANEOUS) IMPLANT
PUNCH BIOPSY DERMAL 4MM (MISCELLANEOUS) IMPLANT
SHEET MEDIUM DRAPE 40X70 STRL (DRAPES) ×6 IMPLANT
SIZER BREAST REUSE 415CC (SIZER) ×2
SIZER BREAST REUSE 470CC (SIZER) ×3
SIZER BREAST REUSE SRF 450CC (SIZER) ×3
SIZER BRST REUSE 470CC (SIZER) ×1 IMPLANT
SIZER BRST REUSE P5.1X 415CC (SIZER) ×1 IMPLANT
SIZER BRST REUSE SRF 450CC (SIZER) ×1 IMPLANT
SLEEVE SCD COMPRESS KNEE MED (MISCELLANEOUS) ×3 IMPLANT
SPONGE LAP 18X18 X RAY DECT (DISPOSABLE) ×6 IMPLANT
STAPLER VISISTAT 35W (STAPLE) ×3 IMPLANT
SUT ETHILON 2 0 FS 18 (SUTURE) IMPLANT
SUT MNCRL AB 4-0 PS2 18 (SUTURE) ×6 IMPLANT
SUT PDS AB 2-0 CT2 27 (SUTURE) IMPLANT
SUT VIC AB 3-0 PS1 18 (SUTURE)
SUT VIC AB 3-0 PS1 18XBRD (SUTURE) IMPLANT
SUT VIC AB 3-0 SH 27 (SUTURE) ×4
SUT VIC AB 3-0 SH 27X BRD (SUTURE) ×2 IMPLANT
SUT VICRYL 4-0 PS2 18IN ABS (SUTURE) ×6 IMPLANT
SYR 10ML LL (SYRINGE) ×9 IMPLANT
SYR 50ML LL SCALE MARK (SYRINGE) ×12 IMPLANT
SYR BULB IRRIGATION 50ML (SYRINGE) ×6 IMPLANT
SYR CONTROL 10ML LL (SYRINGE) IMPLANT
SYR TB 1ML LL NO SAFETY (SYRINGE) IMPLANT
TOWEL OR 17X24 6PK STRL BLUE (TOWEL DISPOSABLE) ×6 IMPLANT
TUBE CONNECTING 20'X1/4 (TUBING) ×1
TUBE CONNECTING 20X1/4 (TUBING) ×2 IMPLANT
TUBING INFILTRATION IT-10001 (TUBING) ×3 IMPLANT
TUBING SET GRADUATE ASPIR 12FT (MISCELLANEOUS) ×3 IMPLANT
UNDERPAD 30X30 (UNDERPADS AND DIAPERS) ×6 IMPLANT
YANKAUER SUCT BULB TIP NO VENT (SUCTIONS) ×3 IMPLANT

## 2018-01-06 NOTE — Transfer of Care (Signed)
Immediate Anesthesia Transfer of Care Note  Patient: Nancy Austin  Procedure(s) Performed: REMOVAL OF BILATERAL TISSUE EXPANDERS WITH PLACEMENT OF BILATERAL BREAST IMPLANTS (Bilateral )  Patient Location: PACU  Anesthesia Type:General  Level of Consciousness: awake and alert   Airway & Oxygen Therapy: Patient Spontanous Breathing and Patient connected to nasal cannula oxygen  Post-op Assessment: Report given to RN and Post -op Vital signs reviewed and stable  Post vital signs: Reviewed and stable  Last Vitals:  Vitals:   01/06/18 0714  BP: 121/78  Pulse: 67  Resp: 18  Temp: 36.6 C  SpO2: 100%    Last Pain:  Vitals:   01/06/18 0714  TempSrc: Oral      Patients Stated Pain Goal: 1 (11/88/67 7373)  Complications: No apparent anesthesia complications

## 2018-01-06 NOTE — Anesthesia Procedure Notes (Signed)
Procedure Name: Intubation Date/Time: 01/06/2018 7:46 AM Performed by: Lieutenant Diego, CRNA Pre-anesthesia Checklist: Patient identified, Emergency Drugs available, Suction available and Patient being monitored Patient Re-evaluated:Patient Re-evaluated prior to induction Oxygen Delivery Method: Circle system utilized Preoxygenation: Pre-oxygenation with 100% oxygen Induction Type: IV induction Ventilation: Mask ventilation without difficulty Laryngoscope Size: Miller and 2 Grade View: Grade I Tube type: Oral Number of attempts: 1 Airway Equipment and Method: Stylet and Oral airway Placement Confirmation: ETT inserted through vocal cords under direct vision,  positive ETCO2 and breath sounds checked- equal and bilateral Secured at: 22 cm Tube secured with: Tape Dental Injury: Teeth and Oropharynx as per pre-operative assessment

## 2018-01-06 NOTE — Anesthesia Preprocedure Evaluation (Addendum)
Anesthesia Evaluation  Patient identified by MRN, date of birth, ID band Patient awake    Reviewed: Allergy & Precautions, NPO status , Patient's Chart, lab work & pertinent test results  Airway Mallampati: I  TM Distance: >3 FB Neck ROM: Full    Dental  (+) Teeth Intact, Dental Advisory Given   Pulmonary neg pulmonary ROS,    breath sounds clear to auscultation       Cardiovascular (-) Peripheral Vascular Disease  Rhythm:Regular Rate:Normal     Neuro/Psych  Neuromuscular disease    GI/Hepatic negative GI ROS, Neg liver ROS,   Endo/Other  negative endocrine ROS  Renal/GU negative Renal ROS     Musculoskeletal negative musculoskeletal ROS (+)   Abdominal   Peds  Hematology negative hematology ROS (+)   Anesthesia Other Findings Day of surgery medications reviewed with the patient.  Reproductive/Obstetrics                             Lab Results  Component Value Date   WBC 8.4 09/27/2017   HGB 10.1 (L) 09/27/2017   HCT 31.2 (L) 09/27/2017   MCV 91.0 09/27/2017   PLT 186 09/27/2017   Lab Results  Component Value Date   CREATININE 0.69 09/27/2017   BUN 5 (L) 09/27/2017   NA 139 09/27/2017   K 4.1 09/27/2017   CL 105 09/27/2017   CO2 29 09/27/2017   No results found for: INR, PROTIME  Echo: - Left ventricle: The cavity size was normal. Wall thickness was   normal. Systolic function was normal. The estimated ejection   fraction was in the range of 55% to 60%. Wall motion was normal;   there were no regional wall motion abnormalities. Left   ventricular diastolic function parameters were normal. Lateral s&'   10.7 cm/sec. GLS -22.9%. - Aortic valve: There was no stenosis. - Mitral valve: There was no significant regurgitation. - Left atrium: The atrium was mildly dilated. - Right ventricle: The cavity size was normal. Systolic function   was normal. - Right atrium: The atrium  was mildly dilated. - Tricuspid valve: Peak RV-RA gradient (S): 17 mm Hg. - Pulmonary arteries: PA peak pressure: 25 mm Hg (S). - Systemic veins: IVC measured 2.4 cm with < 50% respirophasic   variation, suggesting RA pressure 8 mmHg.  Anesthesia Physical  Anesthesia Plan  ASA: II  Anesthesia Plan: General   Post-op Pain Management:    Induction: Intravenous  PONV Risk Score and Plan: 4 or greater and Ondansetron, Dexamethasone, Midazolam, Scopolamine patch - Pre-op, Treatment may vary due to age or medical condition and Propofol infusion  Airway Management Planned: Oral ETT  Additional Equipment: None  Intra-op Plan:   Post-operative Plan: Extubation in OR  Informed Consent: I have reviewed the patients History and Physical, chart, labs and discussed the procedure including the risks, benefits and alternatives for the proposed anesthesia with the patient or authorized representative who has indicated his/her understanding and acceptance.   Dental advisory given  Plan Discussed with: CRNA  Anesthesia Plan Comments:       Anesthesia Quick Evaluation

## 2018-01-06 NOTE — Anesthesia Postprocedure Evaluation (Signed)
Anesthesia Post Note  Patient: Nancy Austin  Procedure(s) Performed: REMOVAL OF BILATERAL TISSUE EXPANDERS WITH PLACEMENT OF BILATERAL BREAST IMPLANTS (Bilateral )     Patient location during evaluation: PACU Anesthesia Type: General Level of consciousness: awake and alert Pain management: pain level controlled Vital Signs Assessment: post-procedure vital signs reviewed and stable Respiratory status: spontaneous breathing, nonlabored ventilation, respiratory function stable and patient connected to nasal cannula oxygen Cardiovascular status: blood pressure returned to baseline and stable Postop Assessment: no apparent nausea or vomiting Anesthetic complications: no    Last Vitals:  Vitals:   01/06/18 1045 01/06/18 1130  BP:  112/68  Pulse: 61 (!) 58  Resp: 19 18  Temp:  36.8 C  SpO2: 100% 100%    Last Pain:  Vitals:   01/06/18 1130  TempSrc:   PainSc: Sharon Neely Kammerer

## 2018-01-06 NOTE — Discharge Instructions (Signed)
12.5mg  Benadryl given at 7:50am 1,000mg  Tylenol given at  5mg  Oxycodone given at 11:15  Post Anesthesia Home Care Instructions  Activity: Get plenty of rest for the remainder of the day. A responsible individual must stay with you for 24 hours following the procedure.  For the next 24 hours, DO NOT: -Drive a car -Paediatric nurse -Drink alcoholic beverages -Take any medication unless instructed by your physician -Make any legal decisions or sign important papers.  Meals: Start with liquid foods such as gelatin or soup. Progress to regular foods as tolerated. Avoid greasy, spicy, heavy foods. If nausea and/or vomiting occur, drink only clear liquids until the nausea and/or vomiting subsides. Call your physician if vomiting continues.  Special Instructions/Symptoms: Your throat may feel dry or sore from the anesthesia or the breathing tube placed in your throat during surgery. If this causes discomfort, gargle with warm salt water. The discomfort should disappear within 24 hours.  If you had a scopolamine patch placed behind your ear for the management of post- operative nausea and/or vomiting:  1. The medication in the patch is effective for 72 hours, after which it should be removed.  Wrap patch in a tissue and discard in the trash. Wash hands thoroughly with soap and water. 2. You may remove the patch earlier than 72 hours if you experience unpleasant side effects which may include dry mouth, dizziness or visual disturbances. 3. Avoid touching the patch. Wash your hands with soap and water after contact with the patch.

## 2018-01-06 NOTE — Op Note (Signed)
Operative Note   DATE OF OPERATION: 1.25.19  LOCATION: [facility]-outpatient  SURGICAL DIVISION: Plastic Surgery  PREOPERATIVE DIAGNOSES:  1. History breast cancer 2. History therapeutic radiation 3. Acquired absence breasts  POSTOPERATIVE DIAGNOSES:  same  PROCEDURE:  Removal bilateral tissue expanders and placement silicone implants  SURGEON: Irene Limbo MD MBA  ASSISTANT: none  ANESTHESIA:  General.   EBL: 25 ml  COMPLICATIONS: None immediate.   INDICATIONS FOR PROCEDURE:  The patient, Nancy Austin, is a 42 y.o. female born on 01/10/1976, is here for implant placement following immediate expander based reconstruction, nipple sparing mastectomies, left latissimus flap, right acellular dermis.   FINDINGS: Complete incorporation right chest ADM. Natrelle Inspira Full Projection Smooth Round 415 ml implants placed bilateral. REF SRF-[id number] RIGHT SN [id number] LEFT SN [id number]  DESCRIPTION OF PROCEDURE:  The patient's operative site was marked with the patient in the preoperative area. The patient was taken to the operating room. SCDs were placed and IV antibiotics were given. The patient's operative site was prepped and draped in a sterile fashion. A time out was performed and all information was confirmed to be correct. Incision made in right inframammary fold scar and carried through superficial fascia to pectoralis muscle which was divided. Expander removed. Capsulotomies performed superiorly and medially. Anterior scoring capsule completed. Sizer placed. I then directed attention to left chest. Incision made in IMF scar and carried to latissimus muscle. Muscle divided in direction of fibers. Expander removed. Thermal capsulorraphy performed laterally. Sizer placed and patient brought to sitting position. Natrelle Inspira Full Projection 415 ml implants selected. Cavities irrigated with saline solution containing Ancef, gentamicin, and bacitracin. Hemostasis ensured. Local  anesthetic infiltrated. Cavities irrigated with Betadine solution. Implant placed in left chest,orientation ensured. Closure completed with 3-0 vicryl to close latissimus muscle, 3-0 vicryl to approximate superficial fascia mastectomy flap, 4-0 vicryl in dermis, 4-0 monocryl subcuticular skin. Over right chest, following implant placement, closure completed with 3-0 vicryl to approximate pectoralis muscle and superficial fascia. 4-0 vicryl used to close dermis followed by 4-0 monocryl subcuticular skin closure. Dermabond applied.   The patient was allowed to wake from anesthesia, extubated and taken to the recovery room in satisfactory condition.   SPECIMENS: none  DRAINS: none  Irene Limbo, MD [facility] [phone], pin [phone]

## 2018-01-06 NOTE — Interval H&P Note (Signed)
History and Physical Interval Note:  01/06/2018 6:53 AM  Nancy Austin  has presented today for surgery, with the diagnosis of history of breast cancer  The various methods of treatment have been discussed with the patient and family. After consideration of risks, benefits and other options for treatment, the patient has consented to  Procedure(s): REMOVAL OF BILATERAL TISSUE EXPANDERS WITH PLACEMENT OF BILATERAL BREAST IMPLANTS (Bilateral) POSSIBLE LIPOFILLING (Bilateral) as a surgical intervention .  The patient's history has been reviewed, patient examined, no change in status, stable for surgery.  I have reviewed the patient's chart and labs.  Questions were answered to the patient's satisfaction.     [name]

## 2018-01-09 ENCOUNTER — Encounter (HOSPITAL_BASED_OUTPATIENT_CLINIC_OR_DEPARTMENT_OTHER): Payer: Self-pay | Admitting: Plastic Surgery

## 2018-01-13 DIAGNOSIS — C50912 Malignant neoplasm of unspecified site of left female breast: Secondary | ICD-10-CM | POA: Diagnosis not present

## 2018-01-28 ENCOUNTER — Other Ambulatory Visit: Payer: Self-pay | Admitting: Oncology

## 2018-02-07 DIAGNOSIS — M2550 Pain in unspecified joint: Secondary | ICD-10-CM | POA: Diagnosis not present

## 2018-02-07 DIAGNOSIS — L601 Onycholysis: Secondary | ICD-10-CM | POA: Diagnosis not present

## 2018-02-07 DIAGNOSIS — C50912 Malignant neoplasm of unspecified site of left female breast: Secondary | ICD-10-CM | POA: Diagnosis not present

## 2018-02-07 DIAGNOSIS — Z6821 Body mass index (BMI) 21.0-21.9, adult: Secondary | ICD-10-CM | POA: Diagnosis not present

## 2018-02-07 DIAGNOSIS — Z170 Estrogen receptor positive status [ER+]: Secondary | ICD-10-CM | POA: Diagnosis not present

## 2018-02-07 DIAGNOSIS — Z8261 Family history of arthritis: Secondary | ICD-10-CM | POA: Diagnosis not present

## 2018-02-14 ENCOUNTER — Encounter: Payer: Self-pay | Admitting: Oncology

## 2018-03-01 DIAGNOSIS — Z9221 Personal history of antineoplastic chemotherapy: Secondary | ICD-10-CM | POA: Diagnosis not present

## 2018-03-01 DIAGNOSIS — Z853 Personal history of malignant neoplasm of breast: Secondary | ICD-10-CM | POA: Diagnosis not present

## 2018-03-01 DIAGNOSIS — N939 Abnormal uterine and vaginal bleeding, unspecified: Secondary | ICD-10-CM | POA: Diagnosis not present

## 2018-03-07 DIAGNOSIS — Z79810 Long term (current) use of selective estrogen receptor modulators (SERMs): Secondary | ICD-10-CM | POA: Diagnosis not present

## 2018-03-07 DIAGNOSIS — N939 Abnormal uterine and vaginal bleeding, unspecified: Secondary | ICD-10-CM | POA: Diagnosis not present

## 2018-03-07 DIAGNOSIS — N950 Postmenopausal bleeding: Secondary | ICD-10-CM | POA: Diagnosis not present

## 2018-03-31 ENCOUNTER — Encounter: Payer: 59 | Admitting: Family Medicine

## 2018-04-14 ENCOUNTER — Ambulatory Visit: Payer: 59 | Admitting: Family Medicine

## 2018-05-01 ENCOUNTER — Ambulatory Visit (INDEPENDENT_AMBULATORY_CARE_PROVIDER_SITE_OTHER): Payer: 59 | Admitting: Family Medicine

## 2018-05-01 ENCOUNTER — Other Ambulatory Visit: Payer: Self-pay

## 2018-05-01 ENCOUNTER — Encounter: Payer: Self-pay | Admitting: Family Medicine

## 2018-05-01 VITALS — BP 102/66 | HR 47 | Temp 98.2°F | Resp 15 | Ht 65.0 in | Wt 127.6 lb

## 2018-05-01 DIAGNOSIS — G620 Drug-induced polyneuropathy: Secondary | ICD-10-CM | POA: Diagnosis not present

## 2018-05-01 DIAGNOSIS — E559 Vitamin D deficiency, unspecified: Secondary | ICD-10-CM

## 2018-05-01 DIAGNOSIS — D701 Agranulocytosis secondary to cancer chemotherapy: Secondary | ICD-10-CM

## 2018-05-01 DIAGNOSIS — Z0000 Encounter for general adult medical examination without abnormal findings: Secondary | ICD-10-CM

## 2018-05-01 DIAGNOSIS — T451X5A Adverse effect of antineoplastic and immunosuppressive drugs, initial encounter: Secondary | ICD-10-CM | POA: Diagnosis not present

## 2018-05-01 LAB — BASIC METABOLIC PANEL
BUN: 13 mg/dL (ref 6–23)
CHLORIDE: 105 meq/L (ref 96–112)
CO2: 26 mEq/L (ref 19–32)
Calcium: 9.5 mg/dL (ref 8.4–10.5)
Creatinine, Ser: 0.67 mg/dL (ref 0.40–1.20)
GFR: 102.43 mL/min (ref >60.00)
GLUCOSE: 90 mg/dL (ref 70–99)
POTASSIUM: 4.2 meq/L (ref 3.5–5.1)
Sodium: 141 mEq/L (ref 135–145)

## 2018-05-01 LAB — HEPATIC FUNCTION PANEL
ALK PHOS: 31 U/L — AB (ref 39–117)
ALT: 12 U/L (ref 0–35)
AST: 19 U/L (ref 0–37)
Albumin: 4.3 g/dL (ref 3.5–5.2)
Bilirubin, Direct: 0.1 mg/dL (ref 0.0–0.3)
TOTAL PROTEIN: 6.8 g/dL (ref 6.0–8.3)
Total Bilirubin: 0.5 mg/dL (ref 0.2–1.2)

## 2018-05-01 LAB — CBC WITH DIFFERENTIAL/PLATELET
BASOS ABS: 0 10*3/uL (ref 0.0–0.1)
Basophils Relative: 1 % (ref 0.0–3.0)
EOS ABS: 0 10*3/uL (ref 0.0–0.7)
Eosinophils Relative: 0.8 % (ref 0.0–5.0)
HEMATOCRIT: 38.1 % (ref 36.0–46.0)
Hemoglobin: 12.5 g/dL (ref 12.0–15.0)
Lymphocytes Relative: 35.9 % (ref 12.0–46.0)
Lymphs Abs: 1.2 10*3/uL (ref 0.7–4.0)
MCHC: 32.7 g/dL (ref 30.0–36.0)
MCV: 87.8 fl (ref 78.0–100.0)
MONO ABS: 0.2 10*3/uL (ref 0.1–1.0)
Monocytes Relative: 4.8 % (ref 3.0–12.0)
NEUTROS ABS: 1.9 10*3/uL (ref 1.4–7.7)
Neutrophils Relative %: 57.5 % (ref 43.0–77.0)
Platelets: 204 10*3/uL (ref 150.0–400.0)
RBC: 4.34 Mil/uL (ref 3.87–5.11)
RDW: 14.6 % (ref 11.5–15.5)
WBC: 3.3 10*3/uL — ABNORMAL LOW (ref 4.0–10.5)

## 2018-05-01 LAB — LIPID PANEL
CHOLESTEROL: 128 mg/dL (ref 0–200)
HDL: 52.5 mg/dL (ref >39.00)
LDL CALC: 60 mg/dL (ref 0–99)
NonHDL: 75.64
TRIGLYCERIDES: 79 mg/dL (ref 0.0–149.0)
Total CHOL/HDL Ratio: 2
VLDL: 15.8 mg/dL (ref 0.0–40.0)

## 2018-05-01 LAB — TSH: TSH: 2.12 u[IU]/mL (ref 0.35–4.50)

## 2018-05-01 LAB — VITAMIN D 25 HYDROXY (VIT D DEFICIENCY, FRACTURES): VITD: 28.4 ng/mL — AB (ref 30.00–100.00)

## 2018-05-01 NOTE — Assessment & Plan Note (Signed)
Stable on Gabapentin.

## 2018-05-01 NOTE — Assessment & Plan Note (Signed)
Repeat CBC today 

## 2018-05-01 NOTE — Patient Instructions (Addendum)
Follow up in 1 year or as needed We'll notify you of your lab results and make any changes if needed Keep up the good work!  You look great! Call with any questions or concerns Have a great summer!!! 

## 2018-05-01 NOTE — Progress Notes (Signed)
   Subjective:    Patient ID: Nancy Austin, female    DOB: September 08, 1976, 42 y.o.   MRN: [id number]  HPI CPE- No longer needs mammo due to bilateral mastectomy, UTD on pap w/ Dr Marvel Plan.  UTD on immunizations.  Down 11 lbs since last visit.   Review of Systems Patient reports no vision/ hearing changes, adenopathy,fever, weight change,  persistant/recurrent hoarseness , swallowing issues, chest pain, palpitations, edema, persistant/recurrent cough, hemoptysis, dyspnea (rest/exertional/paroxysmal nocturnal), gastrointestinal bleeding (melena, rectal bleeding), abdominal pain, significant heartburn, bowel changes, GU symptoms (dysuria, hematuria, incontinence), Gyn symptoms (abnormal  bleeding, pain),  syncope, focal weakness, memory loss, numbness & tingling, skin/hair/nail changes, abnormal bruising or bleeding, anxiety, or depression.     Objective:   Physical Exam General Appearance:    Alert, cooperative, no distress, appears stated age  Head:    Normocephalic, without obvious abnormality, atraumatic  Eyes:    PERRL, conjunctiva/corneas clear, EOM's intact, fundi    benign, both eyes  Ears:    Normal TM's and external ear canals, both ears  Nose:   Nares normal, septum midline, mucosa normal, no drainage    or sinus tenderness  Throat:   Lips, mucosa, and tongue normal; teeth and gums normal  Neck:   Supple, symmetrical, trachea midline, no adenopathy;    Thyroid: no enlargement/tenderness/nodules  Back:     Symmetric, no curvature, ROM normal, no CVA tenderness  Lungs:     Clear to auscultation bilaterally, respirations unlabored  Chest Wall:    No tenderness or deformity   Heart:    Regular rate and rhythm, S1 and S2 normal, no murmur, rub   or gallop  Breast Exam:    Deferred to GYN  Abdomen:     Soft, non-tender, bowel sounds active all four quadrants,    no masses, no organomegaly  Genitalia:    Deferred to GYN  Rectal:    Extremities:   Extremities normal, atraumatic, no  cyanosis or edema  Pulses:   2+ and symmetric all extremities  Skin:   Skin color, texture, turgor normal, no rashes or lesions  Lymph nodes:   Cervical, supraclavicular, and axillary nodes normal  Neurologic:   CNII-XII intact, normal strength, sensation and reflexes    throughout          Assessment & Plan:

## 2018-05-01 NOTE — Assessment & Plan Note (Signed)
Pt's PE WNL.  UTD on immunizations, pap.  No need for mammo due to bilateral mastectomy.  Check labs.  Anticipatory guidance provided.

## 2018-05-02 ENCOUNTER — Other Ambulatory Visit: Payer: Self-pay | Admitting: General Practice

## 2018-05-02 MED ORDER — VITAMIN D (ERGOCALCIFEROL) 1.25 MG (50000 UNIT) PO CAPS: 50000.0000 [IU] | ORAL_CAPSULE | ORAL | 0 refills | Status: DC

## 2018-05-15 DIAGNOSIS — Z79810 Long term (current) use of selective estrogen receptor modulators (SERMs): Secondary | ICD-10-CM | POA: Diagnosis not present

## 2018-05-15 DIAGNOSIS — N939 Abnormal uterine and vaginal bleeding, unspecified: Secondary | ICD-10-CM | POA: Diagnosis not present

## 2018-05-15 DIAGNOSIS — N950 Postmenopausal bleeding: Secondary | ICD-10-CM | POA: Diagnosis not present

## 2018-05-15 DIAGNOSIS — Z3202 Encounter for pregnancy test, result negative: Secondary | ICD-10-CM | POA: Diagnosis not present

## 2018-05-15 DIAGNOSIS — Z853 Personal history of malignant neoplasm of breast: Secondary | ICD-10-CM | POA: Diagnosis not present

## 2018-05-15 DIAGNOSIS — N840 Polyp of corpus uteri: Secondary | ICD-10-CM | POA: Diagnosis not present

## 2018-08-07 DIAGNOSIS — Z8261 Family history of arthritis: Secondary | ICD-10-CM | POA: Diagnosis not present

## 2018-08-07 DIAGNOSIS — M2550 Pain in unspecified joint: Secondary | ICD-10-CM | POA: Diagnosis not present

## 2018-08-07 DIAGNOSIS — Z681 Body mass index (BMI) 19 or less, adult: Secondary | ICD-10-CM | POA: Diagnosis not present

## 2018-08-07 DIAGNOSIS — Z170 Estrogen receptor positive status [ER+]: Secondary | ICD-10-CM | POA: Diagnosis not present

## 2018-08-07 DIAGNOSIS — C50912 Malignant neoplasm of unspecified site of left female breast: Secondary | ICD-10-CM | POA: Diagnosis not present

## 2018-08-07 DIAGNOSIS — L601 Onycholysis: Secondary | ICD-10-CM | POA: Diagnosis not present

## 2018-08-09 ENCOUNTER — Ambulatory Visit: Payer: 59 | Admitting: Oncology

## 2018-08-09 ENCOUNTER — Encounter: Payer: Self-pay | Admitting: Adult Health

## 2018-08-09 ENCOUNTER — Inpatient Hospital Stay (HOSPITAL_BASED_OUTPATIENT_CLINIC_OR_DEPARTMENT_OTHER): Payer: 59 | Admitting: Adult Health

## 2018-08-09 ENCOUNTER — Other Ambulatory Visit: Payer: 59

## 2018-08-09 ENCOUNTER — Telehealth: Payer: Self-pay | Admitting: Adult Health

## 2018-08-09 ENCOUNTER — Inpatient Hospital Stay: Payer: 59 | Attending: Adult Health

## 2018-08-09 VITALS — BP 133/89 | HR 48 | Temp 97.6°F | Resp 18 | Ht 65.0 in | Wt 121.9 lb

## 2018-08-09 DIAGNOSIS — N63 Unspecified lump in unspecified breast: Secondary | ICD-10-CM

## 2018-08-09 DIAGNOSIS — C50412 Malignant neoplasm of upper-outer quadrant of left female breast: Secondary | ICD-10-CM | POA: Diagnosis not present

## 2018-08-09 DIAGNOSIS — Z170 Estrogen receptor positive status [ER+]: Secondary | ICD-10-CM | POA: Diagnosis not present

## 2018-08-09 DIAGNOSIS — Z9013 Acquired absence of bilateral breasts and nipples: Secondary | ICD-10-CM | POA: Insufficient documentation

## 2018-08-09 DIAGNOSIS — Z79810 Long term (current) use of selective estrogen receptor modulators (SERMs): Secondary | ICD-10-CM | POA: Insufficient documentation

## 2018-08-09 DIAGNOSIS — Z79899 Other long term (current) drug therapy: Secondary | ICD-10-CM | POA: Insufficient documentation

## 2018-08-09 DIAGNOSIS — Z171 Estrogen receptor negative status [ER-]: Secondary | ICD-10-CM | POA: Diagnosis not present

## 2018-08-09 LAB — COMPREHENSIVE METABOLIC PANEL
ALK PHOS: 41 U/L (ref 38–126)
ALT: 12 U/L (ref 0–44)
AST: 22 U/L (ref 15–41)
Albumin: 4.2 g/dL (ref 3.5–5.0)
Anion gap: 8 (ref 5–15)
BILIRUBIN TOTAL: 0.5 mg/dL (ref 0.3–1.2)
BUN: 11 mg/dL (ref 6–20)
CALCIUM: 9.5 mg/dL (ref 8.9–10.3)
CO2: 28 mmol/L (ref 22–32)
Chloride: 106 mmol/L (ref 98–111)
Creatinine, Ser: 0.78 mg/dL (ref 0.44–1.00)
GFR calc Af Amer: >60 mL/min (ref >60)
GLUCOSE: 90 mg/dL (ref 70–99)
Potassium: 4.2 mmol/L (ref 3.5–5.1)
Sodium: 142 mmol/L (ref 135–145)
TOTAL PROTEIN: 6.7 g/dL (ref 6.5–8.1)

## 2018-08-09 LAB — CBC WITH DIFFERENTIAL/PLATELET
BASOS ABS: 0 10*3/uL (ref 0.0–0.1)
BASOS PCT: 1 %
EOS PCT: 2 %
Eosinophils Absolute: 0.1 10*3/uL (ref 0.0–0.5)
HEMATOCRIT: 37.2 % (ref 34.8–46.6)
Hemoglobin: 12.3 g/dL (ref 11.6–15.9)
LYMPHS PCT: 39 %
Lymphs Abs: 1.1 10*3/uL (ref 0.9–3.3)
MCH: 29.2 pg (ref 25.1–34.0)
MCHC: 33.1 g/dL (ref 31.5–36.0)
MCV: 88.2 fL (ref 79.5–101.0)
MONO ABS: 0.2 10*3/uL (ref 0.1–0.9)
Monocytes Relative: 7 %
NEUTROS ABS: 1.4 10*3/uL — AB (ref 1.5–6.5)
Neutrophils Relative %: 51 %
PLATELETS: 177 10*3/uL (ref 145–400)
RBC: 4.21 MIL/uL (ref 3.70–5.45)
RDW: 14 % (ref 11.2–14.5)
WBC: 2.8 10*3/uL — AB (ref 3.9–10.3)

## 2018-08-09 MED ORDER — GABAPENTIN 300 MG PO CAPS: ORAL_CAPSULE | ORAL | 4 refills | Status: DC

## 2018-08-09 MED ORDER — TAMOXIFEN CITRATE 20 MG PO TABS: 20.0000 mg | ORAL_TABLET | Freq: Every day | ORAL | 4 refills | Status: DC

## 2018-08-09 NOTE — Telephone Encounter (Signed)
Gave patient avs, patient did not need calendar.  My chart active.

## 2018-08-09 NOTE — Progress Notes (Signed)
[place]  Telephone:([phone]) [phone] Fax:(336) [phone]     ID: Nancy Austin DOB: November 02, 1976  MR#: [id number]  EFE#:[id number]  Patient Care Team: Midge Minium, MD as PCP - General (Family Medicine) Rolm Bookbinder, MD as Consulting Physician (General Surgery) Gery Pray, MD as Consulting Physician (Radiation Oncology) Magrinat, Virgie Dad, MD as Consulting Physician (Oncology) Sylvan Cheese, NP as Nurse Practitioner (Hematology and Oncology) Paula Compton, MD as Consulting Physician (Obstetrics and Gynecology) OTHER MD:  CHIEF COMPLAINT: Triple positive breast cancer  CURRENT TREATMENT:  tamoxifen   BREAST CANCER HISTORY: From the original Intake note:  Nancy Austin herself found a lump in her left breast early October 2015 and brought it to her gynecologist attention. On 09/20/2014 she was set up for bilateral diagnostic mammography and left breast ultrasonography of the breast Center. This was the patient's first ever mammogram. In the area of concern in the left breast there was suspicious pleomorphic calcifications spanning 1.1 cm. There was no discrete mammographic mass in this dense breasts (category C.). On physical exam, there was a palpable firm at 1.5 cm mass at the 3:00 position in the left breast. By ultrasound this was irregular and hypoechoic and measured 1.8 cm. Ultrasound of the left axilla was unremarkable. Aside from multiple cysts in the left breast there were no other findings of concern.  On the same day, 09/20/2014, the patient underwent biopsy of the left breast palpable mass. This showed ([phone]) an invasive ductal carcinoma, grade 2, estrogen receptor 100% positive, progesterone receptor 89% positive, both with strong staining intensity, with an MIB-1 of 16%. HER-2 was amplified with a signals ratio of 5.07 and a copy number per cell of 6.85.   On 09/30/2014 the patient underwent bilateral breast MRIs. This showed a 1.7  cm mass in the upper outer quadrant of the left breast. There was no other suspicious finding in either breast and no abnormal appearing adenopathy.  The patient's subsequent history is as detailed below.  INTERVAL HISTORY: Nancy Austin returns today for follow up of her left sided breast cancers, the first diagnosed in 2015 as triple positive, and the second diagnosed in 2018 which was ER negative DCIS.  She has undergone mastectomies and reconstruction with implant placement.  She continues on Tamoxifen daily which she is tolerating well.    REVIEW OF SYSTEMS: Nancy Austin is doing well.  She sees gyn regularly.  She also has PCP who she sees regularly.  She is planning on setting up an appointment with dermatology.  Nancy Austin notes a lump on the right side of her breast. She first noted this about 2 weeks ago.  I will note she did undergo bilateral mastectomies and reconstruction.  The right was a mastectomy with silicone implant, the left, a latissimus flap reconstruction.   Nancy Austin exercises daily, either with running or walking.  She occasionally lifts weights.    Nancy Austin is feeling well otherwise, and a detailed ROS is otherwise non contributory.     PAST MEDICAL HISTORY: Past Medical History:  Diagnosis Date  . Anemia   . Benign skin lesion of forehead 10/2015  . Breast cancer ([place])   . Cancer ([place])   . History of breast cancer 09/2014   left  . History of chemotherapy    finished 02/10/2015  . History of radiation therapy 04/16/2015 - 06/02/2015  . Personal history of radiation therapy     PAST SURGICAL HISTORY: Past Surgical History:  Procedure Laterality Date  . BREAST LUMPECTOMY    .  BREAST RECONSTRUCTION WITH PLACEMENT OF TISSUE EXPANDER AND FLEX HD (ACELLULAR HYDRATED DERMIS) Right 09/26/2017   Procedure: RIGHT BREAST RECONSTRUCTION WITH PLACEMENT OF TISSUE EXPANDER AND FLEX HD (ACELLULAR HYDRATED DERMIS);  Surgeon: Irene Limbo, MD;  Location: [facility];  Service: Plastics;   Laterality: Right;  . LAPAROSCOPIC OOPHERECTOMY Bilateral   . LAPAROSCOPIC SALPINGO OOPHERECTOMY Bilateral 11/12/2015   Procedure: LAPAROSCOPIC BILATERAL SALPINGO OOPHORECTOMY;  Surgeon: Paula Compton, MD;  Location: [facility];  Service: Gynecology;  Laterality: Bilateral;  . LATISSIMUS FLAP TO BREAST Left 09/26/2017   Procedure: LATISSIMUS FLAP TO LEFT BREAST;  Surgeon: Irene Limbo, MD;  Location: [facility];  Service: Plastics;  Laterality: Left;  . LESION REMOVAL Left 10/22/2015   Procedure: EXCISION OF FOREHEAD LESION 1X1CM;  Surgeon: Rolm Bookbinder, MD;  Location: [facility];  Service: General;  Laterality: Left;  . NIPPLE SPARING MASTECTOMY Bilateral 09/26/2017   Procedure: LEFT NIPPLE SPARING MASTECTOMY, RIGHT PROPHYLACTIC NIPPLE SPARING MASTECTOMY;  Surgeon: Rolm Bookbinder, MD;  Location: [facility];  Service: General;  Laterality: Bilateral;  . PORT-A-CATH REMOVAL N/A 10/22/2015   Procedure: REMOVAL PORT-A-CATH;  Surgeon: Rolm Bookbinder, MD;  Location: [facility];  Service: General;  Laterality: N/A;  . PORTACATH PLACEMENT N/A 10/15/2014   Procedure: INSERTION PORT-A-CATH;  Surgeon: Rolm Bookbinder, MD;  Location: [facility];  Service: General;  Laterality: N/A;  . RADIOACTIVE SEED GUIDED EXCISIONAL BREAST BIOPSY Left 08/17/2017   Procedure: LEFT RADIOACTIVE SEED GUIDED EXCISIONAL BREAST BIOPSY ERAS PATHWAY;  Surgeon: Rolm Bookbinder, MD;  Location: [facility];  Service: General;  Laterality: Left;  . RADIOACTIVE SEED GUIDED PARTIAL MASTECTOMY WITH AXILLARY SENTINEL LYMPH NODE BIOPSY Left 03/06/2015   Procedure: RADIOACTIVE SEED GUIDED PARTIAL MASTECTOMY WITH AXILLARY SENTINEL LYMPH NODE BIOPSY;  Surgeon: Rolm Bookbinder, MD;  Location: [facility];  Service: General;  Laterality: Left;  . REMOVAL OF BILATERAL TISSUE EXPANDERS WITH PLACEMENT OF BILATERAL BREAST IMPLANTS Bilateral 01/06/2018   Procedure: REMOVAL OF BILATERAL  TISSUE EXPANDERS WITH PLACEMENT OF BILATERAL BREAST IMPLANTS;  Surgeon: Irene Limbo, MD;  Location: [facility];  Service: Plastics;  Laterality: Bilateral;  . TONSILLECTOMY  1996    FAMILY HISTORY Family History  Problem Relation Age of Onset  . Skin cancer Father   . Hyperlipidemia Father   . Prostate cancer Father   . Prostate cancer Maternal Uncle 5       currently 20  . Multiple myeloma Paternal Grandmother 61       Deceased 28  . Cancer Paternal Grandmother        bone  . Prostate cancer Paternal Uncle   . Lung disease Maternal Grandmother   . AAA (abdominal aortic aneurysm) Maternal Grandmother   . Stroke Maternal Grandfather   The patient's parents are both living. The patient has one brother, no sisters. One grandmother was diagnosed with multiple myeloma at age 92. There is no history of breast or ovarian cancer in the family.   GYNECOLOGIC HISTORY:  Patient's last menstrual period was 02/22/2015.  Menarche age 46, first live birth age 53. The patient is GX P2. She was still having regular periods at the start of chemotherapy. She was on birth control pills on and off for the last 15 years, stopping in October of 2015.   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 Her husband Rolla Plate works as a Immunologist at [employer]. Their children are aged 89, and 5 as of August 2018  ADVANCED DIRECTIVES: In place   HEALTH MAINTENANCE: Social History   Tobacco Use  . Smoking status: Never Smoker  . Smokeless tobacco: Never Used  Substance Use Topics  . Alcohol use: Yes    Comment: 3 x/week  . Drug use: No     Colonoscopy:  PAP:  November 2014  Bone density:  Lipid panel:  Allergies  Allergen Reactions  . Chlorhexidine Itching and Rash  . Morphine And Related Nausea And Vomiting  . Other Rash    Dermabond  . Tegaderm Ag Mesh [Silver] Rash    Reports that the larger tegaderm causes rashes    Current Outpatient  Medications  Medication Sig Dispense Refill  . diphenhydrAMINE (BENADRYL) 25 MG tablet Take 25 mg by mouth at bedtime as needed for sleep. Reported on 06/07/2016    . gabapentin (NEURONTIN) 300 MG capsule TAKE 1 CAPSULE BY MOUTH DAILY AT BEDTIME 90 capsule 2  . meloxicam (MOBIC) 15 MG tablet TAKE 1 TABLET BY MOUTH EVERY DAY (Patient taking differently: Take 15 mg by mouth at bedtime) 30 tablet 1  . Multiple Vitamin (MULTI-VITAMINS) TABS Take by mouth.    . tamoxifen (NOLVADEX) 20 MG tablet TAKE 1 TABLET BY MOUTH DAILY 90 tablet 2   No current facility-administered medications for this visit.     OBJECTIVE:  Vitals:   08/09/18 0953  BP: 133/89  Pulse: (!) 48  Resp: 18  Temp: 97.6 F (36.4 C)  SpO2: 100%     Body mass index is 20.29 kg/m.    ECOG FS:0 - Asymptomatic   Filed Weights   08/09/18 0953  Weight: 121 lb 14.4 oz (55.3 kg)  GENERAL: Patient is a well appearing female in no acute distress HEENT:  Sclerae anicteric.  Oropharynx clear and moist. No ulcerations or evidence of oropharyngeal candidiasis. Neck is supple.  NODES:  No cervical, supraclavicular, or axillary lymphadenopathy palpated.  BREAST EXAM:  Right breast s/p mastectomy and reconstruction with small 12m nodular area in upper outer quadrant, no other nodules, masses, swelling noted, left breast s/p mastectomy and reconstruction, no nodules, masses noted LUNGS:  Clear to auscultation bilaterally.  No wheezes or rhonchi. HEART:  Regular rate and rhythm. No murmur appreciated. ABDOMEN:  Soft, nontender.  Positive, normoactive bowel sounds. No organomegaly palpated. MSK:  No focal spinal tenderness to palpation. Full range of motion bilaterally in the upper extremities. EXTREMITIES:  No peripheral edema.   SKIN:  Clear with no obvious rashes or skin changes. No nail dyscrasia. NEURO:  Nonfocal. Well oriented.  Appropriate affect.    LAB RESULTS:  CMP     Component Value Date/Time   NA 141 05/01/2018 1125   NA  141 08/02/2017 1536   K 4.2 05/01/2018 1125   K 4.0 08/02/2017 1536   CL 105 05/01/2018 1125   CO2 26 05/01/2018 1125   CO2 32 (H) 08/02/2017 1536   GLUCOSE 90 05/01/2018 1125   GLUCOSE 93 08/02/2017 1536   BUN 13 05/01/2018 1125   BUN 11.4 08/02/2017 1536   CREATININE 0.67 05/01/2018 1125   CREATININE 0.8 08/02/2017 1536   CALCIUM 9.5 05/01/2018 1125   CALCIUM 9.8 08/02/2017 1536   PROT 6.8 05/01/2018 1125   PROT 6.7 08/02/2017 1536   ALBUMIN 4.3 05/01/2018 1125   ALBUMIN 3.9 08/02/2017 1536   AST 19 05/01/2018 1125   AST 22 08/02/2017 1536   ALT 12 05/01/2018 1125   ALT 12 08/02/2017 1536   ALKPHOS 31 (L) 05/01/2018 1125  ALKPHOS 51 08/02/2017 1536   BILITOT 0.5 05/01/2018 1125   BILITOT 0.31 08/02/2017 1536   GFRNONAA >60 09/27/2017 0706   GFRAA >60 09/27/2017 0706    I No results found for: SPEP  Lab Results  Component Value Date   WBC 2.8 (L) 08/09/2018   NEUTROABS 1.4 (L) 08/09/2018   HGB 12.3 08/09/2018   HCT 37.2 08/09/2018   MCV 88.2 08/09/2018   PLT 177 08/09/2018      Chemistry      Component Value Date/Time   NA 141 05/01/2018 1125   NA 141 08/02/2017 1536   K 4.2 05/01/2018 1125   K 4.0 08/02/2017 1536   CL 105 05/01/2018 1125   CO2 26 05/01/2018 1125   CO2 32 (H) 08/02/2017 1536   BUN 13 05/01/2018 1125   BUN 11.4 08/02/2017 1536   CREATININE 0.67 05/01/2018 1125   CREATININE 0.8 08/02/2017 1536      Component Value Date/Time   CALCIUM 9.5 05/01/2018 1125   CALCIUM 9.8 08/02/2017 1536   ALKPHOS 31 (L) 05/01/2018 1125   ALKPHOS 51 08/02/2017 1536   AST 19 05/01/2018 1125   AST 22 08/02/2017 1536   ALT 12 05/01/2018 1125   ALT 12 08/02/2017 1536   BILITOT 0.5 05/01/2018 1125   BILITOT 0.31 08/02/2017 1536       No results found for: LABCA2  No components found for: LABCA125  No results for input(s): INR in the last 168 hours.  Urinalysis No results found for: COLORURINE  STUDIES: No results found.   ASSESSMENT: 42 y.o.  BRCA negative [place] woman s/p Left breast upper outer quadrant biopsy 09/20/2014, for a clinical T1c N0, stage IA invasive ductal carcinoma, grade 2, estrogen and progesterone receptor positive, HER-2 amplified with a signal is ratio of 5.07, and an MIB-1 of 16%  (1) neoadjuvant treatment consisting of carboplatin, docetaxel, trastuzumab and pertuzumab given every 21 days x6,  completed 02/10/2015  (a) breast MRI 02/11/2015 showed a complete radiologic response  (2) left lumpectomy and sentinel lymph node sampling 03/06/2015 showed a residual ypT1c ypN1a, stage IIA invasive ductal carcinoma, grade 1, with repeat prognostic panel still triple positive, and negative margins  (3) trastuzumab continued to total one year (last dose 10/01/2015)  (a) final echo 08/07/2015 finds an ejection fraction of 55-60%.  (4) adjuvant radiation 04/16/2015-06/02/2015 Left breast 50.4 gray in 28 fractions, axillary/supraclavicular region 45 gray in 25 fractions, lumpectomy boost 10 gray in 5 fractions  (5) started tamoxifen 06/26/2015  (a) goserelin started 04/03/2015, last dose 10/06/2015  (b) laparoscopic bilateral salpingo-oophorectomy 11/12/2015 with benign pathology  (6)  the BreastNext geneprofile  ([facility] genetics) obtained November 2015 did not reveal a mutation in ATM, BARD1, BRCA1, BRCA BRIP1, CDH1, CHEK2, MRE11A, MUTYH, NBN, NF1, PALB2, PTEN, RAD50, RAD51C, RAD51D, or TP53  (7) left lumpectomy 08/17/2017 showed ductal carcinoma in situ, grade 2, estrogen and progesterone receptor negative, with close though negative margins  (8) bilateral mastectomies 09/26/2017 showed  (a) on the right, no malignancy  (b) on the left, ductal carcinoma in situ, grade 2, with a close but negative anterior margin (skin)    PLAN: [place] is doing well.  She is tolerating the Tamoxifen well and will continue this.  Due to the right upper outer quadrant nodule noted on exam, I ordered an MRI to evaluate her chest wall,  breasts, and implant integrity.  I recommended that she continue healthy diet, exercise.  We reviewed cancer screening recommendations which include GYN cancer  screening, skin cancer screening.    I sent in a refill of tamoxifen for her to her pharmacy.  She will return in 1 year for labs and continued surveillance per protocol.  She will see Dr. Donne Austin in February.    Niralya knows to call for any questions or concerns prior to her next appointment with [place].    A total of (30) minutes of face-to-face time was spent with this patient with greater than 50% of that time in counseling and care-coordination.     Wilber Bihari, NP 08/09/18 10:14 AM Medical Oncology and Hematology [facility] [address] [place], [place] [address] Tel. [phone]    Fax. [phone]

## 2018-08-15 ENCOUNTER — Ambulatory Visit (HOSPITAL_COMMUNITY)
Admission: RE | Admit: 2018-08-15 | Discharge: 2018-08-15 | Disposition: A | Discharge status: Home / Self Care | Payer: 59 | Source: Ambulatory Visit | Attending: Adult Health | Admitting: Adult Health

## 2018-08-15 DIAGNOSIS — N6489 Other specified disorders of breast: Secondary | ICD-10-CM | POA: Diagnosis not present

## 2018-08-15 DIAGNOSIS — Z9882 Breast implant status: Secondary | ICD-10-CM | POA: Insufficient documentation

## 2018-08-15 DIAGNOSIS — N63 Unspecified lump in unspecified breast: Secondary | ICD-10-CM | POA: Diagnosis present

## 2018-08-15 DIAGNOSIS — N6321 Unspecified lump in the left breast, upper outer quadrant: Secondary | ICD-10-CM | POA: Insufficient documentation

## 2018-08-15 DIAGNOSIS — C50412 Malignant neoplasm of upper-outer quadrant of left female breast: Secondary | ICD-10-CM | POA: Insufficient documentation

## 2018-08-15 DIAGNOSIS — Z853 Personal history of malignant neoplasm of breast: Secondary | ICD-10-CM | POA: Diagnosis not present

## 2018-08-15 DIAGNOSIS — Z170 Estrogen receptor positive status [ER+]: Secondary | ICD-10-CM | POA: Diagnosis not present

## 2018-08-15 MED ORDER — GADOBENATE DIMEGLUMINE 529 MG/ML IV SOLN
15.0000 mL | Freq: Once | INTRAVENOUS | Status: AC | PRN
  Administered 2018-08-15: 11 mL via INTRAVENOUS

## 2018-08-16 ENCOUNTER — Telehealth: Payer: Self-pay | Admitting: Adult Health

## 2018-08-16 ENCOUNTER — Telehealth: Payer: Self-pay | Admitting: *Deleted

## 2018-08-16 ENCOUNTER — Other Ambulatory Visit: Payer: Self-pay | Admitting: Adult Health

## 2018-08-16 ENCOUNTER — Other Ambulatory Visit: Payer: Self-pay

## 2018-08-16 DIAGNOSIS — Z170 Estrogen receptor positive status [ER+]: Principal | ICD-10-CM

## 2018-08-16 DIAGNOSIS — D0512 Intraductal carcinoma in situ of left breast: Secondary | ICD-10-CM

## 2018-08-16 DIAGNOSIS — C50412 Malignant neoplasm of upper-outer quadrant of left female breast: Secondary | ICD-10-CM

## 2018-08-16 DIAGNOSIS — N63 Unspecified lump in unspecified breast: Secondary | ICD-10-CM

## 2018-08-16 NOTE — Telephone Encounter (Signed)
Scheduled appt for bil mammo and bil [place] per Dr. Donne Hazel for 9/6 at 9:20am. Called pt with appt date and time and confirmed with pt.

## 2018-08-16 NOTE — Telephone Encounter (Signed)
Called patient to review MRI results.  LMOM to call back.    Wilber Bihari, NP

## 2018-08-18 ENCOUNTER — Ambulatory Visit
Admission: RE | Admit: 2018-08-18 | Discharge: 2018-08-18 | Disposition: A | Discharge status: Home / Self Care | Payer: 59 | Source: Ambulatory Visit | Attending: Adult Health | Admitting: Adult Health

## 2018-08-18 ENCOUNTER — Other Ambulatory Visit: Payer: Self-pay | Admitting: Adult Health

## 2018-08-18 DIAGNOSIS — Z170 Estrogen receptor positive status [ER+]: Principal | ICD-10-CM

## 2018-08-18 DIAGNOSIS — D0512 Intraductal carcinoma in situ of left breast: Secondary | ICD-10-CM

## 2018-08-18 DIAGNOSIS — N6310 Unspecified lump in the right breast, unspecified quadrant: Secondary | ICD-10-CM

## 2018-08-18 DIAGNOSIS — Z853 Personal history of malignant neoplasm of breast: Secondary | ICD-10-CM | POA: Diagnosis not present

## 2018-08-18 DIAGNOSIS — N6489 Other specified disorders of breast: Secondary | ICD-10-CM | POA: Diagnosis not present

## 2018-08-18 DIAGNOSIS — R928 Other abnormal and inconclusive findings on diagnostic imaging of breast: Secondary | ICD-10-CM | POA: Diagnosis not present

## 2018-08-18 DIAGNOSIS — C50412 Malignant neoplasm of upper-outer quadrant of left female breast: Secondary | ICD-10-CM

## 2018-08-18 DIAGNOSIS — N63 Unspecified lump in unspecified breast: Secondary | ICD-10-CM

## 2018-08-18 HISTORY — DX: Personal history of antineoplastic chemotherapy: Z92.21

## 2018-09-08 IMAGING — MG 2D DIGITAL DIAGNOSTIC UNILATERAL LEFT MAMMOGRAM WITH CAD AND ADJ
8 of 12 series · 8 of 24 positions shown · non-contrast
Comparison: Previous exam(s).

CLINICAL DATA: Palpable lump within the periareolar left breast.
History of left breast cancer in 2844, outer left breast, status
post breast conservation surgery.

EXAM:
2D DIGITAL DIAGNOSTIC LEFT MAMMOGRAM WITH CAD AND ADJUNCT TOMO
ULTRASOUND LEFT BREAST

[L ML (1 of 2)]
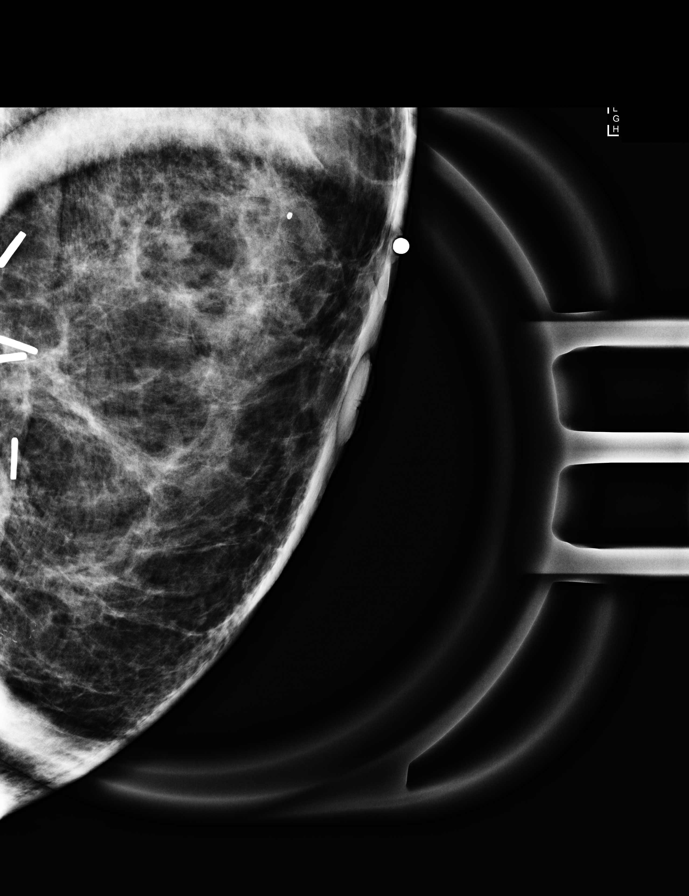

[L CC (1 of 2)]
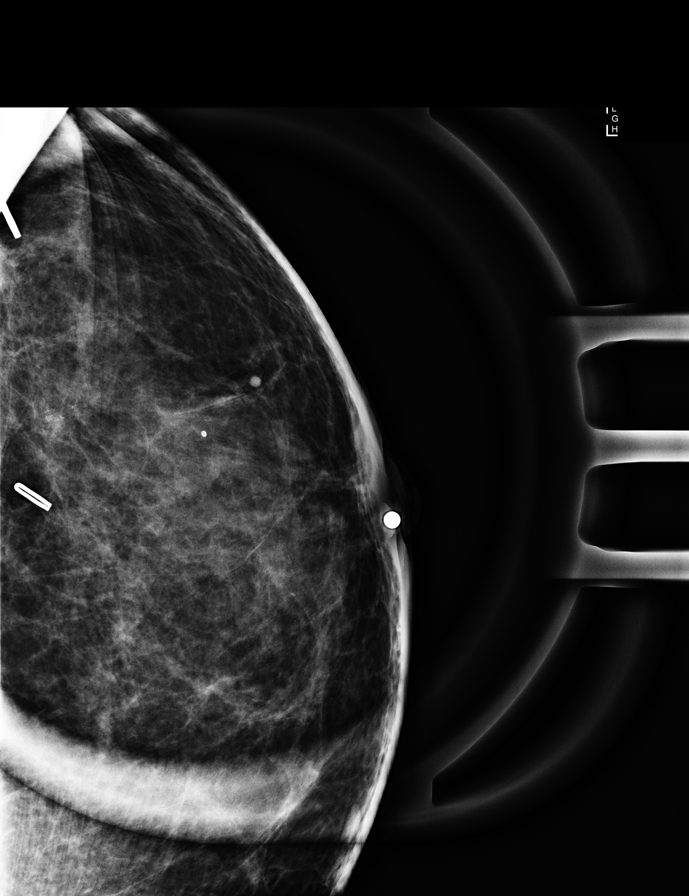

[L ML (2 of 2)]
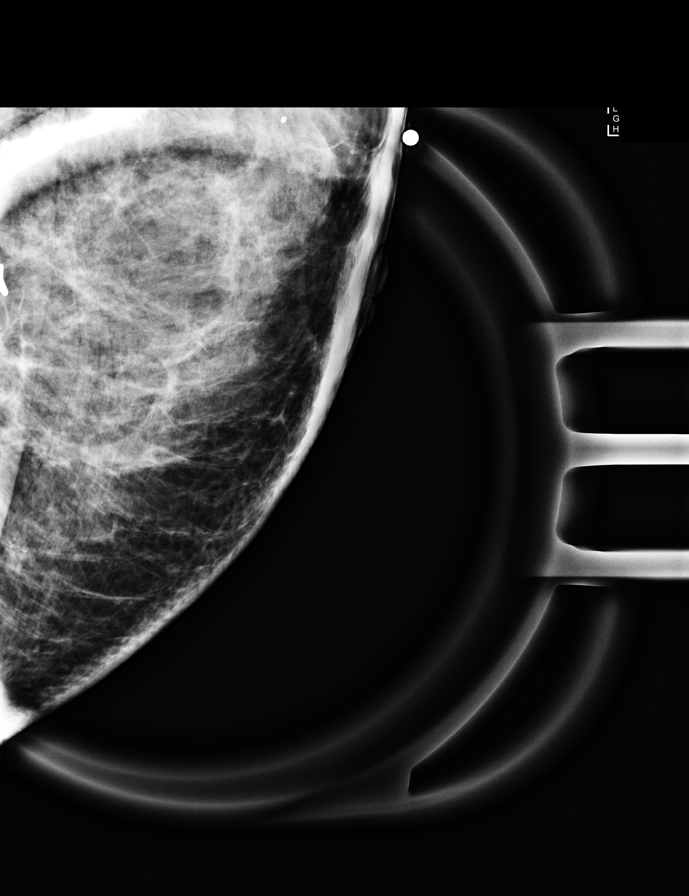

[L CC (2 of 2)]
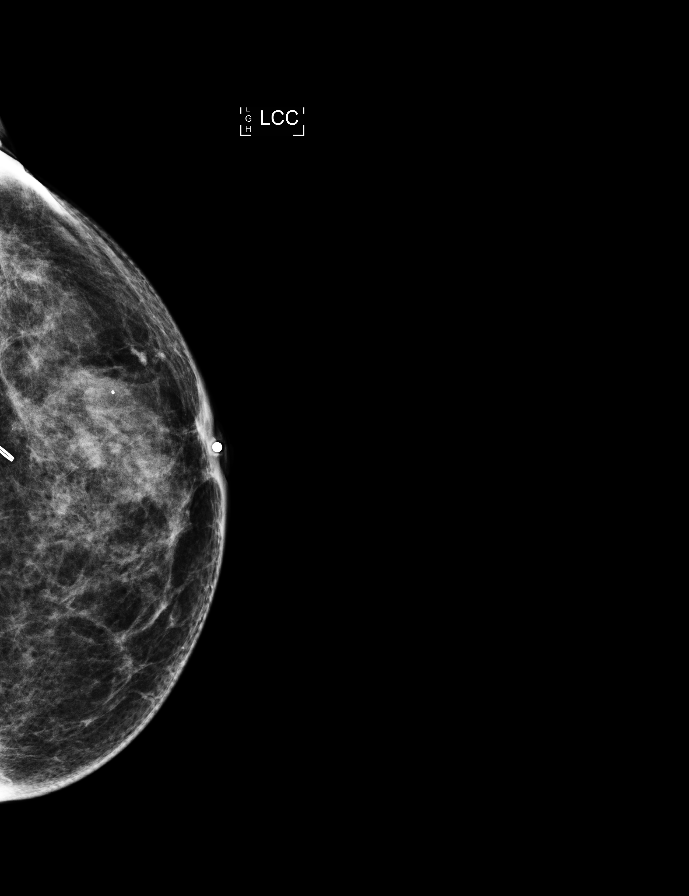

[L MLO synth-2D]
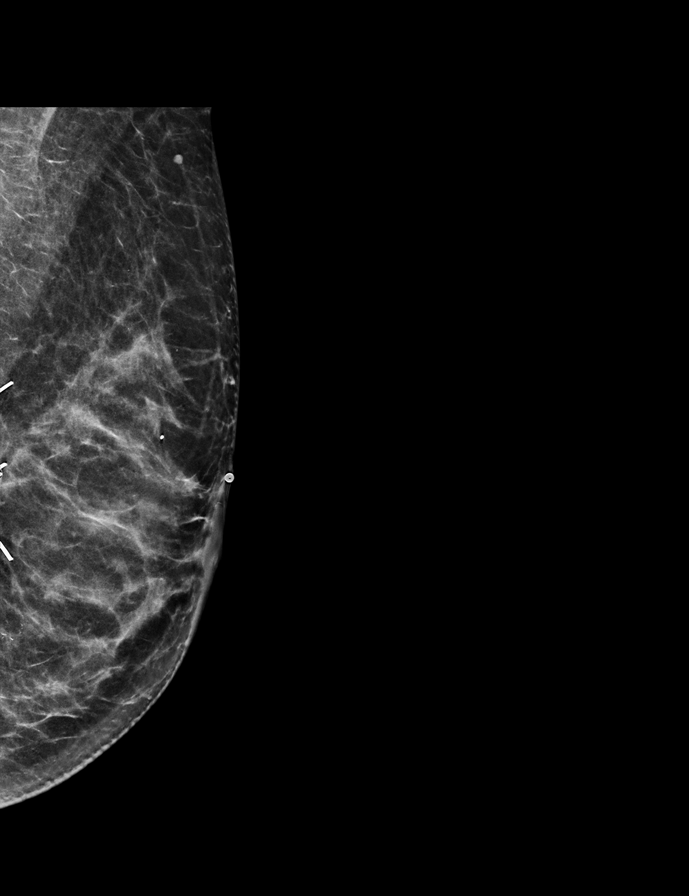

[L CC synth-2D]
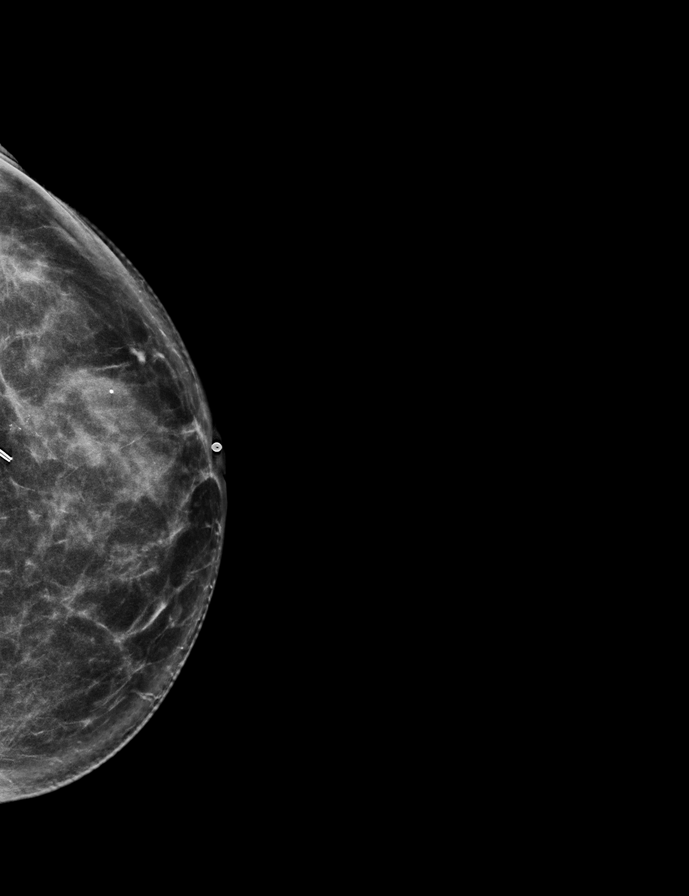

[L TAN synth-2D]
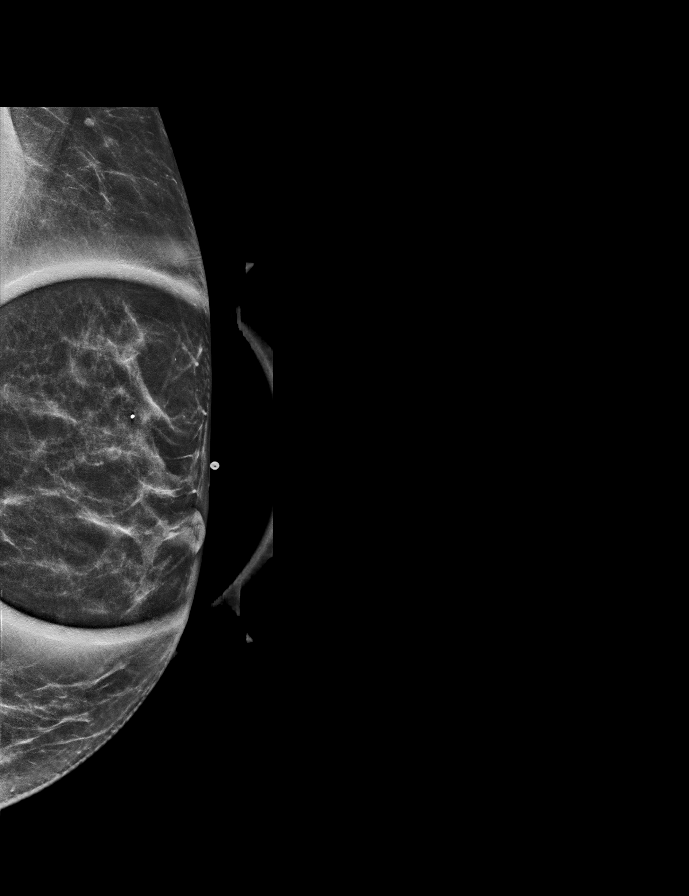

[L MLO]
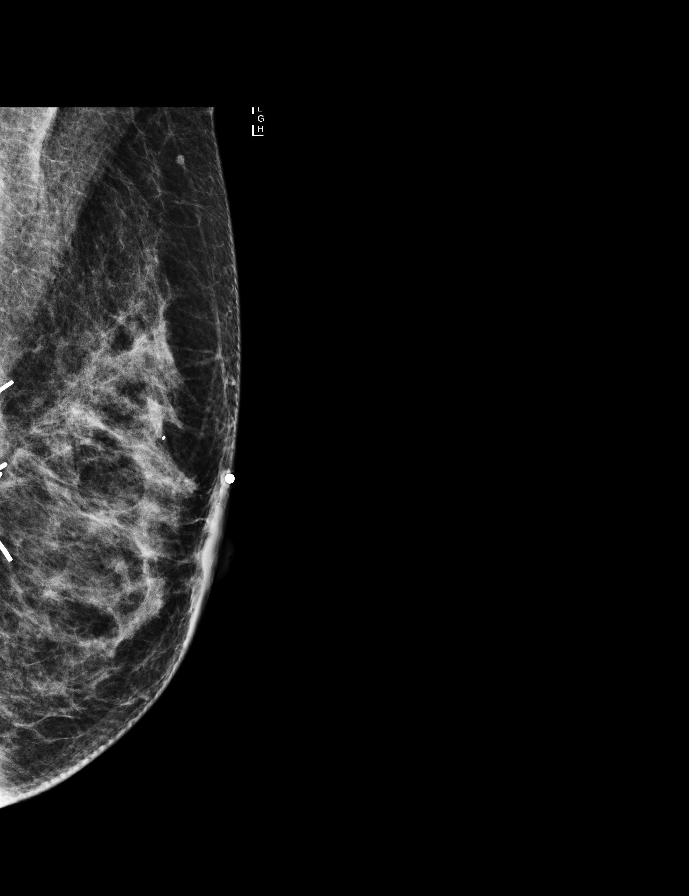

[8 of 24 positions shown; findings below may reference images not displayed]

ACR Breast Density Category c: The breast tissue is heterogeneously
dense, which may obscure small masses.
FINDINGS: Left breast 2D CC and MLO projections were obtained today, with
additional 3D tomosynthesis, and with additional spot compression
view of the periareolar left breast corresponding to the area of
clinical concern with overlying skin marker in place.

In the area of clinical concern, there are no new dominant masses,
suspicious calcifications or secondary signs of malignancy.

However, there are new pleomorphic calcifications within the lower
outer quadrant of the left breast, near the previous lumpectomy
site, measuring 7 mm extent.

Mammographic images were processed with CAD.

Targeted ultrasound is performed, evaluating the periareolar left
breast, 12 o'clock axis region, as directed by the patient, showing
only normal fibroglandular tissues and fat lobules. There is a ridge
of normal dense fibroglandular tissue at the 12 o'clock axis, 1 cm
from the nipple, corresponding to the area of clinical concern. No
suspicious solid or cystic mass is identified.
IMPRESSION: 1. New pleomorphic calcifications within the lower outer quadrant of
the left breast, in the vicinity of the previous lumpectomy site,
measuring 7 mm extent. This is a suspicious finding for which
stereotactic biopsy is recommended, although the calcifications may
represent benign postsurgical fat necrosis.
2. Ridge of normal dense fibroglandular tissue within the left
breast at the 12 o'clock axis, 1 cm from the nipple, corresponding
to the area of today's clinical concern.

RECOMMENDATION:
Stereotactic biopsy for the left breast calcifications.

Stereotactic biopsy is scheduled for [DATE]st.

I have discussed the findings and recommendations with the patient.
Results were also provided in writing at the conclusion of the
visit. If applicable, a reminder letter will be sent to the patient
regarding the next appointment.

BI-RADS CATEGORY  4: Suspicious.

## 2018-09-14 DIAGNOSIS — Z923 Personal history of irradiation: Secondary | ICD-10-CM | POA: Diagnosis not present

## 2018-09-14 DIAGNOSIS — Z853 Personal history of malignant neoplasm of breast: Secondary | ICD-10-CM | POA: Diagnosis not present

## 2018-09-14 DIAGNOSIS — Z9013 Acquired absence of bilateral breasts and nipples: Secondary | ICD-10-CM | POA: Diagnosis not present

## 2018-09-27 DIAGNOSIS — Z23 Encounter for immunization: Secondary | ICD-10-CM | POA: Diagnosis not present

## 2018-10-19 IMAGING — MG BREAST SURGICAL SPECIMEN
1 series · 1 of 1 positions shown · non-contrast
Comparison: Previous exam(s).

CLINICAL DATA: Evaluate surgical specimen following left breast
excision of calcifications.

EXAM:
SPECIMEN RADIOGRAPH OF THE LEFT BREAST

[L]
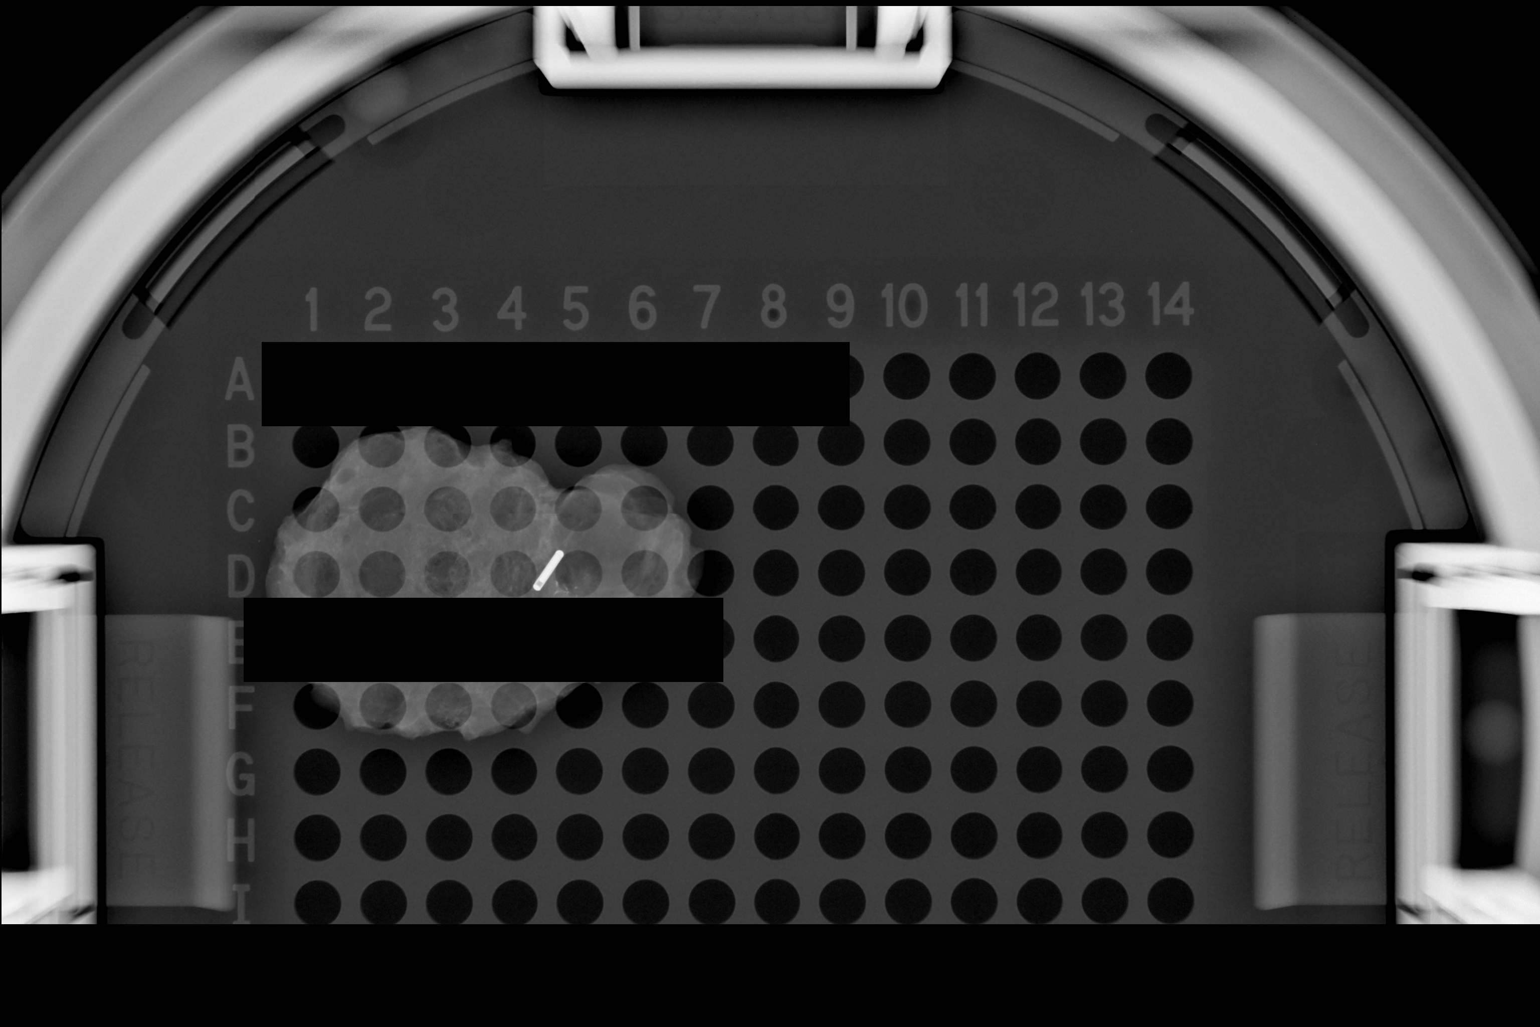

[1 of 1 positions shown; findings below may reference images not displayed]

FINDINGS: Status post excision of the left breast. The radioactive seed and
calcifications of interest are present, completely intact, and were
marked for pathology.
IMPRESSION: Specimen radiograph of the left breast.

## 2018-11-14 DIAGNOSIS — Z681 Body mass index (BMI) 19 or less, adult: Secondary | ICD-10-CM | POA: Diagnosis not present

## 2018-11-14 DIAGNOSIS — Z124 Encounter for screening for malignant neoplasm of cervix: Secondary | ICD-10-CM | POA: Diagnosis not present

## 2018-11-14 DIAGNOSIS — Z1389 Encounter for screening for other disorder: Secondary | ICD-10-CM | POA: Diagnosis not present

## 2018-11-14 DIAGNOSIS — Z01419 Encounter for gynecological examination (general) (routine) without abnormal findings: Secondary | ICD-10-CM | POA: Diagnosis not present

## 2018-11-14 DIAGNOSIS — Z130 Encounter for screening for diseases of the blood and blood-forming organs and certain disorders involving the immune mechanism: Secondary | ICD-10-CM | POA: Diagnosis not present

## 2018-11-14 LAB — HM PAP SMEAR

## 2018-11-15 DIAGNOSIS — Z124 Encounter for screening for malignant neoplasm of cervix: Secondary | ICD-10-CM | POA: Diagnosis not present

## 2018-11-20 ENCOUNTER — Telehealth: Payer: Self-pay

## 2018-11-20 ENCOUNTER — Inpatient Hospital Stay: Payer: 59 | Attending: Oncology

## 2018-11-20 DIAGNOSIS — C50412 Malignant neoplasm of upper-outer quadrant of left female breast: Secondary | ICD-10-CM | POA: Insufficient documentation

## 2018-11-20 DIAGNOSIS — Z170 Estrogen receptor positive status [ER+]: Secondary | ICD-10-CM | POA: Diagnosis not present

## 2018-11-20 LAB — COMPREHENSIVE METABOLIC PANEL
ALBUMIN: 4 g/dL (ref 3.5–5.0)
ALK PHOS: 36 U/L — AB (ref 38–126)
ALT: 14 U/L (ref 0–44)
ANION GAP: 9 (ref 5–15)
AST: 23 U/L (ref 15–41)
BUN: 12 mg/dL (ref 6–20)
CALCIUM: 9 mg/dL (ref 8.9–10.3)
CO2: 27 mmol/L (ref 22–32)
Chloride: 106 mmol/L (ref 98–111)
Creatinine, Ser: 0.79 mg/dL (ref 0.44–1.00)
GFR calc non Af Amer: >60 mL/min (ref >60)
GLUCOSE: 90 mg/dL (ref 70–99)
POTASSIUM: 4.1 mmol/L (ref 3.5–5.1)
SODIUM: 142 mmol/L (ref 135–145)
TOTAL PROTEIN: 6.4 g/dL — AB (ref 6.5–8.1)
Total Bilirubin: 0.4 mg/dL (ref 0.3–1.2)

## 2018-11-20 LAB — CBC WITH DIFFERENTIAL (CANCER CENTER ONLY)
ABS IMMATURE GRANULOCYTES: 0.01 10*3/uL (ref 0.00–0.07)
BASOS ABS: 0 10*3/uL (ref 0.0–0.1)
Basophils Relative: 1 %
EOS ABS: 0 10*3/uL (ref 0.0–0.5)
Eosinophils Relative: 1 %
HCT: 32.8 % — ABNORMAL LOW (ref 36.0–46.0)
Hemoglobin: 10.7 g/dL — ABNORMAL LOW (ref 12.0–15.0)
IMMATURE GRANULOCYTES: 0 %
LYMPHS ABS: 1.1 10*3/uL (ref 0.7–4.0)
Lymphocytes Relative: 38 %
MCH: 29.6 pg (ref 26.0–34.0)
MCHC: 32.6 g/dL (ref 30.0–36.0)
MCV: 90.6 fL (ref 80.0–100.0)
Monocytes Absolute: 0.2 10*3/uL (ref 0.1–1.0)
Monocytes Relative: 6 %
NEUTROS ABS: 1.6 10*3/uL — AB (ref 1.7–7.7)
NEUTROS PCT: 54 %
NRBC: 0 % (ref 0.0–0.2)
PLATELETS: 220 10*3/uL (ref 150–400)
RBC: 3.62 MIL/uL — ABNORMAL LOW (ref 3.87–5.11)
RDW: 12.8 % (ref 11.5–15.5)
WBC Count: 2.9 10*3/uL — ABNORMAL LOW (ref 4.0–10.5)

## 2018-11-20 NOTE — Telephone Encounter (Signed)
Spoke with patient to inform of lab results.  She states she has not had any heavy periods or blood in stool.  Stools are normal.  Nurse asked if she was having any other symptoms at this time and she stated "no".  Knows to call center with issues/concerns.

## 2018-11-20 NOTE — Telephone Encounter (Signed)
-----   Message from Gardenia Phlegm, NP sent at 11/20/2018 12:28 PM EST ----- Patient WBC is stable, but she is also now anemic.  Has she had heavy periods, blood in her stool/black tarry stool? ----- Message ----- From: Interface, Lab In [place] Sent: 11/20/2018  11:13 AM EST To: Gardenia Phlegm, NP

## 2018-11-21 ENCOUNTER — Encounter: Payer: Self-pay | Admitting: General Practice

## 2018-11-22 ENCOUNTER — Telehealth: Payer: Self-pay | Admitting: Family Medicine

## 2018-11-22 NOTE — Telephone Encounter (Signed)
Please advise 

## 2018-11-22 NOTE — Telephone Encounter (Signed)
Thankfully the anemia is very mild- and not far off from what we see in menstruating females.  I would add a daily iron supplement (and as these can cause constipation, add a stool softener as well)

## 2018-11-22 NOTE — Telephone Encounter (Signed)
Patient notified of PCP recommendations and is agreement and expresses an understanding.   Ok for PEC to Discuss results / PCP recommendations / Schedule patient.   

## 2018-11-22 NOTE — Telephone Encounter (Signed)
Copied from [place] [phone]. Topic: Quick Communication - See Telephone Encounter >> Nov 22, 2018  1:18 PM Vernona Rieger wrote: CRM for notification. See Telephone encounter for: 11/22/18.  Patient states she had blood work done at the cancer center on Monday, December 9th. They advised her that she is anemic. She would like Dr Birdie Riddle to know this and see if there is anything additional she needs to do or take?

## 2018-12-12 ENCOUNTER — Telehealth: Payer: Self-pay | Admitting: *Deleted

## 2018-12-12 DIAGNOSIS — C50412 Malignant neoplasm of upper-outer quadrant of left female breast: Secondary | ICD-10-CM

## 2018-12-12 DIAGNOSIS — Z170 Estrogen receptor positive status [ER+]: Principal | ICD-10-CM

## 2018-12-12 NOTE — Telephone Encounter (Signed)
Dr Jana Hakim wants her to have labs done next week.

## 2018-12-12 NOTE — Telephone Encounter (Signed)
Pt states she is having fatigue and headaches. Is wondering if she can have her labs checked and see Dr Jana Hakim.

## 2018-12-15 ENCOUNTER — Telehealth: Payer: Self-pay | Admitting: Oncology

## 2018-12-15 ENCOUNTER — Other Ambulatory Visit: Payer: Self-pay | Admitting: Oncology

## 2018-12-15 NOTE — Telephone Encounter (Signed)
Spoke with patient re lab 1/7

## 2018-12-18 ENCOUNTER — Telehealth: Payer: Self-pay | Admitting: Oncology

## 2018-12-18 NOTE — Telephone Encounter (Signed)
Spoke with patient re 1/8 f/u/ also confirmed 1/7 lab.

## 2018-12-19 ENCOUNTER — Inpatient Hospital Stay: Payer: 59 | Attending: Oncology

## 2018-12-19 DIAGNOSIS — C50412 Malignant neoplasm of upper-outer quadrant of left female breast: Secondary | ICD-10-CM | POA: Diagnosis not present

## 2018-12-19 DIAGNOSIS — Z923 Personal history of irradiation: Secondary | ICD-10-CM | POA: Insufficient documentation

## 2018-12-19 DIAGNOSIS — Z170 Estrogen receptor positive status [ER+]: Secondary | ICD-10-CM | POA: Diagnosis not present

## 2018-12-19 DIAGNOSIS — Z79899 Other long term (current) drug therapy: Secondary | ICD-10-CM | POA: Insufficient documentation

## 2018-12-19 DIAGNOSIS — Z9013 Acquired absence of bilateral breasts and nipples: Secondary | ICD-10-CM | POA: Diagnosis not present

## 2018-12-19 DIAGNOSIS — Z79810 Long term (current) use of selective estrogen receptor modulators (SERMs): Secondary | ICD-10-CM | POA: Insufficient documentation

## 2018-12-19 DIAGNOSIS — D509 Iron deficiency anemia, unspecified: Secondary | ICD-10-CM | POA: Insufficient documentation

## 2018-12-19 DIAGNOSIS — Z9221 Personal history of antineoplastic chemotherapy: Secondary | ICD-10-CM | POA: Insufficient documentation

## 2018-12-19 LAB — CBC WITH DIFFERENTIAL (CANCER CENTER ONLY)
ABS IMMATURE GRANULOCYTES: 0 10*3/uL (ref 0.00–0.07)
BASOS PCT: 1 %
Basophils Absolute: 0 10*3/uL (ref 0.0–0.1)
Eosinophils Absolute: 0 10*3/uL (ref 0.0–0.5)
Eosinophils Relative: 1 %
HCT: 37.4 % (ref 36.0–46.0)
Hemoglobin: 11.8 g/dL — ABNORMAL LOW (ref 12.0–15.0)
IMMATURE GRANULOCYTES: 0 %
LYMPHS ABS: 1.2 10*3/uL (ref 0.7–4.0)
LYMPHS PCT: 45 %
MCH: 28.6 pg (ref 26.0–34.0)
MCHC: 31.6 g/dL (ref 30.0–36.0)
MCV: 90.8 fL (ref 80.0–100.0)
MONOS PCT: 6 %
Monocytes Absolute: 0.2 10*3/uL (ref 0.1–1.0)
NEUTROS ABS: 1.3 10*3/uL — AB (ref 1.7–7.7)
NEUTROS PCT: 47 %
PLATELETS: 213 10*3/uL (ref 150–400)
RBC: 4.12 MIL/uL (ref 3.87–5.11)
RDW: 12.9 % (ref 11.5–15.5)
WBC Count: 2.7 10*3/uL — ABNORMAL LOW (ref 4.0–10.5)
nRBC: 0 % (ref 0.0–0.2)

## 2018-12-19 LAB — RETIC PANEL
Immature Retic Fract: 5.8 % (ref 2.3–15.9)
RBC.: 4.12 MIL/uL (ref 3.87–5.11)
RETIC CT PCT: 1.2 % (ref 0.4–3.1)
Retic Count, Absolute: 48.6 10*3/uL (ref 19.0–186.0)
Reticulocyte Hemoglobin: 34.3 pg (ref >27.9)

## 2018-12-19 LAB — FERRITIN: FERRITIN: 13 ng/mL (ref 11–307)

## 2018-12-19 NOTE — Progress Notes (Signed)
[place]  [phone] [phone]     ID: Nancy Austin DOB: 1976-01-09  MR#: [id number]  BWL#:[id number]  Patient Care Team: Midge Minium, MD as PCP - General (Family Medicine) Rolm Bookbinder, MD as Consulting Physician (General Surgery) Gery Pray, MD as Consulting Physician (Radiation Oncology) Haru Anspaugh, Virgie Dad, MD as Consulting Physician (Oncology) Paula Compton, MD as Consulting Physician (Obstetrics and Gynecology) Hennie Duos, MD as Consulting Physician (Rheumatology) OTHER MD:  CHIEF COMPLAINT: Recurrent estrogen receptor positive breast cancer  CURRENT TREATMENT:  tamoxifen   BREAST CANCER HISTORY: From the original Intake note:  Nancy Austin herself found a lump in her left breast early October 2015 and brought it to her gynecologist attention. On 09/20/2014 she was set up for bilateral diagnostic mammography and left breast ultrasonography of the breast Center. This was the patient's first ever mammogram. In the area of concern in the left breast there was suspicious pleomorphic calcifications spanning 1.1 cm. There was no discrete mammographic mass in this dense breasts (category C.). On physical exam, there was a palpable firm at 1.5 cm mass at the 3:00 position in the left breast. By ultrasound this was irregular and hypoechoic and measured 1.8 cm. Ultrasound of the left axilla was unremarkable. Aside from multiple cysts in the left breast there were no other findings of concern.  On the same day, 09/20/2014, the patient underwent biopsy of the left breast palpable mass. This showed ([id number]) an invasive ductal carcinoma, grade 2, estrogen receptor 100% positive, progesterone receptor 89% positive, both with strong staining intensity, with an MIB-1 of 16%. HER-2 was amplified with a signals ratio of 5.07 and a copy number per cell of 6.85.   On 09/30/2014 the patient underwent bilateral breast MRIs. This showed a  1.7 cm mass in the upper outer quadrant of the left breast. There was no other suspicious finding in either breast and no abnormal appearing adenopathy.  The patient's subsequent history is as detailed below.   INTERVAL HISTORY: Nancy Austin returns today for follow up of her left sided breast cancers, the first diagnosed in 2015 as triple positive, and the second diagnosed in 2018 which was ER negative DCIS.  She has undergone mastectomies and reconstruction with implant placement.  She continues on Tamoxifen daily which she is tolerating well.   At her last visit to this office she was noted to have a right upper outer quadrant nodule on exam. On bilateral breast MRI on 08/15/2018, soft tissue thickening and persistent enhancement in the upper outer left breast and no abnormalities on the right led to mammogram with bilateral breast ultrasounds. Performed on 08/18/2018, these scans showed no mammographic or sonographic evidence of malignancy on the left (persistent enhancement likely post-surgical in nature), and most likely benign, probably scarring or fat necrosis, tissue adjacent to the implant on the right, correlating with the palpable lump. Reassessment recommendation will be based on if the patient feels as if the mass is growing.  REVIEW OF SYSTEMS: Nancy Austin has noticed some changes of concern to her.  She feels more tired.  She tells me she has lost 20 pounds in the last several months although she has not changed her diet or exercise program.  She was having mild headaches for about 2 weeks, most days.  She then started taking multivitamins which do include iron and her headaches are little better.  Her hemoglobin also was lower and that was a reason for the visit today.  She says her  bowel movements are different from her baseline, less, 1, and a bit harder.  She has seen some blood in her stool but no melena.  Aside from these issues a detailed review of systems today was stable    PAST MEDICAL  HISTORY: Past Medical History:  Diagnosis Date  . Anemia   . Benign skin lesion of forehead 10/2015  . Breast cancer ([place])   . Cancer ([place])   . History of breast cancer 09/2014   left  . History of chemotherapy    finished 02/10/2015  . History of radiation therapy 04/16/2015 - 06/02/2015  . Personal history of chemotherapy   . Personal history of radiation therapy     PAST SURGICAL HISTORY: Past Surgical History:  Procedure Laterality Date  . AUGMENTATION MAMMAPLASTY Bilateral   . BREAST LUMPECTOMY    . BREAST RECONSTRUCTION WITH PLACEMENT OF TISSUE EXPANDER AND FLEX HD (ACELLULAR HYDRATED DERMIS) Right 09/26/2017   Procedure: RIGHT BREAST RECONSTRUCTION WITH PLACEMENT OF TISSUE EXPANDER AND FLEX HD (ACELLULAR HYDRATED DERMIS);  Surgeon: Irene Limbo, MD;  Location: [facility];  Service: Plastics;  Laterality: Right;  . LAPAROSCOPIC OOPHERECTOMY Bilateral   . LAPAROSCOPIC SALPINGO OOPHERECTOMY Bilateral 11/12/2015   Procedure: LAPAROSCOPIC BILATERAL SALPINGO OOPHORECTOMY;  Surgeon: Paula Compton, MD;  Location: [facility];  Service: Gynecology;  Laterality: Bilateral;  . LATISSIMUS FLAP TO BREAST Left 09/26/2017   Procedure: LATISSIMUS FLAP TO LEFT BREAST;  Surgeon: Irene Limbo, MD;  Location: [facility];  Service: Plastics;  Laterality: Left;  . LESION REMOVAL Left 10/22/2015   Procedure: EXCISION OF FOREHEAD LESION 1X1CM;  Surgeon: Rolm Bookbinder, MD;  Location: [facility];  Service: General;  Laterality: Left;  Marland Kitchen MASTECTOMY Bilateral   . NIPPLE SPARING MASTECTOMY Bilateral 09/26/2017   Procedure: LEFT NIPPLE SPARING MASTECTOMY, RIGHT PROPHYLACTIC NIPPLE SPARING MASTECTOMY;  Surgeon: Rolm Bookbinder, MD;  Location: [facility];  Service: General;  Laterality: Bilateral;  . PORT-A-CATH REMOVAL N/A 10/22/2015   Procedure: REMOVAL PORT-A-CATH;  Surgeon: Rolm Bookbinder, MD;  Location: [facility];  Service: General;  Laterality: N/A;  . PORTACATH PLACEMENT  N/A 10/15/2014   Procedure: INSERTION PORT-A-CATH;  Surgeon: Rolm Bookbinder, MD;  Location: [facility];  Service: General;  Laterality: N/A;  . RADIOACTIVE SEED GUIDED EXCISIONAL BREAST BIOPSY Left 08/17/2017   Procedure: LEFT RADIOACTIVE SEED GUIDED EXCISIONAL BREAST BIOPSY ERAS PATHWAY;  Surgeon: Rolm Bookbinder, MD;  Location: [facility];  Service: General;  Laterality: Left;  . RADIOACTIVE SEED GUIDED PARTIAL MASTECTOMY WITH AXILLARY SENTINEL LYMPH NODE BIOPSY Left 03/06/2015   Procedure: RADIOACTIVE SEED GUIDED PARTIAL MASTECTOMY WITH AXILLARY SENTINEL LYMPH NODE BIOPSY;  Surgeon: Rolm Bookbinder, MD;  Location: [facility];  Service: General;  Laterality: Left;  . REMOVAL OF BILATERAL TISSUE EXPANDERS WITH PLACEMENT OF BILATERAL BREAST IMPLANTS Bilateral 01/06/2018   Procedure: REMOVAL OF BILATERAL TISSUE EXPANDERS WITH PLACEMENT OF BILATERAL BREAST IMPLANTS;  Surgeon: Irene Limbo, MD;  Location: [facility];  Service: Plastics;  Laterality: Bilateral;  . TONSILLECTOMY  1996    FAMILY HISTORY Family History  Problem Relation Age of Onset  . Skin cancer Father   . Hyperlipidemia Father   . Prostate cancer Father   . Prostate cancer Maternal Uncle 38       currently 67  . Multiple myeloma Paternal Grandmother 32       Deceased 30  . Cancer Paternal Grandmother        bone  . Prostate cancer Paternal Uncle   .  Lung disease Maternal Grandmother   . AAA (abdominal aortic aneurysm) Maternal Grandmother   . Stroke Maternal Grandfather   The patient's parents are both living. The patient has one brother, no sisters. One grandmother was diagnosed with multiple myeloma at age 62. There is no history of breast or ovarian cancer in the family.   GYNECOLOGIC HISTORY:  Patient's last menstrual period was 02/22/2015.  Menarche age 59, first live birth age 55. The patient is GX P2. She was still having regular periods at the start of chemotherapy.  She was on birth control pills on and off for the last 15 years, stopping in October of 2015.   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 Her husband Rolla Plate works as a Immunologist at [employer]. Their children are aged 77, and 18 as of August 2018     ADVANCED DIRECTIVES: In place   HEALTH MAINTENANCE: Social History   Tobacco Use  . Smoking status: Never Smoker  . Smokeless tobacco: Never Used  Substance Use Topics  . Alcohol use: Yes    Comment: 3 x/week  . Drug use: No     Colonoscopy:  PAP:  November 2014  Bone density:  Lipid panel:  Allergies  Allergen Reactions  . Chlorhexidine Itching and Rash  . Morphine And Related Nausea And Vomiting  . Other Rash    Dermabond  . Tegaderm Ag Mesh [Silver] Rash    Reports that the larger tegaderm causes rashes    Current Outpatient Medications  Medication Sig Dispense Refill  . gabapentin (NEURONTIN) 300 MG capsule TAKE 1 CAPSULE BY MOUTH DAILY AT BEDTIME 90 capsule 4  . meloxicam (MOBIC) 15 MG tablet TAKE 1 TABLET BY MOUTH EVERY DAY (Patient taking differently: Take 15 mg by mouth at bedtime) 30 tablet 1  . Multiple Vitamin (MULTI-VITAMINS) TABS Take by mouth.    . tamoxifen (NOLVADEX) 20 MG tablet Take 1 tablet (20 mg total) by mouth daily. 90 tablet 4   No current facility-administered medications for this visit.     OBJECTIVE: Young appearing white woman in no acute distress Vitals:   12/20/18 1126  BP: 109/77  Pulse: (!) 51  Resp: 18  Temp: 98.5 F (36.9 C)  SpO2: 100%     Body mass index is 20.04 kg/m.    ECOG FS:1 - Symptomatic but completely ambulatory   Filed Weights   12/20/18 1126  Weight: 120 lb 6.4 oz (54.6 kg)   Sclerae unicteric, EOMs intact No cervical or supraclavicular adenopathy Lungs no rales or rhonchi Heart regular rate and rhythm Abd soft, nontender, positive bowel sounds MSK no focal spinal tenderness, no upper extremity lymphedema Neuro: nonfocal, well  oriented, appropriate affect Breasts: Status post bilateral mastectomies.  No evidence of local recurrence.  Both axillae are benign.    LAB RESULTS:  CMP     Component Value Date/Time   NA 142 11/20/2018 1103   NA 141 08/02/2017 1536   K 4.1 11/20/2018 1103   K 4.0 08/02/2017 1536   CL 106 11/20/2018 1103   CO2 27 11/20/2018 1103   CO2 32 (H) 08/02/2017 1536   GLUCOSE 90 11/20/2018 1103   GLUCOSE 93 08/02/2017 1536   BUN 12 11/20/2018 1103   BUN 11.4 08/02/2017 1536   CREATININE 0.79 11/20/2018 1103   CREATININE 0.8 08/02/2017 1536   CALCIUM 9.0 11/20/2018 1103   CALCIUM 9.8 08/02/2017 1536   PROT 6.4 (L) 11/20/2018 1103  PROT 6.7 08/02/2017 1536   ALBUMIN 4.0 11/20/2018 1103   ALBUMIN 3.9 08/02/2017 1536   AST 23 11/20/2018 1103   AST 22 08/02/2017 1536   ALT 14 11/20/2018 1103   ALT 12 08/02/2017 1536   ALKPHOS 36 (L) 11/20/2018 1103   ALKPHOS 51 08/02/2017 1536   BILITOT 0.4 11/20/2018 1103   BILITOT 0.31 08/02/2017 1536   GFRNONAA >60 11/20/2018 1103   GFRAA >60 11/20/2018 1103    I No results found for: SPEP  Lab Results  Component Value Date   WBC 2.7 (L) 12/19/2018   NEUTROABS 1.3 (L) 12/19/2018   HGB 11.8 (L) 12/19/2018   HCT 37.4 12/19/2018   MCV 90.8 12/19/2018   PLT 213 12/19/2018      Chemistry      Component Value Date/Time   NA 142 11/20/2018 1103   NA 141 08/02/2017 1536   K 4.1 11/20/2018 1103   K 4.0 08/02/2017 1536   CL 106 11/20/2018 1103   CO2 27 11/20/2018 1103   CO2 32 (H) 08/02/2017 1536   BUN 12 11/20/2018 1103   BUN 11.4 08/02/2017 1536   CREATININE 0.79 11/20/2018 1103   CREATININE 0.8 08/02/2017 1536      Component Value Date/Time   CALCIUM 9.0 11/20/2018 1103   CALCIUM 9.8 08/02/2017 1536   ALKPHOS 36 (L) 11/20/2018 1103   ALKPHOS 51 08/02/2017 1536   AST 23 11/20/2018 1103   AST 22 08/02/2017 1536   ALT 14 11/20/2018 1103   ALT 12 08/02/2017 1536   BILITOT 0.4 11/20/2018 1103   BILITOT 0.31 08/02/2017 1536         No results found for: LABCA2  No components found for: LABCA125  No results for input(s): INR in the last 168 hours.  Urinalysis No results found for: COLORURINE  STUDIES: Results for MILLER, EDGINGTON (MRN [id number]) as of 12/20/2018 17:13  Ref. Range 09/27/2017 07:06 05/01/2018 11:25 08/09/2018 09:39 11/20/2018 11:03 12/19/2018 11:12  Hemoglobin Latest Ref Range: 12.0 - 15.0 g/dL 10.1 (L) 12.5 12.3 10.7 (L) 11.8 (L)   Results for VOLA, BENEKE (MRN [id number]) as of 12/20/2018 17:13  Ref. Range 12/19/2018 11:13  Ferritin Latest Ref Range: 11 - 307 ng/mL 13    ASSESSMENT: 43 y.o. BRCA negative [place] woman s/p Left breast upper outer quadrant biopsy 09/20/2014, for a clinical T1c N0, stage IA invasive ductal carcinoma, grade 2, estrogen and progesterone receptor positive, HER-2 amplified with a signal is ratio of 5.07, and an MIB-1 of 16%  (1) neoadjuvant treatment consisting of carboplatin, docetaxel, trastuzumab and pertuzumab given every 21 days x6,  completed 02/10/2015  (a) breast MRI 02/11/2015 showed a complete radiologic response  (2) left lumpectomy and sentinel lymph node sampling 03/06/2015 showed a residual ypT1c ypN1a, stage IIA invasive ductal carcinoma, grade 1, with repeat prognostic panel still triple positive, and negative margins  (3) trastuzumab continued to total one year (last dose 10/01/2015)  (a) final echo 08/07/2015 finds an ejection fraction of 55-60%.  (4) adjuvant radiation 04/16/2015-06/02/2015 Left breast 50.4 gray in 28 fractions, axillary/supraclavicular region 45 gray in 25 fractions, lumpectomy boost 10 gray in 5 fractions  (5) started tamoxifen 06/26/2015  (a) goserelin started 04/03/2015, last dose 10/06/2015  (b) laparoscopic bilateral salpingo-oophorectomy 11/12/2015 with benign pathology  (6)  the [employer] geneprofile  ([facility]) obtained November 2015 did not reveal a mutation in ATM, BARD1, BRCA1, BRCA BRIP1, CDH1, CHEK2, MRE11A,  MUTYH, NBN, NF1, PALB2, PTEN, RAD50, RAD51C, RAD51D, or  TP53  (7) left lumpectomy 08/17/2017 showed ductal carcinoma in situ, grade 2, estrogen and progesterone receptor negative, with close though negative margins  (8) bilateral mastectomies 09/26/2017 showed  (a) on the right, no malignancy  (b) on the left, ductal carcinoma in situ, grade 2, with a close but negative anterior margin (skin)  (9) iron deficiency anemia: On oral iron supplementation started 12/15/2018    PLAN: Evolet is now a little over a year out from her definitive surgery for breast cancer.  There is no evidence of local recurrence.  On the other hand she has iron deficiency on the basis of a very low ferritin (which was obtained after several days of oral iron supplementation).  Some of the symptoms she has been experiencing, such as fatigue and headaches, might be related to her moderate anemia, but that is improved with her starting to take oral iron (just in her multivitamin pill).  I do not have an explanation for the iron deficiency or for her weight loss.  I think the best way to start her work-up is with a referral to GI and I have placed that today.  Anticipate she will need colonoscopy and EGD.  If those are negative I will follow-up with CT scans.  I have set her up to return to see me in about 6 weeks, but I will check lab work 2 days before that visit and if everything is fine we might consider moving back to that appointment  She knows to call for any other issues that may develop before then.     Wilber Bihari, NP 12/20/18 5:10 PM Medical Oncology and Hematology [facility] [address] [place], [place] [address] Tel. [phone]    Fax. [phone]   I, Wilburn Mylar, am acting as scribe for Dr. Virgie Dad. Gail Vendetti.  I, Lurline Del MD, have reviewed the above documentation for accuracy and completeness, and I agree with the above.

## 2018-12-20 ENCOUNTER — Telehealth: Payer: Self-pay | Admitting: Oncology

## 2018-12-20 ENCOUNTER — Inpatient Hospital Stay (HOSPITAL_BASED_OUTPATIENT_CLINIC_OR_DEPARTMENT_OTHER): Payer: 59 | Admitting: Oncology

## 2018-12-20 ENCOUNTER — Encounter: Payer: Self-pay | Admitting: Oncology

## 2018-12-20 VITALS — BP 109/77 | HR 51 | Temp 98.5°F | Resp 18 | Ht 65.0 in | Wt 120.4 lb

## 2018-12-20 DIAGNOSIS — Z9013 Acquired absence of bilateral breasts and nipples: Secondary | ICD-10-CM | POA: Diagnosis not present

## 2018-12-20 DIAGNOSIS — Z79810 Long term (current) use of selective estrogen receptor modulators (SERMs): Secondary | ICD-10-CM | POA: Diagnosis not present

## 2018-12-20 DIAGNOSIS — C50412 Malignant neoplasm of upper-outer quadrant of left female breast: Secondary | ICD-10-CM | POA: Diagnosis not present

## 2018-12-20 DIAGNOSIS — Z923 Personal history of irradiation: Secondary | ICD-10-CM

## 2018-12-20 DIAGNOSIS — Z170 Estrogen receptor positive status [ER+]: Secondary | ICD-10-CM | POA: Diagnosis not present

## 2018-12-20 DIAGNOSIS — D508 Other iron deficiency anemias: Secondary | ICD-10-CM

## 2018-12-20 DIAGNOSIS — Z79899 Other long term (current) drug therapy: Secondary | ICD-10-CM | POA: Diagnosis not present

## 2018-12-20 DIAGNOSIS — Z9221 Personal history of antineoplastic chemotherapy: Secondary | ICD-10-CM | POA: Diagnosis not present

## 2018-12-20 DIAGNOSIS — D509 Iron deficiency anemia, unspecified: Secondary | ICD-10-CM | POA: Diagnosis not present

## 2018-12-20 NOTE — Telephone Encounter (Signed)
Gave avs and calendar ° °

## 2018-12-21 ENCOUNTER — Telehealth: Payer: Self-pay

## 2018-12-21 DIAGNOSIS — H524 Presbyopia: Secondary | ICD-10-CM | POA: Diagnosis not present

## 2018-12-21 NOTE — Telephone Encounter (Signed)
-----   Message from Yetta Flock, MD sent at 12/20/2018 12:06 PM EST ----- Regarding: office appointment Jan can you help book this patient in clinic with me? Can put into a banding spot if no openings in my schedule otherwise in the next 2-3 weeks. Thanks ----- Message ----- From: Chauncey Cruel, MD Sent: 12/20/2018  11:59 AM EST To: Yetta Flock, MD

## 2018-12-21 NOTE — Telephone Encounter (Signed)
Pt returned your call to confirm that date and time work for her.

## 2018-12-21 NOTE — Telephone Encounter (Signed)
Called and LM for pt that Dr. Jana Hakim asked Dr Havery Moros to see her as soon as possibele.  I have scheduled her for 1-21 at 10:30am.  Asked her to call us and confirm she can make that appt date and time.

## 2019-01-02 ENCOUNTER — Ambulatory Visit (INDEPENDENT_AMBULATORY_CARE_PROVIDER_SITE_OTHER): Payer: 59 | Admitting: Gastroenterology

## 2019-01-02 ENCOUNTER — Encounter: Payer: Self-pay | Admitting: Gastroenterology

## 2019-01-02 VITALS — BP 100/80 | HR 60 | Ht 65.0 in | Wt 118.0 lb

## 2019-01-02 DIAGNOSIS — Z791 Long term (current) use of non-steroidal anti-inflammatories (NSAID): Secondary | ICD-10-CM

## 2019-01-02 DIAGNOSIS — K5900 Constipation, unspecified: Secondary | ICD-10-CM

## 2019-01-02 DIAGNOSIS — D649 Anemia, unspecified: Secondary | ICD-10-CM | POA: Diagnosis not present

## 2019-01-02 DIAGNOSIS — K625 Hemorrhage of anus and rectum: Secondary | ICD-10-CM

## 2019-01-02 MED ORDER — POLYETHYLENE GLYCOL 3350 17 G PO PACK: 17.0000 g | PACK | Freq: Every day | ORAL | 0 refills | Status: DC

## 2019-01-02 MED ORDER — SUPREP BOWEL PREP KIT 17.5-3.13-1.6 GM/177ML PO SOLN: ORAL | 0 refills | Status: DC

## 2019-01-02 NOTE — Progress Notes (Signed)
HPI :  43 y/o female with a history of breast cancer x 2, treated with chemotherapy, XRT, surgery including laroscopic bilateral salpino-oophorectomy, referred by Dr. Lurline Del for suspected iron deficiency anemia amd weight loss.  She was noted to be anemic in December with a Hgb going from 12.3 to 10.7. She empirically started on iron prior to labs being drawn for that. After she had been on iron her Hgb rose to 11.8 but ferritin was at lowest limit of normal at 13. Dr. Jana Hakim suspected she had an iron deficiency. She does not have any menstrual cycles after her surgery. She states her bowels can be irregular at times. More recently she has had some constipation. She'll have a bowel movement once every 2 days, can be associated with some straining. She does have some scant amount of red blood noted on the toilet paper when wiping herself, she does not see any blood in the stools. She has not been taking anything for her bowels. She denies any abdominal pains. She denies any reflux symptoms, no dysphagia. No postprandial abdominal pain. No nausea, no vomiting. She does endorse a 15-20 pound weight loss over the past 6-8 months, she has not been trying to do so. She does endorse having gained weight during her breast cancer therapy and that her current weight is her typical baseline.   She has no family history of colon cancer or stomach cancer or other known breast cancer. Her father had prostate cancer. She does use Mobic daily dosed at 53m / day for chronic back and joint pains. She does not take PPI. No prior upper or lower endoscopy.   Otherwise feels at baseline today.  Past Medical History:  Diagnosis Date  . Anemia   . Benign skin lesion of forehead 10/2015  . [place]   . [place]   . History of breast cancer 09/2014   left  . History of chemotherapy    finished 02/10/2015  . History of radiation therapy 04/16/2015 - 06/02/2015  . Personal history of chemotherapy    . Personal history of radiation therapy      Past Surgical History:  Procedure Laterality Date  . AUGMENTATION MAMMAPLASTY Bilateral   . BREAST LUMPECTOMY    . BREAST RECONSTRUCTION WITH PLACEMENT OF TISSUE EXPANDER AND FLEX HD (ACELLULAR HYDRATED DERMIS) Right 09/26/2017   Procedure: RIGHT BREAST RECONSTRUCTION WITH PLACEMENT OF TISSUE EXPANDER AND FLEX HD (ACELLULAR HYDRATED DERMIS);  Surgeon: TIrene Limbo MD;  [facility]  Service: Plastics;  Laterality: Right;  . LAPAROSCOPIC OOPHERECTOMY Bilateral   . LAPAROSCOPIC SALPINGO OOPHERECTOMY Bilateral 11/12/2015   Procedure: LAPAROSCOPIC BILATERAL SALPINGO OOPHORECTOMY;  Surgeon: KPaula Compton MD;  [facility];  Service: Gynecology;  Laterality: Bilateral;  . LATISSIMUS FLAP TO BREAST Left 09/26/2017   Procedure: LATISSIMUS FLAP TO LEFT BREAST;  Surgeon: TIrene Limbo MD;  [place] [facility]  Service: Plastics;  Laterality: Left;  . LESION REMOVAL Left 10/22/2015   Procedure: EXCISION OF FOREHEAD LESION 1X1CM;  Surgeon: MRolm Bookbinder MD;  [place] [facility]  Service: General;  Laterality: Left;  .Marland KitchenMASTECTOMY Bilateral   . NIPPLE SPARING MASTECTOMY Bilateral 09/26/2017   Procedure: LEFT NIPPLE SPARING MASTECTOMY, RIGHT PROPHYLACTIC NIPPLE SPARING MASTECTOMY;  Surgeon: WRolm Bookbinder MD;  [place] [facility]  Service: General;  Laterality: Bilateral;  . PORT-A-CATH REMOVAL N/A 10/22/2015   Procedure: REMOVAL PORT-A-CATH;  Surgeon: MRolm Bookbinder MD;  [place] [facility]  Service: General;  Laterality: N/A;  .  PORTACATH PLACEMENT N/A 10/15/2014   Procedure: INSERTION PORT-A-CATH;  Surgeon: Rolm Bookbinder, MD;  [facility];  Service: General;  Laterality: N/A;  . RADIOACTIVE SEED GUIDED EXCISIONAL BREAST BIOPSY Left 08/17/2017   Procedure: LEFT RADIOACTIVE SEED GUIDED EXCISIONAL BREAST BIOPSY ERAS PATHWAY;  Surgeon: Rolm Bookbinder, MD;  Location: [facility];  Service: General;  Laterality: Left;  . RADIOACTIVE SEED GUIDED PARTIAL MASTECTOMY WITH AXILLARY SENTINEL LYMPH NODE BIOPSY Left 03/06/2015   Procedure: RADIOACTIVE SEED GUIDED PARTIAL MASTECTOMY WITH AXILLARY SENTINEL LYMPH NODE BIOPSY;  Surgeon: Rolm Bookbinder, MD;  Location: [facility];  Service: General;  Laterality: Left;  . REMOVAL OF BILATERAL TISSUE EXPANDERS WITH PLACEMENT OF BILATERAL BREAST IMPLANTS Bilateral 01/06/2018   Procedure: REMOVAL OF BILATERAL TISSUE EXPANDERS WITH PLACEMENT OF BILATERAL BREAST IMPLANTS;  Surgeon: Irene Limbo, MD;  Location: [place];  Service: Plastics;  Laterality: Bilateral;  . TONSILLECTOMY  1996   Family History  Problem Relation Age of Onset  . Skin cancer Father   . Hyperlipidemia Father   . Prostate cancer Father   . Prostate cancer Maternal Uncle 58       currently 81  . Multiple myeloma Paternal Grandmother 65       Deceased 47  . Cancer Paternal Grandmother        bone  . Prostate cancer Paternal Uncle   . Lung disease Maternal Grandmother   . AAA (abdominal aortic aneurysm) Maternal Grandmother   . Stroke Maternal Grandfather    Social History   Tobacco Use  . Smoking status: Never Smoker  . Smokeless tobacco: Never Used  Substance Use Topics  . Alcohol use: Yes    Comment: 3 x/week  . Drug use: No   Current Outpatient Medications  Medication Sig Dispense Refill  . gabapentin (NEURONTIN) 300 MG capsule TAKE 1 CAPSULE BY MOUTH DAILY AT BEDTIME 90 capsule 4  . meloxicam (MOBIC) 15 MG tablet TAKE 1 TABLET BY MOUTH EVERY DAY (Patient taking differently: Take 15 mg by mouth at bedtime) 30 tablet 1  . Multiple Vitamin (MULTI-VITAMINS) TABS Take by mouth.    . tamoxifen (NOLVADEX) 20 MG tablet Take 1 tablet (20 mg total) by mouth daily. 90 tablet 4   No current facility-administered medications for this visit.    Allergies  Allergen Reactions  . Chlorhexidine Itching and Rash  .  Morphine And Related Nausea And Vomiting  . Other Rash    Dermabond  . Tegaderm Ag Mesh [Silver] Rash    Reports that the larger tegaderm causes rashes     Review of Systems: All systems reviewed and negative except where noted in HPI.   Lab Results  Component Value Date   WBC 2.7 (L) 12/19/2018   HGB 11.8 (L) 12/19/2018   HCT 37.4 12/19/2018   MCV 90.8 12/19/2018   PLT 213 12/19/2018    Lab Results  Component Value Date   CREATININE 0.79 11/20/2018   BUN 12 11/20/2018   NA 142 11/20/2018   K 4.1 11/20/2018   CL 106 11/20/2018   CO2 27 11/20/2018    Lab Results  Component Value Date   ALT 14 11/20/2018   AST 23 11/20/2018   ALKPHOS 36 (L) 11/20/2018   BILITOT 0.4 11/20/2018     Physical Exam: BP 100/80   Pulse 60   Ht 5' 5"  (1.651 m)   Wt 118 lb (53.5 kg)   LMP 02/22/2015   BMI 19.64 kg/m  Constitutional: Pleasant,well-developed,  female in no acute distress. HEENT: Normocephalic and atraumatic. Conjunctivae are normal. No scleral icterus. Neck supple.  Cardiovascular: Normal rate, regular rhythm.  Pulmonary/chest: Effort normal and breath sounds normal. No wheezing, rales or rhonchi. Abdominal: Soft, nondistended, nontender.  There are no masses palpable. No hepatomegaly. Extremities: no edema Lymphadenopathy: No cervical adenopathy noted. Neurological: Alert and oriented to person place and time. Skin: Skin is warm and dry. No rashes noted. Psychiatric: Normal mood and affect. Behavior is normal.   ASSESSMENT AND PLAN: 43 year old female here for a new patient consultation regarding following:  Anemia - suspected iron deficiency / Constipation / Rectal bleeding / NSAID use - she does not have menstrual cycles. Hgb improved being on iron. Otherwise has some mild constipation with some scant rectal bleeding which appears long standing. In regards to her risk factors for GI tract blood loss, her daily Mobic use puts her at risk for peptic ulcer disease,  gastritis, and small bowel ulceration. While this is being sorted out I would recommend she try to either lower the dose of Mobic (7.41m) or ideally stop it altogether and use Tylenol as needed for her pain if that works. She will try holding the Mobic and using Tylenol first and see how she does with that. Otherwise I'm recommending an EGD and a colonoscopy to evaluate her bowel for iron deficiency, and clarify cause of rectal bleeding which I suspect is due to hemorrhoids. I discussed the risks and benefits of EGD and colonoscopy with her and she wanted to proceed following this discussion. Further recommendations pending the results. She is scheduled for next week, will hold off on empiric PPI if she is holding off on the Mobic. Otherwise recommend Miralax daily to twice daily as needed for her constipation. She agreed.   SCarolina Cellar MD [facility]  CC: GLurline Del

## 2019-01-02 NOTE — Patient Instructions (Addendum)
If you are age 43 or older, your body mass index should be between 23-30. Your Body mass index is 19.64 kg/m. If this is out of the aforementioned range listed, please consider follow up with your Primary Care Provider.  If you are age 50 or younger, your body mass index should be between 19-25. Your Body mass index is 19.64 kg/m. If this is out of the aformentioned range listed, please consider follow up with your Primary Care Provider.   You have been scheduled for an endoscopy and colonoscopy. Please follow the written instructions given to you at your visit today. Please pick up your prep supplies at the pharmacy within the next 1-3 days. If you use inhalers (even only as needed), please bring them with you on the day of your procedure. Your physician has requested that you go to www.startemmi.com and enter the access code given to you at your visit today. This web site gives a general overview about your procedure. However, you should still follow specific instructions given to you by our office regarding your preparation for the procedure.  Please hold your Mobic. You can take 500 mg of Tylenol 3 to 4 times a day as needed.  Please purchase the following medications over the counter and take as directed: Miralax for constipation  Thank you for entrusting me with your care and for choosing [facility], Dr. [place] Cellar

## 2019-01-10 ENCOUNTER — Telehealth: Payer: Self-pay | Admitting: Gastroenterology

## 2019-01-10 NOTE — Telephone Encounter (Signed)
Called and spoke to pt.  She may start drinking her prep a little later since she has something at 6:30pm.  All questions answered.

## 2019-01-10 NOTE — Telephone Encounter (Signed)
Pt wants to know how long after taking the surprep do is work?

## 2019-01-11 ENCOUNTER — Encounter: Payer: Self-pay | Admitting: Gastroenterology

## 2019-01-11 ENCOUNTER — Ambulatory Visit (AMBULATORY_SURGERY_CENTER): Payer: 59 | Admitting: Gastroenterology

## 2019-01-11 VITALS — BP 121/63 | HR 48 | Temp 97.3°F | Resp 13 | Ht 65.0 in | Wt 118.0 lb

## 2019-01-11 DIAGNOSIS — D123 Benign neoplasm of transverse colon: Secondary | ICD-10-CM

## 2019-01-11 DIAGNOSIS — K317 Polyp of stomach and duodenum: Secondary | ICD-10-CM

## 2019-01-11 DIAGNOSIS — D509 Iron deficiency anemia, unspecified: Secondary | ICD-10-CM | POA: Diagnosis not present

## 2019-01-11 DIAGNOSIS — K625 Hemorrhage of anus and rectum: Secondary | ICD-10-CM | POA: Diagnosis not present

## 2019-01-11 DIAGNOSIS — K3189 Other diseases of stomach and duodenum: Secondary | ICD-10-CM | POA: Diagnosis not present

## 2019-01-11 DIAGNOSIS — Z853 Personal history of malignant neoplasm of breast: Secondary | ICD-10-CM | POA: Diagnosis not present

## 2019-01-11 DIAGNOSIS — D120 Benign neoplasm of cecum: Secondary | ICD-10-CM | POA: Diagnosis not present

## 2019-01-11 DIAGNOSIS — D649 Anemia, unspecified: Secondary | ICD-10-CM | POA: Diagnosis not present

## 2019-01-11 MED ORDER — SODIUM CHLORIDE 0.9 % IV SOLN: 500.0000 mL | Freq: Once | INTRAVENOUS | Status: DC

## 2019-01-11 NOTE — Progress Notes (Signed)
Report given to PACU, vss 

## 2019-01-11 NOTE — Op Note (Signed)
[facility] Patient Name: Nancy Austin Procedure Date: 01/11/2019 3:04 PM MRN: [id number] Endoscopist: Remo Lipps P. Havery Moros , MD Age: 43 Referring MD:  Date of Birth: 23-Mar-1976 Gender: Female Account #: [id number] Procedure:                Colonoscopy Indications:              Iron deficiency anemia Medicines:                Monitored Anesthesia Care Procedure:                Pre-Anesthesia Assessment:                           - Prior to the procedure, a History and Physical                            was performed, and patient medications and                            allergies were reviewed. The patient's tolerance of                            previous anesthesia was also reviewed. The risks                            and benefits of the procedure and the sedation                            options and risks were discussed with the patient.                            All questions were answered, and informed consent                            was obtained. Prior Anticoagulants: The patient has                            taken no previous anticoagulant or antiplatelet                            agents. ASA Grade Assessment: II - A patient with                            mild systemic disease. After reviewing the risks                            and benefits, the patient was deemed in                            satisfactory condition to undergo the procedure.                           After obtaining informed consent, the colonoscope  was passed under direct vision. Throughout the                            procedure, the patient's blood pressure, pulse, and                            oxygen saturations were monitored continuously. The                            Model PCF-[id number]) scope was introduced                            through the anus and advanced to the the terminal                            ileum, with identification of  the appendiceal                            orifice and IC valve. The colonoscopy was performed                            without difficulty. The patient tolerated the                            procedure well. The quality of the bowel                            preparation was good. The terminal ileum, ileocecal                            valve, appendiceal orifice, and rectum were                            photographed. Scope In: 3:42:54 PM Scope Out: 4:05:13 PM Scope Withdrawal Time: 0 hours 19 minutes 38 seconds  Total Procedure Duration: 0 hours 22 minutes 19 seconds  Findings:                 The perianal and digital rectal examinations were                            normal.                           The terminal ileum appeared normal.                           A diminutive polyp was found in the cecum. The                            polyp was sessile. The polyp was removed with a                            cold biopsy forceps. Resection and retrieval were  complete.                           Two flat polyps were found in the transverse colon.                            The polyps were 3 to 4 mm in size. These polyps                            were removed with a cold snare. Resection and                            retrieval were complete.                           The recto-sigmoid colon and sigmoid colon were                            tortuous.                           Internal hemorrhoids were found during                            retroflexion. The hemorrhoids were small.                           The exam was otherwise without abnormality. Complications:            No immediate complications. Estimated blood loss:                            Minimal. Estimated Blood Loss:     Estimated blood loss was minimal. Impression:               - The examined portion of the ileum was normal.                           - One diminutive polyp in the cecum,  removed with a                            cold biopsy forceps. Resected and retrieved.                           - Two 3 to 4 mm polyps in the transverse colon,                            removed with a cold snare. Resected and retrieved.                           - Tortuous colon.                           - Internal hemorrhoids.                           -  The examination was otherwise normal.                           No cause for iron deficiency on this exam. Recommendation:           - Patient has a contact number available for                            emergencies. The signs and symptoms of potential                            delayed complications were discussed with the                            patient. Return to normal activities tomorrow.                            Written discharge instructions were provided to the                            patient.                           - Resume previous diet.                           - Continue present medications.                           - Await pathology results. Remo Lipps P. Deandria Klute, MD 01/11/2019 4:09:11 PM This report has been signed electronically.

## 2019-01-11 NOTE — Patient Instructions (Signed)
No NSAIDS (ibuprofen, aspirin, Aleve) for 2 weeks 3 colon polyps removed and 1 stomach polyp   YOU HAD AN ENDOSCOPIC PROCEDURE TODAY AT [facility]:   Refer to the procedure report that was given to you for any specific questions about what was found during the examination.  If the procedure report does not answer your questions, please call your gastroenterologist to clarify.  If you requested that your care partner not be given the details of your procedure findings, then the procedure report has been included in a sealed envelope for you to review at your convenience later.  YOU SHOULD EXPECT: Some feelings of bloating in the abdomen. Passage of more gas than usual.  Walking can help get rid of the air that was put into your GI tract during the procedure and reduce the bloating. If you had a lower endoscopy (such as a colonoscopy or flexible sigmoidoscopy) you may notice spotting of blood in your stool or on the toilet paper. If you underwent a bowel prep for your procedure, you may not have a normal bowel movement for a few days.  Please Note:  You might notice some irritation and congestion in your nose or some drainage.  This is from the oxygen used during your procedure.  There is no need for concern and it should clear up in a day or so.  SYMPTOMS TO REPORT IMMEDIATELY:   Following lower endoscopy (colonoscopy or flexible sigmoidoscopy):  Excessive amounts of blood in the stool  Significant tenderness or worsening of abdominal pains  Swelling of the abdomen that is new, acute  Fever of 100F or higher   Following upper endoscopy (EGD)  Vomiting of blood or coffee ground material  New chest pain or pain under the shoulder blades  Painful or persistently difficult swallowing  New shortness of breath  Fever of 100F or higher  Black, tarry-looking stools  For urgent or emergent issues, a gastroenterologist can be reached at any hour by calling [phone]  [phone].   DIET:  We do recommend a small meal at first, but then you may proceed to your regular diet.  Drink plenty of fluids but you should avoid alcoholic beverages for 24 hours.  ACTIVITY:  You should plan to take it easy for the rest of today and you should NOT DRIVE or use heavy machinery until tomorrow (because of the sedation medicines used during the test).    FOLLOW UP: Our staff will call the number listed on your records the next business day following your procedure to check on you and address any questions or concerns that you may have regarding the information given to you following your procedure. If we do not reach you, we will leave a message.  However, if you are feeling well and you are not experiencing any problems, there is no need to return our call.  We will assume that you have returned to your regular daily activities without incident.  If any biopsies were taken you will be contacted by phone or by letter within the next 1-3 weeks.  Please call us at [phone] if you have not heard about the biopsies in 3 weeks.    SIGNATURES/CONFIDENTIALITY: You and/or your care partner have signed paperwork which will be entered into your electronic medical record.  These signatures attest to the fact that that the information above on your After Visit Summary has been reviewed and is understood.  Full responsibility of the confidentiality of this discharge information  lies with you and/or your care-partner.

## 2019-01-11 NOTE — Op Note (Signed)
[facility] Patient Name: Nancy Austin Procedure Date: 01/11/2019 3:04 PM MRN: [id number] Endoscopist: Remo Lipps P. Havery Moros , MD Age: 43 Referring MD:  Date of Birth: 03-Feb-1976 Gender: Female Account #: [id number] Procedure:                Upper GI endoscopy Indications:              Iron deficiency anemia, history of NSAID Medicines:                Monitored Anesthesia Care Procedure:                Pre-Anesthesia Assessment:                           - Prior to the procedure, a History and Physical                            was performed, and patient medications and                            allergies were reviewed. The patient's tolerance of                            previous anesthesia was also reviewed. The risks                            and benefits of the procedure and the sedation                            options and risks were discussed with the patient.                            All questions were answered, and informed consent                            was obtained. Prior Anticoagulants: The patient has                            taken no previous anticoagulant or antiplatelet                            agents. ASA Grade Assessment: II - A patient with                            mild systemic disease. After reviewing the risks                            and benefits, the patient was deemed in                            satisfactory condition to undergo the procedure.                           After obtaining informed consent, the endoscope was  passed under direct vision. Throughout the                            procedure, the patient's blood pressure, pulse, and                            oxygen saturations were monitored continuously. The                            Model GIF-[id number]) scope was introduced                            through the mouth, and advanced to the second part                            of  duodenum. The upper GI endoscopy was                            accomplished without difficulty. The patient                            tolerated the procedure well. Scope In: Scope Out: Findings:                 Esophagogastric landmarks were identified: the                            Z-line was found at 39 cm, the gastroesophageal                            junction was found at 39 cm and the upper extent of                            the gastric folds was found at 39 cm from the                            incisors.                           The exam of the esophagus was otherwise normal.                           A single 7 to 8 mm sessile polyp was found on the                            greater curvature of the gastric body which was                            inflamed with a small erosion. The polyp was                            removed with a hot snare. Resection and retrieval  were complete.                           The exam of the stomach was otherwise normal.                           Biopsies were taken with a cold forceps in the                            gastric body, at the incisura and in the gastric                            antrum for Helicobacter pylori testing.                           The duodenal bulb and second portion of the                            duodenum were normal. Complications:            No immediate complications. Estimated blood loss:                            Minimal. Estimated Blood Loss:     Estimated blood loss was minimal. Impression:               - Esophagogastric landmarks identified.                           - Normal esophagus                           - A single gastric polyp. Resected and retrieved.                           - Normal stomach otherwise. Biopsies taken to rule                            out H pylori                           - Normal duodenal bulb and second portion of the                             duodenum.                           It's possible inflamed gastric polyp could have                            contributed to iron deficiency in the setting of                            NSAID use. Recommendation:           - Patient has a contact number available for  emergencies. The signs and symptoms of potential                            delayed complications were discussed with the                            patient. Return to normal activities tomorrow.                            Written discharge instructions were provided to the                            patient.                           - Resume previous diet.                           - Continue present medications.                           - Await pathology results.                           - No aspirin, ibuprofen, naproxen, or other                            non-steroidal anti-inflammatory drugs for 2 weeks                            after polyp removal. Remo Lipps P. Temiloluwa [place], MD 01/11/2019 4:14:21 PM This report has been signed electronically.

## 2019-01-12 ENCOUNTER — Telehealth: Payer: Self-pay

## 2019-01-12 NOTE — Telephone Encounter (Signed)
  Follow up Call-  Call back number 01/11/2019  Post procedure Call Back phone  # [phone]  Permission to leave phone message Yes  Some recent data might be hidden     Patient questions:  Do you have a fever, pain , or abdominal swelling? No. Pain Score  0 *  Have you tolerated food without any problems? Yes.    Have you been able to return to your normal activities? Yes.    Do you have any questions about your discharge instructions: Diet   No. Medications  No. Follow up visit  No.  Do you have questions or concerns about your Care? No.  Actions: * If pain score is 4 or above: No action needed, pain <4.

## 2019-01-19 ENCOUNTER — Other Ambulatory Visit: Payer: Self-pay

## 2019-01-19 DIAGNOSIS — D509 Iron deficiency anemia, unspecified: Secondary | ICD-10-CM

## 2019-01-19 NOTE — Progress Notes (Signed)
Cbc due in March - see result note

## 2019-01-30 ENCOUNTER — Inpatient Hospital Stay: Payer: 59 | Attending: Oncology

## 2019-01-30 DIAGNOSIS — Z79899 Other long term (current) drug therapy: Secondary | ICD-10-CM | POA: Insufficient documentation

## 2019-01-30 DIAGNOSIS — Z9013 Acquired absence of bilateral breasts and nipples: Secondary | ICD-10-CM | POA: Insufficient documentation

## 2019-01-30 DIAGNOSIS — C50412 Malignant neoplasm of upper-outer quadrant of left female breast: Secondary | ICD-10-CM | POA: Insufficient documentation

## 2019-01-30 DIAGNOSIS — Z79810 Long term (current) use of selective estrogen receptor modulators (SERMs): Secondary | ICD-10-CM | POA: Diagnosis not present

## 2019-01-30 DIAGNOSIS — Z170 Estrogen receptor positive status [ER+]: Secondary | ICD-10-CM | POA: Diagnosis not present

## 2019-01-30 DIAGNOSIS — D508 Other iron deficiency anemias: Secondary | ICD-10-CM

## 2019-01-30 LAB — CBC WITH DIFFERENTIAL (CANCER CENTER ONLY)
Abs Immature Granulocytes: 0.01 10*3/uL (ref 0.00–0.07)
BASOS PCT: 1 %
Basophils Absolute: 0 10*3/uL (ref 0.0–0.1)
EOS ABS: 0 10*3/uL (ref 0.0–0.5)
Eosinophils Relative: 1 %
HCT: 40.3 % (ref 36.0–46.0)
Hemoglobin: 12.9 g/dL (ref 12.0–15.0)
Immature Granulocytes: 0 %
Lymphocytes Relative: 38 %
Lymphs Abs: 1.3 10*3/uL (ref 0.7–4.0)
MCH: 28.2 pg (ref 26.0–34.0)
MCHC: 32 g/dL (ref 30.0–36.0)
MCV: 88 fL (ref 80.0–100.0)
Monocytes Absolute: 0.2 10*3/uL (ref 0.1–1.0)
Monocytes Relative: 5 %
Neutro Abs: 2 10*3/uL (ref 1.7–7.7)
Neutrophils Relative %: 55 %
Platelet Count: 198 10*3/uL (ref 150–400)
RBC: 4.58 MIL/uL (ref 3.87–5.11)
RDW: 13.1 % (ref 11.5–15.5)
WBC Count: 3.5 10*3/uL — ABNORMAL LOW (ref 4.0–10.5)
nRBC: 0 % (ref 0.0–0.2)

## 2019-01-30 LAB — RETICULOCYTES
IMMATURE RETIC FRACT: 5.7 % (ref 2.3–15.9)
RBC.: 4.58 MIL/uL (ref 3.87–5.11)
RETIC CT PCT: 0.7 % (ref 0.4–3.1)
Retic Count, Absolute: 29.8 10*3/uL (ref 19.0–186.0)

## 2019-01-30 LAB — FERRITIN: Ferritin: 16 ng/mL (ref 11–307)

## 2019-01-30 LAB — IRON AND TIBC
IRON: 110 ug/dL (ref 41–142)
Saturation Ratios: 32 % (ref 21–57)
TIBC: 341 ug/dL (ref 236–444)
UIBC: 232 ug/dL (ref 120–384)

## 2019-01-30 LAB — CEA (IN HOUSE-CHCC): CEA (CHCC-In House): 1.4 ng/mL (ref 0.00–5.00)

## 2019-01-31 LAB — CANCER ANTIGEN 27.29: CAN 27.29: 13.7 U/mL (ref 0.0–38.6)

## 2019-01-31 NOTE — Progress Notes (Signed)
[facility]  Telephone:([phone] Fax:([phone]) [phone]     ID: AVINA EBERLE DOB: 1976/11/10  MR#: [id number]  HYI#:[id number]  Patient Care Team: Midge Minium, MD as PCP - General (Family Medicine) Rolm Bookbinder, MD as Consulting Physician (General Surgery) Gery Pray, MD as Consulting Physician (Radiation Oncology) Magrinat, Virgie Dad, MD as Consulting Physician (Oncology) Paula Compton, MD as Consulting Physician (Obstetrics and Gynecology) Hennie Duos, MD as Consulting Physician (Rheumatology) Armbruster, Carlota Raspberry, MD as Consulting Physician (Gastroenterology) OTHER MD:   CHIEF COMPLAINT: Recurrent estrogen receptor positive breast cancer  CURRENT TREATMENT:  tamoxifen   BREAST CANCER HISTORY: From the original Intake note:  Shakara herself found a lump in her left breast early October 2015 and brought it to her gynecologist attention. On 09/20/2014 she was set up for bilateral diagnostic mammography and left breast ultrasonography of the breast Center. This was the patient's first ever mammogram. In the area of concern in the left breast there was suspicious pleomorphic calcifications spanning 1.1 cm. There was no discrete mammographic mass in this dense breasts (category C.). On physical exam, there was a palpable firm at 1.5 cm mass at the 3:00 position in the left breast. By ultrasound this was irregular and hypoechoic and measured 1.8 cm. Ultrasound of the left axilla was unremarkable. Aside from multiple cysts in the left breast there were no other findings of concern.  On the same day, 09/20/2014, the patient underwent biopsy of the left breast palpable mass. This showed ([id number] [phone]) an invasive ductal carcinoma, grade 2, estrogen receptor 100% positive, progesterone receptor 89% positive, both with strong staining intensity, with an MIB-1 of 16%. HER-2 was amplified with a signals ratio of 5.07 and a copy number per cell of 6.85.   On  09/30/2014 the patient underwent bilateral breast MRIs. This showed a 1.7 cm mass in the upper outer quadrant of the left breast. There was no other suspicious finding in either breast and no abnormal appearing adenopathy.  The patient's subsequent history is as detailed below.   INTERVAL HISTORY: Evagelia returns today for follow-up and treatment of her left sided breast cancers.   She continues on tamoxifen. She has some hot flashes, which she treats with Gabapentin.  This is not a major issue for her.  She does not have any problems with vaginal discharge. She is using the Imvexxy estradiol inserts and that is helping.  She is scheduled for a right breast ultrasound on 02/19/2019.  Since her last visit here, she had a colonoscopy and endoscopy on 01/11/2019. The pathology from this procedure showed ([id number]):  1. Surgical [P], gastric body polyp; polyp - Hyperplastic Polyp - Warthin-starry is negative for helicobacter pylori - No intestinal metaplasia, dysplasia, or malignancy 2. Surgical [P], gastric antrum and gastric body - Mild reactive gastropathy - Warthin-starry is negative for helicobacter pylori - No intestinal metaplasia, dysplasia, or malignancy 3. Surgical [P], cecum, transverse, polyp (3) - Benign colonic mucosa with lymphoid agregates   - No dysplasia or malignancy  Results for LAKEYTA, VANDENHEUVEL (MRN [id number]) as of 01/31/2019 17:53  Ref. Range 01/30/2019 11:34  CA 27.29 Latest Ref Range: 0.0 - 38.6 U/mL 13.7  CEA ([facility]) Latest Ref Range: 0.00 - 5.00 ng/mL 1.40     REVIEW OF SYSTEMS: Nicolina has some pain in her hands, back, and hip because she was informed to stop taking Meloxicam (originally prescribed by Dr. Amil Amen) by Dr. Havery Moros as a safeguard against polyps. She has been taking  Tylenol, with little relief. She likes to run for exercise. She has a medical mission trip for breast cancer planned to [place]; they will be doing ultrasounds and clinical  breast exam with core biopsies. The patient denies unusual headaches, visual changes, nausea, vomiting, or dizziness. There has been no unusual cough, phlegm production, or pleurisy. This been no change in bowel or bladder habits. The patient denies unexplained fatigue or unexplained weight loss, bleeding, rash, or fever. A detailed review of systems was otherwise noncontributory.   27 and 43 y/o children.    PAST MEDICAL HISTORY: Past Medical History:  Diagnosis Date  . Allergy   . Anemia   . Benign skin lesion of forehead 10/2015  . Breast cancer ([place])   . Cancer ([place])   . History of breast cancer 09/2014   left  . History of chemotherapy    finished 02/10/2015  . History of radiation therapy 04/16/2015 - 06/02/2015  . Personal history of chemotherapy   . Personal history of radiation therapy     PAST SURGICAL HISTORY: Past Surgical History:  Procedure Laterality Date  . AUGMENTATION MAMMAPLASTY Bilateral   . BREAST LUMPECTOMY    . BREAST RECONSTRUCTION WITH PLACEMENT OF TISSUE EXPANDER AND FLEX HD (ACELLULAR HYDRATED DERMIS) Right 09/26/2017   Procedure: RIGHT BREAST RECONSTRUCTION WITH PLACEMENT OF TISSUE EXPANDER AND FLEX HD (ACELLULAR HYDRATED DERMIS);  Surgeon: Irene Limbo, MD;  Location: [facility];  Service: Plastics;  Laterality: Right;  . LAPAROSCOPIC OOPHERECTOMY Bilateral   . LAPAROSCOPIC SALPINGO OOPHERECTOMY Bilateral 11/12/2015   Procedure: LAPAROSCOPIC BILATERAL SALPINGO OOPHORECTOMY;  Surgeon: Paula Compton, MD;  Location: [facility];  Service: Gynecology;  Laterality: Bilateral;  . LATISSIMUS FLAP TO BREAST Left 09/26/2017   Procedure: LATISSIMUS FLAP TO LEFT BREAST;  Surgeon: Irene Limbo, MD;  Location: [facility];  Service: Plastics;  Laterality: Left;  . LESION REMOVAL Left 10/22/2015   Procedure: EXCISION OF FOREHEAD LESION 1X1CM;  Surgeon: Rolm Bookbinder, MD;  Location: [facility];  Service: General;  Laterality: Left;  Marland Kitchen MASTECTOMY Bilateral     . NIPPLE SPARING MASTECTOMY Bilateral 09/26/2017   Procedure: LEFT NIPPLE SPARING MASTECTOMY, RIGHT PROPHYLACTIC NIPPLE SPARING MASTECTOMY;  Surgeon: Rolm Bookbinder, MD;  Location: [facility];  Service: General;  Laterality: Bilateral;  . PORT-A-CATH REMOVAL N/A 10/22/2015   Procedure: REMOVAL PORT-A-CATH;  Surgeon: Rolm Bookbinder, MD;  Location: [facility];  Service: General;  Laterality: N/A;  . PORTACATH PLACEMENT N/A 10/15/2014   Procedure: INSERTION PORT-A-CATH;  Surgeon: Rolm Bookbinder, MD;  Location: [facility];  Service: General;  Laterality: N/A;  . RADIOACTIVE SEED GUIDED EXCISIONAL BREAST BIOPSY Left 08/17/2017   Procedure: LEFT RADIOACTIVE SEED GUIDED EXCISIONAL BREAST BIOPSY ERAS PATHWAY;  Surgeon: Rolm Bookbinder, MD;  Location: [facility];  Service: General;  Laterality: Left;  . RADIOACTIVE SEED GUIDED PARTIAL MASTECTOMY WITH AXILLARY SENTINEL LYMPH NODE BIOPSY Left 03/06/2015   Procedure: RADIOACTIVE SEED GUIDED PARTIAL MASTECTOMY WITH AXILLARY SENTINEL LYMPH NODE BIOPSY;  Surgeon: Rolm Bookbinder, MD;  Location: [facility];  Service: General;  Laterality: Left;  . REMOVAL OF BILATERAL TISSUE EXPANDERS WITH PLACEMENT OF BILATERAL BREAST IMPLANTS Bilateral 01/06/2018   Procedure: REMOVAL OF BILATERAL TISSUE EXPANDERS WITH PLACEMENT OF BILATERAL BREAST IMPLANTS;  Surgeon: Irene Limbo, MD;  Location: [facility];  Service: Plastics;  Laterality: Bilateral;  . TONSILLECTOMY  1996    FAMILY HISTORY: Family History  Problem Relation Age of Onset  . Skin cancer Father   . Hyperlipidemia  Father   . Prostate cancer Father   . Prostate cancer Maternal Uncle 22       currently 52  . Multiple myeloma Paternal Grandmother 45       Deceased 44  . Cancer Paternal Grandmother        bone  . Prostate cancer Paternal Uncle   . Lung disease Maternal Grandmother   . AAA (abdominal aortic aneurysm) Maternal Grandmother   .  Stroke Maternal Grandfather   . Colon cancer Neg Hx   . Esophageal cancer Neg Hx   . Rectal cancer Neg Hx   . Stomach cancer Neg Hx    The patient's parents are both living. The patient has one brother, no sisters. One grandmother was diagnosed with multiple myeloma at age 85. There is no history of breast or ovarian cancer in the family.    GYNECOLOGIC HISTORY:  Patient's last menstrual period was 02/22/2015.  Menarche age 15, first live birth age 19. The patient is GX P2. She was still having regular periods at the start of chemotherapy. She was on birth control pills on and off for the last 15 years, stopping in October of 2015.    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 Her husband Rolla Plate works as a Immunologist at [employer]. Their children are aged 38, and 7 as of August 2018   ADVANCED DIRECTIVES: In place   HEALTH MAINTENANCE: Social History   Tobacco Use  . Smoking status: Never Smoker  . Smokeless tobacco: Never Used  Substance Use Topics  . Alcohol use: Yes    Comment: 3 x/week  . Drug use: No    Colonoscopy:  PAP:  November 2014  Bone density:  Lipid panel:  Allergies  Allergen Reactions  . Chlorhexidine Itching and Rash  . Morphine And Related Nausea And Vomiting  . Other Rash    Dermabond  . Tegaderm Ag Mesh [Silver] Rash    Reports that the larger tegaderm causes rashes    Current Outpatient Medications  Medication Sig Dispense Refill  . cholecalciferol (VITAMIN D3) 25 MCG (1000 UT) tablet Take 1 tablet (1,000 Units total) by mouth daily.    Marland Kitchen gabapentin (NEURONTIN) 300 MG capsule TAKE 1 CAPSULE BY MOUTH DAILY AT BEDTIME 90 capsule 4  . Multiple Vitamin (MULTI-VITAMINS) TABS Take by mouth.    . tamoxifen (NOLVADEX) 20 MG tablet Take 1 tablet (20 mg total) by mouth daily. 90 tablet 4   No current facility-administered medications for this visit.     OBJECTIVE: Young appearing white woman who appears well Vitals:    02/01/19 0850  BP: 122/74  Pulse: (!) 45  Resp: 18  Temp: 98.7 F (37.1 C)  SpO2: 100%     Body mass index is 19.57 kg/m.    ECOG FS:1 - Symptomatic but completely ambulatory   Filed Weights   02/01/19 0850  Weight: 117 lb 9.6 oz (53.3 kg)   Sclerae unicteric, pupils round and equal No cervical or supraclavicular adenopathy Lungs no rales or rhonchi Heart regular rate and rhythm Abd soft, nontender, positive bowel sounds MSK no focal spinal tenderness, no upper extremity lymphedema Neuro: nonfocal, well oriented, appropriate affect Breasts: Status post bilateral mastectomies with bilateral implant reconstruction.  No evidence of local recurrence.  Both axillae are benign.  LAB RESULTS:  CMP     Component Value Date/Time   NA 142 11/20/2018 1103   NA 141 08/02/2017 1536   K  4.1 11/20/2018 1103   K 4.0 08/02/2017 1536   CL 106 11/20/2018 1103   CO2 27 11/20/2018 1103   CO2 32 (H) 08/02/2017 1536   GLUCOSE 90 11/20/2018 1103   GLUCOSE 93 08/02/2017 1536   BUN 12 11/20/2018 1103   BUN 11.4 08/02/2017 1536   CREATININE 0.79 11/20/2018 1103   CREATININE 0.8 08/02/2017 1536   CALCIUM 9.0 11/20/2018 1103   CALCIUM 9.8 08/02/2017 1536   PROT 6.4 (L) 11/20/2018 1103   PROT 6.7 08/02/2017 1536   ALBUMIN 4.0 11/20/2018 1103   ALBUMIN 3.9 08/02/2017 1536   AST 23 11/20/2018 1103   AST 22 08/02/2017 1536   ALT 14 11/20/2018 1103   ALT 12 08/02/2017 1536   ALKPHOS 36 (L) 11/20/2018 1103   ALKPHOS 51 08/02/2017 1536   BILITOT 0.4 11/20/2018 1103   BILITOT 0.31 08/02/2017 1536   GFRNONAA >60 11/20/2018 1103   GFRAA >60 11/20/2018 1103    I No results found for: SPEP  Lab Results  Component Value Date   WBC 3.5 (L) 01/30/2019   NEUTROABS 2.0 01/30/2019   HGB 12.9 01/30/2019   HCT 40.3 01/30/2019   MCV 88.0 01/30/2019   PLT 198 01/30/2019      Chemistry      Component Value Date/Time   NA 142 11/20/2018 1103   NA 141 08/02/2017 1536   K 4.1 11/20/2018 1103    K 4.0 08/02/2017 1536   CL 106 11/20/2018 1103   CO2 27 11/20/2018 1103   CO2 32 (H) 08/02/2017 1536   BUN 12 11/20/2018 1103   BUN 11.4 08/02/2017 1536   CREATININE 0.79 11/20/2018 1103   CREATININE 0.8 08/02/2017 1536      Component Value Date/Time   CALCIUM 9.0 11/20/2018 1103   CALCIUM 9.8 08/02/2017 1536   ALKPHOS 36 (L) 11/20/2018 1103   ALKPHOS 51 08/02/2017 1536   AST 23 11/20/2018 1103   AST 22 08/02/2017 1536   ALT 14 11/20/2018 1103   ALT 12 08/02/2017 1536   BILITOT 0.4 11/20/2018 1103   BILITOT 0.31 08/02/2017 1536       No results found for: LABCA2  No components found for: LABCA125  No results for input(s): INR in the last 168 hours.  Urinalysis No results found for: COLORURINE   STUDIES: No results found.    ASSESSMENT: 43 y.o. BRCA negative [place] woman s/p Left breast upper outer quadrant biopsy 09/20/2014, for a clinical T1c N0, stage IA invasive ductal carcinoma, grade 2, estrogen and progesterone receptor positive, HER-2 amplified with a signal is ratio of 5.07, and an MIB-1 of 16%  (1) neoadjuvant treatment consisting of carboplatin, docetaxel, trastuzumab and pertuzumab given every 21 days x6,  completed 02/10/2015  (a) breast MRI 02/11/2015 showed a complete radiologic response  (2) left lumpectomy and sentinel lymph node sampling 03/06/2015 showed a residual ypT1c ypN1a, stage IIA invasive ductal carcinoma, grade 1, with repeat prognostic panel still triple positive, and negative margins  (3) trastuzumab continued to total one year (last dose 10/01/2015)  (a) final echo 08/07/2015 finds an ejection fraction of 55-60%.  (4) adjuvant radiation 04/16/2015-06/02/2015 Left breast 50.4 gray in 28 fractions, axillary/supraclavicular region 45 gray in 25 fractions, lumpectomy boost 10 gray in 5 fractions  (5) started tamoxifen 06/26/2015  (a) goserelin started 04/03/2015, last dose 10/06/2015  (b) laparoscopic bilateral salpingo-oophorectomy  11/12/2015 with benign pathology  (6)  the BreastNext geneprofile  ([facility]) obtained November 2015 did not reveal a mutation in  ATM, BARD1, BRCA1, BRCA BRIP1, CDH1, CHEK2, MRE11A, MUTYH, NBN, NF1, PALB2, PTEN, RAD50, RAD51C, RAD51D, or TP53  (7) left lumpectomy 08/17/2017 showed ductal carcinoma in situ, grade 2, estrogen and progesterone receptor negative, with close though negative margins  (8) bilateral mastectomies 09/26/2017 showed  (a) on the right, no malignancy  (b) on the left, ductal carcinoma in situ, grade 2, with a close but negative anterior margin (skin)  (9) iron deficiency anemia: On oral iron supplementation started 12/15/2018    PLAN: Minsa is now a year and a half out from her definitive surgery for noninvasive breast cancer, with no evidence of disease activity.  She continues on tamoxifen and the plan is to continue that a minimum of 5 years, more likely 10 years.  While she is on tamoxifen it is very safe for her to continue to use the estrogen vaginal inserts, which do seem to be helping.  It would be prudent for her to start vitamin D supplementation daily and I have written for that.  She already has an excellent exercise program.  She is planning to get a bone density through Dr. Nena Alexander office and she will make sure we get a copy  If we have any materials or medications that she can use for her upcoming mission trip we will let her know.  Otherwise she will return to see me in 1 year.  She knows to call for any issue that may develop before the next visit.  Magrinat, Virgie Dad, MD  02/01/19 9:19 AM Medical Oncology and Hematology [facility] [address] [place], [place] [address] Tel. [phone]    Fax. [phone]  I, Jacqualyn Posey am acting as a Education administrator for Chauncey Cruel, MD.   I, Lurline Del MD, have reviewed the above documentation for accuracy and completeness, and I agree with the above.

## 2019-02-01 ENCOUNTER — Inpatient Hospital Stay (HOSPITAL_BASED_OUTPATIENT_CLINIC_OR_DEPARTMENT_OTHER): Payer: 59 | Admitting: Oncology

## 2019-02-01 ENCOUNTER — Telehealth: Payer: Self-pay | Admitting: Pharmacist

## 2019-02-01 VITALS — BP 122/74 | HR 45 | Temp 98.7°F | Resp 18 | Ht 65.0 in | Wt 117.6 lb

## 2019-02-01 DIAGNOSIS — Z9221 Personal history of antineoplastic chemotherapy: Secondary | ICD-10-CM

## 2019-02-01 DIAGNOSIS — Z9013 Acquired absence of bilateral breasts and nipples: Secondary | ICD-10-CM | POA: Diagnosis not present

## 2019-02-01 DIAGNOSIS — Z79899 Other long term (current) drug therapy: Secondary | ICD-10-CM

## 2019-02-01 DIAGNOSIS — Z923 Personal history of irradiation: Secondary | ICD-10-CM | POA: Diagnosis not present

## 2019-02-01 DIAGNOSIS — C50412 Malignant neoplasm of upper-outer quadrant of left female breast: Secondary | ICD-10-CM

## 2019-02-01 DIAGNOSIS — Z79810 Long term (current) use of selective estrogen receptor modulators (SERMs): Secondary | ICD-10-CM | POA: Diagnosis not present

## 2019-02-01 DIAGNOSIS — D0512 Intraductal carcinoma in situ of left breast: Secondary | ICD-10-CM

## 2019-02-01 DIAGNOSIS — Z170 Estrogen receptor positive status [ER+]: Secondary | ICD-10-CM | POA: Diagnosis not present

## 2019-02-01 MED ORDER — TAMOXIFEN CITRATE 20 MG PO TABS: 20.0000 mg | ORAL_TABLET | Freq: Every day | ORAL | 4 refills | Status: DC

## 2019-02-01 NOTE — Telephone Encounter (Signed)
Oral Oncology Pharmacist Encounter  Received call from MD with questions about supplies of endocrine agents that may be available for patient to take on mission trip. We do not currently have any endocrine agents available for donation. I LVM for patient with this information and direct dial to oral oncology clinic for any any additional questions.  Johny Drilling, PharmD, BCPS, BCOP  02/01/2019 9:26 AM Oral Oncology Clinic [phone]

## 2019-02-05 DIAGNOSIS — Z8261 Family history of arthritis: Secondary | ICD-10-CM | POA: Diagnosis not present

## 2019-02-05 DIAGNOSIS — M47818 Spondylosis without myelopathy or radiculopathy, sacral and sacrococcygeal region: Secondary | ICD-10-CM | POA: Diagnosis not present

## 2019-02-05 DIAGNOSIS — L601 Onycholysis: Secondary | ICD-10-CM | POA: Diagnosis not present

## 2019-02-05 DIAGNOSIS — K317 Polyp of stomach and duodenum: Secondary | ICD-10-CM | POA: Diagnosis not present

## 2019-02-05 DIAGNOSIS — C50912 Malignant neoplasm of unspecified site of left female breast: Secondary | ICD-10-CM | POA: Diagnosis not present

## 2019-02-05 DIAGNOSIS — M2550 Pain in unspecified joint: Secondary | ICD-10-CM | POA: Diagnosis not present

## 2019-02-05 DIAGNOSIS — Z170 Estrogen receptor positive status [ER+]: Secondary | ICD-10-CM | POA: Diagnosis not present

## 2019-02-05 DIAGNOSIS — Z681 Body mass index (BMI) 19 or less, adult: Secondary | ICD-10-CM | POA: Diagnosis not present

## 2019-02-07 ENCOUNTER — Other Ambulatory Visit: Payer: Self-pay | Admitting: Obstetrics and Gynecology

## 2019-02-07 DIAGNOSIS — Z13820 Encounter for screening for osteoporosis: Secondary | ICD-10-CM

## 2019-02-19 ENCOUNTER — Other Ambulatory Visit: Payer: Self-pay | Admitting: Adult Health

## 2019-02-19 ENCOUNTER — Ambulatory Visit
Admission: RE | Admit: 2019-02-19 | Discharge: 2019-02-19 | Disposition: A | Discharge status: Home / Self Care | Payer: 59 | Source: Ambulatory Visit | Attending: Adult Health | Admitting: Adult Health

## 2019-02-19 DIAGNOSIS — N6310 Unspecified lump in the right breast, unspecified quadrant: Secondary | ICD-10-CM

## 2019-02-19 DIAGNOSIS — N6311 Unspecified lump in the right breast, upper outer quadrant: Secondary | ICD-10-CM | POA: Diagnosis not present

## 2019-02-26 ENCOUNTER — Other Ambulatory Visit: Payer: Self-pay | Admitting: Oncology

## 2019-05-03 ENCOUNTER — Encounter: Payer: 59 | Admitting: Family Medicine

## 2019-05-11 ENCOUNTER — Other Ambulatory Visit: Payer: 59

## 2019-07-04 ENCOUNTER — Ambulatory Visit
Admission: RE | Admit: 2019-07-04 | Discharge: 2019-07-04 | Disposition: A | Discharge status: Home / Self Care | Payer: 59 | Source: Ambulatory Visit | Attending: Obstetrics and Gynecology | Admitting: Obstetrics and Gynecology

## 2019-07-04 ENCOUNTER — Other Ambulatory Visit: Payer: Self-pay

## 2019-07-04 DIAGNOSIS — Z780 Asymptomatic menopausal state: Secondary | ICD-10-CM | POA: Diagnosis not present

## 2019-07-04 DIAGNOSIS — Z13820 Encounter for screening for osteoporosis: Secondary | ICD-10-CM

## 2019-07-04 DIAGNOSIS — M8588 Other specified disorders of bone density and structure, other site: Secondary | ICD-10-CM | POA: Diagnosis not present

## 2019-08-06 ENCOUNTER — Ambulatory Visit (INDEPENDENT_AMBULATORY_CARE_PROVIDER_SITE_OTHER): Payer: 59 | Admitting: Family Medicine

## 2019-08-06 ENCOUNTER — Other Ambulatory Visit: Payer: Self-pay

## 2019-08-06 ENCOUNTER — Encounter: Payer: Self-pay | Admitting: Family Medicine

## 2019-08-06 VITALS — BP 118/72 | HR 51 | Temp 97.9°F | Resp 16 | Ht 65.0 in | Wt 118.4 lb

## 2019-08-06 DIAGNOSIS — Z0000 Encounter for general adult medical examination without abnormal findings: Secondary | ICD-10-CM

## 2019-08-06 DIAGNOSIS — M47818 Spondylosis without myelopathy or radiculopathy, sacral and sacrococcygeal region: Secondary | ICD-10-CM | POA: Diagnosis not present

## 2019-08-06 DIAGNOSIS — D701 Agranulocytosis secondary to cancer chemotherapy: Secondary | ICD-10-CM

## 2019-08-06 DIAGNOSIS — L601 Onycholysis: Secondary | ICD-10-CM | POA: Diagnosis not present

## 2019-08-06 DIAGNOSIS — K317 Polyp of stomach and duodenum: Secondary | ICD-10-CM | POA: Diagnosis not present

## 2019-08-06 DIAGNOSIS — C50912 Malignant neoplasm of unspecified site of left female breast: Secondary | ICD-10-CM | POA: Diagnosis not present

## 2019-08-06 DIAGNOSIS — E559 Vitamin D deficiency, unspecified: Secondary | ICD-10-CM | POA: Diagnosis not present

## 2019-08-06 DIAGNOSIS — T451X5A Adverse effect of antineoplastic and immunosuppressive drugs, initial encounter: Secondary | ICD-10-CM | POA: Diagnosis not present

## 2019-08-06 DIAGNOSIS — M2550 Pain in unspecified joint: Secondary | ICD-10-CM | POA: Diagnosis not present

## 2019-08-06 DIAGNOSIS — Z170 Estrogen receptor positive status [ER+]: Secondary | ICD-10-CM | POA: Diagnosis not present

## 2019-08-06 DIAGNOSIS — Z8261 Family history of arthritis: Secondary | ICD-10-CM | POA: Diagnosis not present

## 2019-08-06 DIAGNOSIS — G620 Drug-induced polyneuropathy: Secondary | ICD-10-CM

## 2019-08-06 DIAGNOSIS — Z681 Body mass index (BMI) 19 or less, adult: Secondary | ICD-10-CM | POA: Diagnosis not present

## 2019-08-06 LAB — CBC WITH DIFFERENTIAL/PLATELET
Basophils Absolute: 0 10*3/uL (ref 0.0–0.1)
Basophils Relative: 1 % (ref 0.0–3.0)
Eosinophils Absolute: 0 10*3/uL (ref 0.0–0.7)
Eosinophils Relative: 0.6 % (ref 0.0–5.0)
HCT: 38.6 % (ref 36.0–46.0)
Hemoglobin: 12.8 g/dL (ref 12.0–15.0)
Lymphocytes Relative: 38.5 % (ref 12.0–46.0)
Lymphs Abs: 1.2 10*3/uL (ref 0.7–4.0)
MCHC: 33.1 g/dL (ref 30.0–36.0)
MCV: 90.1 fl (ref 78.0–100.0)
Monocytes Absolute: 0.2 10*3/uL (ref 0.1–1.0)
Monocytes Relative: 5.2 % (ref 3.0–12.0)
Neutro Abs: 1.7 10*3/uL (ref 1.4–7.7)
Neutrophils Relative %: 54.7 % (ref 43.0–77.0)
Platelets: 205 10*3/uL (ref 150.0–400.0)
RBC: 4.28 Mil/uL (ref 3.87–5.11)
RDW: 13.3 % (ref 11.5–15.5)
WBC: 3.1 10*3/uL — ABNORMAL LOW (ref 4.0–10.5)

## 2019-08-06 LAB — HEPATIC FUNCTION PANEL
ALT: 13 U/L (ref 0–35)
AST: 25 U/L (ref 0–37)
Albumin: 4.6 g/dL (ref 3.5–5.2)
Alkaline Phosphatase: 39 U/L (ref 39–117)
Bilirubin, Direct: 0.1 mg/dL (ref 0.0–0.3)
Total Bilirubin: 0.4 mg/dL (ref 0.2–1.2)
Total Protein: 6.7 g/dL (ref 6.0–8.3)

## 2019-08-06 LAB — BASIC METABOLIC PANEL
BUN: 14 mg/dL (ref 6–23)
CO2: 29 mEq/L (ref 19–32)
Calcium: 9.3 mg/dL (ref 8.4–10.5)
Chloride: 103 mEq/L (ref 96–112)
Creatinine, Ser: 0.71 mg/dL (ref 0.40–1.20)
GFR: 89.6 mL/min (ref >60.00)
Glucose, Bld: 96 mg/dL (ref 70–99)
Potassium: 4.1 mEq/L (ref 3.5–5.1)
Sodium: 140 mEq/L (ref 135–145)

## 2019-08-06 NOTE — Progress Notes (Signed)
   Subjective:    Patient ID: Nancy Austin, female    DOB: [birth year], 43 y.o.   MRN: [id number]  HPI CPE- UTD on GYN, Tdap.  Wants to wait on flu shot.   Review of Systems Patient reports no vision/ hearing changes, adenopathy,fever, weight change,  persistant/recurrent hoarseness , swallowing issues, chest pain, palpitations, edema, persistant/recurrent cough, hemoptysis, dyspnea (rest/exertional/paroxysmal nocturnal), gastrointestinal bleeding (melena, rectal bleeding), abdominal pain, significant heartburn, bowel changes, GU symptoms (dysuria, hematuria, incontinence), Gyn symptoms (abnormal  bleeding, pain),  syncope, focal weakness, memory loss, numbness & tingling, skin/hair/nail changes, abnormal bruising or bleeding, anxiety, or depression.     Objective:   Physical Exam General Appearance:    Alert, cooperative, no distress, appears stated age  Head:    Normocephalic, without obvious abnormality, atraumatic  Eyes:    PERRL, conjunctiva/corneas clear, EOM's intact, fundi    benign, both eyes  Ears:    Normal TM's and external ear canals, both ears  Nose:   Nares normal, septum midline, mucosa normal, no drainage    or sinus tenderness  Throat:   Lips, mucosa, and tongue normal; teeth and gums normal  Neck:   Supple, symmetrical, trachea midline, no adenopathy;    Thyroid: no enlargement/tenderness/nodules  Back:     Symmetric, no curvature, ROM normal, no CVA tenderness  Lungs:     Clear to auscultation bilaterally, respirations unlabored  Chest Wall:    No tenderness or deformity   Heart:    Regular rate and rhythm, S1 and S2 normal, no murmur, rub   or gallop  Breast Exam:    Deferred to GYN  Abdomen:     Soft, non-tender, bowel sounds active all four quadrants,    no masses, no organomegaly  Genitalia:    Deferred to GYN  Rectal:    Extremities:   Extremities normal, atraumatic, no cyanosis or edema  Pulses:   2+ and symmetric all extremities  Skin:   Skin color,  texture, turgor normal, no rashes or lesions  Lymph nodes:   Cervical, supraclavicular, and axillary nodes normal  Neurologic:   CNII-XII intact, normal strength, sensation and reflexes    throughout          Assessment & Plan:

## 2019-08-06 NOTE — Assessment & Plan Note (Signed)
Recheck CBC. 

## 2019-08-06 NOTE — Assessment & Plan Note (Signed)
Chronic problem.  On Gabapentin.  Sxs are currently stable

## 2019-08-06 NOTE — Assessment & Plan Note (Signed)
Pt has hx of this.  Check labs and replete prn. 

## 2019-08-06 NOTE — Assessment & Plan Note (Signed)
Pt's PE WNL.  UTD on GYN, immunizations.  Check labs.  Anticipatory guidance provided.  

## 2019-08-06 NOTE — Patient Instructions (Signed)
Follow up in 1 year or as needed We'll notify you of your lab results and make any changes if needed Keep up the good work!  You look great! Call with any questions or concerns Stay Safe!!! 

## 2019-08-07 ENCOUNTER — Encounter: Payer: Self-pay | Admitting: General Practice

## 2019-08-07 LAB — TSH: TSH: 1.92 u[IU]/mL (ref 0.35–4.50)

## 2019-08-07 LAB — VITAMIN D 25 HYDROXY (VIT D DEFICIENCY, FRACTURES): VITD: 44.75 ng/mL (ref 30.00–100.00)

## 2019-08-11 NOTE — Progress Notes (Signed)
Nancy Austin  [phone] Fax:([phone]     ID: Nancy Austin DOB: 24-May-1976  MR#: [id number]  VQM#:[id number]  Patient Care Team: Midge Minium, MD as PCP - General (Family Medicine) Rolm Bookbinder, MD as Consulting Physician (General Surgery) Gery Pray, MD as Consulting Physician (Radiation Oncology) Magrinat, Virgie Dad, MD as Consulting Physician (Oncology) Paula Compton, MD as Consulting Physician (Obstetrics and Gynecology) Hennie Duos, MD as Consulting Physician (Rheumatology) Armbruster, Carlota Raspberry, MD as Consulting Physician (Gastroenterology) OTHER MD:   CHIEF COMPLAINT: Recurrent estrogen receptor positive breast cancer  CURRENT TREATMENT:  tamoxifen   BREAST CANCER HISTORY: From the original Intake note:  Nancy Austin herself found a lump in her left breast early October 2015 and brought it to her gynecologist attention. On 09/20/2014 she was set up for bilateral diagnostic mammography and left breast ultrasonography of the breast Center. This was the patient's first ever mammogram. In the area of concern in the left breast there was suspicious pleomorphic calcifications spanning 1.1 cm. There was no discrete mammographic mass in this dense breasts (category C.). On physical exam, there was a palpable firm at 1.5 cm mass at the 3:00 position in the left breast. By ultrasound this was irregular and hypoechoic and measured 1.8 cm. Ultrasound of the left axilla was unremarkable. Aside from multiple cysts in the left breast there were no other findings of concern.  On the same day, 09/20/2014, the patient underwent biopsy of the left breast palpable mass. This showed ([phone]) an invasive ductal carcinoma, grade 2, estrogen receptor 100% positive, progesterone receptor 89% positive, both with strong staining intensity, with an MIB-1 of 16%. HER-2 was amplified with a signals ratio of 5.07 and a copy number per cell of 6.85.   On  09/30/2014 the patient underwent bilateral breast MRIs. This showed a 1.7 cm mass in the upper outer quadrant of the left breast. There was no other suspicious finding in either breast and no abnormal appearing adenopathy.  The patient's subsequent history is as detailed below.   INTERVAL HISTORY: Nancy Austin returns today for follow-up and treatment of her left sided breast cancers. She was last seen here on 02/01/2019.   She continues on tamoxifen.  She generally tolerates this well.  She has minimal hot flashes.  She does have some vaginal wetness which does not really help the underlying vaginal dryness problem.  Since her last visit here, she palpated a change in her right breast.  She underwent a right breast ultrasound on 02/19/2019 showing: On physical exam, there is an approximately 10 x 4 mm obliquely oriented, linear mass-like area in the 9:30 o'clock position of the right breast, 5 cm from the nipple, corresponding to the mass felt by the patient.Targeted ultrasound is performed, showing a 1.6 x 1.1 x 0.2 cm elongated, oval, medium echotexture mass-like area abutting the implant in the 9:30 o'clock position of the right breast, 5 cm from the nipple. This measured 2.1 x 1.5 x 0.3 cm on 08/18/2018 and was more heterogeneous in echotexture at that time. The exact measurements were difficult to determine in the radial plane previously and to a lesser degree, currently, making the comparison measurements less accurate.  This was felt to be either scar tissue or lipoma and she is set up for repeat right breast ultrasonography in September 08/24/2019.  Nancy Austin underwent a bone density screening on 07/04/2019, showing a T-score of -1.2, which is considered osteopenic.     Lab Results  Component Value  Date   CA2729 13.7 01/30/2019       REVIEW OF SYSTEMS: Nancy Austin runs and walks regularly for exercise and also does some weights at home.  Of course she and the children are pretty much at the  house and the kids seem to be doing okay with virtual school although the younger one finds it more difficult.  Logan of course continues to work in the operating room and there will taking careful pandemic precautions.  A detailed review of systems today was otherwise stable.    PAST MEDICAL HISTORY: Past Medical History:  Diagnosis Date  . Allergy   . Anemia   . Benign skin lesion of forehead 10/2015  . Breast cancer ([place])   . Cancer ([place])   . History of breast cancer 09/2014   left  . History of chemotherapy    finished 02/10/2015  . History of radiation therapy 04/16/2015 - 06/02/2015  . Personal history of chemotherapy   . Personal history of radiation therapy     PAST SURGICAL HISTORY: Past Surgical History:  Procedure Laterality Date  . AUGMENTATION MAMMAPLASTY Bilateral   . BREAST LUMPECTOMY    . BREAST RECONSTRUCTION WITH PLACEMENT OF TISSUE EXPANDER AND FLEX HD (ACELLULAR HYDRATED DERMIS) Right 09/26/2017   Procedure: RIGHT BREAST RECONSTRUCTION WITH PLACEMENT OF TISSUE EXPANDER AND FLEX HD (ACELLULAR HYDRATED DERMIS);  Surgeon: Irene Limbo, MD;  Location: [facility];  Service: Plastics;  Laterality: Right;  . LAPAROSCOPIC OOPHERECTOMY Bilateral   . LAPAROSCOPIC SALPINGO OOPHERECTOMY Bilateral 11/12/2015   Procedure: LAPAROSCOPIC BILATERAL SALPINGO OOPHORECTOMY;  Surgeon: Paula Compton, MD;  Location: [facility];  Service: Gynecology;  Laterality: Bilateral;  . LATISSIMUS FLAP TO BREAST Left 09/26/2017   Procedure: LATISSIMUS FLAP TO LEFT BREAST;  Surgeon: Irene Limbo, MD;  Location: [facility];  Service: Plastics;  Laterality: Left;  . LESION REMOVAL Left 10/22/2015   Procedure: EXCISION OF FOREHEAD LESION 1X1CM;  Surgeon: Rolm Bookbinder, MD;  Location: [facility];  Service: General;  Laterality: Left;  Marland Kitchen MASTECTOMY Bilateral   . NIPPLE SPARING MASTECTOMY Bilateral 09/26/2017   Procedure: LEFT NIPPLE SPARING MASTECTOMY, RIGHT PROPHYLACTIC NIPPLE SPARING  MASTECTOMY;  Surgeon: Rolm Bookbinder, MD;  Location: [facility];  Service: General;  Laterality: Bilateral;  . PORT-A-CATH REMOVAL N/A 10/22/2015   Procedure: REMOVAL PORT-A-CATH;  Surgeon: Rolm Bookbinder, MD;  Location: [facility];  Service: General;  Laterality: N/A;  . PORTACATH PLACEMENT N/A 10/15/2014   Procedure: INSERTION PORT-A-CATH;  Surgeon: Rolm Bookbinder, MD;  Location: [facility];  Service: General;  Laterality: N/A;  . RADIOACTIVE SEED GUIDED EXCISIONAL BREAST BIOPSY Left 08/17/2017   Procedure: LEFT RADIOACTIVE SEED GUIDED EXCISIONAL BREAST BIOPSY ERAS PATHWAY;  Surgeon: Rolm Bookbinder, MD;  Location: [facility];  Service: General;  Laterality: Left;  . RADIOACTIVE SEED GUIDED PARTIAL MASTECTOMY WITH AXILLARY SENTINEL LYMPH NODE BIOPSY Left 03/06/2015   Procedure: RADIOACTIVE SEED GUIDED PARTIAL MASTECTOMY WITH AXILLARY SENTINEL LYMPH NODE BIOPSY;  Surgeon: Rolm Bookbinder, MD;  Location: [facility];  Service: General;  Laterality: Left;  . REMOVAL OF BILATERAL TISSUE EXPANDERS WITH PLACEMENT OF BILATERAL BREAST IMPLANTS Bilateral 01/06/2018   Procedure: REMOVAL OF BILATERAL TISSUE EXPANDERS WITH PLACEMENT OF BILATERAL BREAST IMPLANTS;  Surgeon: Irene Limbo, MD;  Location: [facility];  Service: Plastics;  Laterality: Bilateral;  . TONSILLECTOMY  1996    FAMILY HISTORY: Family History  Problem Relation Age of Onset  . Skin cancer Father   . Hyperlipidemia Father   .  Prostate cancer Father   . Prostate cancer Maternal Uncle 6       currently 38  . Multiple myeloma Paternal Grandmother 35       Deceased 68  . Cancer Paternal Grandmother        bone  . Prostate cancer Paternal Uncle   . Lung disease Maternal Grandmother   . AAA (abdominal aortic aneurysm) Maternal Grandmother   . Stroke Maternal Grandfather   . Colon cancer Neg Hx   . Esophageal cancer Neg Hx   . Rectal cancer Neg Hx   . Stomach cancer Neg  Hx    The patient's parents are both living. The patient has one brother, no sisters. One grandmother was diagnosed with multiple myeloma at age 74. There is no history of breast or ovarian cancer in the family.    GYNECOLOGIC HISTORY:  Patient's last menstrual period was 02/22/2015.  Menarche age 25, first live birth age 58. The patient is GX P2. She was still having regular periods at the start of chemotherapy. She was on birth control pills on and off for the last 15 years, stopping in October of 2015.    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 Her husband Rolla Plate works as a Immunologist at [employer]. Their children are aged 30, and 28 as of August 2018   ADVANCED DIRECTIVES: In place   HEALTH MAINTENANCE: Social History   Tobacco Use  . Smoking status: Never Smoker  . Smokeless tobacco: Never Used  Substance Use Topics  . Alcohol use: Yes    Comment: 3 x/week  . Drug use: No    Colonoscopy:  PAP:  November 2014  Bone density:  Lipid panel:  Allergies  Allergen Reactions  . Chlorhexidine Itching and Rash  . Morphine And Related Nausea And Vomiting  . Other Rash    Dermabond  . Tegaderm Ag Mesh [Silver] Rash    Reports that the larger tegaderm causes rashes    Current Outpatient Medications  Medication Sig Dispense Refill  . cholecalciferol (VITAMIN D3) 25 MCG (1000 UT) tablet Take 1 tablet (1,000 Units total) by mouth daily.    Marland Kitchen estradiol (ESTRACE VAGINAL) 0.1 MG/GM vaginal cream Place 1 Applicatorful vaginally at bedtime. 42.5 g 12  . gabapentin (NEURONTIN) 300 MG capsule TAKE 1 CAPSULE BY MOUTH DAILY AT BEDTIME 90 capsule 4  . Multiple Vitamin (MULTI-VITAMINS) TABS Take by mouth.    . tamoxifen (NOLVADEX) 20 MG tablet Take 1 tablet (20 mg total) by mouth daily. 90 tablet 4   No current facility-administered medications for this visit.     OBJECTIVE: Young appearing white woman in no acute distress  Vitals:   08/13/19 1111   BP: 120/86  Pulse: (!) 58  Resp: 20  Temp: 98.2 F (36.8 C)  SpO2: 100%   Wt Readings from Last 3 Encounters:  08/13/19 121 lb 8 oz (55.1 kg)  08/06/19 118 lb 6 oz (53.7 kg)  02/01/19 117 lb 9.6 oz (53.3 kg)   Body mass index is 20.22 kg/m.    ECOG FS:1 - Symptomatic but completely ambulatory  Ocular: Sclerae unicteric, pupils round and equal Ear-nose-throat: Wearing a mask Lymphatic: No cervical or supraclavicular adenopathy Lungs no rales or rhonchi Heart regular rate and rhythm Abd soft, nontender, positive bowel sounds MSK no focal spinal tenderness, no joint edema Neuro: non-focal, well-oriented, appropriate affect Breasts: Status post bilateral mastectomies with bilateral implant reconstruction.  There is no evidence  of disease recurrence.  Both axillae are benign.   LAB RESULTS:  CMP     Component Value Date/Time   NA 142 08/13/2019 1059   NA 141 08/02/2017 1536   K 4.1 08/13/2019 1059   K 4.0 08/02/2017 1536   CL 106 08/13/2019 1059   CO2 28 08/13/2019 1059   CO2 32 (H) 08/02/2017 1536   GLUCOSE 95 08/13/2019 1059   GLUCOSE 93 08/02/2017 1536   BUN 9 08/13/2019 1059   BUN 11.4 08/02/2017 1536   CREATININE 0.76 08/13/2019 1059   CREATININE 0.8 08/02/2017 1536   CALCIUM 8.9 08/13/2019 1059   CALCIUM 9.8 08/02/2017 1536   PROT 6.4 (L) 08/13/2019 1059   PROT 6.7 08/02/2017 1536   ALBUMIN 4.0 08/13/2019 1059   ALBUMIN 3.9 08/02/2017 1536   AST 21 08/13/2019 1059   AST 22 08/02/2017 1536   ALT 16 08/13/2019 1059   ALT 12 08/02/2017 1536   ALKPHOS 40 08/13/2019 1059   ALKPHOS 51 08/02/2017 1536   BILITOT 0.4 08/13/2019 1059   BILITOT 0.31 08/02/2017 1536   GFRNONAA >60 08/13/2019 1059   GFRAA >60 08/13/2019 1059    I No results found for: SPEP  Lab Results  Component Value Date   WBC 2.9 (L) 08/13/2019   NEUTROABS 1.4 (L) 08/13/2019   HGB 12.3 08/13/2019   HCT 38.4 08/13/2019   MCV 91.9 08/13/2019   PLT 192 08/13/2019      Chemistry       Component Value Date/Time   NA 142 08/13/2019 1059   NA 141 08/02/2017 1536   K 4.1 08/13/2019 1059   K 4.0 08/02/2017 1536   CL 106 08/13/2019 1059   CO2 28 08/13/2019 1059   CO2 32 (H) 08/02/2017 1536   BUN 9 08/13/2019 1059   BUN 11.4 08/02/2017 1536   CREATININE 0.76 08/13/2019 1059   CREATININE 0.8 08/02/2017 1536      Component Value Date/Time   CALCIUM 8.9 08/13/2019 1059   CALCIUM 9.8 08/02/2017 1536   ALKPHOS 40 08/13/2019 1059   ALKPHOS 51 08/02/2017 1536   AST 21 08/13/2019 1059   AST 22 08/02/2017 1536   ALT 16 08/13/2019 1059   ALT 12 08/02/2017 1536   BILITOT 0.4 08/13/2019 1059   BILITOT 0.31 08/02/2017 1536       No results found for: LABCA2  No components found for: LABCA125  No results for input(s): INR in the last 168 hours.  Urinalysis No results found for: COLORURINE   STUDIES: No results found.    ASSESSMENT: 43 y.o. BRCA negative [place] woman s/p Left breast upper outer quadrant biopsy 09/20/2014, for a clinical T1c N0, stage IA invasive ductal carcinoma, grade 2, estrogen and progesterone receptor positive, HER-2 amplified with a signal is ratio of 5.07, and an MIB-1 of 16%  (1) neoadjuvant treatment consisting of carboplatin, docetaxel, trastuzumab and pertuzumab given every 21 days x6,  completed 02/10/2015  (a) breast MRI 02/11/2015 showed a complete radiologic response  (2) left lumpectomy and sentinel lymph node sampling 03/06/2015 showed a residual ypT1c ypN1a, stage IIA invasive ductal carcinoma, grade 1, with repeat prognostic panel still triple positive, and negative margins  (3) trastuzumab continued to total one year (last dose 10/01/2015)  (a) final echo 08/07/2015 finds an ejection fraction of 55-60%.  (4) adjuvant radiation 04/16/2015-06/02/2015 Left breast 50.4 gray in 28 fractions, axillary/supraclavicular region 45 gray in 25 fractions, lumpectomy boost 10 gray in 5 fractions  (5) started tamoxifen 06/26/2015  (  a)  goserelin started 04/03/2015, last dose 10/06/2015  (b) laparoscopic bilateral salpingo-oophorectomy 11/12/2015 with benign pathology  (6)  the BreastNext geneprofile  ([place] [employer]) obtained November 2015 did not reveal a mutation in ATM, BARD1, BRCA1, BRCA BRIP1, CDH1, CHEK2, MRE11A, MUTYH, NBN, NF1, PALB2, PTEN, RAD50, RAD51C, RAD51D, or TP53  (7) left lumpectomy 08/17/2017 showed ductal carcinoma in situ, grade 2, estrogen and progesterone receptor negative, with close though negative margins  (8) bilateral mastectomies 09/26/2017 showed  (a) on the right, no malignancy  (b) on the left, ductal carcinoma in situ, grade 2, with a close but negative anterior margin (skin)  (9) iron deficiency anemia: Resolved    PLAN: Teasha is now just about 2 years out from definitive surgery for her breast cancer with no evidence of disease recurrence.  This is very favorable.  She is tolerating tamoxifen well and the plan will be to continue that a minimum of 5 years.  She has an excellent exercise program.  She does have some vaginal dryness issues and did not get much relief from Estring.  We are going to try Estrace cream and see if that is helpful.  I gave her information on the current Pfizer vaccine trial  She knows to call for any issue that may develop before her next visit which will be in 1 year. Magrinat, Virgie Dad, MD  08/13/19 11:47 AM Medical Oncology and Hematology [facility] [address] [place], [place] [address] Tel. [phone]    Fax. [phone]  I, Jacqualyn Posey am acting as a Education administrator for Chauncey Cruel, MD.   I, Lurline Del MD, have reviewed the above documentation for accuracy and completeness, and I agree with the above.

## 2019-08-13 ENCOUNTER — Inpatient Hospital Stay (HOSPITAL_BASED_OUTPATIENT_CLINIC_OR_DEPARTMENT_OTHER): Payer: 59 | Admitting: Oncology

## 2019-08-13 ENCOUNTER — Inpatient Hospital Stay: Payer: 59 | Attending: Oncology

## 2019-08-13 ENCOUNTER — Other Ambulatory Visit: Payer: Self-pay

## 2019-08-13 VITALS — BP 120/86 | HR 58 | Temp 98.2°F | Resp 20 | Ht 65.0 in | Wt 121.5 lb

## 2019-08-13 DIAGNOSIS — D708 Other neutropenia: Secondary | ICD-10-CM

## 2019-08-13 DIAGNOSIS — C50412 Malignant neoplasm of upper-outer quadrant of left female breast: Secondary | ICD-10-CM

## 2019-08-13 DIAGNOSIS — Z170 Estrogen receptor positive status [ER+]: Secondary | ICD-10-CM | POA: Diagnosis not present

## 2019-08-13 DIAGNOSIS — Z9013 Acquired absence of bilateral breasts and nipples: Secondary | ICD-10-CM | POA: Insufficient documentation

## 2019-08-13 DIAGNOSIS — M8580 Other specified disorders of bone density and structure, unspecified site: Secondary | ICD-10-CM | POA: Insufficient documentation

## 2019-08-13 DIAGNOSIS — D72819 Decreased white blood cell count, unspecified: Secondary | ICD-10-CM | POA: Insufficient documentation

## 2019-08-13 DIAGNOSIS — D0512 Intraductal carcinoma in situ of left breast: Secondary | ICD-10-CM

## 2019-08-13 DIAGNOSIS — Z79810 Long term (current) use of selective estrogen receptor modulators (SERMs): Secondary | ICD-10-CM | POA: Insufficient documentation

## 2019-08-13 LAB — COMPREHENSIVE METABOLIC PANEL
ALT: 16 U/L (ref 0–44)
AST: 21 U/L (ref 15–41)
Albumin: 4 g/dL (ref 3.5–5.0)
Alkaline Phosphatase: 40 U/L (ref 38–126)
Anion gap: 8 (ref 5–15)
BUN: 9 mg/dL (ref 6–20)
CO2: 28 mmol/L (ref 22–32)
Calcium: 8.9 mg/dL (ref 8.9–10.3)
Chloride: 106 mmol/L (ref 98–111)
Creatinine, Ser: 0.76 mg/dL (ref 0.44–1.00)
GFR calc Af Amer: >60 mL/min (ref >60)
GFR calc non Af Amer: >60 mL/min (ref >60)
Glucose, Bld: 95 mg/dL (ref 70–99)
Potassium: 4.1 mmol/L (ref 3.5–5.1)
Sodium: 142 mmol/L (ref 135–145)
Total Bilirubin: 0.4 mg/dL (ref 0.3–1.2)
Total Protein: 6.4 g/dL — ABNORMAL LOW (ref 6.5–8.1)

## 2019-08-13 LAB — CBC WITH DIFFERENTIAL/PLATELET
Abs Immature Granulocytes: 0 10*3/uL (ref 0.00–0.07)
Basophils Absolute: 0 10*3/uL (ref 0.0–0.1)
Basophils Relative: 1 %
Eosinophils Absolute: 0.1 10*3/uL (ref 0.0–0.5)
Eosinophils Relative: 2 %
HCT: 38.4 % (ref 36.0–46.0)
Hemoglobin: 12.3 g/dL (ref 12.0–15.0)
Immature Granulocytes: 0 %
Lymphocytes Relative: 43 %
Lymphs Abs: 1.2 10*3/uL (ref 0.7–4.0)
MCH: 29.4 pg (ref 26.0–34.0)
MCHC: 32 g/dL (ref 30.0–36.0)
MCV: 91.9 fL (ref 80.0–100.0)
Monocytes Absolute: 0.2 10*3/uL (ref 0.1–1.0)
Monocytes Relative: 6 %
Neutro Abs: 1.4 10*3/uL — ABNORMAL LOW (ref 1.7–7.7)
Neutrophils Relative %: 48 %
Platelets: 192 10*3/uL (ref 150–400)
RBC: 4.18 MIL/uL (ref 3.87–5.11)
RDW: 13 % (ref 11.5–15.5)
WBC: 2.9 10*3/uL — ABNORMAL LOW (ref 4.0–10.5)
nRBC: 0 % (ref 0.0–0.2)

## 2019-08-13 MED ORDER — GABAPENTIN 300 MG PO CAPS: ORAL_CAPSULE | ORAL | 4 refills | Status: DC

## 2019-08-13 MED ORDER — TAMOXIFEN CITRATE 20 MG PO TABS: 20.0000 mg | ORAL_TABLET | Freq: Every day | ORAL | 4 refills | Status: DC

## 2019-08-13 MED ORDER — ESTRADIOL 0.1 MG/GM VA CREA: 1.0000 | TOPICAL_CREAM | Freq: Every day | VAGINAL | 12 refills | Status: DC

## 2019-08-14 ENCOUNTER — Telehealth: Payer: Self-pay | Admitting: Oncology

## 2019-08-14 ENCOUNTER — Telehealth: Payer: Self-pay | Admitting: *Deleted

## 2019-08-14 MED ORDER — ESTRADIOL 10 MCG VA TABS: 10.0000 ug | ORAL_TABLET | VAGINAL | 4 refills | Status: DC

## 2019-08-14 NOTE — Telephone Encounter (Signed)
I could not reach patient regarding schedule  °

## 2019-08-14 NOTE — Telephone Encounter (Signed)
This RN spoke with pt' pharmacy per their call wanting to clarify estradiol dosing .  This RN revised prescription per use for vaginal atrophy while on tamoxifen to estradiol vaginal 10 mcg suppositories for use three times a week x 2 week then twice a week thereafter.  Vaginal estrace cream discontinued.

## 2019-08-24 ENCOUNTER — Other Ambulatory Visit: Payer: Self-pay

## 2019-08-24 ENCOUNTER — Ambulatory Visit
Admission: RE | Admit: 2019-08-24 | Discharge: 2019-08-24 | Disposition: A | Discharge status: Home / Self Care | Payer: 59 | Source: Ambulatory Visit | Attending: Adult Health | Admitting: Adult Health

## 2019-08-24 ENCOUNTER — Other Ambulatory Visit: Payer: Self-pay | Admitting: Adult Health

## 2019-08-24 DIAGNOSIS — N6489 Other specified disorders of breast: Secondary | ICD-10-CM | POA: Diagnosis not present

## 2019-08-24 DIAGNOSIS — D0512 Intraductal carcinoma in situ of left breast: Secondary | ICD-10-CM

## 2019-08-24 DIAGNOSIS — Z170 Estrogen receptor positive status [ER+]: Secondary | ICD-10-CM

## 2019-08-24 DIAGNOSIS — C50412 Malignant neoplasm of upper-outer quadrant of left female breast: Secondary | ICD-10-CM

## 2019-08-24 DIAGNOSIS — N6310 Unspecified lump in the right breast, unspecified quadrant: Secondary | ICD-10-CM

## 2019-09-13 DIAGNOSIS — Z9013 Acquired absence of bilateral breasts and nipples: Secondary | ICD-10-CM | POA: Diagnosis not present

## 2019-09-13 DIAGNOSIS — Z923 Personal history of irradiation: Secondary | ICD-10-CM | POA: Diagnosis not present

## 2019-09-13 DIAGNOSIS — Z853 Personal history of malignant neoplasm of breast: Secondary | ICD-10-CM | POA: Diagnosis not present

## 2019-09-14 MED FILL — ESTRADIOL 10 MCG TABS: 10 | 28 days supply | Qty: 8 | Fill #0

## 2019-09-14 MED FILL — CELECOXIB 100 MG CAP: 100 | 30 days supply | Qty: 30 | Fill #0

## 2019-09-20 ENCOUNTER — Other Ambulatory Visit: Payer: Self-pay | Admitting: Adult Health

## 2019-09-20 DIAGNOSIS — C50412 Malignant neoplasm of upper-outer quadrant of left female breast: Secondary | ICD-10-CM

## 2019-10-02 DIAGNOSIS — Z23 Encounter for immunization: Secondary | ICD-10-CM | POA: Diagnosis not present

## 2019-10-23 MED FILL — ESTRADIOL 10 MCG TABS: 10 | 28 days supply | Qty: 8 | Fill #1

## 2019-11-01 MED FILL — CELECOXIB 100 MG CAPS: 100 | 30 days supply | Qty: 30 | Fill #1

## 2019-11-19 DIAGNOSIS — Z01419 Encounter for gynecological examination (general) (routine) without abnormal findings: Secondary | ICD-10-CM | POA: Diagnosis not present

## 2019-11-19 DIAGNOSIS — Z681 Body mass index (BMI) 19 or less, adult: Secondary | ICD-10-CM | POA: Diagnosis not present

## 2019-11-19 DIAGNOSIS — N952 Postmenopausal atrophic vaginitis: Secondary | ICD-10-CM | POA: Diagnosis not present

## 2019-11-19 DIAGNOSIS — Z1389 Encounter for screening for other disorder: Secondary | ICD-10-CM | POA: Diagnosis not present

## 2019-11-19 MED FILL — ESTRADIOL 0.1 MG/GM CRM: 0.1 | 84 days supply | Qty: 43 | Fill #0

## 2019-11-22 DIAGNOSIS — Z79810 Long term (current) use of selective estrogen receptor modulators (SERMs): Secondary | ICD-10-CM | POA: Diagnosis not present

## 2019-11-22 DIAGNOSIS — N920 Excessive and frequent menstruation with regular cycle: Secondary | ICD-10-CM | POA: Diagnosis not present

## 2019-12-17 MED FILL — CELECOXIB 100 MG CAPS: 100 | 30 days supply | Qty: 30 | Fill #2

## 2020-01-20 MED FILL — ESTRADIOL 0.1 MG/GM CRM: 0.1 | 77 days supply | Qty: 43 | Fill #1

## 2020-02-04 MED FILL — CELECOXIB 100 MG CAPS: 100 | 30 days supply | Qty: 30 | Fill #3

## 2020-02-05 ENCOUNTER — Inpatient Hospital Stay: Payer: 59 | Admitting: Oncology

## 2020-02-05 ENCOUNTER — Inpatient Hospital Stay: Payer: 59

## 2020-02-05 DIAGNOSIS — C50912 Malignant neoplasm of unspecified site of left female breast: Secondary | ICD-10-CM | POA: Diagnosis not present

## 2020-02-05 DIAGNOSIS — M47818 Spondylosis without myelopathy or radiculopathy, sacral and sacrococcygeal region: Secondary | ICD-10-CM | POA: Diagnosis not present

## 2020-02-05 DIAGNOSIS — L601 Onycholysis: Secondary | ICD-10-CM | POA: Diagnosis not present

## 2020-02-05 DIAGNOSIS — Z681 Body mass index (BMI) 19 or less, adult: Secondary | ICD-10-CM | POA: Diagnosis not present

## 2020-02-05 DIAGNOSIS — K317 Polyp of stomach and duodenum: Secondary | ICD-10-CM | POA: Diagnosis not present

## 2020-02-05 DIAGNOSIS — Z8261 Family history of arthritis: Secondary | ICD-10-CM | POA: Diagnosis not present

## 2020-02-05 DIAGNOSIS — Z170 Estrogen receptor positive status [ER+]: Secondary | ICD-10-CM | POA: Diagnosis not present

## 2020-02-05 DIAGNOSIS — D508 Other iron deficiency anemias: Secondary | ICD-10-CM | POA: Diagnosis not present

## 2020-02-05 DIAGNOSIS — M2550 Pain in unspecified joint: Secondary | ICD-10-CM | POA: Diagnosis not present

## 2020-03-06 ENCOUNTER — Other Ambulatory Visit (HOSPITAL_COMMUNITY): Payer: Self-pay | Admitting: Internal Medicine

## 2020-03-06 DIAGNOSIS — Z411 Encounter for cosmetic surgery: Secondary | ICD-10-CM | POA: Diagnosis not present

## 2020-03-06 DIAGNOSIS — L814 Other melanin hyperpigmentation: Secondary | ICD-10-CM | POA: Diagnosis not present

## 2020-03-06 DIAGNOSIS — D225 Melanocytic nevi of trunk: Secondary | ICD-10-CM | POA: Diagnosis not present

## 2020-03-06 DIAGNOSIS — Z23 Encounter for immunization: Secondary | ICD-10-CM | POA: Diagnosis not present

## 2020-03-06 DIAGNOSIS — L821 Other seborrheic keratosis: Secondary | ICD-10-CM | POA: Diagnosis not present

## 2020-03-06 DIAGNOSIS — L578 Other skin changes due to chronic exposure to nonionizing radiation: Secondary | ICD-10-CM | POA: Diagnosis not present

## 2020-03-06 MED FILL — CELECOXIB 100 MG CAPS: 100 | 90 days supply | Qty: 90 | Fill #0

## 2020-04-22 ENCOUNTER — Ambulatory Visit (INDEPENDENT_AMBULATORY_CARE_PROVIDER_SITE_OTHER): Payer: 59 | Admitting: Family Medicine

## 2020-04-22 ENCOUNTER — Encounter: Payer: Self-pay | Admitting: Family Medicine

## 2020-04-22 ENCOUNTER — Other Ambulatory Visit: Payer: Self-pay

## 2020-04-22 VITALS — BP 101/72 | HR 61 | Temp 97.9°F | Resp 16 | Ht 66.0 in | Wt 116.1 lb

## 2020-04-22 DIAGNOSIS — M6289 Other specified disorders of muscle: Secondary | ICD-10-CM

## 2020-04-22 DIAGNOSIS — R5383 Other fatigue: Secondary | ICD-10-CM | POA: Diagnosis not present

## 2020-04-22 DIAGNOSIS — L299 Pruritus, unspecified: Secondary | ICD-10-CM

## 2020-04-22 LAB — CBC WITH DIFFERENTIAL/PLATELET
HCT: 36.2 % (ref 36.0–46.0)
Hemoglobin: 11.9 g/dL — ABNORMAL LOW (ref 12.0–15.0)
MCHC: 32.9 g/dL (ref 30.0–36.0)
MCV: 88.8 fl (ref 78.0–100.0)
Platelets: 195 10*3/uL (ref 150.0–400.0)
RBC: 4.08 Mil/uL (ref 3.87–5.11)
RDW: 14.2 % (ref 11.5–15.5)
WBC: 2.9 10*3/uL — ABNORMAL LOW (ref 4.0–10.5)

## 2020-04-22 LAB — HEPATIC FUNCTION PANEL
ALT: 17 U/L (ref 0–35)
AST: 26 U/L (ref 0–37)
Albumin: 4.3 g/dL (ref 3.5–5.2)
Alkaline Phosphatase: 37 U/L — ABNORMAL LOW (ref 39–117)
Bilirubin, Direct: 0.1 mg/dL (ref 0.0–0.3)
Total Bilirubin: 0.6 mg/dL (ref 0.2–1.2)
Total Protein: 6.5 g/dL (ref 6.0–8.3)

## 2020-04-22 LAB — TSH: TSH: 2.09 u[IU]/mL (ref 0.35–4.50)

## 2020-04-22 LAB — VITAMIN D 25 HYDROXY (VIT D DEFICIENCY, FRACTURES): VITD: 41.35 ng/mL (ref 30.00–100.00)

## 2020-04-22 LAB — BASIC METABOLIC PANEL
BUN: 10 mg/dL (ref 6–23)
CO2: 31 mEq/L (ref 19–32)
Calcium: 9.3 mg/dL (ref 8.4–10.5)
Chloride: 103 mEq/L (ref 96–112)
Creatinine, Ser: 0.72 mg/dL (ref 0.40–1.20)
GFR: 87.87 mL/min (ref >60.00)
Glucose, Bld: 97 mg/dL (ref 70–99)
Potassium: 4.2 mEq/L (ref 3.5–5.1)
Sodium: 140 mEq/L (ref 135–145)

## 2020-04-22 LAB — SEDIMENTATION RATE: Sed Rate: 1 mm/hr (ref 0–20)

## 2020-04-22 LAB — B12 AND FOLATE PANEL
Folate: 20.1 ng/mL (ref >5.9)
Vitamin B-12: 424 pg/mL (ref 211–911)

## 2020-04-22 LAB — CK: Total CK: 177 U/L (ref 7–177)

## 2020-04-22 NOTE — Patient Instructions (Signed)
Follow up as needed or as scheduled We'll notify you of your lab results and make any changes if needed Move the Gabapentin to dinner and see if it improves your evening symptoms Consider taking Gabapentin 100mg  during the day to see if it improves START daily Claritin or Allegra to help w/ itching Call with any questions or concerns Hang in there!!

## 2020-04-22 NOTE — Progress Notes (Signed)
Subjective:    Patient ID: Nancy Austin, female    DOB: 02-13-1976, 44 y.o.   MRN: [id number]  HPI Leg itching- upper and lower legs bilaterally, 'pretty much all the time'.  Worse at night and first thing in the AM.  No rash present.  Describes as a 'burning itch'.  sxs started ~2-3 weeks ago.  No change in medications, detergents, etc.  Some improvement w/ lotion.  On Gabapentin 362m nightly.  Some improvement w/ Benadryl.  Doesn't take Zyrtec b/c it causes drowsiness.  Didn't notice a difference in sxs w/ Allegra.  Muscle fatigue- pt reports difficulty w/ walking upstairs.  She will develop a 'burning' in her quads.  No UE sxs.  Muscle fatigue is accompanied by generalized fatigue.  Hx of anemia and she felt similar at that time.   Review of Systems For ROS see HPI   This visit occurred during the SARS-CoV-2 public health emergency.  Safety protocols were in place, including screening questions prior to the visit, additional usage of staff PPE, and extensive cleaning of exam room while observing appropriate contact time as indicated for disinfecting solutions.       Objective:   Physical Exam Vitals reviewed.  Constitutional:      General: She is not in acute distress.    Appearance: Normal appearance. She is not ill-appearing.  HENT:     Head: Normocephalic and atraumatic.  Neck:     Comments: No thyromegaly or nodules Cardiovascular:     Rate and Rhythm: Normal rate and regular rhythm.     Pulses: Normal pulses.     Heart sounds: Normal heart sounds. No murmur.  Pulmonary:     Effort: Pulmonary effort is normal.     Breath sounds: Normal breath sounds.  Abdominal:     General: Abdomen is flat. There is no distension.     Palpations: Abdomen is soft.     Tenderness: There is no abdominal tenderness. There is no guarding.  Musculoskeletal:        General: No swelling, tenderness or deformity.     Cervical back: Normal range of motion and neck supple.  Skin:    General:  Skin is warm and dry.     Findings: No erythema or rash.  Neurological:     General: No focal deficit present.     Mental Status: She is alert and oriented to person, place, and time.     Cranial Nerves: No cranial nerve deficit.     Motor: No weakness.     Gait: Gait normal.  Psychiatric:        Mood and Affect: Mood normal.        Behavior: Behavior normal.        Thought Content: Thought content normal.           Assessment & Plan:  Itching- no obvious rash or change in environment to suggest cause.  Given that she describes the itching as a 'burning itch' I wonder if this could be neuropathic.  Encouraged her to experiment by moving Gabapentin up to dinner to see if this helps her evening sxs.  She has 1050mtabs remaining, encouraged her to try one during the day to see if it helps.  Also encouraged daily antihistamine.  Will follow  Muscle fatigue- new.  Notable when climbing stairs- particularly in quads.  Will check ESR, CK, LFTs.  Fatigue- pt also having generalized fatigue.  Has hx of iron deficiency anemia.  Will  check labs to r/o metabolic cause and address any abnormalities if present.  Pt expressed understanding and is in agreement w/ plan.

## 2020-04-23 LAB — IRON,TIBC AND FERRITIN PANEL
%SAT: 15 % (calc) — ABNORMAL LOW (ref 16–45)
Ferritin: 6 ng/mL — ABNORMAL LOW (ref 16–232)
Iron: 56 ug/dL (ref 40–190)
TIBC: 373 mcg/dL (calc) (ref 250–450)

## 2020-05-17 ENCOUNTER — Telehealth: Payer: 59 | Admitting: Physician Assistant

## 2020-05-17 DIAGNOSIS — H00014 Hordeolum externum left upper eyelid: Secondary | ICD-10-CM | POA: Diagnosis not present

## 2020-05-17 NOTE — Progress Notes (Signed)
We are sorry that you are not feeling well. Here is how we plan to help!  Based on what you have shared with me it looks like you have a stye.  A stye is an inflammation of the eyelid.  It is often a red, painful lump near the edge of the eyelid that may look like a boil or a pimple.  A stye develops when an infection occurs at the base of an eyelash.   We have made appropriate suggestions for you based upon your presentation: Simple styes can be treated without medical intervention.  Most styes either resolve spontaneously or resolve with simple home treatment by applying warm compresses or heated washcloth to the stye for about 10-15 minutes three to four times a day. This causes the stye to drain and resolve.  HOME CARE:   Wash your hands often!  Let the stye open on its own. Don't squeeze or open it.  Don't rub your eyes. This can irritate your eyes and let in bacteria.  If you need to touch your eyes, wash your hands first.  Don't wear eye makeup or contact lenses until the area has healed.  GET HELP RIGHT AWAY IF:   Your symptoms do not improve.  You develop blurred or loss of vision.  Your symptoms worsen (increased discharge, pain or redness).  Thank you for choosing an e-visit.  Your e-visit answers were reviewed by a board certified advanced clinical practitioner to complete your personal care plan.  Depending upon the condition, your plan could have included both over the counter or prescription medications.  Please review your pharmacy choice.  Make sure the pharmacy is open so you can pick up prescription now.  If there is a problem, you may contact your provider through [employer] and have the prescription routed to another pharmacy.    Your safety is important to [place].  If you have drug allergies check your prescription carefully.  For the next 24 hours you can use MyChart to ask questions about today's visit, request a non-urgent call back, or ask for a work or  school excuse.  You will get an email in the next two days asking about your experience.  I hope you that your e-visit has been valuable and will speed your recovery.  Greater than 5 minutes, yet less than 10 minutes of time have been spent researching, coordinating, and implementing care for this patient today

## 2020-06-23 MED FILL — CELECOXIB 100 MG CAPS: 100 | 90 days supply | Qty: 90 | Fill #1

## 2020-07-31 ENCOUNTER — Other Ambulatory Visit: Payer: Self-pay | Admitting: Adult Health

## 2020-07-31 DIAGNOSIS — C50412 Malignant neoplasm of upper-outer quadrant of left female breast: Secondary | ICD-10-CM

## 2020-08-04 DIAGNOSIS — L601 Onycholysis: Secondary | ICD-10-CM | POA: Diagnosis not present

## 2020-08-04 DIAGNOSIS — M2550 Pain in unspecified joint: Secondary | ICD-10-CM | POA: Diagnosis not present

## 2020-08-04 DIAGNOSIS — Z681 Body mass index (BMI) 19 or less, adult: Secondary | ICD-10-CM | POA: Diagnosis not present

## 2020-08-04 DIAGNOSIS — C50912 Malignant neoplasm of unspecified site of left female breast: Secondary | ICD-10-CM | POA: Diagnosis not present

## 2020-08-04 DIAGNOSIS — M47818 Spondylosis without myelopathy or radiculopathy, sacral and sacrococcygeal region: Secondary | ICD-10-CM | POA: Diagnosis not present

## 2020-08-04 DIAGNOSIS — Z170 Estrogen receptor positive status [ER+]: Secondary | ICD-10-CM | POA: Diagnosis not present

## 2020-08-04 DIAGNOSIS — K317 Polyp of stomach and duodenum: Secondary | ICD-10-CM | POA: Diagnosis not present

## 2020-08-04 DIAGNOSIS — Z8261 Family history of arthritis: Secondary | ICD-10-CM | POA: Diagnosis not present

## 2020-08-04 DIAGNOSIS — D508 Other iron deficiency anemias: Secondary | ICD-10-CM | POA: Diagnosis not present

## 2020-08-07 ENCOUNTER — Encounter: Payer: Self-pay | Admitting: Family Medicine

## 2020-08-07 ENCOUNTER — Ambulatory Visit (INDEPENDENT_AMBULATORY_CARE_PROVIDER_SITE_OTHER): Payer: 59 | Admitting: Family Medicine

## 2020-08-07 ENCOUNTER — Other Ambulatory Visit: Payer: Self-pay

## 2020-08-07 VITALS — BP 101/62 | HR 59 | Temp 97.5°F | Resp 16 | Ht 66.0 in | Wt 118.1 lb

## 2020-08-07 DIAGNOSIS — Z0000 Encounter for general adult medical examination without abnormal findings: Secondary | ICD-10-CM | POA: Diagnosis not present

## 2020-08-07 DIAGNOSIS — Z23 Encounter for immunization: Secondary | ICD-10-CM

## 2020-08-07 NOTE — Progress Notes (Signed)
   Subjective:    Patient ID: Nancy Austin, female    DOB: [birth year], 44 y.o.   MRN: [id number]  HPI CPE- UTD on pap, colonoscopy, Tdap, COVID.  Due for flu.  Pt reports feeling well  Reviewed past medical, surgical, family and social histories.   Health Maintenance  Topic Date Due  . INFLUENZA VACCINE  07/13/2020  . Hepatitis C Screening  08/07/2021 (Originally Apr 05, 1976)  . HIV Screening  08/07/2021 (Originally 01/01/1991)  . PAP SMEAR-Modifier  11/14/2021  . TETANUS/TDAP  03/29/2027  . COVID-19 Vaccine  Completed     Patient Care Team    Relationship Specialty Notifications Start End  Midge Minium, MD PCP - General Family Medicine  03/28/17   Rolm Bookbinder, MD Consulting Physician General Surgery  09/24/14   Gery Pray, MD Consulting Physician Radiation Oncology  09/24/14   Magrinat, Virgie Dad, MD Consulting Physician Oncology  10/16/14   Paula Compton, MD Consulting Physician Obstetrics and Gynecology  03/28/17   Hennie Duos, MD Consulting Physician Rheumatology  08/09/18   Yetta Flock, MD Consulting Physician Gastroenterology  02/01/19       Review of Systems Patient reports no vision/ hearing changes, adenopathy,fever, weight change,  persistant/recurrent hoarseness , swallowing issues, chest pain, palpitations, edema, persistant/recurrent cough, hemoptysis, dyspnea (rest/exertional/paroxysmal nocturnal), gastrointestinal bleeding (melena, rectal bleeding), abdominal pain, significant heartburn, bowel changes, GU symptoms (dysuria, hematuria, incontinence), Gyn symptoms (abnormal  bleeding, pain),  syncope, focal weakness, memory loss, numbness & tingling, skin/hair/nail changes, abnormal bruising or bleeding, anxiety, or depression.   This visit occurred during the SARS-CoV-2 public health emergency.  Safety protocols were in place, including screening questions prior to the visit, additional usage of staff PPE, and extensive cleaning of exam room  while observing appropriate contact time as indicated for disinfecting solutions.       Objective:   Physical Exam General Appearance:    Alert, cooperative, no distress, appears stated age  Head:    Normocephalic, without obvious abnormality, atraumatic  Eyes:    PERRL, conjunctiva/corneas clear, EOM's intact, fundi    benign, both eyes  Ears:    Normal TM's and external ear canals, both ears  Nose:   Deferred due to COVID  Throat:   Neck:   Supple, symmetrical, trachea midline, no adenopathy;    Thyroid: no enlargement/tenderness/nodules  Back:     Symmetric, no curvature, ROM normal, no CVA tenderness  Lungs:     Clear to auscultation bilaterally, respirations unlabored  Chest Wall:    No tenderness or deformity   Heart:    Regular rate and rhythm, S1 and S2 normal, no murmur, rub   or gallop  Breast Exam:    Deferred to GYN  Abdomen:     Soft, non-tender, bowel sounds active all four quadrants,    no masses, no organomegaly  Genitalia:    Deferred to GYN  Rectal:    Extremities:   Extremities normal, atraumatic, no cyanosis or edema  Pulses:   2+ and symmetric all extremities  Skin:   Skin color, texture, turgor normal, no rashes or lesions  Lymph nodes:   Cervical, supraclavicular, and axillary nodes normal  Neurologic:   CNII-XII intact, normal strength, sensation and reflexes    throughout          Assessment & Plan:

## 2020-08-07 NOTE — Assessment & Plan Note (Signed)
Pt's PE WNL.  UTD on pap, colonoscopy, Tdap, COVID.  Flu shot given today.  Reviewed recent labs.  No need to repeat.  Anticipatory guidance provided.

## 2020-08-07 NOTE — Patient Instructions (Signed)
Follow up in 1 year or as needed No need for labs today- you look great! Call with any questions or concerns Stay Safe!  Stay Healthy!

## 2020-08-12 ENCOUNTER — Other Ambulatory Visit: Payer: Self-pay

## 2020-08-12 ENCOUNTER — Inpatient Hospital Stay: Payer: 59 | Attending: Oncology | Admitting: Oncology

## 2020-08-12 ENCOUNTER — Inpatient Hospital Stay: Payer: 59

## 2020-08-12 VITALS — BP 124/78 | HR 45 | Resp 18 | Ht 66.0 in | Wt 117.8 lb

## 2020-08-12 DIAGNOSIS — C50412 Malignant neoplasm of upper-outer quadrant of left female breast: Secondary | ICD-10-CM

## 2020-08-12 DIAGNOSIS — Z9221 Personal history of antineoplastic chemotherapy: Secondary | ICD-10-CM | POA: Diagnosis not present

## 2020-08-12 DIAGNOSIS — Z9013 Acquired absence of bilateral breasts and nipples: Secondary | ICD-10-CM | POA: Diagnosis not present

## 2020-08-12 DIAGNOSIS — Z923 Personal history of irradiation: Secondary | ICD-10-CM | POA: Diagnosis not present

## 2020-08-12 DIAGNOSIS — C50912 Malignant neoplasm of unspecified site of left female breast: Secondary | ICD-10-CM | POA: Insufficient documentation

## 2020-08-12 DIAGNOSIS — D0512 Intraductal carcinoma in situ of left breast: Secondary | ICD-10-CM

## 2020-08-12 DIAGNOSIS — Z170 Estrogen receptor positive status [ER+]: Secondary | ICD-10-CM | POA: Insufficient documentation

## 2020-08-12 DIAGNOSIS — Z79810 Long term (current) use of selective estrogen receptor modulators (SERMs): Secondary | ICD-10-CM | POA: Diagnosis not present

## 2020-08-12 DIAGNOSIS — Z79899 Other long term (current) drug therapy: Secondary | ICD-10-CM | POA: Diagnosis not present

## 2020-08-12 LAB — COMPREHENSIVE METABOLIC PANEL
ALT: 14 U/L (ref 0–44)
AST: 23 U/L (ref 15–41)
Albumin: 4.1 g/dL (ref 3.5–5.0)
Alkaline Phosphatase: 47 U/L (ref 38–126)
Anion gap: 7 (ref 5–15)
BUN: 11 mg/dL (ref 6–20)
CO2: 28 mmol/L (ref 22–32)
Calcium: 10.1 mg/dL (ref 8.9–10.3)
Chloride: 104 mmol/L (ref 98–111)
Creatinine, Ser: 0.75 mg/dL (ref 0.44–1.00)
GFR calc Af Amer: >60 mL/min (ref >60)
GFR calc non Af Amer: >60 mL/min (ref >60)
Glucose, Bld: 92 mg/dL (ref 70–99)
Potassium: 4 mmol/L (ref 3.5–5.1)
Sodium: 139 mmol/L (ref 135–145)
Total Bilirubin: 0.4 mg/dL (ref 0.3–1.2)
Total Protein: 7 g/dL (ref 6.5–8.1)

## 2020-08-12 LAB — CBC WITH DIFFERENTIAL/PLATELET
Abs Immature Granulocytes: 0.01 10*3/uL (ref 0.00–0.07)
Basophils Absolute: 0 10*3/uL (ref 0.0–0.1)
Basophils Relative: 1 %
Eosinophils Absolute: 0 10*3/uL (ref 0.0–0.5)
Eosinophils Relative: 1 %
HCT: 41.9 % (ref 36.0–46.0)
Hemoglobin: 13.5 g/dL (ref 12.0–15.0)
Immature Granulocytes: 0 %
Lymphocytes Relative: 44 %
Lymphs Abs: 1.2 10*3/uL (ref 0.7–4.0)
MCH: 28.4 pg (ref 26.0–34.0)
MCHC: 32.2 g/dL (ref 30.0–36.0)
MCV: 88.2 fL (ref 80.0–100.0)
Monocytes Absolute: 0.2 10*3/uL (ref 0.1–1.0)
Monocytes Relative: 7 %
Neutro Abs: 1.2 10*3/uL — ABNORMAL LOW (ref 1.7–7.7)
Neutrophils Relative %: 47 %
Platelets: 184 10*3/uL (ref 150–400)
RBC: 4.75 MIL/uL (ref 3.87–5.11)
RDW: 13 % (ref 11.5–15.5)
WBC: 2.6 10*3/uL — ABNORMAL LOW (ref 4.0–10.5)
nRBC: 0 % (ref 0.0–0.2)

## 2020-08-12 MED ORDER — GABAPENTIN 300 MG PO CAPS: ORAL_CAPSULE | ORAL | 4 refills | Status: DC

## 2020-08-12 MED ORDER — ESTRING 2 MG VA RING: 2.0000 mg | VAGINAL_RING | VAGINAL | 12 refills | Status: DC

## 2020-08-12 MED ORDER — TAMOXIFEN CITRATE 20 MG PO TABS: 20.0000 mg | ORAL_TABLET | Freq: Every day | ORAL | 4 refills | Status: DC

## 2020-08-12 NOTE — Progress Notes (Signed)
[facility]  Telephone:([phone] [phone]) [phone]     ID: Nancy Austin DOB: 05/16/1976  MR#: [id number]  IPJ#:[id number]  Patient Care Team: Midge Minium, MD as PCP - General (Family Medicine) Rolm Bookbinder, MD as Consulting Physician (General Surgery) Gery Pray, MD as Consulting Physician (Radiation Oncology) Budd Freiermuth, Virgie Dad, MD as Consulting Physician (Oncology) Paula Compton, MD as Consulting Physician (Obstetrics and Gynecology) Hennie Duos, MD as Consulting Physician (Rheumatology) Armbruster, Carlota Raspberry, MD as Consulting Physician (Gastroenterology) OTHER MD:   CHIEF COMPLAINT: Recurrent estrogen receptor positive breast cancer (s/p bilateral mastectomies)  CURRENT TREATMENT:  tamoxifen   INTERVAL HISTORY: Nancy Austin today for follow-up of Nancy Austin.   Nancy Austin continues on tamoxifen.  Nancy Austin generally tolerates this well.  Nancy Austin has minimal hot flashes.  The biggest problem Nancy Austin has is actually vaginal dryness, which is not likely to be due to tamoxifen but rather to Nancy being status post bilateral salpingo-oophorectomy.  Since Nancy last visit, Nancy Austin underwent right breast ultrasound on 08/24/2019. This was follow up of a probably-benign right breast mass. Ultrasound showed further decrease in size of the mass.  Nancy Austin is scheduled for another follow up ultrasound on 08/26/2020.   REVIEW OF SYSTEMS: Velena runs regularly, also walks and does weights.  Nancy Austin has been doubly vaccinated as has Nancy husband.  Nancy children are now attending school in person.  Nancy Austin tried the estradiol suppositories and cream with very little benefit.  Nancy Austin is considering the [employer].  A detailed review of systems today was otherwise stable   BREAST CANCER HISTORY: From the original Intake note:  Selyna herself found a lump in Nancy left breast early October 2015 and brought it to Nancy gynecologist attention. On 09/20/2014 Nancy Austin was set up  for bilateral diagnostic mammography and left breast ultrasonography of the breast Center. This was the patient's first ever mammogram. In the area of concern in the left breast there was suspicious pleomorphic calcifications spanning 1.1 cm. There was no discrete mammographic mass in this dense breasts (category C.). On physical exam, there was a palpable firm at 1.5 cm mass at the 3:00 position in the left breast. By ultrasound this was irregular and hypoechoic and measured 1.8 cm. Ultrasound of the left axilla was unremarkable. Aside from multiple cysts in the left breast there were no other findings of concern.  On the same day, 09/20/2014, the patient underwent biopsy of the left breast palpable mass. This showed ([phone]) an invasive ductal carcinoma, grade 2, estrogen receptor 100% positive, progesterone receptor 89% positive, both with strong staining intensity, with an MIB-1 of 16%. Nancy-2 was amplified with a signals ratio of 5.07 and a copy number per cell of 6.85.   On 09/30/2014 the patient underwent bilateral breast MRIs. This showed a 1.7 cm mass in the upper outer quadrant of the left breast. There was no other suspicious finding in either breast and no abnormal appearing adenopathy.  The patient's subsequent history is as detailed below   PAST MEDICAL HISTORY: Past Medical History:  Diagnosis Date  . Allergy   . Anemia   . Benign skin lesion of forehead 10/2015  . Breast cancer ([place])   . Cancer ([place])   . History of breast cancer 09/2014   left  . History of chemotherapy    finished 02/10/2015  . History of radiation therapy 04/16/2015 - 06/02/2015  . Personal history of chemotherapy   . Personal history of radiation therapy  PAST SURGICAL HISTORY: Past Surgical History:  Procedure Laterality Date  . AUGMENTATION MAMMAPLASTY Bilateral   . BREAST LUMPECTOMY    . BREAST RECONSTRUCTION WITH PLACEMENT OF TISSUE EXPANDER AND FLEX HD (ACELLULAR HYDRATED DERMIS) Right  09/26/2017   Procedure: RIGHT BREAST RECONSTRUCTION WITH PLACEMENT OF TISSUE EXPANDER AND FLEX HD (ACELLULAR HYDRATED DERMIS);  Surgeon: Irene Limbo, MD;  Location: [facility];  Service: Plastics;  Laterality: Right;  . LAPAROSCOPIC OOPHERECTOMY Bilateral   . LAPAROSCOPIC SALPINGO OOPHERECTOMY Bilateral 11/12/2015   Procedure: LAPAROSCOPIC BILATERAL SALPINGO OOPHORECTOMY;  Surgeon: Paula Compton, MD;  Location: [facility];  Service: Gynecology;  Laterality: Bilateral;  . LATISSIMUS FLAP TO BREAST Left 09/26/2017   Procedure: LATISSIMUS FLAP TO LEFT BREAST;  Surgeon: Irene Limbo, MD;  Location: [facility];  Service: Plastics;  Laterality: Left;  . LESION REMOVAL Left 10/22/2015   Procedure: EXCISION OF FOREHEAD LESION 1X1CM;  Surgeon: Rolm Bookbinder, MD;  Location: [facility];  Service: General;  Laterality: Left;  Marland Kitchen MASTECTOMY Bilateral   . NIPPLE SPARING MASTECTOMY Bilateral 09/26/2017   Procedure: LEFT NIPPLE SPARING MASTECTOMY, RIGHT PROPHYLACTIC NIPPLE SPARING MASTECTOMY;  Surgeon: Rolm Bookbinder, MD;  Location: [facility];  Service: General;  Laterality: Bilateral;  . PORT-A-CATH REMOVAL N/A 10/22/2015   Procedure: REMOVAL PORT-A-CATH;  Surgeon: Rolm Bookbinder, MD;  Location: [facility];  Service: General;  Laterality: N/A;  . PORTACATH PLACEMENT N/A 10/15/2014   Procedure: INSERTION PORT-A-CATH;  Surgeon: Rolm Bookbinder, MD;  Location: [facility];  Service: General;  Laterality: N/A;  . RADIOACTIVE SEED GUIDED EXCISIONAL BREAST BIOPSY Left 08/17/2017   Procedure: LEFT RADIOACTIVE SEED GUIDED EXCISIONAL BREAST BIOPSY ERAS PATHWAY;  Surgeon: Rolm Bookbinder, MD;  Location: [facility];  Service: General;  Laterality: Left;  . RADIOACTIVE SEED GUIDED PARTIAL MASTECTOMY WITH AXILLARY SENTINEL LYMPH NODE BIOPSY Left 03/06/2015   Procedure: RADIOACTIVE SEED GUIDED PARTIAL MASTECTOMY WITH AXILLARY SENTINEL LYMPH NODE BIOPSY;  Surgeon: Rolm Bookbinder,  MD;  Location: [facility];  Service: General;  Laterality: Left;  . REMOVAL OF BILATERAL TISSUE EXPANDERS WITH PLACEMENT OF BILATERAL BREAST IMPLANTS Bilateral 01/06/2018   Procedure: REMOVAL OF BILATERAL TISSUE EXPANDERS WITH PLACEMENT OF BILATERAL BREAST IMPLANTS;  Surgeon: Irene Limbo, MD;  Location: [facility];  Service: Plastics;  Laterality: Bilateral;  . TONSILLECTOMY  1996    FAMILY HISTORY: Family History  Problem Relation Age of Onset  . Skin cancer Father   . Hyperlipidemia Father   . Prostate cancer Father   . Prostate cancer Maternal Uncle 96       currently 28  . Multiple myeloma Paternal Grandmother 62       Deceased 49  . Cancer Paternal Grandmother        bone  . Prostate cancer Paternal Uncle   . Lung disease Maternal Grandmother   . AAA (abdominal aortic aneurysm) Maternal Grandmother   . Stroke Maternal Grandfather   . Colon cancer Neg Hx   . Esophageal cancer Neg Hx   . Rectal cancer Neg Hx   . Stomach cancer Neg Hx    The patient's parents are both living. The patient has one brother, no sisters. One grandmother was diagnosed with multiple myeloma at age 71. There is no history of breast or ovarian cancer in the family.    GYNECOLOGIC HISTORY:  Patient's last menstrual period was 02/22/2015.  Menarche age 36, first live birth age 46. The patient is GX P2. Nancy Austin was still having regular periods at the  start of chemotherapy. Nancy Austin was on birth control pills on and off for the last 15 years, stopping in October of 2015.    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 Nancy husband Rolla Plate works as a Immunologist at [employer]. Their children are aged 93, and 49 as of August 2018   ADVANCED DIRECTIVES: In place   HEALTH MAINTENANCE: Social History   Tobacco Use  . Smoking status: Never Smoker  . Smokeless tobacco: Never Used  Vaping Use  . Vaping Use: Never used  Substance Use Topics  . Alcohol  use: Yes    Comment: 3 x/week  . Drug use: No    Colonoscopy:  PAP:    Bone density:  Lipid panel:  Allergies  Allergen Reactions  . Chlorhexidine Itching and Rash  . Morphine And Related Nausea And Vomiting  . Other Rash    Dermabond  . Tegaderm Ag Mesh [Silver] Rash    Reports that the larger tegaderm causes rashes    Current Outpatient Medications  Medication Sig Dispense Refill  . celecoxib (CELEBREX) 100 MG capsule Take 100 mg by mouth daily.    . cholecalciferol (VITAMIN D3) 25 MCG (1000 UT) tablet Take 1 tablet (1,000 Units total) by mouth daily.    Marland Kitchen estradiol (ESTRING) 2 MG vaginal ring Place 2 mg vaginally every 3 (three) months. follow package directions 1 each 12  . gabapentin (NEURONTIN) 300 MG capsule Take at bedtime 90 capsule 4  . Multiple Vitamin (MULTI-VITAMINS) TABS Take by mouth.    . tamoxifen (NOLVADEX) 20 MG tablet Take 1 tablet (20 mg total) by mouth daily. 90 tablet 4   No current facility-administered medications for this visit.    OBJECTIVE: White woman who appears stated age  23:   08/12/20 0948  BP: 124/78  Pulse: (!) 45  Resp: 18  SpO2: 100%   Wt Readings from Last 3 Encounters:  08/12/20 117 lb 12.8 oz (53.4 kg)  08/07/20 118 lb 2 oz (53.6 kg)  04/22/20 116 lb 2 oz (52.7 kg)   Body mass index is 19.01 kg/m.    ECOG FS:1 - Symptomatic but completely ambulatory  Sclerae unicteric, EOMs intact Wearing a mask No cervical or supraclavicular adenopathy Lungs no rales or rhonchi Heart regular rate and rhythm Abd soft, nontender, positive bowel sounds MSK no focal spinal tenderness, no upper extremity lymphedema Neuro: nonfocal, well oriented, appropriate affect Breasts: Status post bilateral mastectomies, with bilateral reconstruction.  There is no evidence of disease recurrence.  Both axillae are benign.  LAB RESULTS:  CMP     Component Value Date/Time   NA 139 08/12/2020 0928   NA 141 08/02/2017 1536   K 4.0 08/12/2020  0928   K 4.0 08/02/2017 1536   CL 104 08/12/2020 0928   CO2 28 08/12/2020 0928   CO2 32 (H) 08/02/2017 1536   GLUCOSE 92 08/12/2020 0928   GLUCOSE 93 08/02/2017 1536   BUN 11 08/12/2020 0928   BUN 11.4 08/02/2017 1536   CREATININE 0.75 08/12/2020 0928   CREATININE 0.8 08/02/2017 1536   CALCIUM 10.1 08/12/2020 0928   CALCIUM 9.8 08/02/2017 1536   PROT 7.0 08/12/2020 0928   PROT 6.7 08/02/2017 1536   ALBUMIN 4.1 08/12/2020 0928   ALBUMIN 3.9 08/02/2017 1536   AST 23 08/12/2020 0928   AST 22 08/02/2017 1536   ALT 14 08/12/2020 0928   ALT 12 08/02/2017 1536   ALKPHOS 47 08/12/2020 8341  ALKPHOS 51 08/02/2017 1536   BILITOT 0.4 08/12/2020 0928   BILITOT 0.31 08/02/2017 1536   GFRNONAA >60 08/12/2020 0928   GFRAA >60 08/12/2020 0928    I No results found for: SPEP  Lab Results  Component Value Date   WBC 2.6 (L) 08/12/2020   NEUTROABS 1.2 (L) 08/12/2020   HGB 13.5 08/12/2020   HCT 41.9 08/12/2020   MCV 88.2 08/12/2020   PLT 184 08/12/2020      Chemistry      Component Value Date/Time   NA 139 08/12/2020 0928   NA 141 08/02/2017 1536   K 4.0 08/12/2020 0928   K 4.0 08/02/2017 1536   CL 104 08/12/2020 0928   CO2 28 08/12/2020 0928   CO2 32 (H) 08/02/2017 1536   BUN 11 08/12/2020 0928   BUN 11.4 08/02/2017 1536   CREATININE 0.75 08/12/2020 0928   CREATININE 0.8 08/02/2017 1536      Component Value Date/Time   CALCIUM 10.1 08/12/2020 0928   CALCIUM 9.8 08/02/2017 1536   ALKPHOS 47 08/12/2020 0928   ALKPHOS 51 08/02/2017 1536   AST 23 08/12/2020 0928   AST 22 08/02/2017 1536   ALT 14 08/12/2020 0928   ALT 12 08/02/2017 1536   BILITOT 0.4 08/12/2020 0928   BILITOT 0.31 08/02/2017 1536       No results found for: LABCA2  No components found for: LABCA125  No results for input(s): INR in the last 168 hours.  Urinalysis No results found for: COLORURINE   STUDIES: No results found.    ASSESSMENT: 44 y.o. BRCA negative [place] woman s/p Left  breast upper outer quadrant biopsy 09/20/2014, for a clinical T1c N0, stage IA invasive ductal carcinoma, grade 2, estrogen and progesterone receptor positive, Nancy-2 amplified with a signal is ratio of 5.07, and an MIB-1 of 16%  (1) neoadjuvant treatment consisting of carboplatin, docetaxel, trastuzumab and pertuzumab given every 21 days x6,  completed 02/10/2015  (a) breast MRI 02/11/2015 showed a complete radiologic response  (2) left lumpectomy and sentinel lymph node sampling 03/06/2015 showed a residual ypT1c ypN1a, stage IIA invasive ductal carcinoma, grade 1, with repeat prognostic panel still triple positive, and negative margins  (3) trastuzumab continued to total one year (last dose 10/01/2015)  (a) final echo 08/07/2015 finds an ejection fraction of 55-60%.  (4) adjuvant radiation 04/16/2015-06/02/2015 Left breast 50.4 gray in 28 fractions, axillary/supraclavicular region 45 gray in 25 fractions, lumpectomy boost 10 gray in 5 fractions  (5) started tamoxifen 06/26/2015  (a) goserelin started 04/03/2015, last dose 10/06/2015  (b) laparoscopic bilateral salpingo-oophorectomy 11/12/2015 with benign pathology  (6)  the [employer] geneprofile  ([facility]) obtained November 2015 showed no deleterious mutations in ATM, BARD1, BRCA1, BRCA BRIP1, CDH1, CHEK2, MRE11A, MUTYH, NBN, NF1, PALB2, PTEN, RAD50, RAD51C, RAD51D, or TP53  (7) left lumpectomy 08/17/2017 showed ductal carcinoma in situ, grade 2, estrogen and progesterone receptor negative, with close though negative margins  (8) bilateral mastectomies 09/26/2017 showed  (a) on the right, no malignancy  (b) on the left, ductal carcinoma in situ, grade 2, with a close but negative anterior margin (skin)  (9) iron deficiency anemia: Resolved   PLAN: Skylan is now just about 3 years out from definitive surgery for Nancy noninvasive breast cancer, and 5-1/2 years out from definitive surgery for Nancy invasive breast cancer.  There is no  evidence of disease recurrence.  This is very favorable.  Nancy Austin has taken tamoxifen for 5 years.  Nancy Austin has the option  to discontinue.  We discussed the fact that continuing tamoxifen in another 5 years will further reduce the risk of recurrence by few percent, perhaps 3%; it can improve bone density; and I think in Nancy case more importantly it will allow Nancy to safely use vaginal estrogens since vaginal dryness and dyspareunia is really Nancy chief complaint.  Nancy Austin did try vaginal creams and suppositories a couple of times a week without much success.  Nancy Austin might do better with Estring and I went ahead and placed that prescription in for Nancy.  We also discussed our pelvic rehab program and I have made a referral.  Nancy Austin will have repeat breast ultrasound in September to follow what probably is an area of fatty degeneration.  Otherwise Nancy Austin will return to see me in 1 year.  Nancy Austin knows to call for any other issue that may develop before then  Total encounter time 25 minutes.*   Mohamedamin Nifong, Virgie Dad, MD  08/12/20 10:21 AM Medical Oncology and Hematology [facility] [place], [place] [address] Tel. [phone]    Fax. [phone]   I, Wilburn Mylar, am acting as scribe for Dr. Virgie Dad. Aracelli Woloszyn.  I, Lurline Del MD, have reviewed the above documentation for accuracy and completeness, and I agree with the above.    *Total Encounter Time as defined by the Centers for Medicare and Medicaid Services includes, in addition to the face-to-face time of a patient visit (documented in the note above) non-face-to-face time: obtaining and reviewing outside history, ordering and reviewing medications, tests or procedures, care coordination (communications with other health care professionals or caregivers) and documentation in the medical record.

## 2020-08-26 ENCOUNTER — Other Ambulatory Visit: Payer: Self-pay

## 2020-08-26 ENCOUNTER — Ambulatory Visit
Admission: RE | Admit: 2020-08-26 | Discharge: 2020-08-26 | Disposition: A | Discharge status: Home / Self Care | Payer: 59 | Source: Ambulatory Visit | Attending: Adult Health | Admitting: Adult Health

## 2020-08-26 DIAGNOSIS — N6489 Other specified disorders of breast: Secondary | ICD-10-CM | POA: Diagnosis not present

## 2020-08-26 DIAGNOSIS — C50412 Malignant neoplasm of upper-outer quadrant of left female breast: Secondary | ICD-10-CM

## 2020-09-10 ENCOUNTER — Ambulatory Visit: Admit: 2020-09-10 | Discharge status: Absent without leave | Payer: 59

## 2020-09-15 DIAGNOSIS — Z9013 Acquired absence of bilateral breasts and nipples: Secondary | ICD-10-CM | POA: Diagnosis not present

## 2020-09-15 DIAGNOSIS — Z853 Personal history of malignant neoplasm of breast: Secondary | ICD-10-CM | POA: Diagnosis not present

## 2020-09-15 DIAGNOSIS — Z923 Personal history of irradiation: Secondary | ICD-10-CM | POA: Diagnosis not present

## 2020-09-26 ENCOUNTER — Ambulatory Visit: Payer: 59 | Attending: Oncology | Admitting: Physical Therapy

## 2020-09-26 ENCOUNTER — Other Ambulatory Visit: Payer: Self-pay

## 2020-09-26 ENCOUNTER — Encounter: Payer: Self-pay | Admitting: Physical Therapy

## 2020-09-26 DIAGNOSIS — M545 Low back pain, unspecified: Secondary | ICD-10-CM | POA: Diagnosis not present

## 2020-09-26 DIAGNOSIS — G8929 Other chronic pain: Secondary | ICD-10-CM | POA: Insufficient documentation

## 2020-09-26 DIAGNOSIS — R278 Other lack of coordination: Secondary | ICD-10-CM | POA: Diagnosis not present

## 2020-09-26 DIAGNOSIS — M6281 Muscle weakness (generalized): Secondary | ICD-10-CM | POA: Diagnosis not present

## 2020-09-26 DIAGNOSIS — R252 Cramp and spasm: Secondary | ICD-10-CM | POA: Diagnosis not present

## 2020-09-26 NOTE — Therapy (Signed)
[facility] [address], [place], [place], [address] Phone: [phone]   Fax:  [phone]  Physical Therapy Evaluation  Patient Details  Name: Nancy Austin MRN: [id number] Date of Birth: 03/29/1976 No data recorded  Encounter Date: 09/26/2020   PT End of Session - 09/26/20 1123    Visit Number 1    Date for PT Re-Evaluation 12/05/20    Authorization Type UMR    PT Start Time 1100    PT Stop Time 1140    PT Time Calculation (min) 40 min    Activity Tolerance Patient tolerated treatment well    Behavior During Therapy [facility] for tasks assessed/performed           Past Medical History:  Diagnosis Date  . Allergy   . Anemia   . Benign skin lesion of forehead 10/2015  . Breast cancer ([place])   . Cancer ([place])   . History of breast cancer 09/2014   left  . History of chemotherapy    finished 02/10/2015  . History of radiation therapy 04/16/2015 - 06/02/2015  . Personal history of chemotherapy   . Personal history of radiation therapy     Past Surgical History:  Procedure Laterality Date  . AUGMENTATION MAMMAPLASTY Bilateral   . BREAST LUMPECTOMY    . BREAST RECONSTRUCTION WITH PLACEMENT OF TISSUE EXPANDER AND FLEX HD (ACELLULAR HYDRATED DERMIS) Right 09/26/2017   Procedure: RIGHT BREAST RECONSTRUCTION WITH PLACEMENT OF TISSUE EXPANDER AND FLEX HD (ACELLULAR HYDRATED DERMIS);  Surgeon: Irene Limbo, MD;  Location: [place];  Service: Plastics;  Laterality: Right;  . LAPAROSCOPIC OOPHERECTOMY Bilateral   . LAPAROSCOPIC SALPINGO OOPHERECTOMY Bilateral 11/12/2015   Procedure: LAPAROSCOPIC BILATERAL SALPINGO OOPHORECTOMY;  Surgeon: Paula Compton, MD;  Location: [facility];  Service: Gynecology;  Laterality: Bilateral;  . LATISSIMUS FLAP TO BREAST Left 09/26/2017   Procedure: LATISSIMUS FLAP TO LEFT BREAST;  Surgeon: Irene Limbo, MD;  Location: [place];  Service: Plastics;  Laterality: Left;  . LESION REMOVAL Left  10/22/2015   Procedure: EXCISION OF FOREHEAD LESION 1X1CM;  Surgeon: Rolm Bookbinder, MD;  Location: [place];  Service: General;  Laterality: Left;  Marland Kitchen MASTECTOMY Bilateral   . NIPPLE SPARING MASTECTOMY Bilateral 09/26/2017   Procedure: LEFT NIPPLE SPARING MASTECTOMY, RIGHT PROPHYLACTIC NIPPLE SPARING MASTECTOMY;  Surgeon: Rolm Bookbinder, MD;  Location: [place];  Service: General;  Laterality: Bilateral;  . PORT-A-CATH REMOVAL N/A 10/22/2015   Procedure: REMOVAL PORT-A-CATH;  Surgeon: Rolm Bookbinder, MD;  Location: [place];  Service: General;  Laterality: N/A;  . PORTACATH PLACEMENT N/A 10/15/2014   Procedure: INSERTION PORT-A-CATH;  Surgeon: Rolm Bookbinder, MD;  Location: [facility];  Service: General;  Laterality: N/A;  . RADIOACTIVE SEED GUIDED EXCISIONAL BREAST BIOPSY Left 08/17/2017   Procedure: LEFT RADIOACTIVE SEED GUIDED EXCISIONAL BREAST BIOPSY ERAS PATHWAY;  Surgeon: Rolm Bookbinder, MD;  Location: [place];  Service: General;  Laterality: Left;  . RADIOACTIVE SEED GUIDED PARTIAL MASTECTOMY WITH AXILLARY SENTINEL LYMPH NODE BIOPSY Left 03/06/2015   Procedure: RADIOACTIVE SEED GUIDED PARTIAL MASTECTOMY WITH AXILLARY SENTINEL LYMPH NODE BIOPSY;  Surgeon: Rolm Bookbinder, MD;  Location: [place];  Service: General;  Laterality: Left;  . REMOVAL OF BILATERAL TISSUE EXPANDERS WITH PLACEMENT OF BILATERAL BREAST IMPLANTS Bilateral 01/06/2018   Procedure: REMOVAL OF BILATERAL TISSUE EXPANDERS WITH PLACEMENT OF BILATERAL BREAST IMPLANTS;  Surgeon: Irene Limbo, MD;  Location: [place];  Service: Plastics;  Laterality: Bilateral;  .  TONSILLECTOMY  1996    There were no vitals filed for this visit.    Subjective Assessment - 09/26/20 1103    Subjective Patient had breast cancer and ovaries have been removed. She has tried the creams for vaginal dryness. Pain with intercourse. Leak urine.    Patient  Stated Goals reduce pain with leakage and improve pain with intercourse    Currently in Pain? Yes    Pain Score 7     Pain Location Vagina    Pain Orientation Mid    Pain Descriptors / Indicators Sharp;Burning    Pain Type Acute pain    Pain Onset More than a month ago    Pain Frequency Intermittent    Aggravating Factors  deep and initial penetration with intercourse    Pain Relieving Factors no intercourse    Multiple Pain Sites Yes    Pain Score 9   low 2-3/10   Pain Location Back    Pain Orientation Lower    Pain Descriptors / Indicators Shooting   unstable   Pain Type Chronic pain    Pain Onset More than a month ago    Pain Frequency Intermittent    Aggravating Factors  lifting, running,    Pain Relieving Factors walking instead of running on painful days              [facility] PT Assessment - 09/26/20 0001      Assessment   Onset Date/Surgical Date --   09/2014     Precautions   Precautions Other (comment)    Precaution Comments breast cancer with radiation and chemotherapy      Restrictions   Weight Bearing Restrictions No      Balance Screen   Has the patient fallen in the past 6 months No    Has the patient had a decrease in activity level because of a fear of falling?  No    Is the patient reluctant to leave their home because of a fear of falling?  No      Home Ecologist residence      Prior Function   Level of Independence Independent    Leisure run, lift weights, walking      Cognition   Overall Cognitive Status Within Functional Limits for tasks assessed      Posture/Postural Control   Posture/Postural Control Postural limitations    Postural Limitations Increased thoracic kyphosis      ROM / Strength   AROM / PROM / Strength AROM;PROM;Strength      AROM   Overall AROM Comments lumbar ROM decreased by 25%      PROM   Overall PROM Comments left hip ER is tighter      Strength   Right Hip ABduction 3+/5     Right Hip ADduction 3+/5    Left Hip External Rotation 4/5    Left Hip ADduction 4/5      Palpation   SI assessment  left ilium post. rotated                      Objective measurements completed on examination: See above findings.     Pelvic Floor Special Questions - 09/26/20 0001    Prior Pregnancies Yes    Number of Vaginal Deliveries 2    Any difficulty with labor and deliveries No    Currently Sexually Active Yes    Is this Painful Yes    Marinoff  Scale discomfort that does not affect completion    Urinary Leakage Yes    Pad use 0    Activities that cause leaking Running;Sneezing;With strong urge    Urinary frequency sometimes leaks while getting to the bathroom    Fecal incontinence No   sometimes has to straiin to have aBM   Falling out feeling (prolapse) No    Skin Integrity Intact   dryness, good pink color   Pelvic Floor Internal Exam Patient confirms identification and approves PT to assess pelvic floor and treatment    Exam Type Vaginal    Palpation tenderness located in the right obturator internist, bil. puborectalis, decreased contraction of the left obturator internist, decreased mobility of the perineal body, tenderess located on urethra sphincter    Strength weak squeeze, no lift                    PT Education - 09/26/20 1150    Education Details education on vaginal lubricants and moisturizers; education on how to massage the pelvic floor    Person(s) Educated Patient    Methods Explanation;Demonstration;Verbal cues;Handout    Comprehension Returned demonstration;Verbalized understanding            PT Short Term Goals - 09/26/20 1201      PT SHORT TERM GOAL #1   Title Pt will be independent with her HEP    Baseline --    Time 4    Period Weeks    Status New    Target Date 10/24/20      PT SHORT TERM GOAL #2   Title able to perform pelvic floor manual work to improve tissue mobiity    Time 4    Period Weeks    Status  New    Target Date 10/24/20      PT SHORT TERM GOAL #3   Title understand ways to moisturize the vaginal tissue to improve dryness    Time 4    Period Weeks    Status New    Target Date 10/24/20      PT SHORT TERM GOAL #4   Title understand what lubricants to use for intercourse that are alright for the vaginal mucosa    Time 4    Period Weeks    Status New    Target Date 10/24/20             PT Long Term Goals - 09/26/20 1203      PT LONG TERM GOAL #1   Title independent with advanced HEP    Baseline --    Time 10    Period Weeks    Status New    Target Date 12/05/20      PT LONG TERM GOAL #2   Title able to have penile penetration with pain level </= 1/10 due to improve mobility and hydration of tissue    Baseline --    Time 12    Period Weeks    Status New    Target Date 12/19/20      PT LONG TERM GOAL #3   Title urinary leakage with running and coughing decreased >/= 80% due to improved strength and coordination of the pelvic floor    Baseline --    Time 10    Period Weeks    Status New    Target Date 12/05/20      PT LONG TERM GOAL #4   Title lumbar pain decreased >/= 75% while she runs  and lift due to reduction of thoracic kyphosis and improved core strength    Time 10    Period Weeks    Status New    Target Date 12/05/20                  Plan - 09/26/20 1152    Clinical Impression Statement Patient is a 44 year old female history of breast cancer with radiation and chemotherapy. She is presently on Tamoxifen. Patient tried Estrace cream. Patient reports pain with penile penetration is 7/10 with initial and deep penetration. Patient leaks urine with coughing and running and when has the urge. She will leak urine as she is walking to the commode. Pelvic floor strength is 2/5 with no lift. Tenderness located in bilateral urethra sphincter, puborectalis, and right obturator internist. Decreased mobility of the perineal body. Patient reports  constipation occasionally. Her posture consists of increased thoracic kyphosis.Patietn reports lumbar pain could be 9/10 with running and lifting due to increased thoracic kyphosis.  Bilateral hip abduction and adductor weakness. Patient would benefit from skilled therapy to reduce pain and improve pelvic floor coordination to reduce leakage.    Personal Factors and Comorbidities Comorbidity 2;Sex;Fitness    Comorbidities Left breast cancer with radiation and chemotherapy 12/2013; 11/12/2015 laparoscompis salpingo-oopherectomy; Latissimus flap left breast 09/26/2017; Patient on Tamoxifen    Examination-Activity Limitations Continence    Examination-Participation Restrictions Interpersonal Relationship    Stability/Clinical Decision Making Evolving/Moderate complexity    Clinical Decision Making Moderate    Rehab Potential Excellent    PT Frequency 1x / week    PT Duration Other (comment)   10 weeks   PT Treatment/Interventions ADLs/Self Care Home Management;Cryotherapy;Moist Heat;Electrical Stimulation;Therapeutic activities;Therapeutic exercise;Neuromuscular re-education;Manual techniques;Patient/family education;Dry needling;Spinal Manipulations    PT Next Visit Plan expanding the pelvic floor, joint mobilization to thoracic; diaphragmatic breathing, hip stretches for pelvic floor, manual work to pelvic floor    Consulted and Agree with Plan of Care Patient           Patient will benefit from skilled therapeutic intervention in order to improve the following deficits and impairments:  Decreased coordination, Decreased range of motion, Increased fascial restricitons, Pain, Decreased strength, Decreased activity tolerance, Decreased endurance, Increased muscle spasms  Visit Diagnosis: Muscle weakness (generalized) - Plan: PT plan of care cert/re-cert  Other lack of coordination - Plan: PT plan of care cert/re-cert  Cramp and spasm - Plan: PT plan of care cert/re-cert  Chronic midline low  back pain without sciatica - Plan: PT plan of care cert/re-cert     Problem List Patient Active Problem List   Diagnosis Date Noted  . Leukopenia 08/13/2019  . Iron deficiency anemia 12/20/2018  . Vitamin D deficiency 05/01/2018  . Absence of breast, acquired, bilateral 10/26/2017  . Ductal carcinoma in situ (DCIS) of left breast 09/26/2017  . Physical exam 03/28/2017  . Low back pain at multiple sites 05/26/2016  . Genetic testing 10/17/2015  . Chemotherapy induced neutropenia ([place]) 02/03/2015  . Neuropathy due to chemotherapeutic drug ([place]) 01/13/2015  . Constipation 12/09/2014  . Hot flashes 12/09/2014  . Spotting 11/20/2014  . Vasovagal near syncope 11/16/2014  . Malignant neoplasm of upper-outer quadrant of left breast in female, estrogen receptor positive ([place]) 09/24/2014    Earlie Counts, PT 09/26/20 12:10 PM   [facility] [address] [place], [place], [address] Phone: [phone]   Fax:  [phone]  Name: Nancy Austin MRN: [id number] Date of Birth: 29-May-1976

## 2020-09-26 NOTE — Patient Instructions (Addendum)
Moisturizers . They are used in the vagina to hydrate the mucous membrane that make up the vaginal canal. . Designed to keep a more normal acid balance (ph) . Once placed in the vagina, it will last between two to three days.  . Use 2-3 times per week at bedtime  . Ingredients to avoid is glycerin and fragrance, can increase chance of infection . Should not be used just before sex due to causing irritation . Most are gels administered either in a tampon-shaped applicator or as a vaginal suppository. They are non-hormonal.   Types of Moisturizers(internal use)  . Vitamin E vaginal suppositories- Whole foods, Amazon . Moist Again . Coconut oil- can break down condoms .  Marland Kitchen Yes moisturizer- amazon . NeuEve Silk , NeuEve Silver for menopausal or over 65 (if have severe vaginal atrophy or cancer treatments use NeuEve Silk for  1 month than move to [employer])- [employer], MapleFlower.dk . Olive and Bee intimate cream- www.oliveandbee.com.au . Mae vaginal [place] . Aloe .    Creams to use externally on the Vulva area  Albertson's (good for for cancer patients that had radiation to the area)- [place] or [employer].FlyingBasics.com.br  V-magic cream - amazon    Vital "V Wild Yam salve ( help moisturize and help with thinning vulvar area, does have Sierra by Irwin Brakeman labial moisturizer ([place],   Coconut or olive oil  aloe   Things to avoid in the vaginal area . Do not use things to irritate the vulvar area . No lotions just specialized creams for the vulva area- Neogyn, V-magic, No soaps; can use Aveeno or Calendula cleanser if needed. Must be gentle . No deodorants . No douches . Good to sleep without underwear to let the vaginal area to air out . No scrubbing: spread the lips to let warm water rinse over labias and pat dry   Lubrication . Used for intercourse to reduce  friction . Avoid ones that have glycerin, warming gels, tingling gels, icing or cooling gel, scented . Avoid parabens due to a preservative similar to female sex hormone . May need to be reapplied once or several times during sexual activity . Can be applied to both partners genitals prior to vaginal penetration to minimize friction or irritation . Prevent irritation and mucosal tears that cause post coital pain and increased the risk of vaginal and urinary tract infections . Oil-based lubricants cannot be used with condoms due to breaking them down.  Least likely to irritate vaginal tissue.  . Plant based-lubes are safe . Silicone-based lubrication are thicker and last long and used for post-menopausal women  Vaginal Lubricators Here is a list of some suggested lubricators you can use for intercourse. Use the most hypoallergenic product.  You can place on you or your partner.   Slippery Stuff ( water based)  Sylk or Sliquid Natural H2O ( good  if frequent UTI's)- walmart, amazon  Sliquid organics silk-(aloe and silicone based )  [employer] (www.blossom-organics.com)- (aloe based )  Coconut oil, olive oil -not good with condoms   PJur Woman Nude- (water based) amazon  Uberlube- ( silicon) Starbuck has an organic one  Yes lubricant- (water based and has plant oil based similar to silicone) [employer] Platinum-Silicone, Target, Walgreens  Olive and Bee intimate cream-  www.oliveandbee.com.au  Martin- Paediatric nurse, [employer]  to avoid in lubricants are glycerin, warming gels, tingling gels, icing or cooling  gels, and scented gels.  Also avoid Vaseline. KY jelly, Replens, and Astroglide kills good bacteria(lactobacilli)  Things to avoid in the vaginal area . Do not use things to irritate the vulvar area . No lotions- see below . No soaps; can use Aveeno or Calendula cleanser if needed. Must be gentle . No  deodorants . No douches . Good to sleep without underwear to let the vaginal area to air out . No scrubbing: spread the lips to let warm water rinse over labias and pat dry  Creams that can be used on the Vulva Area  V [employer], walmart  Vital V [employer] Botanical Pro-Meno Wild Yam Cream  Coconut oil, olive oil  Cleo by [employer] labial moisturizer -Bude,   [place] Releveum ( lidocaine) or [facility] [address], [place], [place] [address] Phone # [phone] Fax [phone]

## 2020-10-06 ENCOUNTER — Encounter: Payer: Self-pay | Admitting: Physical Therapy

## 2020-10-06 ENCOUNTER — Other Ambulatory Visit: Payer: Self-pay

## 2020-10-06 ENCOUNTER — Ambulatory Visit: Payer: 59 | Admitting: Physical Therapy

## 2020-10-06 DIAGNOSIS — G8929 Other chronic pain: Secondary | ICD-10-CM | POA: Diagnosis not present

## 2020-10-06 DIAGNOSIS — R278 Other lack of coordination: Secondary | ICD-10-CM | POA: Diagnosis not present

## 2020-10-06 DIAGNOSIS — M6281 Muscle weakness (generalized): Secondary | ICD-10-CM | POA: Diagnosis not present

## 2020-10-06 DIAGNOSIS — M545 Low back pain, unspecified: Secondary | ICD-10-CM | POA: Diagnosis not present

## 2020-10-06 DIAGNOSIS — R252 Cramp and spasm: Secondary | ICD-10-CM | POA: Diagnosis not present

## 2020-10-06 NOTE — Therapy (Signed)
[facility] [address] [place], [place], [address] Phone: [phone]   Fax:  [phone]  Physical Therapy Treatment  Patient Details  Name: Nancy Austin MRN: [id number] Date of Birth: Jan 04, 1976 No data recorded  Encounter Date: 10/06/2020   PT End of Session - 10/06/20 1144    Visit Number 2    Date for PT Re-Evaluation 12/05/20    Authorization Type UMR    PT Start Time 1100    PT Stop Time 1140    PT Time Calculation (min) 40 min    Activity Tolerance Patient tolerated treatment well    Behavior During Therapy [facility] for tasks assessed/performed           Past Medical History:  Diagnosis Date   Allergy    Anemia    Benign skin lesion of forehead 10/2015   Breast cancer ([place])    Cancer ([place])    History of breast cancer 09/2014   left   History of chemotherapy    finished 02/10/2015   History of radiation therapy 04/16/2015 - 06/02/2015   Personal history of chemotherapy    Personal history of radiation therapy     Past Surgical History:  Procedure Laterality Date   AUGMENTATION MAMMAPLASTY Bilateral    BREAST LUMPECTOMY     BREAST RECONSTRUCTION WITH PLACEMENT OF TISSUE EXPANDER AND FLEX HD (ACELLULAR HYDRATED DERMIS) Right 09/26/2017   Procedure: RIGHT BREAST RECONSTRUCTION WITH PLACEMENT OF TISSUE EXPANDER AND FLEX HD (ACELLULAR HYDRATED DERMIS);  Surgeon: Irene Limbo, MD;  Location: [facility];  Service: Plastics;  Laterality: Right;   LAPAROSCOPIC OOPHERECTOMY Bilateral    LAPAROSCOPIC SALPINGO OOPHERECTOMY Bilateral 11/12/2015   Procedure: LAPAROSCOPIC BILATERAL SALPINGO OOPHORECTOMY;  Surgeon: Paula Compton, MD;  Location: [facility];  Service: Gynecology;  Laterality: Bilateral;   LATISSIMUS FLAP TO BREAST Left 09/26/2017   Procedure: LATISSIMUS FLAP TO LEFT BREAST;  Surgeon: Irene Limbo, MD;  Location: [facility];  Service: Plastics;  Laterality: Left;   LESION REMOVAL Left  10/22/2015   Procedure: EXCISION OF FOREHEAD LESION 1X1CM;  Surgeon: Rolm Bookbinder, MD;  Location: [facility];  Service: General;  Laterality: Left;   MASTECTOMY Bilateral    NIPPLE SPARING MASTECTOMY Bilateral 09/26/2017   Procedure: LEFT NIPPLE SPARING MASTECTOMY, RIGHT PROPHYLACTIC NIPPLE SPARING MASTECTOMY;  Surgeon: Rolm Bookbinder, MD;  Location: [place] [place];  Service: General;  Laterality: Bilateral;   PORT-A-CATH REMOVAL N/A 10/22/2015   Procedure: REMOVAL PORT-A-CATH;  Surgeon: Rolm Bookbinder, MD;  Location: [facility];  Service: General;  Laterality: N/A;   PORTACATH PLACEMENT N/A 10/15/2014   Procedure: INSERTION PORT-A-CATH;  Surgeon: Rolm Bookbinder, MD;  Location: [facility];  Service: General;  Laterality: N/A;   RADIOACTIVE SEED GUIDED EXCISIONAL BREAST BIOPSY Left 08/17/2017   Procedure: LEFT RADIOACTIVE SEED GUIDED EXCISIONAL BREAST BIOPSY ERAS PATHWAY;  Surgeon: Rolm Bookbinder, MD;  Location: [place];  Service: General;  Laterality: Left;   RADIOACTIVE SEED GUIDED PARTIAL MASTECTOMY WITH AXILLARY SENTINEL LYMPH NODE BIOPSY Left 03/06/2015   Procedure: RADIOACTIVE SEED GUIDED PARTIAL MASTECTOMY WITH AXILLARY SENTINEL LYMPH NODE BIOPSY;  Surgeon: Rolm Bookbinder, MD;  Location: [place];  Service: General;  Laterality: Left;   REMOVAL OF BILATERAL TISSUE EXPANDERS WITH PLACEMENT OF BILATERAL BREAST IMPLANTS Bilateral 01/06/2018   Procedure: REMOVAL OF BILATERAL TISSUE EXPANDERS WITH PLACEMENT OF BILATERAL BREAST IMPLANTS;  Surgeon: Irene Limbo, MD;  Location: [place];  Service: Plastics;  Laterality: Bilateral;  TONSILLECTOMY  1996    There were no vitals filed for this visit.   Subjective Assessment - 10/06/20 1101    Subjective I tried the coconut oil.    Patient Stated Goals reduce pain with leakage and improve pain with intercourse    Currently in Pain? Yes    Pain Score 7      Pain Location Vagina    Pain Orientation Mid    Pain Descriptors / Indicators Sharp;Burning    Pain Type Acute pain    Pain Onset More than a month ago    Pain Frequency Intermittent    Aggravating Factors  deep and iniital penetration with intercourse    Pain Relieving Factors no intercourse    Multiple Pain Sites No                             [facility] Adult PT Treatment/Exercise - 10/06/20 0001      Self-Care   Self-Care Other Self-Care Comments    Other Self-Care Comments  reviewed information on vaginal moisutrizers and which ones to try since the coconut oil did not help      Therapeutic Activites    Therapeutic Activities Other Therapeutic Activities    Other Therapeutic Activities correct toileting technique with breathing, knees above hips, expanding the abdomen to reduce constipation      Neuro Re-ed    Neuro Re-ed Details  diaphragmatic breathing with tactile cues to open up the lower rib cage with breath then push the air into the abdomen and pelvci floor to elongate the pelvic floor, took several tries to feel how to open up the pelvic floor      Lumbar Exercises: Stretches   Press Ups 2 reps    Piriformis Stretch Right;Left;1 rep;30 seconds    Piriformis Stretch Limitations supine    Other Lumbar Stretch Exercise happy baby    Other Lumbar Stretch Exercise extend over foam roll perpendicular to the mid thoracic      Lumbar Exercises: Quadruped   Madcat/Old Horse 10 reps      Shoulder Exercises: Prone   Horizontal ABduction 1 Strengthening;Both;15 reps    Horizontal ABduction 1 Limitations tactile cues to retract scapula      Manual Therapy   Manual Therapy Joint mobilization;Soft tissue mobilization    Joint Mobilization PA and rotational mobilization of T4-L1 to improve thoracic extension    Soft tissue mobilization soft tissue work to the [employer],                   PT Education - 10/06/20 1144    Education Details Access Code:  [email or web address]; reviewed vagianl moisturizers, diaphragmatic breathing and toileting    Person(s) Educated Patient    Methods Explanation;Demonstration;Verbal cues;Handout    Comprehension Returned demonstration;Verbalized understanding            PT Short Term Goals - 09/26/20 1201      PT SHORT TERM GOAL #1   Title Pt will be independent with her HEP    Baseline --    Time 4    Period Weeks    Status New    Target Date 10/24/20      PT SHORT TERM GOAL #2   Title able to perform pelvic floor manual work to improve tissue mobiity    Time 4    Period Weeks    Status New    Target Date 10/24/20  PT SHORT TERM GOAL #3   Title understand ways to moisturize the vaginal tissue to improve dryness    Time 4    Period Weeks    Status New    Target Date 10/24/20      PT SHORT TERM GOAL #4   Title understand what lubricants to use for intercourse that are alright for the vaginal mucosa    Time 4    Period Weeks    Status New    Target Date 10/24/20             PT Long Term Goals - 09/26/20 1203      PT LONG TERM GOAL #1   Title independent with advanced HEP    Baseline --    Time 10    Period Weeks    Status New    Target Date 12/05/20      PT LONG TERM GOAL #2   Title able to have penile penetration with pain level </= 1/10 due to improve mobility and hydration of tissue    Baseline --    Time 12    Period Weeks    Status New    Target Date 12/19/20      PT LONG TERM GOAL #3   Title urinary leakage with running and coughing decreased >/= 80% due to improved strength and coordination of the pelvic floor    Baseline --    Time 10    Period Weeks    Status New    Target Date 12/05/20      PT LONG TERM GOAL #4   Title lumbar pain decreased >/= 75% while she runs and lift due to reduction of thoracic kyphosis and improved core strength    Time 10    Period Weeks    Status New    Target Date 12/05/20                 Plan - 10/06/20 1228     Clinical Impression Statement Patient  was using the coconut oil but is not helping the dryness. Patient was educated on other moisturizers she can use. Patient was able to bulge the pelvic floor a little after she had tactile cues to expand the lower rib cage and abdomen. Patient has learned exercises to work her back strength to reduce her thoracic kyphosis. Patient has learned toileting to make it easier for bowel movements. Patient will benefit from skilled therapy to reduce pain and improve pelvic floor coordination to reduce leakage.    Personal Factors and Comorbidities Comorbidity 2;Sex;Fitness    Comorbidities Left breast cancer with radiation and chemotherapy 12/2013; 11/12/2015 laparoscompis salpingo-oopherectomy; Latissimus flap left breast 09/26/2017; Patient on Tamoxifen    Examination-Activity Limitations Continence    Examination-Participation Restrictions Interpersonal Relationship    Stability/Clinical Decision Making Evolving/Moderate complexity    Rehab Potential Excellent    PT Frequency 1x / week    PT Duration Other (comment)   10 weeks   PT Treatment/Interventions ADLs/Self Care Home Management;Cryotherapy;Moist Heat;Electrical Stimulation;Therapeutic activities;Therapeutic exercise;Neuromuscular re-education;Manual techniques;Patient/family education;Dry needling;Spinal Manipulations    PT Next Visit Plan manual work to the pelvic floor after working on the hip adductors, see how to she is doing with dropping the pelvic floor, exercise for the back extensors; go over lubricants; self massage    PT Home Exercise Plan Access Code: [email or web address]    Recommended Other Services MD signed initial eval    Consulted and Agree with Plan of Care Patient  Patient will benefit from skilled therapeutic intervention in order to improve the following deficits and impairments:  Decreased coordination, Decreased range of motion, Increased fascial restricitons, Pain, Decreased strength,  Decreased activity tolerance, Decreased endurance, Increased muscle spasms  Visit Diagnosis: Muscle weakness (generalized)  Other lack of coordination  Cramp and spasm  Chronic midline low back pain without sciatica     Problem List Patient Active Problem List   Diagnosis Date Noted   Leukopenia 08/13/2019   Iron deficiency anemia 12/20/2018   Vitamin D deficiency 05/01/2018   Absence of breast, acquired, bilateral 10/26/2017   Ductal carcinoma in situ (DCIS) of left breast 09/26/2017   Physical exam 03/28/2017   Low back pain at multiple sites 05/26/2016   Genetic testing 10/17/2015   Chemotherapy induced neutropenia ([place]) 02/03/2015   Neuropathy due to chemotherapeutic drug ([place]) 01/13/2015   Constipation 12/09/2014   Hot flashes 12/09/2014   Spotting 11/20/2014   Vasovagal near syncope 11/16/2014   Malignant neoplasm of upper-outer quadrant of left breast in female, estrogen receptor positive ([place]) 09/24/2014    Earlie Counts, PT 10/06/20 12:35 PM   [facility] [address] [place], [place], [address] Phone: [phone]   Fax:  [phone]  Name: Nancy Austin MRN: [id number] Date of Birth: 1976-03-23

## 2020-10-06 NOTE — Patient Instructions (Addendum)
Moisturizers . They are used in the vagina to hydrate the mucous membrane that make up the vaginal canal. . Designed to keep a more normal acid balance (ph) . Once placed in the vagina, it will last between two to three days.  . Use 2-3 times per week at bedtime  . Ingredients to avoid is glycerin and fragrance, can increase chance of infection . Should not be used just before sex due to causing irritation . Most are gels administered either in a tampon-shaped applicator or as a vaginal suppository. They are non-hormonal.   Types of Moisturizers(internal use)  . Vitamin E vaginal suppositories- Whole foods, Amazon . Moist Again . Coconut oil- can break down condoms .  Marland Kitchen Yes moisturizer- amazon . NeuEve Silk , NeuEve Silver for menopausal or over 65 (if have severe vaginal atrophy or cancer treatments use NeuEve Silk for  1 month than move to [employer])- [employer], MapleFlower.dk . Olive and Bee intimate cream- www.oliveandbee.com.au . Mae vaginal [place] . Aloe .    Creams to use externally on the Vulva area  Albertson's (good for for cancer patients that had radiation to the area)- [place] or [employer].FlyingBasics.com.br  V-magic cream - amazon  Vital "V [place] salve ( help moisturize and help with thinning vulvar area, does have [place] by Irwin Brakeman labial moisturizer ([place],   Coconut or olive oil  aloe   Things to avoid in the vaginal area . Do not use things to irritate the vulvar area . No lotions just specialized creams for the vulva area- Neogyn, V-magic, No soaps; can use Aveeno or Calendula cleanser if needed. Must be gentle . No deodorants . No douches . Good to sleep without underwear to let the vaginal area to air out . No scrubbing: spread the lips to let warm water rinse over labias and pat dry  Toileting Techniques for Bowel Movements    An  Evacuation/Defecation Plan   Here are the 4 basic points:  1. Lean forward enough for your elbows to rest on your knees 2. Support your feet on the floor or use a low stool if your feet don't touch the floor  3. Push out your belly as if you have swallowed a beach ball--you should feel a widening of your waist. "Belly Big, Belly Hard" 4. Open and relax your pelvic floor muscles, rather than tightening around the anus  While you are sitting on the toilet pay attention to the following areas: . Jaw and mouth position- relaxed not clenched . Angle of your hips - leaning slightly forward . Whether your feet touch the ground or not - should be flat and supported . Arm placement - rest against your thighs . Spine position - flat back . Waist . Breathing - exhale as you push (like blowing up a balloon or try using other sounds such as ahhhh, shhhhh, ohhhh or grrrrrrr) . Belly - hard and tight as you push . Anus (opening of the anal canal) - relaxed and open as you push . Anus - Tighten and lift pulling the muscle back in after you are done or if taking a break  If you are not successful after 10-15 minutes, try again later.  Avoid negative self-talk about your toileting experience.   Read this for more details and ask your PT if you need suggestions for adjustments or limitations:  1) Sitting on the toilet  a) Make sure your feet are supported - flat on the floor or step stool b) Many people find it effective to lean forward or raise their knees.  Propping your feet on a step stool (Squatty Potty is a brand name) can help the muscles around the anus to relax  c) When you lean forward, place your forearms on your thighs for support  2) Relaxing a) Breathe deeply and slowly in through your nose and out through your mouth. b) To become aware of how to relax your muscles, contracting and releasing muscles can be helpful.  Pull your pelvic floor muscles in tightly by using the image of holding  back gas, or closing around the anus (visualize making a circle smaller) and lifting the anus up and in.  Then release the muscles and your anus should drop down and feel open. Repeat 5 times ending with the feeling of relaxation. c) Keep your pelvic floor muscles relaxed; let your belly bulge out. d) The digestive tract starts at the mouth and ends at the anal opening, so be sure to relax both ends of the tube.  Place your tongue on the roof of your mouth with your teeth separated.  This helps relax your mouth and will help to relax the anus at the same time.  3) Emptying (defecation) a) Keep your pelvic floor and sphincter relaxed, then bulge your anal muscles.  Make the anal opening wide.  b) Stick your belly out as if you have swallowed a beach ball. c) Make your belly wall hard using your belly muscles while continuing to breathe. Doing this makes it easier to open your anus. d) Breath out and give a grunt (or try using other sounds such as ahhhh, shhhhh, ohhhh or grrrrrrr). e)  Can also try to act as if you are blowing up a balloon as you push  4) Finishing a) As you finish your bowel movement, pull the pelvic floor muscles up and in.  This will leave your anus in the proper place rather than remaining pushed out and down. If you leave your anus pushed out and down, it will start to feel as though that is normal and give you incorrect signals about needing to have a bowel movement.  Access Code: [email or web address] URL: [email or web address] Date: 10/06/2020 Prepared by: Earlie Counts  Exercises Supine Pelvic Floor Stretch - 1 x daily - 7 x weekly - 1 sets - 1 reps - 1 min hold Supine Figure 4 Piriformis Stretch with Leg Extension - 1 x daily - 7 x weekly - 1 sets - 2 reps - 30 sec hold Cat Cow - 1 x daily - 7 x weekly - 1 sets - 10 reps Child's Pose Stretch - 1 x daily - 7 x weekly - 1 sets - 1 reps - 30 sec hold Prone Press Up - 1 x daily - 7 x weekly - 1 sets - 10 reps Thoracic  Extension Mobilization on Foam Roll - 1 x daily - 7 x weekly - 1 sets - 10 reps Prone Shoulder Horizontal Abduction - 1 x daily - 7 x weekly - 1 sets - 10 reps Supine Diaphragmatic Breathing with Pelvic Floor Lengthening - 2 x daily - 7 x weekly - 1 sets - 10 reps [facility] [address], [place], [place] [address] Phone # [phone] Fax [phone]

## 2020-10-07 MED FILL — CELECOXIB 100 MG CAPS: 100 | 90 days supply | Qty: 90 | Fill #2

## 2020-10-08 ENCOUNTER — Encounter: Payer: Self-pay | Admitting: Oncology

## 2020-10-08 ENCOUNTER — Other Ambulatory Visit: Payer: Self-pay | Admitting: *Deleted

## 2020-10-08 ENCOUNTER — Other Ambulatory Visit: Payer: Self-pay | Admitting: Adult Health

## 2020-10-08 DIAGNOSIS — C50412 Malignant neoplasm of upper-outer quadrant of left female breast: Secondary | ICD-10-CM

## 2020-10-08 DIAGNOSIS — Z170 Estrogen receptor positive status [ER+]: Secondary | ICD-10-CM

## 2020-10-08 MED ORDER — TAMOXIFEN CITRATE 20 MG PO TABS: 20.0000 mg | ORAL_TABLET | Freq: Every day | ORAL | 4 refills | Status: DC

## 2020-10-08 MED ORDER — GABAPENTIN 300 MG PO CAPS: ORAL_CAPSULE | ORAL | 4 refills | Status: DC

## 2020-10-13 ENCOUNTER — Encounter: Payer: Self-pay | Admitting: Physical Therapy

## 2020-10-13 ENCOUNTER — Other Ambulatory Visit: Payer: Self-pay

## 2020-10-13 ENCOUNTER — Ambulatory Visit: Payer: 59 | Attending: Oncology | Admitting: Physical Therapy

## 2020-10-13 DIAGNOSIS — R252 Cramp and spasm: Secondary | ICD-10-CM | POA: Insufficient documentation

## 2020-10-13 DIAGNOSIS — G8929 Other chronic pain: Secondary | ICD-10-CM | POA: Insufficient documentation

## 2020-10-13 DIAGNOSIS — M6281 Muscle weakness (generalized): Secondary | ICD-10-CM | POA: Insufficient documentation

## 2020-10-13 DIAGNOSIS — R278 Other lack of coordination: Secondary | ICD-10-CM | POA: Diagnosis not present

## 2020-10-13 DIAGNOSIS — M545 Low back pain, unspecified: Secondary | ICD-10-CM | POA: Diagnosis not present

## 2020-10-13 NOTE — Therapy (Signed)
[facility] [address], [place], [place], [address] Phone: [phone]   Fax:  [phone]  Physical Therapy Treatment  Patient Details  Name: Nancy Austin MRN: [id number] Date of Birth: 1976/08/25 No data recorded  Encounter Date: 10/13/2020   Nancy Austin End of Session - 10/13/20 1121    Visit Number 3    Date for Nancy Austin Re-Evaluation 12/05/20    Authorization Type UMR    Nancy Austin Start Time 1100    Nancy Austin Stop Time 1140    Nancy Austin Time Calculation (min) 40 min    Activity Tolerance Patient tolerated treatment well    Behavior During Therapy [facility] for tasks assessed/performed           Past Medical History:  Diagnosis Date  . Allergy   . Anemia   . Benign skin lesion of forehead 10/2015  . Breast cancer ([place])   . Cancer ([place])   . History of breast cancer 09/2014   left  . History of chemotherapy    finished 02/10/2015  . History of radiation therapy 04/16/2015 - 06/02/2015  . Personal history of chemotherapy   . Personal history of radiation therapy     Past Surgical History:  Procedure Laterality Date  . AUGMENTATION MAMMAPLASTY Bilateral   . BREAST LUMPECTOMY    . BREAST RECONSTRUCTION WITH PLACEMENT OF TISSUE EXPANDER AND FLEX HD (ACELLULAR HYDRATED DERMIS) Right 09/26/2017   Procedure: RIGHT BREAST RECONSTRUCTION WITH PLACEMENT OF TISSUE EXPANDER AND FLEX HD (ACELLULAR HYDRATED DERMIS);  Surgeon: Irene Limbo, MD;  Location: [place];  Service: Plastics;  Laterality: Right;  . LAPAROSCOPIC OOPHERECTOMY Bilateral   . LAPAROSCOPIC SALPINGO OOPHERECTOMY Bilateral 11/12/2015   Procedure: LAPAROSCOPIC BILATERAL SALPINGO OOPHORECTOMY;  Surgeon: Paula Compton, MD;  Location: [facility];  Service: Gynecology;  Laterality: Bilateral;  . LATISSIMUS FLAP TO BREAST Left 09/26/2017   Procedure: LATISSIMUS FLAP TO LEFT BREAST;  Surgeon: Irene Limbo, MD;  Location: [place];  Service: Plastics;  Laterality: Left;  . LESION REMOVAL Left  10/22/2015   Procedure: EXCISION OF FOREHEAD LESION 1X1CM;  Surgeon: Rolm Bookbinder, MD;  Location: [place];  Service: General;  Laterality: Left;  Marland Kitchen MASTECTOMY Bilateral   . NIPPLE SPARING MASTECTOMY Bilateral 09/26/2017   Procedure: LEFT NIPPLE SPARING MASTECTOMY, RIGHT PROPHYLACTIC NIPPLE SPARING MASTECTOMY;  Surgeon: Rolm Bookbinder, MD;  Location: [place];  Service: General;  Laterality: Bilateral;  . PORT-A-CATH REMOVAL N/A 10/22/2015   Procedure: REMOVAL PORT-A-CATH;  Surgeon: Rolm Bookbinder, MD;  Location: [place];  Service: General;  Laterality: N/A;  . PORTACATH PLACEMENT N/A 10/15/2014   Procedure: INSERTION PORT-A-CATH;  Surgeon: Rolm Bookbinder, MD;  Location: [facility];  Service: General;  Laterality: N/A;  . RADIOACTIVE SEED GUIDED EXCISIONAL BREAST BIOPSY Left 08/17/2017   Procedure: LEFT RADIOACTIVE SEED GUIDED EXCISIONAL BREAST BIOPSY ERAS PATHWAY;  Surgeon: Rolm Bookbinder, MD;  Location: [place];  Service: General;  Laterality: Left;  . RADIOACTIVE SEED GUIDED PARTIAL MASTECTOMY WITH AXILLARY SENTINEL LYMPH NODE BIOPSY Left 03/06/2015   Procedure: RADIOACTIVE SEED GUIDED PARTIAL MASTECTOMY WITH AXILLARY SENTINEL LYMPH NODE BIOPSY;  Surgeon: Rolm Bookbinder, MD;  Location: [place];  Service: General;  Laterality: Left;  . REMOVAL OF BILATERAL TISSUE EXPANDERS WITH PLACEMENT OF BILATERAL BREAST IMPLANTS Bilateral 01/06/2018   Procedure: REMOVAL OF BILATERAL TISSUE EXPANDERS WITH PLACEMENT OF BILATERAL BREAST IMPLANTS;  Surgeon: Irene Limbo, MD;  Location: [place];  Service: Plastics;  Laterality: Bilateral;  .  TONSILLECTOMY  1996    There were no vitals filed for this visit.   Subjective Assessment - 10/13/20 1105    Subjective I have used the vitamin E suppositories. I have been massaging the vulvar tissue 1 time per week. Tried the lubricants and having pain that is initial  penetration and deeper. Pain is buring pain.    Patient Stated Goals reduce pain with leakage and improve pain with intercourse    Currently in Pain? Yes    Pain Score 7     Pain Location Vagina    Pain Orientation Mid    Pain Descriptors / Indicators Sharp;Burning    Pain Type Acute pain    Pain Onset More than a month ago    Pain Frequency Intermittent    Aggravating Factors  deep and initial penetration with intercourse    Pain Relieving Factors no intercourse    Pain Score 3    Pain Location Back    Pain Orientation Lower    Pain Descriptors / Indicators Shooting    Pain Type Chronic pain    Pain Onset More than a month ago    Pain Frequency Intermittent    Aggravating Factors  lifting, running    Pain Relieving Factors walking instread of running on painful day                          Pelvic Floor Special Questions - 10/13/20 0001    Pelvic Floor Internal Exam Patient confirms identification and approves Nancy Austin to assess pelvic floor and treatment    Exam Type Vaginal             [facility] Adult Nancy Austin Treatment/Exercise - 10/13/20 0001      Self-Care   Self-Care Other Self-Care Comments    Other Self-Care Comments  educated pateint on using the moiturizer on the vulvar area      Lumbar Exercises: Stretches   Hip Flexor Stretch Right;Left;60 seconds    Hip Flexor Stretch Limitations prone on foam roller    ITB Stretch Right;Left;1 rep;60 seconds    ITB Stretch Limitations on side with foam roller    Other Lumbar Stretch Exercise hip adductor with foam roll 60sec right and left      Manual Therapy   Manual Therapy Soft tissue mobilization;Internal Pelvic Floor    Manual therapy comments educated patient on how to perfrom perineal massage to improve tissue mobiltiy     Soft tissue mobilization bilateral hip adductors to prepare for internal work    Internal Pelvic Floor perineal body and levator ani                  Nancy Austin Education - 10/13/20 1356     Education Details educated patient on foam rolling and perineal massage, went over motiurizing the vulvar area and labia    Person(s) Educated Patient    Methods Explanation;Demonstration;Verbal cues;Handout    Comprehension Verbalized understanding;Returned demonstration            Nancy Austin Short Term Goals - 10/13/20 1359      Nancy Austin SHORT TERM GOAL #1   Title Nancy Austin will be independent with her HEP    Time 4    Period Weeks    Status Achieved    Target Date 10/24/20      Nancy Austin SHORT TERM GOAL #2   Title able to perform pelvic floor manual work to improve tissue mobiity    Baseline just learned  Time 4    Period Weeks    Status On-going      Nancy Austin SHORT TERM GOAL #3   Title understand ways to moisturize the vaginal tissue to improve dryness    Time 4    Period Weeks    Status Achieved      Nancy Austin SHORT TERM GOAL #4   Title understand what lubricants to use for intercourse that are alright for the vaginal mucosa    Time 4    Period Weeks    Status Achieved             Nancy Austin Long Term Goals - 09/26/20 1203      Nancy Austin LONG TERM GOAL #1   Title independent with advanced HEP    Baseline --    Time 10    Period Weeks    Status New    Target Date 12/05/20      Nancy Austin LONG TERM GOAL #2   Title able to have penile penetration with pain level </= 1/10 due to improve mobility and hydration of tissue    Baseline --    Time 12    Period Weeks    Status New    Target Date 12/19/20      Nancy Austin LONG TERM GOAL #3   Title urinary leakage with running and coughing decreased >/= 80% due to improved strength and coordination of the pelvic floor    Baseline --    Time 10    Period Weeks    Status New    Target Date 12/05/20      Nancy Austin LONG TERM GOAL #4   Title lumbar pain decreased >/= 75% while she runs and lift due to reduction of thoracic kyphosis and improved core strength    Time 10    Period Weeks    Status New    Target Date 12/05/20                 Plan - 10/13/20 1114    Clinical  Impression Statement Patient reports she stil feels dry in the vaginal canal and has pain with intercourse. Patient back pain is better today. Patient has tightness in the perineal body and levator ani. Patient was educated on how to perform her own perineal massage to elongate the vaginal muscles. No change in leakage yet. Patient will benefit from skilled therapy to redcue pain and improve pelvic floor coordination to reduce leakage.    Personal Factors and Comorbidities Comorbidity 2;Sex;Fitness    Comorbidities Left breast cancer with radiation and chemotherapy 12/2013; 11/12/2015 laparoscompis salpingo-oopherectomy; Latissimus flap left breast 09/26/2017; Patient on Tamoxifen    Examination-Activity Limitations Continence    Examination-Participation Restrictions Interpersonal Relationship    Stability/Clinical Decision Making Evolving/Moderate complexity    Rehab Potential Excellent    Nancy Austin Frequency 1x / week    Nancy Austin Duration Other (comment)   10 weeks   Nancy Austin Treatment/Interventions ADLs/Self Care Home Management;Cryotherapy;Moist Heat;Electrical Stimulation;Therapeutic activities;Therapeutic exercise;Neuromuscular re-education;Manual techniques;Patient/family education;Dry needling;Spinal Manipulations    Nancy Austin Next Visit Plan manual work on pelvic floor, work on bulging pelvic floor and contraction; see how her self massage is going    Nancy Austin Home Exercise Plan Access Code: [email or web address]    Consulted and Agree with Plan of Care Patient           Patient will benefit from skilled therapeutic intervention in order to improve the following deficits and impairments:  Decreased coordination, Decreased range of motion, Increased fascial restricitons, Pain, Decreased strength,  Decreased activity tolerance, Decreased endurance, Increased muscle spasms  Visit Diagnosis: Muscle weakness (generalized)  Other lack of coordination  Cramp and spasm  Chronic midline low back pain without sciatica     Problem  List Patient Active Problem List   Diagnosis Date Noted  . Leukopenia 08/13/2019  . Iron deficiency anemia 12/20/2018  . Vitamin D deficiency 05/01/2018  . Absence of breast, acquired, bilateral 10/26/2017  . Ductal carcinoma in situ (DCIS) of left breast 09/26/2017  . Physical exam 03/28/2017  . Low back pain at multiple sites 05/26/2016  . Genetic testing 10/17/2015  . Chemotherapy induced neutropenia ([place]) 02/03/2015  . Neuropathy due to chemotherapeutic drug ([place]) 01/13/2015  . Constipation 12/09/2014  . Hot flashes 12/09/2014  . Spotting 11/20/2014  . Vasovagal near syncope 11/16/2014  . Malignant neoplasm of upper-outer quadrant of left breast in female, estrogen receptor positive ([place]) 09/24/2014    Nancy Austin, Nancy Austin 10/13/20 2:01 PM   [facility] [address], [place], [place], [address] Phone: [phone]   Fax:  [phone]  Name: Nancy Austin MRN: [id number] Date of Birth: December 22, 1975

## 2020-10-20 ENCOUNTER — Encounter: Payer: Self-pay | Admitting: Physical Therapy

## 2020-10-20 ENCOUNTER — Other Ambulatory Visit: Payer: Self-pay

## 2020-10-20 ENCOUNTER — Ambulatory Visit: Payer: 59 | Admitting: Physical Therapy

## 2020-10-20 DIAGNOSIS — M6281 Muscle weakness (generalized): Secondary | ICD-10-CM | POA: Diagnosis not present

## 2020-10-20 DIAGNOSIS — M545 Low back pain, unspecified: Secondary | ICD-10-CM

## 2020-10-20 DIAGNOSIS — G8929 Other chronic pain: Secondary | ICD-10-CM

## 2020-10-20 DIAGNOSIS — R252 Cramp and spasm: Secondary | ICD-10-CM

## 2020-10-20 DIAGNOSIS — R278 Other lack of coordination: Secondary | ICD-10-CM | POA: Diagnosis not present

## 2020-10-20 NOTE — Therapy (Signed)
[facility] [address] [place], [place], [address] Phone: [phone]   Fax:  [phone]  Physical Therapy Treatment  Patient Details  Name: Nancy Austin MRN: [id number] Date of Birth: 18-Sep-1976 No data recorded  Encounter Date: 10/20/2020   PT End of Session - 10/20/20 1107    Visit Number 4    Date for PT Re-Evaluation 12/05/20    Authorization Type UMR    PT Start Time 1100    PT Stop Time 1140    PT Time Calculation (min) 40 min    Activity Tolerance Patient tolerated treatment well    Behavior During Therapy [facility] for tasks assessed/performed           Past Medical History:  Diagnosis Date   Allergy    Anemia    Benign skin lesion of forehead 10/2015   Breast cancer ([place])    Cancer ([place])    History of breast cancer 09/2014   left   History of chemotherapy    finished 02/10/2015   History of radiation therapy 04/16/2015 - 06/02/2015   Personal history of chemotherapy    Personal history of radiation therapy     Past Surgical History:  Procedure Laterality Date   AUGMENTATION MAMMAPLASTY Bilateral    BREAST LUMPECTOMY     BREAST RECONSTRUCTION WITH PLACEMENT OF TISSUE EXPANDER AND FLEX HD (ACELLULAR HYDRATED DERMIS) Right 09/26/2017   Procedure: RIGHT BREAST RECONSTRUCTION WITH PLACEMENT OF TISSUE EXPANDER AND FLEX HD (ACELLULAR HYDRATED DERMIS);  Surgeon: Irene Limbo, MD;  Location: [place];  Service: Plastics;  Laterality: Right;   LAPAROSCOPIC OOPHERECTOMY Bilateral    LAPAROSCOPIC SALPINGO OOPHERECTOMY Bilateral 11/12/2015   Procedure: LAPAROSCOPIC BILATERAL SALPINGO OOPHORECTOMY;  Surgeon: Paula Compton, MD;  Location: [facility];  Service: Gynecology;  Laterality: Bilateral;   LATISSIMUS FLAP TO BREAST Left 09/26/2017   Procedure: LATISSIMUS FLAP TO LEFT BREAST;  Surgeon: Irene Limbo, MD;  Location: [place];  Service: Plastics;  Laterality: Left;   LESION REMOVAL Left  10/22/2015   Procedure: EXCISION OF FOREHEAD LESION 1X1CM;  Surgeon: Rolm Bookbinder, MD;  Location: [place];  Service: General;  Laterality: Left;   MASTECTOMY Bilateral    NIPPLE SPARING MASTECTOMY Bilateral 09/26/2017   Procedure: LEFT NIPPLE SPARING MASTECTOMY, RIGHT PROPHYLACTIC NIPPLE SPARING MASTECTOMY;  Surgeon: Rolm Bookbinder, MD;  Location: [place];  Service: General;  Laterality: Bilateral;   PORT-A-CATH REMOVAL N/A 10/22/2015   Procedure: REMOVAL PORT-A-CATH;  Surgeon: Rolm Bookbinder, MD;  Location: [place];  Service: General;  Laterality: N/A;   PORTACATH PLACEMENT N/A 10/15/2014   Procedure: INSERTION PORT-A-CATH;  Surgeon: Rolm Bookbinder, MD;  Location: [facility];  Service: General;  Laterality: N/A;   RADIOACTIVE SEED GUIDED EXCISIONAL BREAST BIOPSY Left 08/17/2017   Procedure: LEFT RADIOACTIVE SEED GUIDED EXCISIONAL BREAST BIOPSY ERAS PATHWAY;  Surgeon: Rolm Bookbinder, MD;  Location: [place];  Service: General;  Laterality: Left;   RADIOACTIVE SEED GUIDED PARTIAL MASTECTOMY WITH AXILLARY SENTINEL LYMPH NODE BIOPSY Left 03/06/2015   Procedure: RADIOACTIVE SEED GUIDED PARTIAL MASTECTOMY WITH AXILLARY SENTINEL LYMPH NODE BIOPSY;  Surgeon: Rolm Bookbinder, MD;  Location: [place];  Service: General;  Laterality: Left;   REMOVAL OF BILATERAL TISSUE EXPANDERS WITH PLACEMENT OF BILATERAL BREAST IMPLANTS Bilateral 01/06/2018   Procedure: REMOVAL OF BILATERAL TISSUE EXPANDERS WITH PLACEMENT OF BILATERAL BREAST IMPLANTS;  Surgeon: Irene Limbo, MD;  Location: [place];  Service: Plastics;  Laterality: Bilateral;  TONSILLECTOMY  1996    There were no vitals filed for this visit.   Subjective Assessment - 10/20/20 1105    Subjective Things are good. I am keeping up with my exercise.    Patient Stated Goals reduce pain with leakage and improve pain with intercourse    Currently in  Pain? Yes    Pain Score 7     Pain Location Vagina    Pain Orientation Mid    Pain Descriptors / Indicators Burning;Sharp    Pain Type Acute pain    Pain Onset More than a month ago    Pain Frequency Intermittent    Aggravating Factors  deep and initial penetration with intercourse    Pain Relieving Factors no intercourse    Multiple Pain Sites No                          Pelvic Floor Special Questions - 10/20/20 0001    Pelvic Floor Internal Exam Patient confirms identification and approves PT to assess pelvic floor and treatment    Exam Type Vaginal    Palpation tenderness located in the left pelvic flooor    Strength fair squeeze, definite lift             [facility] Adult PT Treatment/Exercise - 10/20/20 0001      Neuro Re-ed    Neuro Re-ed Details  pelvic floor contraction with verbal and tactile cues to fully relax the pelvic floor      Lumbar Exercises: Stretches   Active Hamstring Stretch Right;Left;1 rep;30 seconds    Active Hamstring Stretch Limitations supine with strap    ITB Stretch Right;Left;1 rep;30 seconds    ITB Stretch Limitations supine with strap    Other Lumbar Stretch Exercise supine hip adductor stretch with strap in supine 60 sec each side    Other Lumbar Stretch Exercise butter fly  stretch in sitting holding for 60 sec.       Lumbar Exercises: Quadruped   Madcat/Old Horse 10 reps    Madcat/Old Horse Limitations fels pulling in the abdomen      Shoulder Exercises: Prone   Extension Strengthening;Both;20 reps;Weights    Extension Weight (lbs) 2    Horizontal ABduction 1 Strengthening;Both;10 reps      Manual Therapy   Manual Therapy Internal Pelvic Floor    Internal Pelvic Floor laying on left side performing soft tissue work to the left levator ani, perineal body, right levator ani                  PT Education - 10/20/20 1145    Education Details Access Code: [email or web address]    Person(s) Educated Patient    Methods  Explanation;Demonstration;Verbal cues;Handout    Comprehension Returned demonstration;Verbalized understanding            PT Short Term Goals - 10/20/20 1329      PT SHORT TERM GOAL #1   Title Pt will be independent with her HEP    Target Date 10/24/20      PT SHORT TERM GOAL #2   Title able to perform pelvic floor manual work to improve tissue mobiity    Time 4    Period Weeks    Status Achieved      PT SHORT TERM GOAL #3   Title understand ways to moisturize the vaginal tissue to improve dryness    Time 4    Period Weeks    Status Achieved  PT SHORT TERM GOAL #4   Title understand what lubricants to use for intercourse that are alright for the vaginal mucosa    Time 4    Period Weeks    Status Achieved             PT Long Term Goals - 10/20/20 1330      PT LONG TERM GOAL #1   Title independent with advanced HEP    Period Weeks    Status Achieved      PT LONG TERM GOAL #2   Title able to have penile penetration with pain level </= 1/10 due to improve mobility and hydration of tissue    Time 12    Period Weeks    Status On-going      PT LONG TERM GOAL #3   Time 10    Period Weeks    Status On-going      PT LONG TERM GOAL #4   Title lumbar pain decreased >/= 75% while she runs and lift due to reduction of thoracic kyphosis and improved core strength    Time 10    Period Weeks    Status On-going                 Plan - 10/20/20 1119    Clinical Impression Statement Pelvic floor strength increased to 3/5. Patient continues to have tightness in the perineal body. Patient has difficulty with fully relaxing the pelvic floor. Patient has learned strengthening exercises for the back to reduce her thoracici kyphosis. Patietn will bneefit from skilled therapy to reduce pain and improve pelvic floor coordination to reduce leakage.    Personal Factors and Comorbidities Comorbidity 2;Sex;Fitness    Comorbidities Left breast cancer with radiation and  chemotherapy 12/2013; 11/12/2015 laparoscompis salpingo-oopherectomy; Latissimus flap left breast 09/26/2017; Patient on Tamoxifen    Examination-Activity Limitations Continence    Examination-Participation Restrictions Interpersonal Relationship    Stability/Clinical Decision Making Evolving/Moderate complexity    Rehab Potential Excellent    PT Frequency 1x / week    PT Duration Other (comment)   10 weeks   PT Treatment/Interventions ADLs/Self Care Home Management;Cryotherapy;Moist Heat;Electrical Stimulation;Therapeutic activities;Therapeutic exercise;Neuromuscular re-education;Manual techniques;Patient/family education;Dry needling;Spinal Manipulations    PT Next Visit Plan manual work on pelvic floor, work on bulging pelvic floor and contraction; see how her self massage is going    PT Home Exercise Plan Access Code: [email or web address]    Consulted and Agree with Plan of Care Patient           Patient will benefit from skilled therapeutic intervention in order to improve the following deficits and impairments:  Decreased coordination, Decreased range of motion, Increased fascial restricitons, Pain, Decreased strength, Decreased activity tolerance, Decreased endurance, Increased muscle spasms  Visit Diagnosis: Muscle weakness (generalized)  Other lack of coordination  Cramp and spasm  Chronic midline low back pain without sciatica     Problem List Patient Active Problem List   Diagnosis Date Noted   Leukopenia 08/13/2019   Iron deficiency anemia 12/20/2018   Vitamin D deficiency 05/01/2018   Absence of breast, acquired, bilateral 10/26/2017   Ductal carcinoma in situ (DCIS) of left breast 09/26/2017   Physical exam 03/28/2017   Low back pain at multiple sites 05/26/2016   Genetic testing 10/17/2015   Chemotherapy induced neutropenia ([place]) 02/03/2015   Neuropathy due to chemotherapeutic drug ([place]) 01/13/2015   Constipation 12/09/2014   Hot flashes 12/09/2014    Spotting 11/20/2014   Vasovagal near syncope 11/16/2014  Malignant neoplasm of upper-outer quadrant of left breast in female, estrogen receptor positive ([place]) 09/24/2014    Earlie Counts, PT 10/20/20 1:57 PM   [facility] [address], [place], [place], [address] Phone: [phone]   Fax:  [phone]  Name: Nancy Austin MRN: [id number] Date of Birth: 1976/02/28

## 2020-10-20 NOTE — Patient Instructions (Signed)
Access Code: [email or web address] URL: [email or web address] Date: 10/20/2020 Prepared by: Earlie Counts  Exercises Prone Shoulder Extension Palm Down with Dumbbell - 1 x daily - 3 x weekly - 2 sets - 10 reps Prone Shoulder External Rotation - 1 x daily - 3 x weekly - 2 sets - 10 reps Prone Shoulder Horizontal Abduction with Thumbs Up - 1 x daily - 7 x weekly - 2 sets - 10 reps Sidelying Pelvic Floor Contraction with Self-Palpation - 1 x daily - 7 x weekly - 1 sets - 10 reps - 5 sec hold [facility] [address], [place], [place] [address] Phone # [phone] Fax [phone]

## 2020-11-17 ENCOUNTER — Encounter: Payer: Self-pay | Admitting: Physical Therapy

## 2020-11-17 ENCOUNTER — Ambulatory Visit: Payer: 59 | Attending: Oncology | Admitting: Physical Therapy

## 2020-11-17 ENCOUNTER — Other Ambulatory Visit: Payer: Self-pay

## 2020-11-17 DIAGNOSIS — R278 Other lack of coordination: Secondary | ICD-10-CM | POA: Insufficient documentation

## 2020-11-17 DIAGNOSIS — M545 Low back pain, unspecified: Secondary | ICD-10-CM | POA: Insufficient documentation

## 2020-11-17 DIAGNOSIS — R252 Cramp and spasm: Secondary | ICD-10-CM | POA: Insufficient documentation

## 2020-11-17 DIAGNOSIS — G8929 Other chronic pain: Secondary | ICD-10-CM | POA: Insufficient documentation

## 2020-11-17 DIAGNOSIS — M6281 Muscle weakness (generalized): Secondary | ICD-10-CM | POA: Diagnosis not present

## 2020-11-17 NOTE — Patient Instructions (Signed)
Pelvic Floor Vaginal dilators                                                             Amielle Restore Vaginal Dilator Kit                                                        Vulva Tech                                                                              Restore                                                                                                                   Soul Source                                                                                    Intimate Rose                                                                                        Inspire Silicone Dilator Set Size 7, size 8                                                                                V Well  dilator set  Milli Dilator that you pump                                                                               Berman dilator                                                                                             [place] dilators                                                                  Vaginismus Vaginal dilators                                                    Oh Nut for deep vaginal penetration limitation; cmtmedical, soulsource  Most of these dilators you can get on [employer]. The ones you are not able to do then look at the company website or [email or web address]

## 2020-11-17 NOTE — Therapy (Signed)
[facility] [address] [place], [place], [address] Phone: [phone]   Fax:  [phone]  Physical Therapy Treatment  Patient Details  Name: Nancy Austin MRN: [id number] Date of Birth: 15-Jun-1976 Referring Provider (PT): Dr. Lurline Del   Encounter Date: 11/17/2020   PT End of Session - 11/17/20 0935    Visit Number 5    Date for PT Re-Evaluation 12/05/20    Authorization Type UMR    PT Start Time 0930    PT Stop Time 1015    PT Time Calculation (min) 45 min    Activity Tolerance Patient tolerated treatment well    Behavior During Therapy [facility] for tasks assessed/performed           Past Medical History:  Diagnosis Date  . Allergy   . Anemia   . Benign skin lesion of forehead 10/2015  . Breast cancer ([place])   . Cancer ([place])   . History of breast cancer 09/2014   left  . History of chemotherapy    finished 02/10/2015  . History of radiation therapy 04/16/2015 - 06/02/2015  . Personal history of chemotherapy   . Personal history of radiation therapy     Past Surgical History:  Procedure Laterality Date  . AUGMENTATION MAMMAPLASTY Bilateral   . BREAST LUMPECTOMY    . BREAST RECONSTRUCTION WITH PLACEMENT OF TISSUE EXPANDER AND FLEX HD (ACELLULAR HYDRATED DERMIS) Right 09/26/2017   Procedure: RIGHT BREAST RECONSTRUCTION WITH PLACEMENT OF TISSUE EXPANDER AND FLEX HD (ACELLULAR HYDRATED DERMIS);  Surgeon: Irene Limbo, MD;  Location: [place];  Service: Plastics;  Laterality: Right;  . LAPAROSCOPIC OOPHERECTOMY Bilateral   . LAPAROSCOPIC SALPINGO OOPHERECTOMY Bilateral 11/12/2015   Procedure: LAPAROSCOPIC BILATERAL SALPINGO OOPHORECTOMY;  Surgeon: Paula Compton, MD;  Location: [facility];  Service: Gynecology;  Laterality: Bilateral;  . LATISSIMUS FLAP TO BREAST Left 09/26/2017   Procedure: LATISSIMUS FLAP TO LEFT BREAST;  Surgeon: Irene Limbo, MD;  Location: [place];  Service: Plastics;  Laterality:  Left;  . LESION REMOVAL Left 10/22/2015   Procedure: EXCISION OF FOREHEAD LESION 1X1CM;  Surgeon: Rolm Bookbinder, MD;  Location: [place];  Service: General;  Laterality: Left;  Marland Kitchen MASTECTOMY Bilateral   . NIPPLE SPARING MASTECTOMY Bilateral 09/26/2017   Procedure: LEFT NIPPLE SPARING MASTECTOMY, RIGHT PROPHYLACTIC NIPPLE SPARING MASTECTOMY;  Surgeon: Rolm Bookbinder, MD;  Location: [place];  Service: General;  Laterality: Bilateral;  . PORT-A-CATH REMOVAL N/A 10/22/2015   Procedure: REMOVAL PORT-A-CATH;  Surgeon: Rolm Bookbinder, MD;  Location: [place];  Service: General;  Laterality: N/A;  . PORTACATH PLACEMENT N/A 10/15/2014   Procedure: INSERTION PORT-A-CATH;  Surgeon: Rolm Bookbinder, MD;  Location: [facility];  Service: General;  Laterality: N/A;  . RADIOACTIVE SEED GUIDED EXCISIONAL BREAST BIOPSY Left 08/17/2017   Procedure: LEFT RADIOACTIVE SEED GUIDED EXCISIONAL BREAST BIOPSY ERAS PATHWAY;  Surgeon: Rolm Bookbinder, MD;  Location: [place];  Service: General;  Laterality: Left;  . RADIOACTIVE SEED GUIDED PARTIAL MASTECTOMY WITH AXILLARY SENTINEL LYMPH NODE BIOPSY Left 03/06/2015   Procedure: RADIOACTIVE SEED GUIDED PARTIAL MASTECTOMY WITH AXILLARY SENTINEL LYMPH NODE BIOPSY;  Surgeon: Rolm Bookbinder, MD;  Location: [place];  Service: General;  Laterality: Left;  . REMOVAL OF BILATERAL TISSUE EXPANDERS WITH PLACEMENT OF BILATERAL BREAST IMPLANTS Bilateral 01/06/2018   Procedure: REMOVAL OF BILATERAL TISSUE EXPANDERS WITH PLACEMENT OF BILATERAL BREAST IMPLANTS;  Surgeon: Irene Limbo, MD;  Location: [place];  Service: Plastics;  Laterality: Bilateral;  . TONSILLECTOMY  1996    There were no vitals filed for this visit.   Subjective Assessment - 11/17/20 0931    Subjective I am still having pain vaginally. I am using the vitamin E suppositories. I am using the coconut oil. Urinary leakage is a  little better. I have been having bleeding the last several times with intercourse.    Patient Stated Goals reduce pain with leakage and improve pain with intercourse    Currently in Pain? Yes    Pain Score 6     Pain Location Vagina    Pain Orientation Mid    Pain Descriptors / Indicators Burning;Sharp    Pain Type Acute pain    Pain Onset More than a month ago    Aggravating Factors  deep and initial penetration with intercourse    Pain Relieving Factors no intercourse              [facility] PT Assessment - 11/17/20 0001      Assessment   Medical Diagnosis C50.412, Z17.0 Malignant neoplasm of upper-outer quadrant of left breast in female, estrogen receptor postive; D05.12 Ductal Carcinoma in situ (DCIS) of left breast    Referring Provider (PT) Dr. Lurline Del    Prior Therapy none      Precautions   Precautions Other (comment)    Precaution Comments breast cancer with radiation and chemotherapy      Restrictions   Weight Bearing Restrictions No                      Pelvic Floor Special Questions - 11/17/20 0001    Pelvic Floor Internal Exam Patient confirms identification and approves PT to assess pelvic floor and treatment    Exam Type Vaginal    Palpation tightness in the perineal body, along the sides of the  introitus, sides of bladder    Strength fair squeeze, definite lift    Strength # of reps 3    Strength # of seconds 3             [facility] Adult PT Treatment/Exercise - 11/17/20 0001      Self-Care   Self-Care Other Self-Care Comments    Other Self-Care Comments  educated pateint on using the moiturizer on the vulvar area      Neuro Re-ed    Neuro Re-ed Details  pelvic floor contraction with verbal and tactile cues to fully relax the pelvic floor      Manual Therapy   Manual Therapy Internal Pelvic Floor    Internal Pelvic Floor laying on left sidely working on the perineal body, urethra sphincter, bulbocavernosus, levator ani                   PT Education - 11/17/20 1035    Education Details education on vaginal dilators, ohnut, and urge to void    Person(s) Educated Patient    Methods Explanation;Handout;Demonstration    Comprehension Verbalized understanding;Returned demonstration            PT Short Term Goals - 10/20/20 1329      PT SHORT TERM GOAL #1   Title Pt will be independent with her HEP    Target Date 10/24/20      PT SHORT TERM GOAL #2   Title able to perform pelvic floor manual work to improve tissue mobiity    Time 4    Period Weeks    Status Achieved  PT SHORT TERM GOAL #3   Title understand ways to moisturize the vaginal tissue to improve dryness    Time 4    Period Weeks    Status Achieved      PT SHORT TERM GOAL #4   Title understand what lubricants to use for intercourse that are alright for the vaginal mucosa    Time 4    Period Weeks    Status Achieved             PT Long Term Goals - 11/17/20 1045      PT LONG TERM GOAL #1   Title independent with advanced HEP    Status On-going      PT LONG TERM GOAL #2   Title able to have penile penetration with pain level </= 1/10 due to improve mobility and hydration of tissue    Baseline pain level 6/10    Time 12    Status On-going      PT LONG TERM GOAL #3   Title urinary leakage with running and coughing decreased >/= 80% due to improved strength and coordination of the pelvic floor    Baseline leaks on the way to the urine    Time 10    Period Weeks    Status On-going      PT LONG TERM GOAL #4   Title lumbar pain decreased >/= 75% while she runs and lift due to reduction of thoracic kyphosis and improved core strength    Time 10    Period Weeks    Status On-going                 Plan - 11/17/20 1036    Clinical Impression Statement Patient has urinary leakage with the urge to void and walking to the toilet. Patient has pain with deep penile penetration. She is using the vitamin E internally  and coconut oil externally. Patient vaginal tissue is a healthy pink color. Patient was educated on ways to expand the vaginal tissue and using a ohnut to reduce the deep penetration. Patient will benefit from skilled therapy to reduce pain and improve pelvic floor coordination to reduce leakage.    Personal Factors and Comorbidities Comorbidity 2;Sex;Fitness    Comorbidities Left breast cancer with radiation and chemotherapy 12/2013; 11/12/2015 laparoscompis salpingo-oopherectomy; Latissimus flap left breast 09/26/2017; Patient on Tamoxifen    Examination-Activity Limitations Continence    Examination-Participation Restrictions Interpersonal Relationship    Stability/Clinical Decision Making Evolving/Moderate complexity    Rehab Potential Excellent    PT Frequency 1x / week    PT Duration Other (comment)   10 weeks   PT Treatment/Interventions ADLs/Self Care Home Management;Cryotherapy;Moist Heat;Electrical Stimulation;Therapeutic activities;Therapeutic exercise;Neuromuscular re-education;Manual techniques;Patient/family education;Dry needling;Spinal Manipulations    PT Next Visit Plan manual work on pelvic floor, work on bulging pelvic floor and contraction; go over the urge to void, pelvic floor contraction in sitting, standing with hip hike pelvic floor contraction, check lumbar ROM and pelvic alitnemnt    PT Home Exercise Plan Access Code: [email or web address]    Consulted and Agree with Plan of Care Patient           Patient will benefit from skilled therapeutic intervention in order to improve the following deficits and impairments:  Decreased coordination, Decreased range of motion, Increased fascial restricitons, Pain, Decreased strength, Decreased activity tolerance, Decreased endurance, Increased muscle spasms  Visit Diagnosis: Muscle weakness (generalized)  Other lack of coordination  Cramp and spasm  Chronic midline low back pain  without sciatica     Problem List Patient Active  Problem List   Diagnosis Date Noted  . Leukopenia 08/13/2019  . Iron deficiency anemia 12/20/2018  . Vitamin D deficiency 05/01/2018  . Absence of breast, acquired, bilateral 10/26/2017  . Ductal carcinoma in situ (DCIS) of left breast 09/26/2017  . Physical exam 03/28/2017  . Low back pain at multiple sites 05/26/2016  . Genetic testing 10/17/2015  . Chemotherapy induced neutropenia ([place]) 02/03/2015  . Neuropathy due to chemotherapeutic drug ([place]) 01/13/2015  . Constipation 12/09/2014  . Hot flashes 12/09/2014  . Spotting 11/20/2014  . Vasovagal near syncope 11/16/2014  . Malignant neoplasm of upper-outer quadrant of left breast in female, estrogen receptor positive ([place]) 09/24/2014    Earlie Counts, PT 11/17/20 10:47 AM   [facility] [address] [place], [place], [address] Phone: [phone]   Fax:  [phone]  Name: Nancy Austin MRN: [id number] Date of Birth: Sep 05, 1976

## 2020-11-28 ENCOUNTER — Ambulatory Visit: Payer: 59 | Admitting: Physical Therapy

## 2020-12-10 ENCOUNTER — Encounter: Payer: Self-pay | Admitting: Physical Therapy

## 2020-12-10 ENCOUNTER — Ambulatory Visit: Payer: 59 | Admitting: Physical Therapy

## 2020-12-10 ENCOUNTER — Other Ambulatory Visit: Payer: Self-pay

## 2020-12-10 DIAGNOSIS — R252 Cramp and spasm: Secondary | ICD-10-CM

## 2020-12-10 DIAGNOSIS — M545 Low back pain, unspecified: Secondary | ICD-10-CM | POA: Diagnosis not present

## 2020-12-10 DIAGNOSIS — G8929 Other chronic pain: Secondary | ICD-10-CM

## 2020-12-10 DIAGNOSIS — M6281 Muscle weakness (generalized): Secondary | ICD-10-CM

## 2020-12-10 DIAGNOSIS — R278 Other lack of coordination: Secondary | ICD-10-CM

## 2020-12-10 NOTE — Therapy (Signed)
[facility] [address], [address] [place], [place], [address] Phone: [phone]   Fax:  [phone]  Physical Therapy Treatment  Patient Details  Name: Nancy Austin MRN: [id number] Date of Birth: Mar 03, 1976 Referring Provider (PT): Dr. Ruthann Cancer   Encounter Date: 12/10/2020   PT End of Session - 12/10/20 1254    Visit Number 6    Date for PT Re-Evaluation 03/04/21    Authorization Type UMR    PT Start Time 1230    PT Stop Time 1314    PT Time Calculation (min) 44 min    Activity Tolerance Patient tolerated treatment well    Behavior During Therapy [facility] for tasks assessed/performed           Past Medical History:  Diagnosis Date   Allergy    Anemia    Benign skin lesion of forehead 10/2015   Breast cancer ([facility])    Cancer ([facility])    History of breast cancer 09/2014   left   History of chemotherapy    finished 02/10/2015   History of radiation therapy 04/16/2015 - 06/02/2015   Personal history of chemotherapy    Personal history of radiation therapy     Past Surgical History:  Procedure Laterality Date   AUGMENTATION MAMMAPLASTY Bilateral    BREAST LUMPECTOMY     BREAST RECONSTRUCTION WITH PLACEMENT OF TISSUE EXPANDER AND FLEX HD (ACELLULAR HYDRATED DERMIS) Right 09/26/2017   Procedure: RIGHT BREAST RECONSTRUCTION WITH PLACEMENT OF TISSUE EXPANDER AND FLEX HD (ACELLULAR HYDRATED DERMIS);  Surgeon: Glenna Fellows, MD;  Location[facility];  Service: Plastics;  Laterality: Right;   LAPAROSCOPIC OOPHERECTOMY Bilateral    LAPAROSCOPIC SALPINGO OOPHERECTOMY Bilateral 11/12/2015   Procedure: LAPAROSCOPIC BILATERAL SALPINGO OOPHORECTOMY;  Surgeon: Huel Cote, MD;  Location: [facility];  Service: Gynecology;  Laterality: Bilateral;   LATISSIMUS FLAP TO BREAST Left 09/26/2017   Procedure: LATISSIMUS FLAP TO LEFT BREAST;  Surgeon: Glenna Fellows, MD;  Location: [facility];  Service: Plastics;  Laterality:  Left;   LESION REMOVAL Left 10/22/2015   Procedure: EXCISION OF FOREHEAD LESION 1X1CM;  Surgeon: Emelia Loron, MD;  Location: [facility];  Service: General;  Laterality: Left;   MASTECTOMY Bilateral    NIPPLE SPARING MASTECTOMY Bilateral 09/26/2017   Procedure: LEFT NIPPLE SPARING MASTECTOMY, RIGHT PROPHYLACTIC NIPPLE SPARING MASTECTOMY;  Surgeon: Emelia Loron, MD;  Location[facility];  Service: General;  Laterality: Bilateral;   PORT-A-CATH REMOVAL N/A 10/22/2015   Procedure: REMOVAL PORT-A-CATH;  Surgeon: Emelia Loron, MD;  Location: [facility];  Service: General;  Laterality: N/A;   PORTACATH PLACEMENT N/A 10/15/2014   Procedure: INSERTION PORT-A-CATH;  Surgeon: Emelia Loron, MD;  Location: [facility];  Service: General;  Laterality: N/A;   RADIOACTIVE SEED GUIDED EXCISIONAL BREAST BIOPSY Left 08/17/2017   Procedure: LEFT RADIOACTIVE SEED GUIDED EXCISIONAL BREAST BIOPSY ERAS PATHWAY;  Surgeon: Emelia Loron, MD;  Location: [facility];  Service: General;  Laterality: Left;   RADIOACTIVE SEED GUIDED PARTIAL MASTECTOMY WITH AXILLARY SENTINEL LYMPH NODE BIOPSY Left 03/06/2015   Procedure: RADIOACTIVE SEED GUIDED PARTIAL MASTECTOMY WITH AXILLARY SENTINEL LYMPH NODE BIOPSY;  Surgeon: Emelia Loron, MD;  Location: [facility];  Service: General;  Laterality: Left;   REMOVAL OF BILATERAL TISSUE EXPANDERS WITH PLACEMENT OF BILATERAL BREAST IMPLANTS Bilateral 01/06/2018   Procedure: REMOVAL OF BILATERAL TISSUE EXPANDERS WITH PLACEMENT OF BILATERAL BREAST IMPLANTS;  Surgeon: Glenna Fellows, MD;  Location: [facility];  Service: Plastics;  Laterality: Bilateral;   TONSILLECTOMY  1996    There were no vitals filed for this visit.   Subjective Assessment - 12/10/20 1234    Subjective Patient is able to get to the commode 90% of the time when have the urge to void. I did not get the Ohnut due to pain is at  the initial penetration.    Patient Stated Goals reduce pain with leakage and improve pain with intercourse    Currently in Pain? Yes    Pain Score 2     Pain Location Vagina    Pain Orientation Mid    Pain Descriptors / Indicators Burning;Sharp    Pain Type Acute pain    Pain Onset More than a month ago    Pain Frequency Intermittent    Aggravating Factors  initial penetration with intercourse    Pain Relieving Factors no intercourse              [facility] PT Assessment - 12/10/20 0001      Assessment   Medical Diagnosis C50.412, Z17.0 Malignant neoplasm of upper-outer quadrant of left breast in female, estrogen receptor postive; D05.12 Ductal Carcinoma in situ (DCIS) of left breast    Referring Provider (PT) Dr. Ruthann Cancer    Prior Therapy none      Precautions   Precautions Other (comment)    Precaution Comments breast cancer with radiation and chemotherapy      Restrictions   Weight Bearing Restrictions No      Cognition   Overall Cognitive Status Within Functional Limits for tasks assessed      Posture/Postural Control   Posture/Postural Control Postural limitations    Postural Limitations Increased thoracic kyphosis      ROM / Strength   AROM / PROM / Strength AROM;PROM;Strength      Strength   Right Hip ABduction 4/5    Right Hip ADduction 4/5    Left Hip External Rotation 4/5    Left Hip ADduction 4/5      Palpation   SI assessment  left ilium post. rotated                      Pelvic Floor Special Questions - 12/10/20 0001    Activities that cause leaking Running;Sneezing;With strong urge    Pelvic Floor Internal Exam Patient confirms identification and approves PT to assess pelvic floor and treatment    Exam Type Vaginal    Strength fair squeeze, definite lift             [facility] Adult PT Treatment/Exercise - 12/10/20 0001      Self-Care   Self-Care Other Self-Care Comments    Other Self-Care Comments  educated patient on using  Desert harvest reveleum on the post. introitus to assist  with pain on initial penetratioin      Neuro Re-ed    Neuro Re-ed Details  pelvic floor drop in sidely with breathing and not forcing the pelvic floor to push open instead to go with her breath      Lumbar Exercises: Stretches   Hip Flexor Stretch Left;Right;1 rep;60 seconds    Hip Flexor Stretch Limitations prone foam roll    ITB Stretch Right;Left;1 rep;60 seconds    ITB Stretch Limitations sidely on the foam roll      Manual Therapy   Manual Therapy Joint mobilization;Muscle Energy Technique;Internal Pelvic Floor    Joint Mobilization mobilization of the sacrum    Internal Pelvic Floor laying  on left side working on the perineal body, puborectalis, pubococcygeus an diliooccygeus    Muscle Energy Technique correct left ilium                  PT Education - 12/10/20 1321    Education Details using desert harvest relveleum on the post. introitus    Person(s) Educated Patient    Methods Explanation    Comprehension Verbalized understanding            PT Short Term Goals - 12/10/20 1326      PT SHORT TERM GOAL #1   Title Pt will be independent with her HEP    Time 4    Period Weeks    Status Achieved      PT SHORT TERM GOAL #2   Title able to perform pelvic floor manual work to improve tissue mobiity    Time 4    Period Weeks    Status Achieved      PT SHORT TERM GOAL #3   Title understand ways to moisturize the vaginal tissue to improve dryness    Time 4    Period Weeks    Status Achieved      PT SHORT TERM GOAL #4   Title understand what lubricants to use for intercourse that are alright for the vaginal mucosa    Time 4    Period Weeks    Status Achieved             PT Long Term Goals - 12/10/20 1327      PT LONG TERM GOAL #1   Title independent with advanced HEP    Time 10    Status On-going      PT LONG TERM GOAL #2   Title able to have penile penetration with pain level </= 1/10  due to improve mobility and hydration of tissue    Baseline pain level 2/10    Time 12    Period Weeks    Status On-going      PT LONG TERM GOAL #3   Title urinary leakage with running and coughing decreased >/= 80% due to improved strength and coordination of the pelvic floor    Baseline leaks on the way to the urine    Time 10    Period Weeks    Status On-going      PT LONG TERM GOAL #4   Title lumbar pain decreased >/= 75% while she runs and lift due to reduction of thoracic kyphosis and improved core strength    Time 10    Period Weeks    Status On-going                 Plan - 12/10/20 1321    Clinical Impression Statement Patient has pain on the initial penetration. She has less pain if there is more foreplay to relax the pelvic floor. Patient has tightness in the perineal body, bil. pubococcygeus and iliococcygeus. Pelvic floor strength is 3/5. Patient has weakness in the hip abductor and adductor. Patient needs tactile cues to elongate the pelvic floor with breath instead of force. Patient is not leaking with running. She will leak other times but that is better. Patient will benefit from skilled therapy to reduce pain with penile penetration and improve pelvic floor coordination to reduce leakage.    Personal Factors and Comorbidities Comorbidity 2;Sex;Fitness    Comorbidities Left breast cancer with radiation and chemotherapy 12/2013; 11/12/2015 laparoscompis salpingo-oopherectomy; Latissimus flap left breast  09/26/2017; Patient on Tamoxifen    Examination-Activity Limitations Continence    Examination-Participation Restrictions Interpersonal Relationship    Stability/Clinical Decision Making Evolving/Moderate complexity    Rehab Potential Excellent    PT Frequency 1x / week    PT Duration Other (comment)   10 weeks   PT Treatment/Interventions ADLs/Self Care Home Management;Cryotherapy;Moist Heat;Electrical Stimulation;Therapeutic activities;Therapeutic  exercise;Neuromuscular re-education;Manual techniques;Patient/family education;Dry needling;Spinal Manipulations    PT Next Visit Plan work on hip strength with core, internal manual work; see if the reveleum works    PT Home Exercise Plan Access Code: [email or web address]    Recommended Other Services renewal sent to MD    [employer] and Agree with Plan of Care Patient           Patient will benefit from skilled therapeutic intervention in order to improve the following deficits and impairments:  Decreased coordination,Decreased range of motion,Increased fascial restricitons,Pain,Decreased strength,Decreased activity tolerance,Decreased endurance,Increased muscle spasms  Visit Diagnosis: Muscle weakness (generalized)  Other lack of coordination  Cramp and spasm  Chronic midline low back pain without sciatica     Problem List Patient Active Problem List   Diagnosis Date Noted   Leukopenia 08/13/2019   Iron deficiency anemia 12/20/2018   Vitamin D deficiency 05/01/2018   Absence of breast, acquired, bilateral 10/26/2017   Ductal carcinoma in situ (DCIS) of left breast 09/26/2017   Physical exam 03/28/2017   Low back pain at multiple sites 05/26/2016   Genetic testing 10/17/2015   Chemotherapy induced neutropenia ([facility]) 02/03/2015   Neuropathy due to chemotherapeutic drug ([facility]) 01/13/2015   Constipation 12/09/2014   Hot flashes 12/09/2014   Spotting 11/20/2014   Vasovagal near syncope 11/16/2014   Malignant neoplasm of upper-outer quadrant of left breast in female, estrogen receptor positive ([facility]) 09/24/2014    Eulis Foster, PT 12/10/20 1:29 PM   [facility] [address], [address] [place], [place], [address] Phone: [phone]   Fax:  [phone]  Name: Nancy Austin MRN: [id number] Date of Birth: [birth year]

## 2020-12-18 ENCOUNTER — Encounter: Payer: Self-pay | Admitting: Physical Therapy

## 2020-12-18 ENCOUNTER — Other Ambulatory Visit: Payer: Self-pay

## 2020-12-18 ENCOUNTER — Ambulatory Visit: Payer: 59 | Attending: Oncology | Admitting: Physical Therapy

## 2020-12-18 DIAGNOSIS — M545 Low back pain, unspecified: Secondary | ICD-10-CM | POA: Insufficient documentation

## 2020-12-18 DIAGNOSIS — G8929 Other chronic pain: Secondary | ICD-10-CM | POA: Diagnosis not present

## 2020-12-18 DIAGNOSIS — R252 Cramp and spasm: Secondary | ICD-10-CM | POA: Insufficient documentation

## 2020-12-18 DIAGNOSIS — R278 Other lack of coordination: Secondary | ICD-10-CM | POA: Insufficient documentation

## 2020-12-18 DIAGNOSIS — M6281 Muscle weakness (generalized): Secondary | ICD-10-CM | POA: Insufficient documentation

## 2020-12-18 NOTE — Therapy (Addendum)
[facility] [address] [place], [place], [address] Phone: [phone]   Fax:  [phone]  Physical Therapy Treatment  Patient Details  Name: Nancy Austin MRN: [id number] Date of Birth: 1976-01-30 Referring Provider (PT): Dr. Lurline Del   Encounter Date: 12/18/2020   PT End of Session - 12/18/20 1143    Visit Number 7    Date for PT Re-Evaluation 03/04/21    Authorization Type UMR    PT Start Time 1100    PT Stop Time 1140    PT Time Calculation (min) 40 min    Activity Tolerance Patient tolerated treatment well    Behavior During Therapy [facility] for tasks assessed/performed           Past Medical History:  Diagnosis Date  . Allergy   . Anemia   . Benign skin lesion of forehead 10/2015  . Breast cancer ([place])   . Cancer ([place])   . History of breast cancer 09/2014   left  . History of chemotherapy    finished 02/10/2015  . History of radiation therapy 04/16/2015 - 06/02/2015  . Personal history of chemotherapy   . Personal history of radiation therapy     Past Surgical History:  Procedure Laterality Date  . AUGMENTATION MAMMAPLASTY Bilateral   . BREAST LUMPECTOMY    . BREAST RECONSTRUCTION WITH PLACEMENT OF TISSUE EXPANDER AND FLEX HD (ACELLULAR HYDRATED DERMIS) Right 09/26/2017   Procedure: RIGHT BREAST RECONSTRUCTION WITH PLACEMENT OF TISSUE EXPANDER AND FLEX HD (ACELLULAR HYDRATED DERMIS);  Surgeon: Irene Limbo, MD;  Location: [place];  Service: Plastics;  Laterality: Right;  . LAPAROSCOPIC OOPHERECTOMY Bilateral   . LAPAROSCOPIC SALPINGO OOPHERECTOMY Bilateral 11/12/2015   Procedure: LAPAROSCOPIC BILATERAL SALPINGO OOPHORECTOMY;  Surgeon: Paula Compton, MD;  Location: [facility];  Service: Gynecology;  Laterality: Bilateral;  . LATISSIMUS FLAP TO BREAST Left 09/26/2017   Procedure: LATISSIMUS FLAP TO LEFT BREAST;  Surgeon: Irene Limbo, MD;  Location: [place];  Service: Plastics;  Laterality:  Left;  . LESION REMOVAL Left 10/22/2015   Procedure: EXCISION OF FOREHEAD LESION 1X1CM;  Surgeon: Rolm Bookbinder, MD;  Location: [place];  Service: General;  Laterality: Left;  Marland Kitchen MASTECTOMY Bilateral   . NIPPLE SPARING MASTECTOMY Bilateral 09/26/2017   Procedure: LEFT NIPPLE SPARING MASTECTOMY, RIGHT PROPHYLACTIC NIPPLE SPARING MASTECTOMY;  Surgeon: Rolm Bookbinder, MD;  Location: [place];  Service: General;  Laterality: Bilateral;  . PORT-A-CATH REMOVAL N/A 10/22/2015   Procedure: REMOVAL PORT-A-CATH;  Surgeon: Rolm Bookbinder, MD;  Location: [place];  Service: General;  Laterality: N/A;  . PORTACATH PLACEMENT N/A 10/15/2014   Procedure: INSERTION PORT-A-CATH;  Surgeon: Rolm Bookbinder, MD;  Location: [facility];  Service: General;  Laterality: N/A;  . RADIOACTIVE SEED GUIDED EXCISIONAL BREAST BIOPSY Left 08/17/2017   Procedure: LEFT RADIOACTIVE SEED GUIDED EXCISIONAL BREAST BIOPSY ERAS PATHWAY;  Surgeon: Rolm Bookbinder, MD;  Location: [place];  Service: General;  Laterality: Left;  . RADIOACTIVE SEED GUIDED PARTIAL MASTECTOMY WITH AXILLARY SENTINEL LYMPH NODE BIOPSY Left 03/06/2015   Procedure: RADIOACTIVE SEED GUIDED PARTIAL MASTECTOMY WITH AXILLARY SENTINEL LYMPH NODE BIOPSY;  Surgeon: Rolm Bookbinder, MD;  Location: [place];  Service: General;  Laterality: Left;  . REMOVAL OF BILATERAL TISSUE EXPANDERS WITH PLACEMENT OF BILATERAL BREAST IMPLANTS Bilateral 01/06/2018   Procedure: REMOVAL OF BILATERAL TISSUE EXPANDERS WITH PLACEMENT OF BILATERAL BREAST IMPLANTS;  Surgeon: Irene Limbo, MD;  Location: [place];  Service: Plastics;  Laterality: Bilateral;  . TONSILLECTOMY  1996    There were no vitals filed for this visit.   Subjective Assessment - 12/18/20 1105    Subjective The initial penetration pain is better. I still have the strong urge to urinate. and get to the bathroom most of the time.     Patient Stated Goals reduce pain with leakage and improve pain with intercourse    Currently in Pain? Yes    Pain Score 1     Pain Location Vagina    Pain Orientation Mid    Pain Descriptors / Indicators Burning;Sharp    Pain Type Acute pain    Pain Onset More than a month ago    Pain Frequency Intermittent    Aggravating Factors  initial penetration with intercourse    Pain Relieving Factors no intercourse    Multiple Pain Sites No                          Pelvic Floor Special Questions - 12/18/20 0001    Pelvic Floor Internal Exam Patient confirms identification and approves PT to assess pelvic floor and treatment    Exam Type Vaginal    Strength fair squeeze, definite lift             [facility] Adult PT Treatment/Exercise - 12/18/20 0001      Lumbar Exercises: Quadruped   Other Quadruped Lumbar Exercises 1/2 kneel with pallof using red band then diagonals to engage the core and pelvic floor muscles      Manual Therapy   Internal Pelvic Floor laying on left side working on the perineal body,                  PT Education - 12/18/20 1141    Education Details Access Code: [email or web address]    Person(s) Educated Patient    Methods Explanation;Demonstration;Verbal cues;Handout    Comprehension Returned demonstration;Verbalized understanding            PT Short Term Goals - 12/10/20 1326      PT SHORT TERM GOAL #1   Title Pt will be independent with her HEP    Time 4    Period Weeks    Status Achieved      PT SHORT TERM GOAL #2   Title able to perform pelvic floor manual work to improve tissue mobiity    Time 4    Period Weeks    Status Achieved      PT SHORT TERM GOAL #3   Title understand ways to moisturize the vaginal tissue to improve dryness    Time 4    Period Weeks    Status Achieved      PT SHORT TERM GOAL #4   Title understand what lubricants to use for intercourse that are alright for the vaginal mucosa    Time 4    Period Weeks     Status Achieved             PT Long Term Goals - 12/18/20 1148      PT LONG TERM GOAL #1   Title independent with advanced HEP    Time 10    Period Weeks    Status On-going      PT LONG TERM GOAL #2   Title able to have penile penetration with pain level </= 1/10 due to improve mobility and hydration of tissue    Baseline pain level 1/10  Time 12    Period Weeks    Status Achieved      PT LONG TERM GOAL #3   Title urinary leakage with running and coughing decreased >/= 80% due to improved strength and coordination of the pelvic floor    Baseline leaks on the way to the urine    Time 10    Period Weeks    Status On-going      PT LONG TERM GOAL #4   Title lumbar pain decreased >/= 75% while she runs and lift due to reduction of thoracic kyphosis and improved core strength    Time 10    Period Weeks    Status Achieved                 Plan - 12/18/20 1143    Clinical Impression Statement Pelvic floor strength is 3/5 but needs tactile cues for lifting up the pelvic floor. Patient has improved mobility of the perineal body. Patient reports less leakage. Her initial pain with penile penetration is nos 1/10. Patient has advanced HEP for her core and pelvic floor. Patient has not tried the Reveleum. Patient is doing better with the urge to void. Patient will benefit from skilled therapy to improve pelvic floor coordination and improve tissue mobility.    Personal Factors and Comorbidities Comorbidity 2;Sex;Fitness    Comorbidities Left breast cancer with radiation and chemotherapy 12/2013; 11/12/2015 laparoscompis salpingo-oopherectomy; Latissimus flap left breast 09/26/2017; Patient on Tamoxifen    Examination-Activity Limitations Continence    Examination-Participation Restrictions Interpersonal Relationship    Stability/Clinical Decision Making Evolving/Moderate complexity    Rehab Potential Excellent    PT Frequency 1x / week    PT Duration Other (comment)   10  weeks   PT Treatment/Interventions ADLs/Self Care Home Management;Cryotherapy;Moist Heat;Electrical Stimulation;Therapeutic activities;Therapeutic exercise;Neuromuscular re-education;Manual techniques;Patient/family education;Dry needling;Spinal Manipulations    PT Next Visit Plan see how patient is doing in 1 month, possible manual work to the [employer], progress HEP    PT Home Exercise Plan Access Code: [email or web address]    Recommended Other Services MD signed all notes    Consulted and Agree with Plan of Care Patient           Patient will benefit from skilled therapeutic intervention in order to improve the following deficits and impairments:  Decreased coordination,Decreased range of motion,Increased fascial restricitons,Pain,Decreased strength,Decreased activity tolerance,Decreased endurance,Increased muscle spasms  Visit Diagnosis: Muscle weakness (generalized)  Other lack of coordination  Cramp and spasm  Chronic midline low back pain without sciatica     Problem List Patient Active Problem List   Diagnosis Date Noted  . Leukopenia 08/13/2019  . Iron deficiency anemia 12/20/2018  . Vitamin D deficiency 05/01/2018  . Absence of breast, acquired, bilateral 10/26/2017  . Ductal carcinoma in situ (DCIS) of left breast 09/26/2017  . Physical exam 03/28/2017  . Low back pain at multiple sites 05/26/2016  . Genetic testing 10/17/2015  . Chemotherapy induced neutropenia ([place]) 02/03/2015  . Neuropathy due to chemotherapeutic drug ([place]) 01/13/2015  . Constipation 12/09/2014  . Hot flashes 12/09/2014  . Spotting 11/20/2014  . Vasovagal near syncope 11/16/2014  . Malignant neoplasm of upper-outer quadrant of left breast in female, estrogen receptor positive ([place]) 09/24/2014    Earlie Counts, PT 12/18/20 11:49 AM   [facility] [address], [place], [place], [address] Phone: [phone]   Fax:  [phone]  Name:  Nancy Austin MRN: [id number] Date of Birth: 12/10/1976  PHYSICAL THERAPY DISCHARGE SUMMARY  Visits from Start of Care: 7  Current functional level related to goals / functional outcomes: See above.    Remaining deficits: See above. Patient called to be discharged due to feeling good.    Education / Equipment:HEP Plan: Patient agrees to discharge.  Patient goals were partially met. Patient is being discharged due to being pleased with the current functional level. Thank you for the referral. Earlie Counts, PT 01/19/21 9:21 AM   ?????

## 2020-12-18 NOTE — Patient Instructions (Signed)
Access Code: [email or web address] URL: [email or web address] Date: 12/18/2020 Prepared by: Eulis Foster  Exercises Supine Pelvic Floor Stretch - 1 x daily - 7 x weekly - 1 sets - 1 reps - 1 min hold Supine Figure 4 Piriformis Stretch with Leg Extension - 1 x daily - 7 x weekly - 1 sets - 2 reps - 30 sec hold Cat Cow - 1 x daily - 7 x weekly - 1 sets - 10 reps Child's Pose Stretch - 1 x daily - 7 x weekly - 1 sets - 1 reps - 30 sec hold Prone Press Up - 1 x daily - 7 x weekly - 1 sets - 10 reps Thoracic Extension Mobilization on Foam Roll - 1 x daily - 7 x weekly - 1 sets - 10 reps Prone Shoulder Horizontal Abduction - 1 x daily - 7 x weekly - 1 sets - 10 reps Supine Diaphragmatic Breathing with Pelvic Floor Lengthening - 2 x daily - 7 x weekly - 1 sets - 10 reps Prone Shoulder Extension Palm Down with Dumbbell - 1 x daily - 3 x weekly - 2 sets - 10 reps Prone Shoulder External Rotation - 1 x daily - 3 x weekly - 2 sets - 10 reps Prone Shoulder Horizontal Abduction with Thumbs Up - 1 x daily - 7 x weekly - 2 sets - 10 reps Sidelying Pelvic Floor Contraction with Self-Palpation - 1 x daily - 7 x weekly - 1 sets - 10 reps - 5 sec hold Standing Anti-Rotation Press with Anchored Resistance - 1 x daily - 7 x weekly - 3 sets - 10 reps Half Kneeling Diagonal Chops with Resistance - 1 x daily - 7 x weekly - 3 sets - 10 reps [facility] [address] [place], [place] [address] Phone # [phone] Fax [phone]

## 2021-01-02 ENCOUNTER — Telehealth: Payer: Self-pay | Admitting: Family Medicine

## 2021-01-02 NOTE — Telephone Encounter (Signed)
Fine with me.  Tomeeka Plaugher, MD [facility]   

## 2021-01-02 NOTE — Telephone Encounter (Signed)
Can you reach out to the pt to get them scheduled//ELEA

## 2021-01-02 NOTE — Telephone Encounter (Signed)
Scheduled TOC with Dr. Rogers Blocker

## 2021-01-02 NOTE — Telephone Encounter (Signed)
Pt called in asking to [facility] to Dr. Orma Flaming, please advise if you both are ok with that.

## 2021-01-02 NOTE — Telephone Encounter (Signed)
[place] w/ me.  Please wish her the best!

## 2021-01-22 ENCOUNTER — Ambulatory Visit: Payer: 59 | Admitting: Physical Therapy

## 2021-01-27 DIAGNOSIS — Z6820 Body mass index (BMI) 20.0-20.9, adult: Secondary | ICD-10-CM | POA: Diagnosis not present

## 2021-01-27 DIAGNOSIS — R42 Dizziness and giddiness: Secondary | ICD-10-CM | POA: Diagnosis not present

## 2021-01-27 DIAGNOSIS — Z1389 Encounter for screening for other disorder: Secondary | ICD-10-CM | POA: Diagnosis not present

## 2021-01-27 DIAGNOSIS — Z130 Encounter for screening for diseases of the blood and blood-forming organs and certain disorders involving the immune mechanism: Secondary | ICD-10-CM | POA: Diagnosis not present

## 2021-01-27 DIAGNOSIS — Z1151 Encounter for screening for human papillomavirus (HPV): Secondary | ICD-10-CM | POA: Diagnosis not present

## 2021-01-27 DIAGNOSIS — Z01419 Encounter for gynecological examination (general) (routine) without abnormal findings: Secondary | ICD-10-CM | POA: Diagnosis not present

## 2021-01-27 DIAGNOSIS — Z124 Encounter for screening for malignant neoplasm of cervix: Secondary | ICD-10-CM | POA: Diagnosis not present

## 2021-01-27 LAB — HM PAP SMEAR

## 2021-01-29 ENCOUNTER — Other Ambulatory Visit (HOSPITAL_COMMUNITY): Payer: Self-pay | Admitting: Obstetrics and Gynecology

## 2021-01-29 MED FILL — MECLIZINE HCL 25 MG TABS: 25 | 10 days supply | Qty: 10 | Fill #0

## 2021-02-03 ENCOUNTER — Ambulatory Visit: Payer: 59 | Admitting: Physician Assistant

## 2021-02-03 ENCOUNTER — Encounter: Payer: Self-pay | Admitting: Physician Assistant

## 2021-02-03 ENCOUNTER — Other Ambulatory Visit: Payer: Self-pay

## 2021-02-03 ENCOUNTER — Telehealth: Payer: Self-pay

## 2021-02-03 VITALS — BP 133/84 | HR 55 | Temp 97.0°F | Ht 66.0 in | Wt 121.4 lb

## 2021-02-03 DIAGNOSIS — R55 Syncope and collapse: Secondary | ICD-10-CM | POA: Diagnosis not present

## 2021-02-03 DIAGNOSIS — C50412 Malignant neoplasm of upper-outer quadrant of left female breast: Secondary | ICD-10-CM

## 2021-02-03 DIAGNOSIS — R42 Dizziness and giddiness: Secondary | ICD-10-CM

## 2021-02-03 DIAGNOSIS — Z170 Estrogen receptor positive status [ER+]: Secondary | ICD-10-CM

## 2021-02-03 NOTE — Progress Notes (Signed)
Acute Office Visit  Subjective:    Patient ID: Nancy Austin, female    DOB: [birth year], 45 y.o.   MRN: [id number]  Chief Complaint  Patient presents with  . Dizziness    HPI Patient is in today for dizziness x 2 weeks.  She has been having spells where she feels dizzy and lightheaded. Sometimes it feels like room-spinning sensation, other times like she might pass out.  01/24/21 fainted in the middle of the night. No confusion. Her friend was with her and heard her fall. She did hit her head with that fall but did not get examined. She has still been able to do her daily exercise runs, but has been taking it easy.  Saw her GYN, Dr. Marvel Plan, last week and had labs (CBC, CMET, TSH) checked, but were normal. Meclizine made her drowsy.   Past Medical History:  Diagnosis Date  . Allergy   . Anemia   . Benign skin lesion of forehead 10/2015  . Breast cancer ([place])   . Cancer ([place])   . History of breast cancer 09/2014   left  . History of chemotherapy    finished 02/10/2015  . History of radiation therapy 04/16/2015 - 06/02/2015  . Personal history of chemotherapy   . Personal history of radiation therapy     Past Surgical History:  Procedure Laterality Date  . AUGMENTATION MAMMAPLASTY Bilateral   . BREAST LUMPECTOMY    . BREAST RECONSTRUCTION WITH PLACEMENT OF TISSUE EXPANDER AND FLEX HD (ACELLULAR HYDRATED DERMIS) Right 09/26/2017   Procedure: RIGHT BREAST RECONSTRUCTION WITH PLACEMENT OF TISSUE EXPANDER AND FLEX HD (ACELLULAR HYDRATED DERMIS);  Surgeon: Irene Limbo, MD;  Location: [place];  Service: Plastics;  Laterality: Right;  . LAPAROSCOPIC OOPHERECTOMY Bilateral   . LAPAROSCOPIC SALPINGO OOPHERECTOMY Bilateral 11/12/2015   Procedure: LAPAROSCOPIC BILATERAL SALPINGO OOPHORECTOMY;  Surgeon: Paula Compton, MD;  Location: [facility];  Service: Gynecology;  Laterality: Bilateral;  . LATISSIMUS FLAP TO BREAST Left 09/26/2017   Procedure: LATISSIMUS FLAP TO LEFT BREAST;   Surgeon: Irene Limbo, MD;  Location: [place];  Service: Plastics;  Laterality: Left;  . LESION REMOVAL Left 10/22/2015   Procedure: EXCISION OF FOREHEAD LESION 1X1CM;  Surgeon: Rolm Bookbinder, MD;  Location: [place];  Service: General;  Laterality: Left;  Marland Kitchen MASTECTOMY Bilateral   . NIPPLE SPARING MASTECTOMY Bilateral 09/26/2017   Procedure: LEFT NIPPLE SPARING MASTECTOMY, RIGHT PROPHYLACTIC NIPPLE SPARING MASTECTOMY;  Surgeon: Rolm Bookbinder, MD;  Location: [place];  Service: General;  Laterality: Bilateral;  . PORT-A-CATH REMOVAL N/A 10/22/2015   Procedure: REMOVAL PORT-A-CATH;  Surgeon: Rolm Bookbinder, MD;  Location: [place];  Service: General;  Laterality: N/A;  . PORTACATH PLACEMENT N/A 10/15/2014   Procedure: INSERTION PORT-A-CATH;  Surgeon: Rolm Bookbinder, MD;  [facility];  Service: General;  Laterality: N/A;  . RADIOACTIVE SEED GUIDED EXCISIONAL BREAST BIOPSY Left 08/17/2017   Procedure: LEFT RADIOACTIVE SEED GUIDED EXCISIONAL BREAST BIOPSY ERAS PATHWAY;  Surgeon: Rolm Bookbinder, MD;  Location: [place];  Service: General;  Laterality: Left;  . RADIOACTIVE SEED GUIDED PARTIAL MASTECTOMY WITH AXILLARY SENTINEL LYMPH NODE BIOPSY Left 03/06/2015   Procedure: RADIOACTIVE SEED GUIDED PARTIAL MASTECTOMY WITH AXILLARY SENTINEL LYMPH NODE BIOPSY;  Surgeon: Rolm Bookbinder, MD;  Location: [place];  Service: General;  Laterality: Left;  . REMOVAL OF BILATERAL TISSUE EXPANDERS WITH PLACEMENT OF BILATERAL BREAST IMPLANTS Bilateral 01/06/2018   Procedure: REMOVAL OF BILATERAL TISSUE EXPANDERS WITH PLACEMENT OF BILATERAL BREAST  IMPLANTS;  Surgeon: Irene Limbo, MD;  Location: [place];  Service: Plastics;  Laterality: Bilateral;  . TONSILLECTOMY  1996    Family History  Problem Relation Age of Onset  . Skin cancer Father   . Hyperlipidemia Father   . Prostate cancer Father   . Prostate  cancer Maternal Uncle 57       currently 18  . Multiple myeloma Paternal Grandmother 24       Deceased 34  . Cancer Paternal Grandmother        bone  . Prostate cancer Paternal Uncle   . Lung disease Maternal Grandmother   . AAA (abdominal aortic aneurysm) Maternal Grandmother   . Stroke Maternal Grandfather   . Colon cancer Neg Hx   . Esophageal cancer Neg Hx   . Rectal cancer Neg Hx   . Stomach cancer Neg Hx     Social History   Socioeconomic History  . Marital status: Married    Spouse name: Not on file  . Number of children: 2  . Years of education: Not on file  . Highest education level: Not on file  Occupational History  . Not on file  Tobacco Use  . Smoking status: Never Smoker  . Smokeless tobacco: Never Used  Vaping Use  . Vaping Use: Never used  Substance and Sexual Activity  . Alcohol use: Yes    Comment: 3 x/week  . Drug use: No  . Sexual activity: Yes    Birth control/protection: Surgical  Other Topics Concern  . Not on file  Social History Narrative  . Not on file   Social Determinants of Health   Financial Resource Strain: Not on file  Food Insecurity: Not on file  Transportation Needs: Not on file  Physical Activity: Not on file  Stress: Not on file  Social Connections: Not on file  Intimate Partner Violence: Not on file    Outpatient Medications Prior to Visit  Medication Sig Dispense Refill  . celecoxib (CELEBREX) 100 MG capsule Take 100 mg by mouth daily.    Marland Kitchen gabapentin (NEURONTIN) 300 MG capsule Take at bedtime 90 capsule 4  . Multiple Vitamin (MULTI-VITAMINS) TABS Take by mouth.    . tamoxifen (NOLVADEX) 20 MG tablet Take 1 tablet (20 mg total) by mouth daily. 90 tablet 4  . cholecalciferol (VITAMIN D3) 25 MCG (1000 UT) tablet Take 1 tablet (1,000 Units total) by mouth daily.    Marland Kitchen estradiol (ESTRING) 2 MG vaginal ring Place 2 mg vaginally every 3 (three) months. follow package directions 1 each 12   No facility-administered  medications prior to visit.    Allergies  Allergen Reactions  . Chlorhexidine Itching and Rash  . Morphine And Related Nausea And Vomiting  . Other Rash    Dermabond  . Tegaderm Ag Mesh [Silver] Rash    Reports that the larger tegaderm causes rashes    Review of Systems  Constitutional: Negative for fever and unexpected weight change.  HENT: Negative for congestion.   Eyes: Negative for discharge.  Respiratory: Negative for cough, chest tightness and shortness of breath.   Cardiovascular: Positive for palpitations (sometimes feels a fluttering sensation). Negative for chest pain.  Gastrointestinal: Negative for blood in stool, constipation and diarrhea.  Genitourinary: Negative for dysuria.  Musculoskeletal: Negative for gait problem.  Neurological: Positive for dizziness and light-headedness. Negative for tremors, seizures, weakness and headaches.  Psychiatric/Behavioral: Negative for behavioral problems, confusion, sleep disturbance and suicidal ideas.  Objective:    Physical Exam Vitals and nursing note reviewed.  Constitutional:      Appearance: Normal appearance. She is normal weight. She is not toxic-appearing.  HENT:     Head: Normocephalic and atraumatic.     Right Ear: Tympanic membrane, ear canal and external ear normal.     Left Ear: Tympanic membrane, ear canal and external ear normal.     Nose: Nose normal.     Mouth/Throat:     Mouth: Mucous membranes are moist.  Eyes:     Extraocular Movements: Extraocular movements intact.     Conjunctiva/sclera: Conjunctivae normal.     Pupils: Pupils are equal, round, and reactive to light.  Cardiovascular:     Rate and Rhythm: Normal rate and regular rhythm.     Pulses: Normal pulses.     Heart sounds: Normal heart sounds.  Pulmonary:     Effort: Pulmonary effort is normal.     Breath sounds: Normal breath sounds.  Abdominal:     General: Abdomen is flat. Bowel sounds are normal.     Palpations: Abdomen is  soft.  Musculoskeletal:        General: Normal range of motion.     Cervical back: Normal range of motion and neck supple.  Skin:    General: Skin is warm and dry.  Neurological:     General: No focal deficit present.     Mental Status: She is alert and oriented to person, place, and time.     Cranial Nerves: Cranial nerves are intact.     Sensory: Sensation is intact.     Motor: Motor function is intact. No weakness or atrophy.     Coordination: Romberg sign negative. Coordination normal. Finger-Nose-Finger Test normal.     Gait: Gait is intact.     Deep Tendon Reflexes: Reflexes are normal and symmetric.  Psychiatric:        Mood and Affect: Mood normal.        Behavior: Behavior normal.        Thought Content: Thought content normal.        Judgment: Judgment normal.     BP 133/84   Pulse (!) 55   Temp (!) 97 F (36.1 C)   Ht 5' 6"  (1.676 m)   Wt 121 lb 6.1 oz (55.1 kg)   LMP 02/22/2015   SpO2 99%   BMI 19.59 kg/m  Wt Readings from Last 3 Encounters:  02/03/21 121 lb 6.1 oz (55.1 kg)  08/12/20 117 lb 12.8 oz (53.4 kg)  08/07/20 118 lb 2 oz (53.6 kg)    Lab Results  Component Value Date   TSH 2.09 04/22/2020   Lab Results  Component Value Date   WBC 2.6 (L) 08/12/2020   HGB 13.5 08/12/2020   HCT 41.9 08/12/2020   MCV 88.2 08/12/2020   PLT 184 08/12/2020   Lab Results  Component Value Date   NA 139 08/12/2020   K 4.0 08/12/2020   CHLORIDE 104 08/02/2017   CO2 28 08/12/2020   GLUCOSE 92 08/12/2020   BUN 11 08/12/2020   CREATININE 0.75 08/12/2020   BILITOT 0.4 08/12/2020   ALKPHOS 47 08/12/2020   AST 23 08/12/2020   ALT 14 08/12/2020   PROT 7.0 08/12/2020   ALBUMIN 4.1 08/12/2020   CALCIUM 10.1 08/12/2020   ANIONGAP 7 08/12/2020   EGFR 87 (L) 08/02/2017   GFR 87.87 04/22/2020   Lab Results  Component Value Date   CHOL 128  05/01/2018   Lab Results  Component Value Date   HDL 52.50 05/01/2018   Lab Results  Component Value Date   LDLCALC  60 05/01/2018   Lab Results  Component Value Date   TRIG 79.0 05/01/2018   Lab Results  Component Value Date   CHOLHDL 2 05/01/2018   No results found for: HGBA1C     Assessment & Plan:   Problem List Items Addressed This Visit   None   Visit Diagnoses    Dizziness    -  Primary   Relevant Orders   EKG 12-Lead (Completed)      1. Dizziness 2. Syncope and collapse 3. Malignant neoplasm of upper-outer quadrant of left breast in female, estrogen receptor positive ([place]) Concerning history with increase in dizzy episodes and one episode of actual syncope on 01/24/21. Neuro exam is normal in office today. I am going to get patient set up for a head CT as well as referrals to neurology and cardiology. Her EKG was NSR with no T-wave inversions. Orthostatic blood pressures were normal. She is going to stay well hydrated and avoid any extra exercise at this time. She will go straight to the ED in any event of new syncope.  This visit occurred during the SARS-CoV-2 public health emergency.  Safety protocols were in place, including screening questions prior to the visit, additional usage of staff PPE, and extensive cleaning of exam room while observing appropriate contact time as indicated for disinfecting solutions.    Maelani Yarbro M Ares Tegtmeyer, PA-C

## 2021-02-03 NOTE — Telephone Encounter (Signed)
Called patient to get an update on how she was doing and to see if she needed an appt. Patient did not answer. A vm was left for her stating to return call to office.

## 2021-02-03 NOTE — Telephone Encounter (Signed)
Nurse Assessment Nurse: Rock Nephew, RN, Juliann Pulse Date/Time (Eastern Time): 02/03/2021 9:34:17 AM Confirm and document reason for call. If symptomatic, describe symptoms. ---Caller states she is experiencing dizziness and is light headed. A week ago she woke up in the middle of the night and passed out and hit her head on the bath tub. She was having the dizziness prior to hitting her head however the symptoms seem worse since then . Does the patient have any new or worsening symptoms? ---Yes Will a triage be completed? ---Yes Related visit to physician within the last 2 weeks? ---Yes Does the PT have any chronic conditions? (i.e. diabetes, asthma, this includes High risk factors for pregnancy, etc.) ---No Is the patient pregnant or possibly pregnant? (Ask all females between the ages of 23-55) ---No Is this a behavioral health or substance abuse call? ---No Guidelines Guideline Title Affirmed Question Affirmed Notes Nurse Date/Time (Eastern Time) Dizziness - Lightheadedness [1] MODERATE dizziness (e.g., interferes with normal activities) AND [2] has NOT been evaluated by physician for this (Exception: dizziness Wells, RN, Juliann Pulse 02/03/2021 9:37:46 AM PLEASE NOTE: All timestamps contained within this report are represented as [place] Standard Time. CONFIDENTIALTY NOTICE: This fax transmission is intended only for the addressee. It contains information that is legally privileged, confidential or otherwise protected from use or disclosure. If you are not the intended recipient, you are strictly prohibited from reviewing, disclosing, copying using or disseminating any of this information or taking any action in reliance on or regarding this information. If you have received this fax in error, please notify us immediately by telephone so that we can arrange for its return to [place]. Phone: [phone], Toll-Free: [phone], Fax: [phone] Page: 2 of 2 Call Id: [id number] Guidelines Guideline  Title Affirmed Question Affirmed Notes Nurse Date/Time Eilene Ghazi Time) caused by heat exposure, sudden standing, or poor fluid intake) Disp. Time Eilene Ghazi Time) Disposition Final User 02/03/2021 9:31:53 AM Send to Urgent Tobie Lords 02/03/2021 9:41:58 AM See PCP within 24 Hours Yes Rock Nephew, RN, Gara Kroner Disagree/Comply Comply Caller Understands Yes PreDisposition Call Doctor Care Advice Given Per Guideline SEE PCP WITHIN 24 HOURS: * IF OFFICE WILL BE OPEN: You need to be examined within the next 24 hours. Call your doctor (or NP/PA) when the office opens and make an appointment. DRINK FLUIDS: * Drink several glasses of fruit juice, other clear fluids or water. * This will improve hydration and blood glucose. LIE DOWN AND REST: * Lie down with feet elevated for 1 hour. * This will improve circulation and increase blood flow to the brain. CALL BACK IF: * Passes out (faints) * You become worse CARE ADVICE given per Dizziness (Adult) guideline. Referrals REFERRED TO PCP OFFICE

## 2021-02-03 NOTE — Patient Instructions (Signed)
Someone will call to set up the head CT and referrals to neurology and cardiology. Please monitor your symptoms and blood pressure. Go straight to the ER if any sudden fainting, worst headache of life, chest pain, or other acute concerning events.

## 2021-02-03 NOTE — Telephone Encounter (Signed)
Thank you for reaching out to pt.  If we don't hear back today, we can call again tomorrow.  I see that she is scheduled for [facility] w/ Dr Rogers Blocker.  I am happy to see her until that time or we can get her in sooner if needed.

## 2021-02-03 NOTE — Telephone Encounter (Signed)
You're welcome! I will check on her tomorrow.

## 2021-02-04 ENCOUNTER — Other Ambulatory Visit (HOSPITAL_COMMUNITY): Payer: Self-pay | Admitting: Internal Medicine

## 2021-02-04 MED FILL — CELECOXIB 100 MG CAPS: 100 | 90 days supply | Qty: 90 | Fill #0

## 2021-02-05 ENCOUNTER — Ambulatory Visit: Payer: 59 | Admitting: Physician Assistant

## 2021-02-06 ENCOUNTER — Encounter: Payer: Self-pay | Admitting: Physician Assistant

## 2021-02-09 DIAGNOSIS — Z79810 Long term (current) use of selective estrogen receptor modulators (SERMs): Secondary | ICD-10-CM | POA: Diagnosis not present

## 2021-02-09 DIAGNOSIS — N8500 Endometrial hyperplasia, unspecified: Secondary | ICD-10-CM | POA: Diagnosis not present

## 2021-02-19 ENCOUNTER — Encounter: Payer: Self-pay | Admitting: Cardiology

## 2021-02-19 ENCOUNTER — Ambulatory Visit (INDEPENDENT_AMBULATORY_CARE_PROVIDER_SITE_OTHER): Payer: 59 | Admitting: Cardiology

## 2021-02-19 ENCOUNTER — Other Ambulatory Visit: Payer: Self-pay

## 2021-02-19 ENCOUNTER — Encounter: Payer: Self-pay | Admitting: Radiology

## 2021-02-19 VITALS — BP 133/80 | HR 60 | Ht 66.0 in | Wt 120.8 lb

## 2021-02-19 DIAGNOSIS — R42 Dizziness and giddiness: Secondary | ICD-10-CM | POA: Diagnosis not present

## 2021-02-19 DIAGNOSIS — R002 Palpitations: Secondary | ICD-10-CM | POA: Diagnosis not present

## 2021-02-19 DIAGNOSIS — R55 Syncope and collapse: Secondary | ICD-10-CM | POA: Diagnosis not present

## 2021-02-19 NOTE — Progress Notes (Signed)
Cardiology Consult  Note    Date:  02/19/2021   ID:  [name], DOB [birth year], MRN [id number]  PCP:  Midge Minium, MD  Cardiologist:  Fransico Him, MD   Chief Complaint  Patient presents with  . New Patient (Initial Visit)    Syncope and palpitations    History of Present Illness:  Nancy Austin is a 45 y.o. female who is being seen today for the evaluation of dizziness at the request of Allwardt, Alyssa M, PA-C.  This is a 45yo female with a hx of breast CA s/p chemo in 2016 and XRT but no cardiac hx who presents for evaluation of dizziness.  She says that she had bee having dizziness sporadically for a few weeks and then had a syncopal episode.  She describes it as an unsteadiness but also a lightheadedness.  The room dose not spin.  There is nothing that brings it on.  The is no associated nausea, diaphoresis or vertigo with the symptoms.  She recently had been sleeping and got up to go the bathroom.  She urinated and then got dizzy and passed out and hit her head on the tub.  She was not out very long and there was no tongue biting and was oriented when she got up.  She has not had any further syncopal episodes.  She tells me that she has passed out when she was in high school and in college and one time when they accessed her port for chemo.  She denies any SOB or DOE but at times feels a fluttering in her chest that lasts a few seconds and resolved.  She denies any chest pain or pressure, LE edema, PND or orthopnea. She smoked in college socially.  She occasionally drinks ETOH.  There is no fm hx of CAD.  Past Medical History:  Diagnosis Date  . Allergy   . Anemia   . Benign skin lesion of forehead 10/2015  . Breast cancer ([place])   . Cancer ([place])   . History of breast cancer 09/2014   left  . History of chemotherapy    finished 02/10/2015  . History of radiation therapy 04/16/2015 - 06/02/2015  . Personal history of chemotherapy   . Personal history of radiation  therapy     Past Surgical History:  Procedure Laterality Date  . AUGMENTATION MAMMAPLASTY Bilateral   . BREAST LUMPECTOMY    . BREAST RECONSTRUCTION WITH PLACEMENT OF TISSUE EXPANDER AND FLEX HD (ACELLULAR HYDRATED DERMIS) Right 09/26/2017   Procedure: RIGHT BREAST RECONSTRUCTION WITH PLACEMENT OF TISSUE EXPANDER AND FLEX HD (ACELLULAR HYDRATED DERMIS);  Surgeon: Irene Limbo, MD;  Location: [facility];  Service: Plastics;  Laterality: Right;  . LAPAROSCOPIC OOPHERECTOMY Bilateral   . LAPAROSCOPIC SALPINGO OOPHERECTOMY Bilateral 11/12/2015   Procedure: LAPAROSCOPIC BILATERAL SALPINGO OOPHORECTOMY;  Surgeon: Paula Compton, MD;  Location: [facility];  Service: Gynecology;  Laterality: Bilateral;  . LATISSIMUS FLAP TO BREAST Left 09/26/2017   Procedure: LATISSIMUS FLAP TO LEFT BREAST;  Surgeon: Irene Limbo, MD;  Location: [facility];  Service: Plastics;  Laterality: Left;  . LESION REMOVAL Left 10/22/2015   Procedure: EXCISION OF FOREHEAD LESION 1X1CM;  Surgeon: Rolm Bookbinder, MD;  Location: [facility];  Service: General;  Laterality: Left;  Marland Kitchen MASTECTOMY Bilateral   . NIPPLE SPARING MASTECTOMY Bilateral 09/26/2017   Procedure: LEFT NIPPLE SPARING MASTECTOMY, RIGHT PROPHYLACTIC NIPPLE SPARING MASTECTOMY;  Surgeon: Rolm Bookbinder, MD;  Location: [place];  Service: General;  Laterality:  Bilateral;  . PORT-A-CATH REMOVAL N/A 10/22/2015   Procedure: REMOVAL PORT-A-CATH;  Surgeon: Rolm Bookbinder, MD;  Location: [facility];  Service: General;  Laterality: N/A;  . PORTACATH PLACEMENT N/A 10/15/2014   Procedure: INSERTION PORT-A-CATH;  Surgeon: Rolm Bookbinder, MD;  Location: [facility];  Service: General;  Laterality: N/A;  . RADIOACTIVE SEED GUIDED EXCISIONAL BREAST BIOPSY Left 08/17/2017   Procedure: LEFT RADIOACTIVE SEED GUIDED EXCISIONAL BREAST BIOPSY ERAS PATHWAY;  Surgeon: Rolm Bookbinder, MD;  Location: [facility];  Service: General;  Laterality:  Left;  . RADIOACTIVE SEED GUIDED PARTIAL MASTECTOMY WITH AXILLARY SENTINEL LYMPH NODE BIOPSY Left 03/06/2015   Procedure: RADIOACTIVE SEED GUIDED PARTIAL MASTECTOMY WITH AXILLARY SENTINEL LYMPH NODE BIOPSY;  Surgeon: Rolm Bookbinder, MD;  Location: [facility];  Service: General;  Laterality: Left;  . REMOVAL OF BILATERAL TISSUE EXPANDERS WITH PLACEMENT OF BILATERAL BREAST IMPLANTS Bilateral 01/06/2018   Procedure: REMOVAL OF BILATERAL TISSUE EXPANDERS WITH PLACEMENT OF BILATERAL BREAST IMPLANTS;  Surgeon: Irene Limbo, MD;  Location: [facility];  Service: Plastics;  Laterality: Bilateral;  . TONSILLECTOMY  1996    Current Medications: Current Meds  Medication Sig  . celecoxib (CELEBREX) 100 MG capsule Take 100 mg by mouth daily.  Marland Kitchen gabapentin (NEURONTIN) 300 MG capsule Take at bedtime  . Multiple Vitamin (MULTI-VITAMINS) TABS Take by mouth.  . tamoxifen (NOLVADEX) 20 MG tablet Take 1 tablet (20 mg total) by mouth daily.    Allergies:   Chlorhexidine, Morphine and related, Other, and Tegaderm ag mesh [silver]   Social History   Socioeconomic History  . Marital status: Married    Spouse name: Not on file  . Number of children: 2  . Years of education: Not on file  . Highest education level: Not on file  Occupational History  . Not on file  Tobacco Use  . Smoking status: Never Smoker  . Smokeless tobacco: Never Used  Vaping Use  . Vaping Use: Never used  Substance and Sexual Activity  . Alcohol use: Yes    Comment: 3 x/week  . Drug use: No  . Sexual activity: Yes    Birth control/protection: Surgical  Other Topics Concern  . Not on file  Social History Narrative  . Not on file   Social Determinants of Health   Financial Resource Strain: Not on file  Food Insecurity: Not on file  Transportation Needs: Not on file  Physical Activity: Not on file  Stress: Not on file  Social Connections: Not on file     Family History:  The patient's  family history includes AAA (abdominal aortic aneurysm) in her maternal grandmother; Cancer in her paternal grandmother; Hyperlipidemia in her father; Lung disease in her maternal grandmother; Multiple myeloma (age of onset: 84) in her paternal grandmother; Prostate cancer in her father and paternal uncle; Prostate cancer (age of onset: 21) in her maternal uncle; Skin cancer in her father; Stroke in her maternal grandfather.   ROS:   Please see the history of present illness.    ROS All other systems reviewed and are negative.  No flowsheet data found.     PHYSICAL EXAM:   VS:  BP 133/80   Pulse 60   Ht 5' 6"  (1.676 m)   Wt 120 lb 12.8 oz (54.8 kg)   LMP 02/22/2015   BMI 19.50 kg/m    GEN: Well nourished, well developed, in no acute distress  HEENT: normal  Neck: no JVD, carotid bruits, or masses  Cardiac: RRR; no murmurs, rubs, or gallops,no edema.  Intact distal pulses bilaterally.  Respiratory:  clear to auscultation bilaterally, normal work of breathing GI: soft, nontender, nondistended, + BS MS: no deformity or atrophy  Skin: warm and dry, no rash Neuro:  Alert and Oriented x 3, Strength and sensation are intact Psych: euthymic mood, full affect  Wt Readings from Last 3 Encounters:  02/19/21 120 lb 12.8 oz (54.8 kg)  02/03/21 121 lb 6.1 oz (55.1 kg)  08/12/20 117 lb 12.8 oz (53.4 kg)      Studies/Labs Reviewed:   EKG:  EKG is not ordered today.  EKG reviewed from PCP office 02/03/21 and showed normal rhythm with normal intervals and no ST changes  Recent Labs: 04/22/2020: TSH 2.09 08/12/2020: ALT 14; BUN 11; Creatinine, Ser 0.75; Hemoglobin 13.5; Platelets 184; Potassium 4.0; Sodium 139   Lipid Panel    Component Value Date/Time   CHOL 128 05/01/2018 1125   TRIG 79.0 05/01/2018 1125   HDL 52.50 05/01/2018 1125   CHOLHDL 2 05/01/2018 1125   VLDL 15.8 05/01/2018 1125   LDLCALC 60 05/01/2018 1125     Additional studies/ records that were reviewed today include:   OV notes from PCP  ASSESSMENT:    1. Syncope and collapse   2. Dizziness   3. Palpitations      PLAN:  In order of problems listed above:  1. Syncope/dizziness -she has a long hx of dizzy spells and syncope as a teenager and in college and once when accessing her port for chemo -her recent syncopal episode sounds vagal in origin as she has just urinated -orthostatic BPs in office normal today -EKG is normal -she normally has a HR in the 40-50's and this has been all her life and she runs and works out a lot so low HR is normal for her -I will get a 2D echo to assess LVF and rule out valvular heart disease -event monitor to assess for arrhythmias give hx of palpitations as well -if workup normal consider Tilt Table testing  2.  Palpitations -I will get an event monitor to assess further -she drinks 2 cups of coffee in the am but o/w no other caffeine    Medication Adjustments/Labs and Tests Ordered: Current medicines are reviewed at length with the patient today.  Concerns regarding medicines are outlined above.  Medication changes, Labs and Tests ordered today are listed in the Patient Instructions below.  There are no Patient Instructions on file for this visit.   Signed, Fransico Him, MD  02/19/2021 10:32 AM    [facility] [address], [place], [place]  [address] Phone: [phone]; Fax: [phone]

## 2021-02-19 NOTE — Progress Notes (Signed)
Enrolled patient for a 30 day [employer] Monitor to be mailed to patients home.

## 2021-02-19 NOTE — Patient Instructions (Signed)
Medication Instructions:  Your physician recommends that you continue on your current medications as directed. Please refer to the Current Medication list given to you today.  *If you need a refill on your cardiac medications before your next appointment, please call your pharmacy*   Testing/Procedures: Your physician has requested that you have an echocardiogram. Echocardiography is a painless test that uses sound waves to create images of your heart. It provides your doctor with information about the size and shape of your heart and how well your heart's chambers and valves are working. This procedure takes approximately one hour. There are no restrictions for this procedure.  Your physician has recommended that you wear an event monitor. Event monitors are medical devices that record the heart's electrical activity. Doctors most often [place] these monitors to diagnose arrhythmias. Arrhythmias are problems with the speed or rhythm of the heartbeat. The monitor is a small, portable device. You can wear one while you do your normal daily activities. This is usually used to diagnose what is causing palpitations/syncope (passing out).  Follow-Up: At [facility], you and your health needs are our priority.  As part of our continuing mission to provide you with exceptional heart care, we have created designated Provider Care Teams.  These Care Teams include your primary Cardiologist (physician) and Advanced Practice Providers (APPs -  Physician Assistants and Nurse Practitioners) who all work together to provide you with the care you need, when you need it.  Your next appointment:   6 week(s)  The format for your next appointment:   In Person  Provider:   You may see Fransico Him, MD or one of the following Advanced Practice Providers on your designated Care Team:    Melina Copa, PA-C  Ermalinda Barrios, PA-C

## 2021-02-20 ENCOUNTER — Ambulatory Visit
Admission: RE | Admit: 2021-02-20 | Discharge: 2021-02-20 | Disposition: A | Discharge status: Home / Self Care | Payer: 59 | Source: Ambulatory Visit | Attending: Physician Assistant | Admitting: Physician Assistant

## 2021-02-20 DIAGNOSIS — R42 Dizziness and giddiness: Secondary | ICD-10-CM

## 2021-02-20 DIAGNOSIS — R55 Syncope and collapse: Secondary | ICD-10-CM

## 2021-02-26 ENCOUNTER — Ambulatory Visit (INDEPENDENT_AMBULATORY_CARE_PROVIDER_SITE_OTHER): Payer: 59

## 2021-02-26 DIAGNOSIS — R55 Syncope and collapse: Secondary | ICD-10-CM

## 2021-02-26 DIAGNOSIS — R002 Palpitations: Secondary | ICD-10-CM

## 2021-03-03 DIAGNOSIS — R9389 Abnormal findings on diagnostic imaging of other specified body structures: Secondary | ICD-10-CM | POA: Diagnosis not present

## 2021-03-10 DIAGNOSIS — D225 Melanocytic nevi of trunk: Secondary | ICD-10-CM | POA: Diagnosis not present

## 2021-03-10 DIAGNOSIS — L814 Other melanin hyperpigmentation: Secondary | ICD-10-CM | POA: Diagnosis not present

## 2021-03-10 DIAGNOSIS — L578 Other skin changes due to chronic exposure to nonionizing radiation: Secondary | ICD-10-CM | POA: Diagnosis not present

## 2021-03-10 DIAGNOSIS — L821 Other seborrheic keratosis: Secondary | ICD-10-CM | POA: Diagnosis not present

## 2021-03-17 ENCOUNTER — Ambulatory Visit (HOSPITAL_COMMUNITY): Payer: 59 | Attending: Cardiology

## 2021-03-17 ENCOUNTER — Other Ambulatory Visit: Payer: Self-pay

## 2021-03-17 DIAGNOSIS — R55 Syncope and collapse: Secondary | ICD-10-CM | POA: Insufficient documentation

## 2021-03-17 LAB — ECHOCARDIOGRAM COMPLETE
Area-P 1/2: 2.83 cm2
S' Lateral: 3.2 cm

## 2021-04-06 NOTE — Progress Notes (Signed)
Cardiology  Note    Date:  04/07/2021   ID:  Nancy Austin, DOB [birth year], MRN [id number]  PCP:  Orland Mustard, MD  Cardiologist:  Armanda Magic, MD   Chief Complaint  Patient presents with  . Follow-up    Syncope and palpitations    History of Present Illness:  Nancy Austin is a 45 y.o. female with a hx of breast CA s/p chemo in 2016 and XRT but no cardiac hx who presented recently for evaluation of dizziness.  She was having dizziness sporadically for a few weeks and then had a syncopal episode.  She described it as an unsteadiness but also a lightheadedness.  The room did not spin. There was no associated nausea, diaphoresis or vertigo with the symptoms.  She recently had been sleeping and got up to go the bathroom.  She urinated and then got dizzy and passed out and hit her head on the tub.  She was not out very long and there was no tongue biting and was oriented when she got up.  She has not had any further syncopal episodes of dizziness.  She passed out when she was in high school and in college and one time when they accessed her port for chemo.  SHe is here today for followup and is doing well.  She denies any chest pain or pressure, SOB, DOE, PND, orthopnea, LE edema, dizziness, palpitations or syncope. She is compliant with her meds and is tolerating meds with no SE.    Past Medical History:  Diagnosis Date  . Allergy   . Anemia   . Benign skin lesion of forehead 10/2015  . Breast cancer ([facility])   . Cancer ([facility])   . History of breast cancer 09/2014   left  . History of chemotherapy    finished 02/10/2015  . History of radiation therapy 04/16/2015 - 06/02/2015  . Palpitations    5 beats run of PAT noted on event monitor  . PAT (paroxysmal atrial tachycardia) ([facility])    1 episode of 5 beat run of PAT on event monitor>>asymptomatic  . Personal history of chemotherapy   . Personal history of radiation therapy   . Vasovagal syncope     Past Surgical History:  Procedure  Laterality Date  . AUGMENTATION MAMMAPLASTY Bilateral   . BREAST LUMPECTOMY    . BREAST RECONSTRUCTION WITH PLACEMENT OF TISSUE EXPANDER AND FLEX HD (ACELLULAR HYDRATED DERMIS) Right 09/26/2017   Procedure: RIGHT BREAST RECONSTRUCTION WITH PLACEMENT OF TISSUE EXPANDER AND FLEX HD (ACELLULAR HYDRATED DERMIS);  Surgeon: Glenna Fellows, MD;  [facility];  Service: Plastics;  Laterality: Right;  . LAPAROSCOPIC OOPHERECTOMY Bilateral   . LAPAROSCOPIC SALPINGO OOPHERECTOMY Bilateral 11/12/2015   Procedure: LAPAROSCOPIC BILATERAL SALPINGO OOPHORECTOMY;  Surgeon: Huel Cote, MD;  Location: [facility];  Service: Gynecology;  Laterality: Bilateral;  . LATISSIMUS FLAP TO BREAST Left 09/26/2017   Procedure: LATISSIMUS FLAP TO LEFT BREAST;  Surgeon: Glenna Fellows, MD;  Location[facility];  Service: Plastics;  Laterality: Left;  . LESION REMOVAL Left 10/22/2015   Procedure: EXCISION OF FOREHEAD LESION 1X1CM;  Surgeon: Emelia Loron, MD;  Location: [facility];  Service: General;  Laterality: Left;  Marland Kitchen MASTECTOMY Bilateral   . NIPPLE SPARING MASTECTOMY Bilateral 09/26/2017   Procedure: LEFT NIPPLE SPARING MASTECTOMY, RIGHT PROPHYLACTIC NIPPLE SPARING MASTECTOMY;  Surgeon: Emelia Loron, MD;  [facility];  Service: General;  Laterality: Bilateral;  . PORT-A-CATH REMOVAL N/A 10/22/2015   Procedure: REMOVAL PORT-A-CATH;  Surgeon: Rolm Bookbinder, MD;  Location: [facility];  Service: General;  Laterality: N/A;  . PORTACATH PLACEMENT N/A 10/15/2014   Procedure: INSERTION PORT-A-CATH;  Surgeon: Rolm Bookbinder, MD;  Location: [facility];  Service: General;  Laterality: N/A;  . RADIOACTIVE SEED GUIDED EXCISIONAL BREAST BIOPSY Left 08/17/2017   Procedure: LEFT RADIOACTIVE SEED GUIDED EXCISIONAL BREAST BIOPSY ERAS PATHWAY;  Surgeon: Rolm Bookbinder, MD;  Location: [facility];  Service: General;  Laterality: Left;  . RADIOACTIVE SEED GUIDED PARTIAL  MASTECTOMY WITH AXILLARY SENTINEL LYMPH NODE BIOPSY Left 03/06/2015   Procedure: RADIOACTIVE SEED GUIDED PARTIAL MASTECTOMY WITH AXILLARY SENTINEL LYMPH NODE BIOPSY;  Surgeon: Rolm Bookbinder, MD;  Location: [facility];  Service: General;  Laterality: Left;  . REMOVAL OF BILATERAL TISSUE EXPANDERS WITH PLACEMENT OF BILATERAL BREAST IMPLANTS Bilateral 01/06/2018   Procedure: REMOVAL OF BILATERAL TISSUE EXPANDERS WITH PLACEMENT OF BILATERAL BREAST IMPLANTS;  Surgeon: Irene Limbo, MD;  Location: [facility];  Service: Plastics;  Laterality: Bilateral;  . TONSILLECTOMY  1996    Current Medications: Current Meds  Medication Sig  . celecoxib (CELEBREX) 100 MG capsule TAKE 1 CAPSULE BY MOUTH ONCE DAILY WITH FOOD (Patient taking differently: Take by mouth daily. with food)  . gabapentin (NEURONTIN) 300 MG capsule Take at bedtime  . Multiple Vitamin (MULTI-VITAMINS) TABS Take by mouth.  . tamoxifen (NOLVADEX) 20 MG tablet Take 1 tablet (20 mg total) by mouth daily.    Allergies:   Chlorhexidine, Morphine and related, Other, and Tegaderm ag mesh [silver]   Social History   Socioeconomic History  . Marital status: Married    Spouse name: Not on file  . Number of children: 2  . Years of education: Not on file  . Highest education level: Not on file  Occupational History  . Not on file  Tobacco Use  . Smoking status: Never Smoker  . Smokeless tobacco: Never Used  Vaping Use  . Vaping Use: Never used  Substance and Sexual Activity  . Alcohol use: Yes    Comment: 3 x/week  . Drug use: No  . Sexual activity: Yes    Birth control/protection: Surgical  Other Topics Concern  . Not on file  Social History Narrative  . Not on file   Social Determinants of Health   Financial Resource Strain: Not on file  Food Insecurity: Not on file  Transportation Needs: Not on file  Physical Activity: Not on file  Stress: Not on file  Social Connections: Not on file      Family History:  The patient's family history includes AAA (abdominal aortic aneurysm) in her maternal grandmother; Cancer in her paternal grandmother; Hyperlipidemia in her father; Lung disease in her maternal grandmother; Multiple myeloma (age of onset: 6) in her paternal grandmother; Prostate cancer in her father and paternal uncle; Prostate cancer (age of onset: 77) in her maternal uncle; Skin cancer in her father; Stroke in her maternal grandfather.   ROS:   Please see the history of present illness.    ROS All other systems reviewed and are negative.  No flowsheet data found.     PHYSICAL EXAM:   VS:  BP 98/60   Pulse (!) 54   Ht $R'5\' 6"'Af$  (1.676 m)   Wt 123 lb 6.4 oz (56 kg)   LMP 02/22/2015   SpO2 99%   BMI 19.92 kg/m     GEN: Well nourished, well developed in no acute distress HEENT: Normal NECK: No JVD; No  carotid bruits LYMPHATICS: No lymphadenopathy CARDIAC:RRR, no murmurs, rubs, gallops RESPIRATORY:  Clear to auscultation without rales, wheezing or rhonchi  ABDOMEN: Soft, non-tender, non-distended MUSCULOSKELETAL:  No edema; No deformity  SKIN: Warm and dry NEUROLOGIC:  Alert and oriented x 3 PSYCHIATRIC:  Normal affect    Wt Readings from Last 3 Encounters:  04/07/21 123 lb 6.4 oz (56 kg)  02/19/21 120 lb 12.8 oz (54.8 kg)  02/03/21 121 lb 6.1 oz (55.1 kg)      Studies/Labs Reviewed:   EKG:  EKG is not ordered today.  Recent Labs: 04/22/2020: TSH 2.09 08/12/2020: ALT 14; BUN 11; Creatinine, Ser 0.75; Hemoglobin 13.5; Platelets 184; Potassium 4.0; Sodium 139   Lipid Panel    Component Value Date/Time   CHOL 128 05/01/2018 1125   TRIG 79.0 05/01/2018 1125   HDL 52.50 05/01/2018 1125   CHOLHDL 2 05/01/2018 1125   VLDL 15.8 05/01/2018 1125   LDLCALC 60 05/01/2018 1125     Additional studies/ records that were reviewed today include:  OV notes from PCP  ASSESSMENT:    1. Syncope and collapse   2. Palpitations   3. Vasovagal syncope   4.  PAT (paroxysmal atrial tachycardia) ([facility])      PLAN:  In order of problems listed above:  1.  Syncope/dizziness -she has a long hx of dizzy spells and syncope as a teenager and in college and once when accessing her port for chemo -her most recent syncopal episode sounds vagal in origin as she had just urinated -orthostatic BPs at last OV were normal -she normally has a HR in the 40-50's and this has been all her life and she runs and works out a lot so low HR is normal for her -2D echo 03/17/2021 was normal with mild MR -event monitor showed an isolated episode of PAT for 5 beats and she was instructed to cut out caffiene -she has not had any further syncope or dizziness.    2.  Palpitations -event monitor showed an isolated episode of PAT for 5 beats and she was instructed to cut out caffiene   Medication Adjustments/Labs and Tests Ordered: Current medicines are reviewed at length with the patient today.  Concerns regarding medicines are outlined above.  Medication changes, Labs and Tests ordered today are listed in the Patient Instructions below.  There are no Patient Instructions on file for this visit.   Signed, Fransico Him, MD  04/07/2021 10:19 AM    [facility] [address], [place], [place]  [address] Phone: [phone]; Fax: [phone]

## 2021-04-07 ENCOUNTER — Other Ambulatory Visit: Payer: Self-pay

## 2021-04-07 ENCOUNTER — Ambulatory Visit (INDEPENDENT_AMBULATORY_CARE_PROVIDER_SITE_OTHER): Payer: 59 | Admitting: Cardiology

## 2021-04-07 ENCOUNTER — Encounter: Payer: Self-pay | Admitting: Cardiology

## 2021-04-07 VITALS — BP 98/60 | HR 54 | Ht 66.0 in | Wt 123.4 lb

## 2021-04-07 DIAGNOSIS — R002 Palpitations: Secondary | ICD-10-CM | POA: Diagnosis not present

## 2021-04-07 DIAGNOSIS — R55 Syncope and collapse: Secondary | ICD-10-CM | POA: Diagnosis not present

## 2021-04-07 DIAGNOSIS — I471 Supraventricular tachycardia: Secondary | ICD-10-CM | POA: Diagnosis not present

## 2021-04-07 NOTE — Patient Instructions (Signed)

## 2021-04-20 ENCOUNTER — Ambulatory Visit: Payer: Self-pay | Admitting: Diagnostic Neuroimaging

## 2021-05-05 ENCOUNTER — Ambulatory Visit: Payer: 59 | Admitting: Family

## 2021-05-18 ENCOUNTER — Telehealth: Payer: Self-pay | Admitting: *Deleted

## 2021-05-18 ENCOUNTER — Other Ambulatory Visit: Payer: Self-pay | Admitting: *Deleted

## 2021-05-18 DIAGNOSIS — R2231 Localized swelling, mass and lump, right upper limb: Secondary | ICD-10-CM

## 2021-05-18 DIAGNOSIS — Z170 Estrogen receptor positive status [ER+]: Secondary | ICD-10-CM

## 2021-05-18 DIAGNOSIS — C50412 Malignant neoplasm of upper-outer quadrant of left female breast: Secondary | ICD-10-CM

## 2021-05-18 NOTE — Telephone Encounter (Signed)
Spoke with patient and she has felt a lump in her right axilla x 3 weeks.  Right axilla U/S ordered.

## 2021-06-05 ENCOUNTER — Other Ambulatory Visit (HOSPITAL_COMMUNITY): Payer: Self-pay

## 2021-06-05 ENCOUNTER — Ambulatory Visit
Admission: RE | Admit: 2021-06-05 | Discharge: 2021-06-05 | Disposition: A | Discharge status: Home / Self Care | Payer: 59 | Source: Ambulatory Visit | Attending: Oncology | Admitting: Oncology

## 2021-06-05 ENCOUNTER — Other Ambulatory Visit: Payer: Self-pay

## 2021-06-05 DIAGNOSIS — C50412 Malignant neoplasm of upper-outer quadrant of left female breast: Secondary | ICD-10-CM

## 2021-06-05 DIAGNOSIS — R2231 Localized swelling, mass and lump, right upper limb: Secondary | ICD-10-CM

## 2021-06-05 DIAGNOSIS — Z170 Estrogen receptor positive status [ER+]: Secondary | ICD-10-CM

## 2021-06-05 MED FILL — Celecoxib Cap 100 MG: ORAL | 90 days supply | Qty: 90 | Fill #0 | Status: AC

## 2021-06-18 ENCOUNTER — Telehealth: Payer: Self-pay | Admitting: Oncology

## 2021-06-18 NOTE — Telephone Encounter (Signed)
Reschedule appointment per provider. Left message.

## 2021-06-26 ENCOUNTER — Encounter: Payer: 59 | Admitting: Physician Assistant

## 2021-08-10 ENCOUNTER — Encounter: Payer: 59 | Admitting: Family Medicine

## 2021-08-11 ENCOUNTER — Other Ambulatory Visit: Payer: 59

## 2021-08-11 ENCOUNTER — Ambulatory Visit: Payer: 59 | Admitting: Oncology

## 2021-08-19 NOTE — Progress Notes (Signed)
Nancy Austin  [phone] [phone]     ID: Nancy Austin DOB: Jan 13, 1976  MR#: [id number]  MVH#:[id number]  Patient Care Team: Allwardt, Nancy Evens, PA-C as PCP - General (Physician Assistant) Nancy Margarita, MD as PCP - Cardiology (Cardiology) Rolm Bookbinder, MD as Consulting Physician (General Surgery) Nancy Pray, MD as Consulting Physician (Radiation Oncology) Cherise Fedder, Nancy Dad, MD as Consulting Physician (Oncology) Nancy Compton, MD as Consulting Physician (Obstetrics and Gynecology) Nancy Duos, MD as Consulting Physician (Rheumatology) Armbruster, Nancy Raspberry, MD as Consulting Physician (Gastroenterology) OTHER MD:   CHIEF COMPLAINT: Recurrent estrogen receptor positive breast cancer (s/p bilateral mastectomies)  CURRENT TREATMENT:  tamoxifen   INTERVAL HISTORY: Larsen returns today for follow-up of her left sided breast cancers.   She continues on tamoxifen.  S she has some daytime hot flashes which are not a major problem for her.  The nighttime hot flashes are well controlled on gabapentin.  Since her last visit, she underwent follow upright breast ultrasound on 08/26/2020 showing: benign focal area relatively hypoechoic tissue right breast at 9:30, adjacent to implant, presumably scar tissue or previous fat necrosis, has remained stable for 2 years.  She also underwent right axilla ultrasound on 06/05/21 for a palpable abnormality showing no evidence of malignancy.   REVIEW OF SYSTEMS: Nancy Austin tells me as she got up in the middle of the night in February, fell and hit her head.  She had a head CT which showed no bleed or other abnormalities and she tells me she had a Holter for 30 days which also showed no abnormalities.  She has had no other syncopal symptoms or episodes.  She thinks she might have been a bit dehydrated that time.  She exercises by running and walking.  She participated in our pelvic rehab program and tells  me she significantly benefited from it.  Aside from those issues a detailed review of systems today was stable Ria Comment    COVID 19 VACCINATION STATUS: [place] x2, no boosters as of September 2022   BREAST CANCER HISTORY: From the original Intake note:  Nancy Austin herself found a lump in her left breast early October 2015 and brought it to her gynecologist attention. On 09/20/2014 she was set up for bilateral diagnostic mammography and left breast ultrasonography of the breast Center. This was the patient's first ever mammogram. In the area of concern in the left breast there was suspicious pleomorphic calcifications spanning 1.1 cm. There was no discrete mammographic mass in this dense breasts (category C.). On physical exam, there was a palpable firm at 1.5 cm mass at the 3:00 position in the left breast. By ultrasound this was irregular and hypoechoic and measured 1.8 cm. Ultrasound of the left axilla was unremarkable. Aside from multiple cysts in the left breast there were no other findings of concern.  On the same day, 09/20/2014, the patient underwent biopsy of the left breast palpable mass. This showed ([id number]) an invasive ductal carcinoma, grade 2, estrogen receptor 100% positive, progesterone receptor 89% positive, both with strong staining intensity, with an MIB-1 of 16%. HER-2 was amplified with a signals ratio of 5.07 and a copy number per cell of 6.85.   On 09/30/2014 the patient underwent bilateral breast MRIs. This showed a 1.7 cm mass in the upper outer quadrant of the left breast. There was no other suspicious finding in either breast and no abnormal appearing adenopathy.  The patient's subsequent history is as detailed below   PAST MEDICAL  HISTORY: Past Medical History:  Diagnosis Date   Allergy    Anemia    Benign skin lesion of forehead 10/2015   Breast cancer ([place])    Cancer ([place])    History of breast cancer 09/2014   left   History of chemotherapy    finished  02/10/2015   History of radiation therapy 04/16/2015 - 06/02/2015   Palpitations    5 beats run of PAT noted on event monitor   PAT (paroxysmal atrial tachycardia) ([facility])    1 episode of 5 beat run of PAT on event monitor>>asymptomatic   Personal history of chemotherapy    Personal history of radiation therapy    Vasovagal syncope     PAST SURGICAL HISTORY: Past Surgical History:  Procedure Laterality Date   AUGMENTATION MAMMAPLASTY Bilateral    BREAST LUMPECTOMY     BREAST RECONSTRUCTION WITH PLACEMENT OF TISSUE EXPANDER AND FLEX HD (ACELLULAR HYDRATED DERMIS) Right 09/26/2017   Procedure: RIGHT BREAST RECONSTRUCTION WITH PLACEMENT OF TISSUE EXPANDER AND FLEX HD (ACELLULAR HYDRATED DERMIS);  Surgeon: Irene Limbo, MD;  Location: [facility];  Service: Plastics;  Laterality: Right;   LAPAROSCOPIC OOPHERECTOMY Bilateral    LAPAROSCOPIC SALPINGO OOPHERECTOMY Bilateral 11/12/2015   Procedure: LAPAROSCOPIC BILATERAL SALPINGO OOPHORECTOMY;  Surgeon: Nancy Compton, MD;  [facility];  Service: Gynecology;  Laterality: Bilateral;   LATISSIMUS FLAP TO BREAST Left 09/26/2017   Procedure: LATISSIMUS FLAP TO LEFT BREAST;  Surgeon: Irene Limbo, MD;  Location: [facility];  Service: Plastics;  Laterality: Left;   LESION REMOVAL Left 10/22/2015   Procedure: EXCISION OF FOREHEAD LESION 1X1CM;  Surgeon: Rolm Bookbinder, MD;  Location: [facility];  Service: General;  Laterality: Left;   MASTECTOMY Bilateral    NIPPLE SPARING MASTECTOMY Bilateral 09/26/2017   Procedure: LEFT NIPPLE SPARING MASTECTOMY, RIGHT PROPHYLACTIC NIPPLE SPARING MASTECTOMY;  Surgeon: Rolm Bookbinder, MD;  Location: [facility];  Service: General;  Laterality: Bilateral;   PORT-A-CATH REMOVAL N/A 10/22/2015   Procedure: REMOVAL PORT-A-CATH;  Surgeon: Rolm Bookbinder, MD;  Location: [facility];  Service: General;  Laterality: N/A;   PORTACATH PLACEMENT N/A 10/15/2014   Procedure: INSERTION PORT-A-CATH;   Surgeon: Rolm Bookbinder, MD;  Location: [facility];  Service: General;  Laterality: N/A;   RADIOACTIVE SEED GUIDED EXCISIONAL BREAST BIOPSY Left 08/17/2017   Procedure: LEFT RADIOACTIVE SEED GUIDED EXCISIONAL BREAST BIOPSY ERAS PATHWAY;  Surgeon: Rolm Bookbinder, MD;  Location: [facility];  Service: General;  Laterality: Left;   RADIOACTIVE SEED GUIDED PARTIAL MASTECTOMY WITH AXILLARY SENTINEL LYMPH NODE BIOPSY Left 03/06/2015   Procedure: RADIOACTIVE SEED GUIDED PARTIAL MASTECTOMY WITH AXILLARY SENTINEL LYMPH NODE BIOPSY;  Surgeon: Rolm Bookbinder, MD;  Location: [facility];  Service: General;  Laterality: Left;   REMOVAL OF BILATERAL TISSUE EXPANDERS WITH PLACEMENT OF BILATERAL BREAST IMPLANTS Bilateral 01/06/2018   Procedure: REMOVAL OF BILATERAL TISSUE EXPANDERS WITH PLACEMENT OF BILATERAL BREAST IMPLANTS;  Surgeon: Irene Limbo, MD;  Location: [facility];  Service: Plastics;  Laterality: Bilateral;   TONSILLECTOMY  1996    FAMILY HISTORY: Family History  Problem Relation Age of Onset   Skin cancer Father    Hyperlipidemia Father    Prostate cancer Father    Prostate cancer Maternal Uncle 65       currently 6   Multiple myeloma Paternal Grandmother 77       Deceased 49   Cancer Paternal Grandmother        bone   Prostate cancer Paternal Uncle  Lung disease Maternal Grandmother    AAA (abdominal aortic aneurysm) Maternal Grandmother    Stroke Maternal Grandfather    Colon cancer Neg Hx    Esophageal cancer Neg Hx    Rectal cancer Neg Hx    Stomach cancer Neg Hx   The patient's parents are both living. The patient has one brother, no sisters. One grandmother was diagnosed with multiple myeloma at age 36. There is no history of breast or ovarian cancer in the family.    GYNECOLOGIC HISTORY:  Patient's last menstrual period was 02/22/2015.  Menarche age 51, first live birth age 72. The patient is GX P2. She was still having  regular periods at the start of chemotherapy. She was on birth control pills on and off for the last 15 years, stopping in October of 2015.    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 Her husband Rolla Plate works as a Immunologist at [employer]. Their children are aged 72, and 108 as of August 2022   ADVANCED DIRECTIVES: In place   HEALTH MAINTENANCE: Social History   Tobacco Use   Smoking status: Never   Smokeless tobacco: Never  Vaping Use   Vaping Use: Never used  Substance Use Topics   Alcohol use: Yes    Comment: 3 x/week   Drug use: No    Colonoscopy:  PAP:    Bone density:  Lipid panel:  Allergies  Allergen Reactions   Chlorhexidine Itching and Rash   Morphine And Related Nausea And Vomiting   Other Rash    Dermabond   Tegaderm Ag Mesh [Silver] Rash    Reports that the larger tegaderm causes rashes    Current Outpatient Medications  Medication Sig Dispense Refill   celecoxib (CELEBREX) 100 MG capsule TAKE 1 CAPSULE BY MOUTH ONCE DAILY WITH FOOD 90 capsule 3   gabapentin (NEURONTIN) 300 MG capsule Take at bedtime 90 capsule 4   Multiple Vitamin (MULTI-VITAMINS) TABS Take by mouth.     tamoxifen (NOLVADEX) 20 MG tablet Take 1 tablet (20 mg total) by mouth daily. 90 tablet 4   No current facility-administered medications for this visit.    OBJECTIVE: White woman who appears stated age  Vitals:   08/20/21 0936  BP: 115/72  Pulse: 80  Resp: 16  Temp: (!) 97.3 F (36.3 C)  SpO2: 100%    Wt Readings from Last 3 Encounters:  08/20/21 126 lb 4.8 oz (57.3 kg)  04/07/21 123 lb 6.4 oz (56 kg)  02/19/21 120 lb 12.8 oz (54.8 kg)   Body mass index is 20.39 kg/m.    ECOG FS:1 - Symptomatic but completely ambulatory  Sclerae unicteric, EOMs intact Wearing a mask No cervical or supraclavicular adenopathy Lungs no rales or rhonchi Heart regular rate and rhythm Abd soft, nontender, positive bowel sounds MSK no focal spinal  tenderness, no upper extremity lymphedema Neuro: nonfocal, well oriented, appropriate affect Breasts: Status post bilateral mastectomies with bilateral implant reconstruction.  There is no evidence of disease recurrence.  Both axillae are benign.   LAB RESULTS:  CMP     Component Value Date/Time   NA 139 08/12/2020 0928   NA 141 08/02/2017 1536   K 4.0 08/12/2020 0928   K 4.0 08/02/2017 1536   CL 104 08/12/2020 0928   CO2 28 08/12/2020 0928   CO2 32 (H) 08/02/2017 1536   GLUCOSE 92 08/12/2020 0928   GLUCOSE 93 08/02/2017 1536   BUN 11 08/12/2020  0928   BUN 11.4 08/02/2017 1536   CREATININE 0.75 08/12/2020 0928   CREATININE 0.8 08/02/2017 1536   CALCIUM 10.1 08/12/2020 0928   CALCIUM 9.8 08/02/2017 1536   PROT 7.0 08/12/2020 0928   PROT 6.7 08/02/2017 1536   ALBUMIN 4.1 08/12/2020 0928   ALBUMIN 3.9 08/02/2017 1536   AST 23 08/12/2020 0928   AST 22 08/02/2017 1536   ALT 14 08/12/2020 0928   ALT 12 08/02/2017 1536   ALKPHOS 47 08/12/2020 0928   ALKPHOS 51 08/02/2017 1536   BILITOT 0.4 08/12/2020 0928   BILITOT 0.31 08/02/2017 1536   GFRNONAA >60 08/12/2020 0928   GFRAA >60 08/12/2020 0928    I No results found for: SPEP  Lab Results  Component Value Date   WBC 2.8 (L) 08/20/2021   NEUTROABS 1.4 (L) 08/20/2021   HGB 12.6 08/20/2021   HCT 38.6 08/20/2021   MCV 90.6 08/20/2021   PLT 201 08/20/2021      Chemistry      Component Value Date/Time   NA 139 08/12/2020 0928   NA 141 08/02/2017 1536   K 4.0 08/12/2020 0928   K 4.0 08/02/2017 1536   CL 104 08/12/2020 0928   CO2 28 08/12/2020 0928   CO2 32 (H) 08/02/2017 1536   BUN 11 08/12/2020 0928   BUN 11.4 08/02/2017 1536   CREATININE 0.75 08/12/2020 0928   CREATININE 0.8 08/02/2017 1536      Component Value Date/Time   CALCIUM 10.1 08/12/2020 0928   CALCIUM 9.8 08/02/2017 1536   ALKPHOS 47 08/12/2020 0928   ALKPHOS 51 08/02/2017 1536   AST 23 08/12/2020 0928   AST 22 08/02/2017 1536   ALT 14  08/12/2020 0928   ALT 12 08/02/2017 1536   BILITOT 0.4 08/12/2020 0928   BILITOT 0.31 08/02/2017 1536       No results found for: LABCA2  No components found for: [id number]  No results for input(s): INR in the last 168 hours.  Urinalysis No results found for: COLORURINE   STUDIES: No results found.    ASSESSMENT: 45 y.o. BRCA negative  woman s/p Left breast upper outer quadrant biopsy 09/20/2014, for a clinical T1c N0, stage IA invasive ductal carcinoma, grade 2, estrogen and progesterone receptor positive, HER-2 amplified with a signal is ratio of 5.07, and an MIB-1 of 16%  (1) neoadjuvant treatment consisting of carboplatin, docetaxel, trastuzumab and pertuzumab given every 21 days x6,  completed 02/10/2015  (a) breast MRI 02/11/2015 showed a complete radiologic response  (2) left lumpectomy and sentinel lymph node sampling 03/06/2015 showed a residual ypT1c ypN1a, stage IIA invasive ductal carcinoma, grade 1, with repeat prognostic panel still triple positive, and negative margins  (3) trastuzumab continued to total one year (last dose 10/01/2015)  (a) final echo 08/07/2015 finds an ejection fraction of 55-60%.  (4) adjuvant radiation 04/16/2015-06/02/2015 Left breast 50.4 gray in 28 fractions, axillary/supraclavicular region 45 gray in 25 fractions, lumpectomy boost 10 gray in 5 fractions  (5) started tamoxifen 06/26/2015  (a) goserelin started 04/03/2015, last dose 10/06/2015  (b) laparoscopic bilateral salpingo-oophorectomy 11/12/2015 with benign pathology  (6)  the [employer] geneprofile  ([facility] [employer]) obtained November 2015 showed no deleterious mutations in ATM, BARD1, BRCA1, BRCA BRIP1, CDH1, CHEK2, MRE11A, MUTYH, NBN, NF1, PALB2, PTEN, RAD50, RAD51C, RAD51D, or TP53  (7) left lumpectomy 08/17/2017 showed ductal carcinoma in situ, grade 2, estrogen and progesterone receptor negative, with close though negative margins  (8) bilateral mastectomies  09/26/2017 showed  (a)  on the right, no malignancy  (b) on the left, ductal carcinoma in situ, grade 2, with a close but negative anterior margin (skin)  (9) iron deficiency anemia: Resolved  (10) benign leukopenia: dates back to her chemotherapy; stable; no intercurrnt infections  PLAN: Jessabelle is now just about 4 years out from definitive surgery for her noninvasive breast cancer.  She is 6-1/2 years from surgery for her invasive breast cancer.  There is no evidence of disease recurrence.  We are continuing tamoxifen to a maximum of 10years.  She is tolerating this well.  Today we discussed Estrace cream for vaginal dryness and venlafaxine for hot flashes and she understands the possible benefits but really does not feel there is any need to intervene at this point  I reviewed her white cell count for her.  She understands she had a normal white cell count until her chemotherapy and since that time it has been between 2.0 and 3.0, very stable, with a normal hemoglobin and platelet count, and with no intercurrent infections.  This requires no further evaluation.  I refilled her tamoxifen and gabapentin.  She will return to see [place] in 1 year.  She knows to call for any other issue that may develop before then  Total encounter time 20 minutes.*   Kendell Sagraves, Nancy Dad, MD  08/20/21 10:06 AM Medical Oncology and Hematology [facility] [place], [place] [address] Tel. [phone]    Fax. [phone]   I, Wilburn Mylar, am acting as scribe for Dr. Virgie Austin. Donnamarie Shankles.  I, Lurline Del MD, have reviewed the above documentation for accuracy and completeness, and I agree with the above.   *Total Encounter Time as defined by the Centers for Medicare and Medicaid Services includes, in addition to the face-to-face time of a patient visit (documented in the note above) non-face-to-face time: obtaining and reviewing outside history, ordering and reviewing  medications, tests or procedures, care coordination (communications with other health care professionals or caregivers) and documentation in the medical record.

## 2021-08-20 ENCOUNTER — Other Ambulatory Visit: Payer: Self-pay

## 2021-08-20 ENCOUNTER — Inpatient Hospital Stay: Payer: 59 | Attending: Oncology

## 2021-08-20 ENCOUNTER — Other Ambulatory Visit (HOSPITAL_COMMUNITY): Payer: Self-pay

## 2021-08-20 ENCOUNTER — Inpatient Hospital Stay (HOSPITAL_BASED_OUTPATIENT_CLINIC_OR_DEPARTMENT_OTHER): Payer: 59 | Admitting: Oncology

## 2021-08-20 VITALS — BP 115/72 | HR 80 | Temp 97.3°F | Resp 16 | Ht 66.0 in | Wt 126.3 lb

## 2021-08-20 DIAGNOSIS — D708 Other neutropenia: Secondary | ICD-10-CM

## 2021-08-20 DIAGNOSIS — Z79810 Long term (current) use of selective estrogen receptor modulators (SERMs): Secondary | ICD-10-CM | POA: Insufficient documentation

## 2021-08-20 DIAGNOSIS — D0512 Intraductal carcinoma in situ of left breast: Secondary | ICD-10-CM

## 2021-08-20 DIAGNOSIS — Z923 Personal history of irradiation: Secondary | ICD-10-CM | POA: Insufficient documentation

## 2021-08-20 DIAGNOSIS — Z170 Estrogen receptor positive status [ER+]: Secondary | ICD-10-CM | POA: Insufficient documentation

## 2021-08-20 DIAGNOSIS — Z9013 Acquired absence of bilateral breasts and nipples: Secondary | ICD-10-CM | POA: Insufficient documentation

## 2021-08-20 DIAGNOSIS — C50412 Malignant neoplasm of upper-outer quadrant of left female breast: Secondary | ICD-10-CM | POA: Diagnosis not present

## 2021-08-20 DIAGNOSIS — Z9221 Personal history of antineoplastic chemotherapy: Secondary | ICD-10-CM | POA: Diagnosis not present

## 2021-08-20 LAB — COMPREHENSIVE METABOLIC PANEL
ALT: 13 U/L (ref 0–44)
AST: 21 U/L (ref 15–41)
Albumin: 4 g/dL (ref 3.5–5.0)
Alkaline Phosphatase: 41 U/L (ref 38–126)
Anion gap: 8 (ref 5–15)
BUN: 10 mg/dL (ref 6–20)
CO2: 27 mmol/L (ref 22–32)
Calcium: 9.2 mg/dL (ref 8.9–10.3)
Chloride: 107 mmol/L (ref 98–111)
Creatinine, Ser: 0.79 mg/dL (ref 0.44–1.00)
GFR, Estimated: >60 mL/min (ref >60)
Glucose, Bld: 82 mg/dL (ref 70–99)
Potassium: 3.8 mmol/L (ref 3.5–5.1)
Sodium: 142 mmol/L (ref 135–145)
Total Bilirubin: 0.7 mg/dL (ref 0.3–1.2)
Total Protein: 6.5 g/dL (ref 6.5–8.1)

## 2021-08-20 LAB — CBC WITH DIFFERENTIAL/PLATELET
Abs Immature Granulocytes: 0 10*3/uL (ref 0.00–0.07)
Basophils Absolute: 0 10*3/uL (ref 0.0–0.1)
Basophils Relative: 1 %
Eosinophils Absolute: 0 10*3/uL (ref 0.0–0.5)
Eosinophils Relative: 1 %
HCT: 38.6 % (ref 36.0–46.0)
Hemoglobin: 12.6 g/dL (ref 12.0–15.0)
Immature Granulocytes: 0 %
Lymphocytes Relative: 41 %
Lymphs Abs: 1.2 10*3/uL (ref 0.7–4.0)
MCH: 29.6 pg (ref 26.0–34.0)
MCHC: 32.6 g/dL (ref 30.0–36.0)
MCV: 90.6 fL (ref 80.0–100.0)
Monocytes Absolute: 0.2 10*3/uL (ref 0.1–1.0)
Monocytes Relative: 7 %
Neutro Abs: 1.4 10*3/uL — ABNORMAL LOW (ref 1.7–7.7)
Neutrophils Relative %: 50 %
Platelets: 201 10*3/uL (ref 150–400)
RBC: 4.26 MIL/uL (ref 3.87–5.11)
RDW: 12.6 % (ref 11.5–15.5)
WBC: 2.8 10*3/uL — ABNORMAL LOW (ref 4.0–10.5)
nRBC: 0 % (ref 0.0–0.2)

## 2021-08-20 MED ORDER — GABAPENTIN 300 MG PO CAPS
ORAL_CAPSULE | ORAL | 4 refills | Status: DC
  Filled 2021-08-20: qty 90, 90d supply, fill #0

## 2021-08-20 MED ORDER — TAMOXIFEN CITRATE 20 MG PO TABS
20.0000 mg | ORAL_TABLET | Freq: Every day | ORAL | 4 refills | Status: DC
  Filled 2021-08-20: qty 90, 90d supply, fill #0

## 2021-09-16 DIAGNOSIS — Z853 Personal history of malignant neoplasm of breast: Secondary | ICD-10-CM | POA: Diagnosis not present

## 2021-09-16 DIAGNOSIS — Z923 Personal history of irradiation: Secondary | ICD-10-CM | POA: Diagnosis not present

## 2021-09-16 DIAGNOSIS — Z9013 Acquired absence of bilateral breasts and nipples: Secondary | ICD-10-CM | POA: Diagnosis not present

## 2021-09-22 ENCOUNTER — Other Ambulatory Visit (HOSPITAL_COMMUNITY): Payer: Self-pay

## 2021-09-22 MED FILL — Celecoxib Cap 100 MG: ORAL | 90 days supply | Qty: 90 | Fill #1 | Status: AC

## 2021-10-01 ENCOUNTER — Encounter: Payer: 59 | Admitting: Physician Assistant

## 2021-10-15 ENCOUNTER — Other Ambulatory Visit (HOSPITAL_COMMUNITY): Payer: Self-pay

## 2021-10-15 DIAGNOSIS — C50912 Malignant neoplasm of unspecified site of left female breast: Secondary | ICD-10-CM | POA: Diagnosis not present

## 2021-10-15 DIAGNOSIS — M2550 Pain in unspecified joint: Secondary | ICD-10-CM | POA: Diagnosis not present

## 2021-10-15 DIAGNOSIS — M150 Primary generalized (osteo)arthritis: Secondary | ICD-10-CM | POA: Diagnosis not present

## 2021-10-15 DIAGNOSIS — Z6820 Body mass index (BMI) 20.0-20.9, adult: Secondary | ICD-10-CM | POA: Diagnosis not present

## 2021-10-15 DIAGNOSIS — L601 Onycholysis: Secondary | ICD-10-CM | POA: Diagnosis not present

## 2021-10-15 MED ORDER — CELECOXIB 100 MG PO CAPS
ORAL_CAPSULE | ORAL | 3 refills | Status: DC
  Filled 2021-10-15 – 2022-02-09 (×2): qty 90, 90d supply, fill #0

## 2021-11-13 ENCOUNTER — Other Ambulatory Visit: Payer: Self-pay | Admitting: Oncology

## 2021-11-13 DIAGNOSIS — Z170 Estrogen receptor positive status [ER+]: Secondary | ICD-10-CM

## 2021-11-13 DIAGNOSIS — C50412 Malignant neoplasm of upper-outer quadrant of left female breast: Secondary | ICD-10-CM

## 2021-12-17 ENCOUNTER — Telehealth: Payer: Self-pay | Admitting: Gastroenterology

## 2021-12-17 NOTE — Telephone Encounter (Signed)
Left pt a detailed vm letting her know that it has been 3 years since she has been seen. Advised that pt will need to be scheduled for a new patient appt with Dr. Havery Moros or APP for evaluation.

## 2021-12-17 NOTE — Telephone Encounter (Signed)
Patient called is having some rectal bleeding seeking advise.

## 2022-02-09 ENCOUNTER — Other Ambulatory Visit (HOSPITAL_COMMUNITY): Payer: Self-pay

## 2022-02-18 ENCOUNTER — Encounter: Payer: 59 | Admitting: Physician Assistant

## 2022-02-19 ENCOUNTER — Ambulatory Visit (INDEPENDENT_AMBULATORY_CARE_PROVIDER_SITE_OTHER): Payer: 59 | Admitting: Physician Assistant

## 2022-02-19 ENCOUNTER — Encounter: Payer: Self-pay | Admitting: Physician Assistant

## 2022-02-19 VITALS — BP 115/77 | HR 44 | Temp 97.7°F | Resp 16 | Ht 65.5 in | Wt 125.6 lb

## 2022-02-19 DIAGNOSIS — R790 Abnormal level of blood mineral: Secondary | ICD-10-CM

## 2022-02-19 DIAGNOSIS — Z170 Estrogen receptor positive status [ER+]: Secondary | ICD-10-CM | POA: Diagnosis not present

## 2022-02-19 DIAGNOSIS — Z13220 Encounter for screening for lipoid disorders: Secondary | ICD-10-CM

## 2022-02-19 DIAGNOSIS — Z0000 Encounter for general adult medical examination without abnormal findings: Secondary | ICD-10-CM

## 2022-02-19 DIAGNOSIS — R519 Headache, unspecified: Secondary | ICD-10-CM | POA: Diagnosis not present

## 2022-02-19 DIAGNOSIS — E559 Vitamin D deficiency, unspecified: Secondary | ICD-10-CM | POA: Diagnosis not present

## 2022-02-19 DIAGNOSIS — C50412 Malignant neoplasm of upper-outer quadrant of left female breast: Secondary | ICD-10-CM

## 2022-02-19 LAB — LIPID PANEL
Cholesterol: 132 mg/dL (ref 0–200)
HDL: 56.7 mg/dL (ref >39.00)
LDL Cholesterol: 62 mg/dL (ref 0–99)
NonHDL: 74.88
Total CHOL/HDL Ratio: 2
Triglycerides: 62 mg/dL (ref 0.0–149.0)
VLDL: 12.4 mg/dL (ref 0.0–40.0)

## 2022-02-19 LAB — CBC WITH DIFFERENTIAL/PLATELET
Basophils Absolute: 0 10*3/uL (ref 0.0–0.1)
Basophils Relative: 0.9 % (ref 0.0–3.0)
Eosinophils Absolute: 0 10*3/uL (ref 0.0–0.7)
Eosinophils Relative: 0.9 % (ref 0.0–5.0)
HCT: 40 % (ref 36.0–46.0)
Hemoglobin: 13 g/dL (ref 12.0–15.0)
Lymphocytes Relative: 41.9 % (ref 12.0–46.0)
Lymphs Abs: 1.1 10*3/uL (ref 0.7–4.0)
MCHC: 32.5 g/dL (ref 30.0–36.0)
MCV: 88.2 fl (ref 78.0–100.0)
Monocytes Absolute: 0.2 10*3/uL (ref 0.1–1.0)
Monocytes Relative: 7 % (ref 3.0–12.0)
Neutro Abs: 1.4 10*3/uL (ref 1.4–7.7)
Neutrophils Relative %: 49.3 % (ref 43.0–77.0)
Platelets: 201 10*3/uL (ref 150.0–400.0)
RBC: 4.53 Mil/uL (ref 3.87–5.11)
RDW: 13.1 % (ref 11.5–15.5)
WBC: 2.7 10*3/uL — ABNORMAL LOW (ref 4.0–10.5)

## 2022-02-19 LAB — VITAMIN B12: Vitamin B-12: 410 pg/mL (ref 211–911)

## 2022-02-19 LAB — COMPREHENSIVE METABOLIC PANEL
ALT: 13 U/L (ref 0–35)
AST: 23 U/L (ref 0–37)
Albumin: 4.4 g/dL (ref 3.5–5.2)
Alkaline Phosphatase: 39 U/L (ref 39–117)
BUN: 13 mg/dL (ref 6–23)
CO2: 29 mEq/L (ref 19–32)
Calcium: 9.4 mg/dL (ref 8.4–10.5)
Chloride: 103 mEq/L (ref 96–112)
Creatinine, Ser: 0.71 mg/dL (ref 0.40–1.20)
GFR: 102.17 mL/min (ref >60.00)
Glucose, Bld: 84 mg/dL (ref 70–99)
Potassium: 4.5 mEq/L (ref 3.5–5.1)
Sodium: 140 mEq/L (ref 135–145)
Total Bilirubin: 0.6 mg/dL (ref 0.2–1.2)
Total Protein: 6.5 g/dL (ref 6.0–8.3)

## 2022-02-19 LAB — VITAMIN D 25 HYDROXY (VIT D DEFICIENCY, FRACTURES): VITD: 32.44 ng/mL (ref 30.00–100.00)

## 2022-02-19 NOTE — Progress Notes (Signed)
Subjective:    Patient ID: Nancy Austin, female    DOB: Jul 31, 1976, 46 y.o.   MRN: [id number]  Chief Complaint  Patient presents with   Annual Exam    Fasting    Headache    Tension headaches 4 days a week over the past few months  Did stop Gabapentin several months ago     HPI Patient is in today for annual exam.  Acute concerns: Patient complains of tension headaches usually about 4 days a week over the last few months.  States that she stopped taking gabapentin this a few months ago and wonders if this has something to do with it.  Usually uses cool packs on her forehead or Tylenol if needed.  Health maintenance: Lifestyle/ exercise: Running, walking, weights  Nutrition: Cooks at home Mental health: No concerns Caffeine: Some coffee in the mornings Sleep: Sometimes restless  Substance use: None Sexual activity: Monogamous Immunizations: UTD on Tdap Colonoscopy: Had a scope in 01/11/2019 - normal, ok for 10 years  Pap: UTD sees GYN Mammogram: Hx mastectomy - sees oncology once per year    Past Medical History:  Diagnosis Date   Allergy    Anemia    Benign skin lesion of forehead 10/2015   Breast cancer ([place])    Cancer ([place])    History of breast cancer 09/2014   left   History of chemotherapy    finished 02/10/2015   History of radiation therapy 04/16/2015 - 06/02/2015   Palpitations    5 beats run of PAT noted on event monitor   PAT (paroxysmal atrial tachycardia) (HCC)    1 episode of 5 beat run of PAT on event monitor>>asymptomatic   Personal history of chemotherapy    Personal history of radiation therapy    Vasovagal syncope     Past Surgical History:  Procedure Laterality Date   AUGMENTATION MAMMAPLASTY Bilateral    BREAST LUMPECTOMY     BREAST RECONSTRUCTION WITH PLACEMENT OF TISSUE EXPANDER AND FLEX HD (ACELLULAR HYDRATED DERMIS) Right 09/26/2017   Procedure: RIGHT BREAST RECONSTRUCTION WITH PLACEMENT OF TISSUE EXPANDER AND FLEX HD (ACELLULAR HYDRATED  DERMIS);  Surgeon: Irene Limbo, MD;  Location: [facility];  Service: Plastics;  Laterality: Right;   LAPAROSCOPIC OOPHERECTOMY Bilateral    LAPAROSCOPIC SALPINGO OOPHERECTOMY Bilateral 11/12/2015   Procedure: LAPAROSCOPIC BILATERAL SALPINGO OOPHORECTOMY;  Surgeon: Paula Compton, MD;  Location: [facility];  Service: Gynecology;  Laterality: Bilateral;   LATISSIMUS FLAP TO BREAST Left 09/26/2017   Procedure: LATISSIMUS FLAP TO LEFT BREAST;  Surgeon: Irene Limbo, MD;  Location: [facility];  Service: Plastics;  Laterality: Left;   LESION REMOVAL Left 10/22/2015   Procedure: EXCISION OF FOREHEAD LESION 1X1CM;  Surgeon: Rolm Bookbinder, MD;  Location: [facility];  Service: General;  Laterality: Left;   MASTECTOMY Bilateral    NIPPLE SPARING MASTECTOMY Bilateral 09/26/2017   Procedure: LEFT NIPPLE SPARING MASTECTOMY, RIGHT PROPHYLACTIC NIPPLE SPARING MASTECTOMY;  Surgeon: Rolm Bookbinder, MD;  Location: [facility];  Service: General;  Laterality: Bilateral;   PORT-A-CATH REMOVAL N/A 10/22/2015   Procedure: REMOVAL PORT-A-CATH;  Surgeon: Rolm Bookbinder, MD;  Location: [facility];  Service: General;  Laterality: N/A;   PORTACATH PLACEMENT N/A 10/15/2014   Procedure: INSERTION PORT-A-CATH;  Surgeon: Rolm Bookbinder, MD;  Location: [facility];  Service: General;  Laterality: N/A;   RADIOACTIVE SEED GUIDED EXCISIONAL BREAST BIOPSY Left 08/17/2017   Procedure: LEFT RADIOACTIVE SEED GUIDED EXCISIONAL BREAST BIOPSY ERAS PATHWAY;  Surgeon: Rolm Bookbinder,  Location: [facility];  Service: General;  Laterality: Left;  ° RADIOACTIVE SEED GUIDED PARTIAL MASTECTOMY WITH AXILLARY SENTINEL LYMPH NODE BIOPSY Left 03/06/2015  ° Procedure: RADIOACTIVE SEED GUIDED PARTIAL MASTECTOMY WITH AXILLARY SENTINEL LYMPH NODE BIOPSY;  Surgeon: Matthew Wakefield, MD;  Location: [facility];  Service: General;  Laterality: Left;  ° REMOVAL OF BILATERAL TISSUE EXPANDERS WITH  PLACEMENT OF BILATERAL BREAST IMPLANTS Bilateral 01/06/2018  ° Procedure: REMOVAL OF BILATERAL TISSUE EXPANDERS WITH PLACEMENT OF BILATERAL BREAST IMPLANTS;  Surgeon: Thimmappa, Brinda, MD;  Location: [facility];  Service: Plastics;  Laterality: Bilateral;  ° TONSILLECTOMY  1996  ° ° °Family History  °Problem Relation Age of Onset  ° Skin cancer Father   ° Hyperlipidemia Father   ° Prostate cancer Father   ° Prostate cancer Maternal Uncle 60  °     currently 61  ° Multiple myeloma Paternal Grandmother 80  °     Deceased 83  ° Cancer Paternal Grandmother   °     bone  ° Prostate cancer Paternal Uncle   ° Lung disease Maternal Grandmother   ° AAA (abdominal aortic aneurysm) Maternal Grandmother   ° Stroke Maternal Grandfather   ° Colon cancer Neg Hx   ° Esophageal cancer Neg Hx   ° Rectal cancer Neg Hx   ° Stomach cancer Neg Hx   ° ° °Social History  ° °Tobacco Use  ° Smoking status: Never  ° Smokeless tobacco: Never  °Vaping Use  ° Vaping Use: Never used  °Substance Use Topics  ° Alcohol use: Yes  °  Comment: 3 x/week  ° Drug use: No  °  ° °Allergies  °Allergen Reactions  ° Chlorhexidine Itching and Rash  ° Morphine And Related Nausea And Vomiting  ° Other Rash  °  Dermabond  ° Tegaderm Ag Mesh [Silver] Rash  °  Reports that the larger tegaderm causes rashes  ° ° °Review of Systems °NEGATIVE UNLESS OTHERWISE INDICATED IN HPI ° ° °   °Objective:  °  ° °BP 115/77    Pulse (!) 44    Temp 97.7 °F (36.5 °C) (Oral)    Resp 16    Ht 5' 5.5" (1.664 m)    Wt 125 lb 9.6 oz (57 kg)    LMP 02/22/2015    SpO2 99%    BMI 20.58 kg/m²  ° °Wt Readings from Last 3 Encounters:  °02/19/22 125 lb 9.6 oz (57 kg)  °08/20/21 126 lb 4.8 oz (57.3 kg)  °04/07/21 123 lb 6.4 oz (56 kg)  ° ° °BP Readings from Last 3 Encounters:  °02/19/22 115/77  °08/20/21 115/72  °04/07/21 98/60  °  ° °Physical Exam °Vitals and nursing note reviewed.  °Constitutional:   °   Appearance: Normal appearance. She is normal weight. She is not  toxic-appearing.  °HENT:  °   Head: Normocephalic and atraumatic.  °   Right Ear: Tympanic membrane, ear canal and external ear normal.  °   Left Ear: Tympanic membrane, ear canal and external ear normal.  °   Nose: Nose normal.  °   Mouth/Throat:  °   Mouth: Mucous membranes are moist.  °Eyes:  °   Extraocular Movements: Extraocular movements intact.  °   Conjunctiva/sclera: Conjunctivae normal.  °   Pupils: Pupils are equal, round, and reactive to light.  °Cardiovascular:  °   Rate and Rhythm: Normal rate and regular rhythm.  °   Pulses: Normal   Pulses: Normal pulses.     Heart sounds: Normal heart sounds.  Pulmonary:     Effort: Pulmonary effort is normal.     Breath sounds: Normal breath sounds.  Abdominal:     General: Abdomen is flat. Bowel sounds are normal.     Palpations: Abdomen is soft.  Musculoskeletal:        General: Normal range of motion.     Cervical back: Normal range of motion and neck supple.  Skin:    General: Skin is warm and dry.  Neurological:     General: No focal deficit present.     Mental Status: She is alert and oriented to person, place, and time.     Cranial Nerves: No cranial nerve deficit.     Gait: Gait normal.  Psychiatric:        Mood and Affect: Mood normal. Mood is not anxious or depressed.        Behavior: Behavior normal.        Thought Content: Thought content normal.        Judgment: Judgment normal.       Assessment & Plan:   Problem List Items Addressed This Visit       Other   Malignant neoplasm of upper-outer quadrant of left breast in female, estrogen receptor positive ([place])   Relevant Orders   CBC with Differential/Platelet   Comprehensive metabolic panel   Vitamin D deficiency   Relevant Orders   VITAMIN D 25 Hydroxy (Vit-D Deficiency, Fractures)   Other Visit Diagnoses     Encounter for annual physical exam    -  Primary   Relevant Orders   CBC with Differential/Platelet   Comprehensive metabolic panel   Iron, TIBC and Ferritin Panel    Lipid panel   Vitamin B12   VITAMIN D 25 Hydroxy (Vit-D Deficiency, Fractures)   Increased frequency of headaches       Relevant Orders   CBC with Differential/Platelet   Comprehensive metabolic panel   Iron, TIBC and Ferritin Panel   Vitamin B12   VITAMIN D 25 Hydroxy (Vit-D Deficiency, Fractures)   Decreased ferritin       Relevant Orders   Iron, TIBC and Ferritin Panel   Screening for cholesterol level       Relevant Orders   Lipid panel       Plan: -Age-appropriate screening and counseling performed today. Will check labs and call with results. Preventive measures discussed and printed in AVS for patient.  -Patient is doing well overall with healthy lifestyle and staying up-to-date on preventative topics -She has annual follow-up with oncology. -Discussed with patient new headaches could be a result of stopping the gabapentin.  Again we can check labs and make sure no underlying issues that could be contributing.  Also suggested she track headaches and reach out to oncologist and headache wellness center about these headaches. -No red flags on exam but she knows to present to ER if any do present.   This note was prepared with assistance of Systems analyst. Occasional wrong-word or sound-a-like substitutions may have occurred due to the inherent limitations of voice recognition software.   Darrielle Pflieger M Yenesis Even, PA-C

## 2022-02-19 NOTE — Patient Instructions (Signed)
Good to see you today! ?Please go to the lab for blood work and I will send results through [facility]. ?Pending normal labs, contact oncology and [facility] about frequent headaches. Continue to track these at home. ? ?Keep up good work with your health. ? ?

## 2022-02-20 LAB — IRON,TIBC AND FERRITIN PANEL
%SAT: 16 % (calc) (ref 16–45)
Ferritin: 12 ng/mL — ABNORMAL LOW (ref 16–232)
Iron: 66 ug/dL (ref 40–190)
TIBC: 401 mcg/dL (calc) (ref 250–450)

## 2022-02-25 ENCOUNTER — Other Ambulatory Visit: Payer: Self-pay | Admitting: Physician Assistant

## 2022-02-25 ENCOUNTER — Encounter: Payer: Self-pay | Admitting: Physician Assistant

## 2022-02-25 ENCOUNTER — Other Ambulatory Visit (HOSPITAL_COMMUNITY): Payer: Self-pay

## 2022-02-25 DIAGNOSIS — H52223 Regular astigmatism, bilateral: Secondary | ICD-10-CM | POA: Diagnosis not present

## 2022-02-25 DIAGNOSIS — H524 Presbyopia: Secondary | ICD-10-CM | POA: Diagnosis not present

## 2022-02-25 MED ORDER — FERROUS SULFATE 325 (65 FE) MG PO TABS
325.0000 mg | ORAL_TABLET | ORAL | 3 refills | Status: DC
  Filled 2022-02-25: qty 30, 60d supply, fill #0

## 2022-02-26 ENCOUNTER — Other Ambulatory Visit (HOSPITAL_COMMUNITY): Payer: Self-pay

## 2022-03-31 DIAGNOSIS — L814 Other melanin hyperpigmentation: Secondary | ICD-10-CM | POA: Diagnosis not present

## 2022-03-31 DIAGNOSIS — L821 Other seborrheic keratosis: Secondary | ICD-10-CM | POA: Diagnosis not present

## 2022-03-31 DIAGNOSIS — L578 Other skin changes due to chronic exposure to nonionizing radiation: Secondary | ICD-10-CM | POA: Diagnosis not present

## 2022-03-31 DIAGNOSIS — D1801 Hemangioma of skin and subcutaneous tissue: Secondary | ICD-10-CM | POA: Diagnosis not present

## 2022-03-31 DIAGNOSIS — L738 Other specified follicular disorders: Secondary | ICD-10-CM | POA: Diagnosis not present

## 2022-03-31 DIAGNOSIS — D229 Melanocytic nevi, unspecified: Secondary | ICD-10-CM | POA: Diagnosis not present

## 2022-05-31 DIAGNOSIS — Z1389 Encounter for screening for other disorder: Secondary | ICD-10-CM | POA: Diagnosis not present

## 2022-05-31 DIAGNOSIS — N8500 Endometrial hyperplasia, unspecified: Secondary | ICD-10-CM | POA: Diagnosis not present

## 2022-05-31 DIAGNOSIS — Z01419 Encounter for gynecological examination (general) (routine) without abnormal findings: Secondary | ICD-10-CM | POA: Diagnosis not present

## 2022-06-01 ENCOUNTER — Other Ambulatory Visit: Payer: Self-pay

## 2022-06-01 ENCOUNTER — Telehealth: Payer: Self-pay | Admitting: Physician Assistant

## 2022-06-01 DIAGNOSIS — Z0000 Encounter for general adult medical examination without abnormal findings: Secondary | ICD-10-CM

## 2022-06-01 NOTE — Telephone Encounter (Signed)
Returned patients call and advised lab orders requested back in March were now placed and scheduled lab appt with patient.

## 2022-06-01 NOTE — Telephone Encounter (Signed)
Patient is calling in stating she was to have had labs ordered.   I do not see labs placed in order to schedule.   Please follow up with patient in regard.

## 2022-06-03 ENCOUNTER — Other Ambulatory Visit: Payer: Self-pay

## 2022-06-03 DIAGNOSIS — Z0000 Encounter for general adult medical examination without abnormal findings: Secondary | ICD-10-CM

## 2022-06-03 DIAGNOSIS — R790 Abnormal level of blood mineral: Secondary | ICD-10-CM

## 2022-06-04 ENCOUNTER — Other Ambulatory Visit: Payer: 59

## 2022-06-04 ENCOUNTER — Other Ambulatory Visit (INDEPENDENT_AMBULATORY_CARE_PROVIDER_SITE_OTHER): Payer: 59

## 2022-06-04 DIAGNOSIS — R790 Abnormal level of blood mineral: Secondary | ICD-10-CM

## 2022-06-04 DIAGNOSIS — Z170 Estrogen receptor positive status [ER+]: Secondary | ICD-10-CM | POA: Diagnosis not present

## 2022-06-04 DIAGNOSIS — Z0000 Encounter for general adult medical examination without abnormal findings: Secondary | ICD-10-CM

## 2022-06-04 DIAGNOSIS — D0512 Intraductal carcinoma in situ of left breast: Secondary | ICD-10-CM

## 2022-06-04 DIAGNOSIS — C50412 Malignant neoplasm of upper-outer quadrant of left female breast: Secondary | ICD-10-CM | POA: Diagnosis not present

## 2022-06-04 LAB — CBC WITH DIFFERENTIAL/PLATELET
Basophils Absolute: 0 10*3/uL (ref 0.0–0.1)
Basophils Relative: 0.5 % (ref 0.0–3.0)
Eosinophils Absolute: 0 10*3/uL (ref 0.0–0.7)
Eosinophils Relative: 1.1 % (ref 0.0–5.0)
HCT: 39.4 % (ref 36.0–46.0)
Hemoglobin: 12.9 g/dL (ref 12.0–15.0)
Lymphocytes Relative: 35.5 % (ref 12.0–46.0)
Lymphs Abs: 1.4 10*3/uL (ref 0.7–4.0)
MCHC: 32.7 g/dL (ref 30.0–36.0)
MCV: 87.7 fl (ref 78.0–100.0)
Monocytes Absolute: 0.2 10*3/uL (ref 0.1–1.0)
Monocytes Relative: 5.7 % (ref 3.0–12.0)
Neutro Abs: 2.3 10*3/uL (ref 1.4–7.7)
Neutrophils Relative %: 57.2 % (ref 43.0–77.0)
Platelets: 200 10*3/uL (ref 150.0–400.0)
RBC: 4.49 Mil/uL (ref 3.87–5.11)
RDW: 13.8 % (ref 11.5–15.5)
WBC: 4 10*3/uL (ref 4.0–10.5)

## 2022-06-04 LAB — IBC + FERRITIN
Ferritin: 17.4 ng/mL (ref 10.0–291.0)
Iron: 74 ug/dL (ref 42–145)
Saturation Ratios: 17.6 % — ABNORMAL LOW (ref 20.0–50.0)
TIBC: 420 ug/dL (ref 250.0–450.0)
Transferrin: 300 mg/dL (ref 212.0–360.0)

## 2022-08-02 ENCOUNTER — Telehealth: Payer: Self-pay | Admitting: Hematology and Oncology

## 2022-08-02 NOTE — Telephone Encounter (Signed)
Rescheduled appointment per provider PAL. Left voicemail. 

## 2022-08-20 ENCOUNTER — Other Ambulatory Visit: Payer: 59

## 2022-08-20 ENCOUNTER — Ambulatory Visit: Payer: 59 | Admitting: Hematology and Oncology

## 2022-08-26 NOTE — Progress Notes (Signed)
Patient Care Team: Allwardt, Randa Evens, PA-C as PCP - General (Physician Assistant) Sueanne Margarita, MD as PCP - Cardiology (Cardiology) Rolm Bookbinder, MD as Consulting Physician (General Surgery) Gery Pray, MD as Consulting Physician (Radiation Oncology) Magrinat, Virgie Dad, MD (Inactive) as Consulting Physician (Oncology) Paula Compton, MD as Consulting Physician (Obstetrics and Gynecology) Hennie Duos, MD as Consulting Physician (Rheumatology) Armbruster, Carlota Raspberry, MD as Consulting Physician (Gastroenterology)  DIAGNOSIS: No diagnosis found.  SUMMARY OF ONCOLOGIC HISTORY: Oncology History Overview Note  ,    Malignant neoplasm of upper-outer quadrant of left breast in female, estrogen receptor positive ([place])  09/20/2014 Mammogram   Left breast: suspicious pleomorphic calcifications that were evaluated with magnification views. These calcifications span 1.1 x 0.8 x 1.0 cm. There is a circumscribed oval mass in the 12 o'clock region of the left breast, separate from palpable mass.   09/20/2014 Breast US   Left breast: Suspicious mass with associated pleomorphic calcifications in the 3 o'clock position corresponds to the palpable mass. Findings are highly suggestive of malignancy. No evidence of malignancy in the right breast.    09/20/2014 Initial Biopsy   Left breast core needle bx: Invasive ductal carcinoma, ER+ (100%), PR+ (89%), HER2/neu positive (radio 5.07), Ki67 16%.   09/30/2014 Breast MRI   1.7 cm mass consistent with known malignancy in the upper-outer quadrant of the left breast. 2. No suspicious findings in the right breast.    09/30/2014 Clinical Stage   Stage IA: T1c N0   10/10/2014 Procedure   BreastNext panel reveals no clinically significant variant at ATM, BARD1, BRCA1, BRCA2, BRIP1, CDH1, CHEK2, MRE11A, MUTYH, NBN, NF1, PALB2, PTEN, RAD50, RAD51C, RAD51D, and TP53.   10/21/2014 - 09/23/2015 Neo-Adjuvant Chemotherapy   Carboplatin, docetaxel,  trastuzumab and pertuzumab given every 21 days x6,  completed 02/10/2015. Maintenance trastuzumab to complete one year of therapy   02/11/2015 Breast MRI   No residual enhancement at the site of biopsy proven left breast 3:00 location malignancy, indicating good response to treatment. No new area of abnormal enhancement in either breast.   03/06/2015 Definitive Surgery   Left lumpectomy/[facility]): Invasive ductal carcinoma, grade 1, spans 1.7 cm, DCIS, + LVI. ER+ (98%), PR+ (99%), HER2/neu positive (ratio 4.62).  4 LN removed, 1 positive for metastatic carcinoma (1/4)    03/06/2015 Pathologic Stage   Stage IIA: ypT1c ypN1a   04/16/2015 - 06/02/2015 Radiation Therapy   Adjuvant RT ([facility]): Left breast 50.4 gray in 28 fractions, axillary/supraclavicular region 45 gray in 25 fractions, lumpectomy boost 10 gray in 5 fractions   07/03/2015 -  Anti-estrogen oral therapy   Tamoxifen 20 mg daily. Planned duration of therapy 10 years.   12/17/2015 Survivorship   Survivorship care plan completed and mailed to patient in lieu of in person visit.     CHIEF COMPLIANT:   INTERVAL HISTORY: Nancy Austin is a   ALLERGIES:  is allergic to chlorhexidine, morphine and related, other, and tegaderm ag mesh [silver].  MEDICATIONS:  Current Outpatient Medications  Medication Sig Dispense Refill   celecoxib (CELEBREX) 100 MG capsule Take 1 capsule by mouth once daily with food 90 capsule 3   ferrous sulfate 325 (65 FE) MG tablet Take 1 tablet (325 mg total) by mouth every other day. 30 tablet 3   gabapentin (NEURONTIN) 300 MG capsule TAKE 1 CAPSULE BY MOUTH ONCE A DAY AT BEDTIME (Patient not taking: Reported on 02/19/2022) 90 capsule 4   Multiple Vitamin (MULTI-VITAMINS) TABS Take by mouth.  tamoxifen (NOLVADEX) 20 MG tablet TAKE 1 TABLET BY MOUTH ONCE A DAY 90 tablet 4   No current facility-administered medications for this visit.    PHYSICAL EXAMINATION: ECOG PERFORMANCE STATUS: {CHL ONC ECOG  PS:[id number]}  There were no vitals filed for this visit. There were no vitals filed for this visit.  BREAST:*** No palpable masses or nodules in either right or left breasts. No palpable axillary supraclavicular or infraclavicular adenopathy no breast tenderness or nipple discharge. (exam performed in the presence of a chaperone)  LABORATORY DATA:  I have reviewed the data as listed    Latest Ref Rng & Units 02/19/2022   11:00 AM 08/20/2021    9:25 AM 08/12/2020    9:28 AM  CMP  Glucose 70 - 99 mg/dL 84  82  92   BUN 6 - 23 mg/dL 13  10  11    Creatinine 0.40 - 1.20 mg/dL 0.71  0.79  0.75   Sodium 135 - 145 mEq/L 140  142  139   Potassium 3.5 - 5.1 mEq/L 4.5  3.8  4.0   Chloride 96 - 112 mEq/L 103  107  104   CO2 19 - 32 mEq/L 29  27  28    Calcium 8.4 - 10.5 mg/dL 9.4  9.2  10.1   Total Protein 6.0 - 8.3 g/dL 6.5  6.5  7.0   Total Bilirubin 0.2 - 1.2 mg/dL 0.6  0.7  0.4   Alkaline Phos 39 - 117 U/L 39  41  47   AST 0 - 37 U/L 23  21  23    ALT 0 - 35 U/L 13  13  14      Lab Results  Component Value Date   WBC 4.0 06/04/2022   HGB 12.9 06/04/2022   HCT 39.4 06/04/2022   MCV 87.7 06/04/2022   PLT 200.0 06/04/2022   NEUTROABS 2.3 06/04/2022    ASSESSMENT & PLAN:  No problem-specific Assessment & Plan notes found for this encounter.    No orders of the defined types were placed in this encounter.  The patient has a good understanding of the overall plan. she agrees with it. she will call with any problems that may develop before the next visit here. Total time spent: 30 mins including face to face time and time spent for planning, charting and co-ordination of care   Suzzette Righter, [place] 08/26/22    I Gardiner Coins am scribing for Dr. Lindi Adie  ***

## 2022-08-27 DIAGNOSIS — N840 Polyp of corpus uteri: Secondary | ICD-10-CM | POA: Diagnosis not present

## 2022-08-27 DIAGNOSIS — N950 Postmenopausal bleeding: Secondary | ICD-10-CM | POA: Diagnosis not present

## 2022-08-30 ENCOUNTER — Other Ambulatory Visit: Payer: Self-pay

## 2022-08-30 DIAGNOSIS — C50412 Malignant neoplasm of upper-outer quadrant of left female breast: Secondary | ICD-10-CM

## 2022-08-31 ENCOUNTER — Inpatient Hospital Stay: Payer: 59 | Attending: Hematology and Oncology

## 2022-08-31 ENCOUNTER — Encounter: Payer: Self-pay | Admitting: Physician Assistant

## 2022-08-31 ENCOUNTER — Other Ambulatory Visit: Payer: Self-pay

## 2022-08-31 ENCOUNTER — Inpatient Hospital Stay (HOSPITAL_BASED_OUTPATIENT_CLINIC_OR_DEPARTMENT_OTHER): Payer: 59 | Admitting: Hematology and Oncology

## 2022-08-31 DIAGNOSIS — D72819 Decreased white blood cell count, unspecified: Secondary | ICD-10-CM | POA: Diagnosis not present

## 2022-08-31 DIAGNOSIS — D509 Iron deficiency anemia, unspecified: Secondary | ICD-10-CM | POA: Insufficient documentation

## 2022-08-31 DIAGNOSIS — Z170 Estrogen receptor positive status [ER+]: Secondary | ICD-10-CM | POA: Diagnosis not present

## 2022-08-31 DIAGNOSIS — C50412 Malignant neoplasm of upper-outer quadrant of left female breast: Secondary | ICD-10-CM

## 2022-08-31 DIAGNOSIS — Z79810 Long term (current) use of selective estrogen receptor modulators (SERMs): Secondary | ICD-10-CM | POA: Insufficient documentation

## 2022-08-31 DIAGNOSIS — D0512 Intraductal carcinoma in situ of left breast: Secondary | ICD-10-CM

## 2022-08-31 LAB — CMP (CANCER CENTER ONLY)
ALT: 12 U/L (ref 0–44)
AST: 20 U/L (ref 15–41)
Albumin: 4.3 g/dL (ref 3.5–5.0)
Alkaline Phosphatase: 45 U/L (ref 38–126)
Anion gap: 3 — ABNORMAL LOW (ref 5–15)
BUN: 10 mg/dL (ref 6–20)
CO2: 31 mmol/L (ref 22–32)
Calcium: 9.1 mg/dL (ref 8.9–10.3)
Chloride: 107 mmol/L (ref 98–111)
Creatinine: 0.69 mg/dL (ref 0.44–1.00)
GFR, Estimated: >60 mL/min (ref >60)
Glucose, Bld: 73 mg/dL (ref 70–99)
Potassium: 4 mmol/L (ref 3.5–5.1)
Sodium: 141 mmol/L (ref 135–145)
Total Bilirubin: 0.5 mg/dL (ref 0.3–1.2)
Total Protein: 6.6 g/dL (ref 6.5–8.1)

## 2022-08-31 LAB — CBC WITH DIFFERENTIAL (CANCER CENTER ONLY)
Abs Immature Granulocytes: 0.01 10*3/uL (ref 0.00–0.07)
Basophils Absolute: 0 10*3/uL (ref 0.0–0.1)
Basophils Relative: 1 %
Eosinophils Absolute: 0 10*3/uL (ref 0.0–0.5)
Eosinophils Relative: 1 %
HCT: 39.5 % (ref 36.0–46.0)
Hemoglobin: 13.1 g/dL (ref 12.0–15.0)
Immature Granulocytes: 0 %
Lymphocytes Relative: 34 %
Lymphs Abs: 1 10*3/uL (ref 0.7–4.0)
MCH: 29.6 pg (ref 26.0–34.0)
MCHC: 33.2 g/dL (ref 30.0–36.0)
MCV: 89.4 fL (ref 80.0–100.0)
Monocytes Absolute: 0.2 10*3/uL (ref 0.1–1.0)
Monocytes Relative: 6 %
Neutro Abs: 1.8 10*3/uL (ref 1.7–7.7)
Neutrophils Relative %: 58 %
Platelet Count: 209 10*3/uL (ref 150–400)
RBC: 4.42 MIL/uL (ref 3.87–5.11)
RDW: 12.8 % (ref 11.5–15.5)
WBC Count: 3 10*3/uL — ABNORMAL LOW (ref 4.0–10.5)
nRBC: 0 % (ref 0.0–0.2)

## 2022-08-31 NOTE — Assessment & Plan Note (Addendum)
09/20/2014: Left breast UOQ T1 cN0 stage Ia grade 2 IDC ER/PR positive HER2 positive ratio 5.07 Ki-67 60% 02/10/2015: Completed TCHP x6 cycles (breast MRI complete radiologic response) 03/06/2015: Left lumpectomy: T1CN1A stage IIa grade 1 IDC with prognostic panel showing triple negative 10/01/2015: Completed adjuvant trastuzumab 04/16/2015 06/02/2015: Adjuvant radiation 06/26/2015: Started tamoxifen, goserelin started 04/03/2015-10/06/2015 (BSO 11/12/2015) November 2015: Genetics: Negative 08/17/2017: Recurrence: Left lumpectomy: Grade 2 DCIS ER/PR negative 09/26/2017: Bilateral mastectomies: Right: No malignancy, left: Grade 2 DCIS Other problems: Iron deficiency anemia and benign leukopenia: Stable ----------------------------------------------------------------------------------------------------------------- Current treatment: Tamoxifen Tamoxifen toxicities: 1.  Endometrial hypertrophy causing polyp formation: She is planning to undergo polypectomy. I recommended that we stop tamoxifen at this time Send for breast cancer index to determine if she would benefit from extended endocrine therapy. If the test shows that she needs to continue with tamoxifen then we will plan to reduce the dosage and see if that makes a difference to the uterine hypertrophy.  Breast cancer surveillance: 1.  Breast exam: Bilateral mastectomies.  No palpable lumps or nodules 2. no role of imaging studies since she had bilateral mastectomies. We also discussed the role of Signatera for minimal residual testing.  Return to clinic in 1 year for follow-up I will call her with the result of BCI.  Return to clinic in 1 year for follow-up

## 2022-08-31 NOTE — Progress Notes (Signed)
Orders entered for signatera testing per MD. Requisition and all supporting documents faxed to [phone] with fax confirmation.   Requistion and all supporting documents faxed to [facility] per MD. Fax confirmation received.

## 2022-09-07 ENCOUNTER — Telehealth: Payer: Self-pay

## 2022-09-07 NOTE — Telephone Encounter (Signed)
Received fax request from [facility] requesting additional imaging reports pre diagnosis. Printed and faxed pt's first diagnostic MM from 2015 and stated this was pt's very first MM as she was 70. Pt is aware of this as it may cause delay in results. She verbalized understanding. Fax confirmation received.

## 2022-09-08 DIAGNOSIS — D0512 Intraductal carcinoma in situ of left breast: Secondary | ICD-10-CM | POA: Diagnosis not present

## 2022-09-08 DIAGNOSIS — Z170 Estrogen receptor positive status [ER+]: Secondary | ICD-10-CM | POA: Diagnosis not present

## 2022-09-08 DIAGNOSIS — C50412 Malignant neoplasm of upper-outer quadrant of left female breast: Secondary | ICD-10-CM | POA: Diagnosis not present

## 2022-09-15 ENCOUNTER — Telehealth: Payer: Self-pay | Admitting: Hematology and Oncology

## 2022-09-15 NOTE — Telephone Encounter (Signed)
Breast cancer index apparently is very expensive and her insurance only covers per very small amount and therefore she and I discussed about her options. After much discussion, we decided to discontinue tamoxifen permanently because of her recurrent uterine polyps and uterine hypertrophy and the risk for uterine cancer.

## 2022-09-22 ENCOUNTER — Encounter: Payer: Self-pay | Admitting: Hematology and Oncology

## 2022-09-22 DIAGNOSIS — Z923 Personal history of irradiation: Secondary | ICD-10-CM | POA: Diagnosis not present

## 2022-09-22 DIAGNOSIS — Z9013 Acquired absence of bilateral breasts and nipples: Secondary | ICD-10-CM | POA: Diagnosis not present

## 2022-09-22 DIAGNOSIS — Z853 Personal history of malignant neoplasm of breast: Secondary | ICD-10-CM | POA: Diagnosis not present

## 2022-10-01 ENCOUNTER — Telehealth: Payer: Self-pay | Admitting: *Deleted

## 2022-10-01 LAB — SIGNATERA ONLY (NATERA MANAGED)
SIGNATERA MTM READOUT: 0 MTM/ml
SIGNATERA TEST RESULT: NEGATIVE

## 2022-10-01 NOTE — Telephone Encounter (Signed)
Called pt with Signatera with negative results. Pt was appreciative and verbalized understanding

## 2022-10-04 ENCOUNTER — Encounter: Payer: Self-pay | Admitting: Hematology and Oncology

## 2022-10-11 ENCOUNTER — Other Ambulatory Visit (HOSPITAL_COMMUNITY): Payer: Self-pay

## 2022-10-11 MED ORDER — MISOPROSTOL 200 MCG PO TABS
ORAL_TABLET | ORAL | 0 refills | Status: DC
  Filled 2022-10-11: qty 1, 1d supply, fill #0

## 2022-10-12 ENCOUNTER — Other Ambulatory Visit (HOSPITAL_COMMUNITY): Payer: Self-pay

## 2022-10-12 DIAGNOSIS — N840 Polyp of corpus uteri: Secondary | ICD-10-CM | POA: Diagnosis not present

## 2022-10-12 DIAGNOSIS — Z3202 Encounter for pregnancy test, result negative: Secondary | ICD-10-CM | POA: Diagnosis not present

## 2022-10-19 ENCOUNTER — Encounter (HOSPITAL_COMMUNITY): Payer: Self-pay

## 2022-11-25 ENCOUNTER — Encounter: Payer: Self-pay | Admitting: *Deleted

## 2022-12-14 ENCOUNTER — Encounter: Payer: Self-pay | Admitting: Physician Assistant

## 2022-12-21 DIAGNOSIS — C50412 Malignant neoplasm of upper-outer quadrant of left female breast: Secondary | ICD-10-CM | POA: Diagnosis not present

## 2022-12-21 DIAGNOSIS — D0512 Intraductal carcinoma in situ of left breast: Secondary | ICD-10-CM | POA: Diagnosis not present

## 2022-12-21 DIAGNOSIS — Z170 Estrogen receptor positive status [ER+]: Secondary | ICD-10-CM | POA: Diagnosis not present

## 2022-12-27 LAB — SIGNATERA
SIGNATERA MTM READOUT: 0 MTM/ml
SIGNATERA TEST RESULT: NEGATIVE

## 2022-12-28 ENCOUNTER — Telehealth: Payer: Self-pay | Admitting: *Deleted

## 2022-12-28 NOTE — Telephone Encounter (Signed)
Per MD request, RN placed call to pt regarding negative (Not Detected) recent Signatera testing.  No answer, LVM for pt to return call to the office.  

## 2023-01-03 ENCOUNTER — Ambulatory Visit
Admission: EM | Admit: 2023-01-03 | Discharge: 2023-01-03 | Disposition: A | Discharge status: Home / Self Care | Payer: 59 | Attending: Family Medicine | Admitting: Family Medicine

## 2023-01-03 ENCOUNTER — Other Ambulatory Visit (HOSPITAL_COMMUNITY): Payer: Self-pay

## 2023-01-03 DIAGNOSIS — S39012A Strain of muscle, fascia and tendon of lower back, initial encounter: Secondary | ICD-10-CM | POA: Diagnosis not present

## 2023-01-03 MED ORDER — TIZANIDINE HCL 4 MG PO CAPS
4.0000 mg | ORAL_CAPSULE | Freq: Three times a day (TID) | ORAL | 0 refills | Status: DC | PRN
  Filled 2023-01-03: qty 15, 5d supply, fill #0

## 2023-01-03 MED ORDER — KETOROLAC TROMETHAMINE 30 MG/ML IJ SOLN
30.0000 mg | Freq: Once | INTRAMUSCULAR | Status: AC
  Administered 2023-01-03: 30 mg via INTRAMUSCULAR

## 2023-01-03 NOTE — ED Triage Notes (Signed)
Pt reports she was lifting weights and her lower back locked up when she sat them down x 1 day. Pain radiates from her low back to hip area

## 2023-01-04 DIAGNOSIS — M5417 Radiculopathy, lumbosacral region: Secondary | ICD-10-CM | POA: Diagnosis not present

## 2023-01-04 DIAGNOSIS — M9902 Segmental and somatic dysfunction of thoracic region: Secondary | ICD-10-CM | POA: Diagnosis not present

## 2023-01-04 DIAGNOSIS — M9903 Segmental and somatic dysfunction of lumbar region: Secondary | ICD-10-CM | POA: Diagnosis not present

## 2023-01-04 DIAGNOSIS — M9904 Segmental and somatic dysfunction of sacral region: Secondary | ICD-10-CM | POA: Diagnosis not present

## 2023-01-04 NOTE — ED Provider Notes (Signed)
[facility]    CSN: [id number] Arrival date & time: 01/03/23  1252      History   Chief Complaint No chief complaint on file.   HPI Nancy Austin is a 47 y.o. female.   Patient presenting today with 1 day history of bilateral low back spasm following doing some weightlifting yesterday.  States that soon as she sat some weights down she got the pain in her back seized up.  The pain radiates from her left low back to the hip area posteriorly.  Denies radiation down the leg, numbness, tingling, weakness, bowel or bladder incontinence, saddle anesthesia.  So far trying over-the-counter pain relievers with no relief.  History of similar.    Past Medical History:  Diagnosis Date   Allergy    Anemia    Benign skin lesion of forehead 10/2015   Breast cancer ([place])    Cancer ([place])    History of breast cancer 09/2014   left   History of chemotherapy    finished 02/10/2015   History of radiation therapy 04/16/2015 - 06/02/2015   Palpitations    5 beats run of PAT noted on event monitor   PAT (paroxysmal atrial tachycardia)    1 episode of 5 beat run of PAT on event monitor>>asymptomatic   Personal history of chemotherapy    Personal history of radiation therapy    Vasovagal syncope     Patient Active Problem List   Diagnosis Date Noted   Chronic benign neutropenia ([place]) 08/20/2021   Vasovagal syncope    PAT (paroxysmal atrial tachycardia)    Palpitations    Leukopenia 08/13/2019   Iron deficiency anemia 12/20/2018   Vitamin D deficiency 05/01/2018   Absence of breast, acquired, bilateral 10/26/2017   Ductal carcinoma in situ (DCIS) of left breast 09/26/2017   Physical exam 03/28/2017   Low back pain at multiple sites 05/26/2016   Genetic testing 10/17/2015   Chemotherapy induced neutropenia ([place]) 02/03/2015   Neuropathy due to chemotherapeutic drug ([place]) 01/13/2015   Constipation 12/09/2014   Hot flashes 12/09/2014   Spotting 11/20/2014   Vasovagal near  syncope 11/16/2014   Malignant neoplasm of upper-outer quadrant of left breast in female, estrogen receptor positive ([place]) 09/24/2014    Past Surgical History:  Procedure Laterality Date   AUGMENTATION MAMMAPLASTY Bilateral    BREAST LUMPECTOMY     BREAST RECONSTRUCTION WITH PLACEMENT OF TISSUE EXPANDER AND FLEX HD (ACELLULAR HYDRATED DERMIS) Right 09/26/2017   Procedure: RIGHT BREAST RECONSTRUCTION WITH PLACEMENT OF TISSUE EXPANDER AND FLEX HD (ACELLULAR HYDRATED DERMIS);  Surgeon: Irene Limbo, MD;  Location: [facility];  Service: Plastics;  Laterality: Right;   LAPAROSCOPIC OOPHERECTOMY Bilateral    LAPAROSCOPIC SALPINGO OOPHERECTOMY Bilateral 11/12/2015   Procedure: LAPAROSCOPIC BILATERAL SALPINGO OOPHORECTOMY;  Surgeon: Paula Compton, MD;  Location: [facility];  Service: Gynecology;  Laterality: Bilateral;   LATISSIMUS FLAP TO BREAST Left 09/26/2017   Procedure: LATISSIMUS FLAP TO LEFT BREAST;  Surgeon: Irene Limbo, MD;  Location: [facility];  Service: Plastics;  Laterality: Left;   LESION REMOVAL Left 10/22/2015   Procedure: EXCISION OF FOREHEAD LESION 1X1CM;  Surgeon: Rolm Bookbinder, MD;  Location: [facility];  Service: General;  Laterality: Left;   MASTECTOMY Bilateral    NIPPLE SPARING MASTECTOMY Bilateral 09/26/2017   Procedure: LEFT NIPPLE SPARING MASTECTOMY, RIGHT PROPHYLACTIC NIPPLE SPARING MASTECTOMY;  Surgeon: Rolm Bookbinder, MD;  Location: [facility];  Service: General;  Laterality: Bilateral;   PORT-A-CATH REMOVAL N/A 10/22/2015   Procedure:  REMOVAL PORT-A-CATH;  Surgeon: Rolm Bookbinder, MD;  Location: [place] [facility];  Service: General;  Laterality: N/A;   PORTACATH PLACEMENT N/A 10/15/2014   Procedure: INSERTION PORT-A-CATH;  Surgeon: Rolm Bookbinder, MD;  Location: [facility];  Service: General;  Laterality: N/A;   RADIOACTIVE SEED GUIDED EXCISIONAL BREAST BIOPSY Left 08/17/2017   Procedure: LEFT RADIOACTIVE SEED GUIDED EXCISIONAL BREAST BIOPSY ERAS  PATHWAY;  Surgeon: Rolm Bookbinder, MD;  Location: [facility];  Service: General;  Laterality: Left;   RADIOACTIVE SEED GUIDED PARTIAL MASTECTOMY WITH AXILLARY SENTINEL LYMPH NODE BIOPSY Left 03/06/2015   Procedure: RADIOACTIVE SEED GUIDED PARTIAL MASTECTOMY WITH AXILLARY SENTINEL LYMPH NODE BIOPSY;  Surgeon: Rolm Bookbinder, MD;  Location: [facility];  Service: General;  Laterality: Left;   REMOVAL OF BILATERAL TISSUE EXPANDERS WITH PLACEMENT OF BILATERAL BREAST IMPLANTS Bilateral 01/06/2018   Procedure: REMOVAL OF BILATERAL TISSUE EXPANDERS WITH PLACEMENT OF BILATERAL BREAST IMPLANTS;  Surgeon: Irene Limbo, MD;  Location: [facility];  Service: Plastics;  Laterality: Bilateral;   TONSILLECTOMY  1996    OB History   No obstetric history on file.      Home Medications    Prior to Admission medications   Medication Sig Start Date End Date Taking? Authorizing Provider  tiZANidine (ZANAFLEX) 4 MG capsule Take 1 capsule (4 mg total) by mouth 3 (three) times daily as needed for muscle spasms. Do not drink alcohol or drive while taking this medication.  May cause drowsiness. 01/03/23  Yes Volney American, PA-C  ibuprofen (ADVIL) 600 MG tablet Take 1 tablet by mouth every 6 (six) hours as needed.    [provider]  meloxicam (MOBIC) 15 MG tablet Take 1 tablet by mouth daily.    [provider]  misoprostol (CYTOTEC) 200 MCG tablet Moisten tablets and place high up inside the vagina 3-4 hours prior to procedure as directed. 10/11/22     Multiple Vitamin (MULTI-VITAMINS) TABS Take by mouth. 03/25/15   [provider]  oxyCODONE-acetaminophen (PERCOCET/ROXICET) 5-325 MG tablet TAKE 1 to 2 TABLETS BY MOUTH EVERY 6 HOURS AS NEEDED FOR SEVERE PAIN.    [provider]  tamoxifen (NOLVADEX) 20 MG tablet TAKE 1 TABLET BY MOUTH ONCE A DAY 11/16/21   Magrinat, Virgie Dad, MD    Family History Family History   Problem Relation Age of Onset   Skin cancer Father    Hyperlipidemia Father    Prostate cancer Father    Prostate cancer Maternal Uncle 53       currently 64   Multiple myeloma Paternal Grandmother 15       Deceased 40   Cancer Paternal Grandmother        bone   Prostate cancer Paternal Uncle    Lung disease Maternal Grandmother    AAA (abdominal aortic aneurysm) Maternal Grandmother    Stroke Maternal Grandfather    Colon cancer Neg Hx    Esophageal cancer Neg Hx    Rectal cancer Neg Hx    Stomach cancer Neg Hx     Social History Social History   Tobacco Use   Smoking status: Never   Smokeless tobacco: Never  Vaping Use   Vaping Use: Never used  Substance Use Topics   Alcohol use: Yes    Comment: 3 x/week   Drug use: No     Allergies   Chlorhexidine, Morphine and related, Other, and Tegaderm ag mesh [silver]   Review of Systems Review of Systems PER HPI  Physical Exam Triage Vital Signs ED Triage Vitals  Enc Vitals Group     BP 01/03/23 1348 121/80     Pulse Rate 01/03/23 1348 (!) 56     Resp 01/03/23 1348 20     Temp 01/03/23 1348 97.9 F (36.6 C)     Temp Source 01/03/23 1348 Oral     SpO2 01/03/23 1348 98 %     Weight --      Height --      Head Circumference --      Peak Flow --      Pain Score 01/03/23 1351 8     Pain Loc --      Pain Edu? --      Excl. in [place]? --    No data found.  Updated Vital Signs BP 121/80 (BP Location: Right Arm)   Pulse (!) 56   Temp 97.9 F (36.6 C) (Oral)   Resp 20   LMP 02/22/2015   SpO2 98%   Visual Acuity Right Eye Distance:   Left Eye Distance:   Bilateral Distance:    Right Eye Near:   Left Eye Near:    Bilateral Near:     Physical Exam Vitals and nursing note reviewed.  Constitutional:      Appearance: Normal appearance. She is not ill-appearing.  HENT:     Head: Atraumatic.     Mouth/Throat:     Mouth: Mucous membranes are moist.  Eyes:     Extraocular Movements: Extraocular movements  intact.     Conjunctiva/sclera: Conjunctivae normal.  Cardiovascular:     Rate and Rhythm: Normal rate and regular rhythm.     Heart sounds: Normal heart sounds.  Pulmonary:     Effort: Pulmonary effort is normal.     Breath sounds: Normal breath sounds.  Abdominal:     General: Bowel sounds are normal. There is no distension.     Palpations: Abdomen is soft.     Tenderness: There is no abdominal tenderness. There is no guarding.  Musculoskeletal:        General: Normal range of motion.     Cervical back: Normal range of motion and neck supple.     Comments: No midline spinal tenderness to palpation diffusely.  Bilateral lumbar musculature tender to palpation laterally.  Negative straight leg raise bilateral lower extremities.  Range of motion and gait normal  Skin:    General: Skin is warm and dry.  Neurological:     Mental Status: She is alert and oriented to person, place, and time.     Motor: No weakness.     Gait: Gait normal.     Comments: Bilateral lower extremities neurovascularly intact  Psychiatric:        Mood and Affect: Mood normal.        Thought Content: Thought content normal.        Judgment: Judgment normal.      UC Treatments / Results  Labs (all labs ordered are listed, but only abnormal results are displayed) Labs Reviewed - No data to display  EKG   Radiology No results found.  Procedures Procedures (including critical care time)  Medications Ordered in UC Medications  ketorolac (TORADOL) 30 MG/ML injection 30 mg (30 mg Intramuscular Given 01/03/23 1435)    Initial Impression / Assessment and Plan / UC Course  I have reviewed the triage vital signs and the nursing notes.  Pertinent labs & imaging results that were available during my care  of the patient were reviewed by me and considered in my medical decision making (see chart for details).     Vitals and exam reassuring, no red flag findings today on exam.  Consistent with lumbar strain.   Treat with IM Toradol, Zanaflex, heat, massage, stretches.  Return for worsening symptoms.  Final Clinical Impressions(s) / UC Diagnoses   Final diagnoses:  Strain of lumbar region, initial encounter   Discharge Instructions   None    ED Prescriptions     Medication Sig Dispense Auth. Provider   tiZANidine (ZANAFLEX) 4 MG capsule Take 1 capsule (4 mg total) by mouth 3 (three) times daily as needed for muscle spasms. Do not drink alcohol or drive while taking this medication.  May cause drowsiness. 15 capsule Volney American, [place]      PDMP not reviewed this encounter.   Volney American, [place] 01/04/23 1112

## 2023-01-05 DIAGNOSIS — M9902 Segmental and somatic dysfunction of thoracic region: Secondary | ICD-10-CM | POA: Diagnosis not present

## 2023-01-05 DIAGNOSIS — M9903 Segmental and somatic dysfunction of lumbar region: Secondary | ICD-10-CM | POA: Diagnosis not present

## 2023-01-05 DIAGNOSIS — M5417 Radiculopathy, lumbosacral region: Secondary | ICD-10-CM | POA: Diagnosis not present

## 2023-01-05 DIAGNOSIS — M9904 Segmental and somatic dysfunction of sacral region: Secondary | ICD-10-CM | POA: Diagnosis not present

## 2023-01-07 DIAGNOSIS — M9904 Segmental and somatic dysfunction of sacral region: Secondary | ICD-10-CM | POA: Diagnosis not present

## 2023-01-07 DIAGNOSIS — M9903 Segmental and somatic dysfunction of lumbar region: Secondary | ICD-10-CM | POA: Diagnosis not present

## 2023-01-07 DIAGNOSIS — M5417 Radiculopathy, lumbosacral region: Secondary | ICD-10-CM | POA: Diagnosis not present

## 2023-01-07 DIAGNOSIS — M9902 Segmental and somatic dysfunction of thoracic region: Secondary | ICD-10-CM | POA: Diagnosis not present

## 2023-01-11 DIAGNOSIS — M9903 Segmental and somatic dysfunction of lumbar region: Secondary | ICD-10-CM | POA: Diagnosis not present

## 2023-01-11 DIAGNOSIS — M9902 Segmental and somatic dysfunction of thoracic region: Secondary | ICD-10-CM | POA: Diagnosis not present

## 2023-01-11 DIAGNOSIS — M5417 Radiculopathy, lumbosacral region: Secondary | ICD-10-CM | POA: Diagnosis not present

## 2023-01-11 DIAGNOSIS — M9904 Segmental and somatic dysfunction of sacral region: Secondary | ICD-10-CM | POA: Diagnosis not present

## 2023-01-14 DIAGNOSIS — M9904 Segmental and somatic dysfunction of sacral region: Secondary | ICD-10-CM | POA: Diagnosis not present

## 2023-01-14 DIAGNOSIS — M9903 Segmental and somatic dysfunction of lumbar region: Secondary | ICD-10-CM | POA: Diagnosis not present

## 2023-01-14 DIAGNOSIS — M9902 Segmental and somatic dysfunction of thoracic region: Secondary | ICD-10-CM | POA: Diagnosis not present

## 2023-01-14 DIAGNOSIS — M5417 Radiculopathy, lumbosacral region: Secondary | ICD-10-CM | POA: Diagnosis not present

## 2023-01-18 ENCOUNTER — Other Ambulatory Visit (HOSPITAL_COMMUNITY): Payer: Self-pay

## 2023-01-18 DIAGNOSIS — M545 Low back pain, unspecified: Secondary | ICD-10-CM | POA: Diagnosis not present

## 2023-01-18 DIAGNOSIS — M1991 Primary osteoarthritis, unspecified site: Secondary | ICD-10-CM | POA: Diagnosis not present

## 2023-01-18 DIAGNOSIS — L601 Onycholysis: Secondary | ICD-10-CM | POA: Diagnosis not present

## 2023-01-18 DIAGNOSIS — Z6821 Body mass index (BMI) 21.0-21.9, adult: Secondary | ICD-10-CM | POA: Diagnosis not present

## 2023-01-18 DIAGNOSIS — C50912 Malignant neoplasm of unspecified site of left female breast: Secondary | ICD-10-CM | POA: Diagnosis not present

## 2023-01-18 MED ORDER — CELECOXIB 100 MG PO CAPS
100.0000 mg | ORAL_CAPSULE | Freq: Every day | ORAL | 1 refills | Status: AC | PRN
  Filled 2023-01-18 – 2023-11-14 (×2): qty 90, 90d supply, fill #0

## 2023-01-19 DIAGNOSIS — M9904 Segmental and somatic dysfunction of sacral region: Secondary | ICD-10-CM | POA: Diagnosis not present

## 2023-01-19 DIAGNOSIS — M9903 Segmental and somatic dysfunction of lumbar region: Secondary | ICD-10-CM | POA: Diagnosis not present

## 2023-01-19 DIAGNOSIS — M9902 Segmental and somatic dysfunction of thoracic region: Secondary | ICD-10-CM | POA: Diagnosis not present

## 2023-01-19 DIAGNOSIS — M5417 Radiculopathy, lumbosacral region: Secondary | ICD-10-CM | POA: Diagnosis not present

## 2023-01-25 DIAGNOSIS — M9903 Segmental and somatic dysfunction of lumbar region: Secondary | ICD-10-CM | POA: Diagnosis not present

## 2023-01-25 DIAGNOSIS — M9904 Segmental and somatic dysfunction of sacral region: Secondary | ICD-10-CM | POA: Diagnosis not present

## 2023-01-25 DIAGNOSIS — M9902 Segmental and somatic dysfunction of thoracic region: Secondary | ICD-10-CM | POA: Diagnosis not present

## 2023-01-25 DIAGNOSIS — M5417 Radiculopathy, lumbosacral region: Secondary | ICD-10-CM | POA: Diagnosis not present

## 2023-01-27 ENCOUNTER — Other Ambulatory Visit (HOSPITAL_COMMUNITY): Payer: Self-pay

## 2023-01-28 DIAGNOSIS — M9902 Segmental and somatic dysfunction of thoracic region: Secondary | ICD-10-CM | POA: Diagnosis not present

## 2023-01-28 DIAGNOSIS — M9903 Segmental and somatic dysfunction of lumbar region: Secondary | ICD-10-CM | POA: Diagnosis not present

## 2023-01-28 DIAGNOSIS — M9904 Segmental and somatic dysfunction of sacral region: Secondary | ICD-10-CM | POA: Diagnosis not present

## 2023-01-28 DIAGNOSIS — M5417 Radiculopathy, lumbosacral region: Secondary | ICD-10-CM | POA: Diagnosis not present

## 2023-02-02 DIAGNOSIS — M9902 Segmental and somatic dysfunction of thoracic region: Secondary | ICD-10-CM | POA: Diagnosis not present

## 2023-02-02 DIAGNOSIS — M9903 Segmental and somatic dysfunction of lumbar region: Secondary | ICD-10-CM | POA: Diagnosis not present

## 2023-02-02 DIAGNOSIS — M9904 Segmental and somatic dysfunction of sacral region: Secondary | ICD-10-CM | POA: Diagnosis not present

## 2023-02-02 DIAGNOSIS — M5417 Radiculopathy, lumbosacral region: Secondary | ICD-10-CM | POA: Diagnosis not present

## 2023-02-16 DIAGNOSIS — M9904 Segmental and somatic dysfunction of sacral region: Secondary | ICD-10-CM | POA: Diagnosis not present

## 2023-02-16 DIAGNOSIS — M5417 Radiculopathy, lumbosacral region: Secondary | ICD-10-CM | POA: Diagnosis not present

## 2023-02-16 DIAGNOSIS — M9902 Segmental and somatic dysfunction of thoracic region: Secondary | ICD-10-CM | POA: Diagnosis not present

## 2023-02-16 DIAGNOSIS — M9903 Segmental and somatic dysfunction of lumbar region: Secondary | ICD-10-CM | POA: Diagnosis not present

## 2023-02-17 DIAGNOSIS — H524 Presbyopia: Secondary | ICD-10-CM | POA: Diagnosis not present

## 2023-02-22 ENCOUNTER — Encounter: Payer: Self-pay | Admitting: Physician Assistant

## 2023-02-22 ENCOUNTER — Ambulatory Visit (INDEPENDENT_AMBULATORY_CARE_PROVIDER_SITE_OTHER): Payer: 59 | Admitting: Physician Assistant

## 2023-02-22 VITALS — BP 111/74 | HR 47 | Temp 97.3°F | Resp 16 | Ht 65.5 in | Wt 131.8 lb

## 2023-02-22 DIAGNOSIS — Z0000 Encounter for general adult medical examination without abnormal findings: Secondary | ICD-10-CM

## 2023-02-22 LAB — LIPID PANEL
Cholesterol: 163 mg/dL (ref 0–200)
HDL: 63.1 mg/dL (ref >39.00)
LDL Cholesterol: 90 mg/dL (ref 0–99)
NonHDL: 99.89
Total CHOL/HDL Ratio: 3
Triglycerides: 50 mg/dL (ref 0.0–149.0)
VLDL: 10 mg/dL (ref 0.0–40.0)

## 2023-02-22 LAB — CBC WITH DIFFERENTIAL/PLATELET
Basophils Absolute: 0 10*3/uL (ref 0.0–0.1)
Basophils Relative: 0.9 % (ref 0.0–3.0)
Eosinophils Absolute: 0 10*3/uL (ref 0.0–0.7)
Eosinophils Relative: 1.2 % (ref 0.0–5.0)
HCT: 41.8 % (ref 36.0–46.0)
Hemoglobin: 13.8 g/dL (ref 12.0–15.0)
Lymphocytes Relative: 34.5 % (ref 12.0–46.0)
Lymphs Abs: 1.2 10*3/uL (ref 0.7–4.0)
MCHC: 33 g/dL (ref 30.0–36.0)
MCV: 87.6 fl (ref 78.0–100.0)
Monocytes Absolute: 0.2 10*3/uL (ref 0.1–1.0)
Monocytes Relative: 6.6 % (ref 3.0–12.0)
Neutro Abs: 2 10*3/uL (ref 1.4–7.7)
Neutrophils Relative %: 56.8 % (ref 43.0–77.0)
Platelets: 235 10*3/uL (ref 150.0–400.0)
RBC: 4.77 Mil/uL (ref 3.87–5.11)
RDW: 13.9 % (ref 11.5–15.5)
WBC: 3.4 10*3/uL — ABNORMAL LOW (ref 4.0–10.5)

## 2023-02-22 LAB — VITAMIN B12: Vitamin B-12: 384 pg/mL (ref 211–911)

## 2023-02-22 LAB — COMPREHENSIVE METABOLIC PANEL
ALT: 18 U/L (ref 0–35)
AST: 22 U/L (ref 0–37)
Albumin: 4.3 g/dL (ref 3.5–5.2)
Alkaline Phosphatase: 62 U/L (ref 39–117)
BUN: 16 mg/dL (ref 6–23)
CO2: 31 mEq/L (ref 19–32)
Calcium: 9.9 mg/dL (ref 8.4–10.5)
Chloride: 101 mEq/L (ref 96–112)
Creatinine, Ser: 0.62 mg/dL (ref 0.40–1.20)
GFR: 106.26 mL/min (ref >60.00)
Glucose, Bld: 90 mg/dL (ref 70–99)
Potassium: 4 mEq/L (ref 3.5–5.1)
Sodium: 140 mEq/L (ref 135–145)
Total Bilirubin: 0.6 mg/dL (ref 0.2–1.2)
Total Protein: 6.6 g/dL (ref 6.0–8.3)

## 2023-02-22 LAB — IBC + FERRITIN
Ferritin: 27.1 ng/mL (ref 10.0–291.0)
Iron: 67 ug/dL (ref 42–145)
Saturation Ratios: 18.7 % — ABNORMAL LOW (ref 20.0–50.0)
TIBC: 358.4 ug/dL (ref 250.0–450.0)
Transferrin: 256 mg/dL (ref 212.0–360.0)

## 2023-02-22 LAB — TSH: TSH: 3.47 u[IU]/mL (ref 0.35–5.50)

## 2023-02-22 LAB — VITAMIN D 25 HYDROXY (VIT D DEFICIENCY, FRACTURES): VITD: 20.76 ng/mL — ABNORMAL LOW (ref 30.00–100.00)

## 2023-02-22 NOTE — Progress Notes (Unsigned)
Subjective:    Patient ID: Nancy Austin, female    DOB: 1976/05/21, 47 y.o.   MRN: [id number]  Chief Complaint  Patient presents with   Annual Exam    Fasting Requesting labs to check iron, vit b12 and d     HPI Patient is in today for annual exam.   Acute concerns: She's been having more back pain in SI joints lately - seeing chiro and stretching  Health maintenance: Lifestyle/ exercise: Walking, lifting weights regularly  Nutrition: Eating well, well-balanced at home  Mental health: Well Sleep: Good overall  Substance use: None  ETOH: rare  Sexual activity: Monogamous  Immunizations: UTD  Colonoscopy: UTD Pap: UTD Mammogram: UTD  Skin: Dermatology annually   Past Medical History:  Diagnosis Date   Allergy    Anemia    Benign skin lesion of forehead 10/2015   Breast cancer ([place])    Cancer ([place])    History of breast cancer 09/2014   left   History of chemotherapy    finished 02/10/2015   History of radiation therapy 04/16/2015 - 06/02/2015   Palpitations    5 beats run of PAT noted on event monitor   PAT (paroxysmal atrial tachycardia)    1 episode of 5 beat run of PAT on event monitor>>asymptomatic   Personal history of chemotherapy    Personal history of radiation therapy    Vasovagal syncope     Past Surgical History:  Procedure Laterality Date   AUGMENTATION MAMMAPLASTY Bilateral    BREAST LUMPECTOMY     BREAST RECONSTRUCTION WITH PLACEMENT OF TISSUE EXPANDER AND FLEX HD (ACELLULAR HYDRATED DERMIS) Right 09/26/2017   Procedure: RIGHT BREAST RECONSTRUCTION WITH PLACEMENT OF TISSUE EXPANDER AND FLEX HD (ACELLULAR HYDRATED DERMIS);  Surgeon: Irene Limbo, MD;  Location: [facility];  Service: Plastics;  Laterality: Right;   LAPAROSCOPIC OOPHERECTOMY Bilateral    LAPAROSCOPIC SALPINGO OOPHERECTOMY Bilateral 11/12/2015   Procedure: LAPAROSCOPIC BILATERAL SALPINGO OOPHORECTOMY;  Surgeon: Paula Compton, MD;  Location: [facility];  Service: Gynecology;   Laterality: Bilateral;   LATISSIMUS FLAP TO BREAST Left 09/26/2017   Procedure: LATISSIMUS FLAP TO LEFT BREAST;  Surgeon: Irene Limbo, MD;  Location: [facility];  Service: Plastics;  Laterality: Left;   LESION REMOVAL Left 10/22/2015   Procedure: EXCISION OF FOREHEAD LESION 1X1CM;  Surgeon: Rolm Bookbinder, MD;  Location: [facility];  Service: General;  Laterality: Left;   MASTECTOMY Bilateral    NIPPLE SPARING MASTECTOMY Bilateral 09/26/2017   Procedure: LEFT NIPPLE SPARING MASTECTOMY, RIGHT PROPHYLACTIC NIPPLE SPARING MASTECTOMY;  Surgeon: Rolm Bookbinder, MD;  Location: [facility];  Service: General;  Laterality: Bilateral;   PORT-A-CATH REMOVAL N/A 10/22/2015   Procedure: REMOVAL PORT-A-CATH;  Surgeon: Rolm Bookbinder, MD;  Location: [facility];  Service: General;  Laterality: N/A;   PORTACATH PLACEMENT N/A 10/15/2014   Procedure: INSERTION PORT-A-CATH;  Surgeon: Rolm Bookbinder, MD;  Location[facility];  Service: General;  Laterality: N/A;   RADIOACTIVE SEED GUIDED EXCISIONAL BREAST BIOPSY Left 08/17/2017   Procedure: LEFT RADIOACTIVE SEED GUIDED EXCISIONAL BREAST BIOPSY ERAS PATHWAY;  Surgeon: Rolm Bookbinder, MD;  Location: [facility];  Service: General;  Laterality: Left;   RADIOACTIVE SEED GUIDED PARTIAL MASTECTOMY WITH AXILLARY SENTINEL LYMPH NODE BIOPSY Left 03/06/2015   Procedure: RADIOACTIVE SEED GUIDED PARTIAL MASTECTOMY WITH AXILLARY SENTINEL LYMPH NODE BIOPSY;  Surgeon: Rolm Bookbinder, MD;  Location: [facility];  Service: General;  Laterality: Left;   REMOVAL OF BILATERAL TISSUE EXPANDERS WITH PLACEMENT  OF BILATERAL BREAST IMPLANTS Bilateral 01/06/2018   Procedure: REMOVAL OF BILATERAL TISSUE EXPANDERS WITH PLACEMENT OF BILATERAL BREAST IMPLANTS;  Surgeon: Irene Limbo, MD;  Location: [facility];  Service: Plastics;  Laterality: Bilateral;   TONSILLECTOMY  1996    Family History  Problem Relation  Age of Onset   Skin cancer Father    Hyperlipidemia Father    Prostate cancer Father    Prostate cancer Maternal Uncle 16       currently 43   Multiple myeloma Paternal Grandmother 39       Deceased 24   Cancer Paternal Grandmother        bone   Prostate cancer Paternal Uncle    Lung disease Maternal Grandmother    AAA (abdominal aortic aneurysm) Maternal Grandmother    Stroke Maternal Grandfather    Colon cancer Neg Hx    Esophageal cancer Neg Hx    Rectal cancer Neg Hx    Stomach cancer Neg Hx     Social History   Tobacco Use   Smoking status: Never   Smokeless tobacco: Never  Vaping Use   Vaping Use: Never used  Substance Use Topics   Alcohol use: Yes    Comment: 3 x/week   Drug use: No     Allergies  Allergen Reactions   Chlorhexidine Itching and Rash   Morphine And Related Nausea And Vomiting   Other Rash    Dermabond   Tegaderm Ag Mesh [Silver] Rash    Reports that the larger tegaderm causes rashes    Review of Systems NEGATIVE UNLESS OTHERWISE INDICATED IN HPI      Objective:     BP 111/74   Pulse (!) 47   Temp (!) 97.3 F (36.3 C) (Temporal)   Resp 16   Ht 5' 5.5" (1.664 m)   Wt 131 lb 12.8 oz (59.8 kg)   SpO2 97%   BMI 21.60 kg/m   Wt Readings from Last 3 Encounters:  02/22/23 131 lb 12.8 oz (59.8 kg)  08/31/22 128 lb 8 oz (58.3 kg)  02/19/22 125 lb 9.6 oz (57 kg)    BP Readings from Last 3 Encounters:  02/22/23 111/74  01/03/23 121/80  08/31/22 109/78     Physical Exam Vitals and nursing note reviewed.  Constitutional:      Appearance: Normal appearance. She is normal weight. She is not toxic-appearing.  HENT:     Head: Normocephalic and atraumatic.     Right Ear: Tympanic membrane, ear canal and external ear normal.     Left Ear: Tympanic membrane, ear canal and external ear normal.     Nose: Nose normal.     Mouth/Throat:     Mouth: Mucous membranes are moist.  Eyes:     Extraocular Movements: Extraocular movements  intact.     Conjunctiva/sclera: Conjunctivae normal.     Pupils: Pupils are equal, round, and reactive to light.  Cardiovascular:     Rate and Rhythm: Normal rate and regular rhythm.     Pulses: Normal pulses.     Heart sounds: Normal heart sounds.  Pulmonary:     Effort: Pulmonary effort is normal.     Breath sounds: Normal breath sounds.  Abdominal:     General: Abdomen is flat. Bowel sounds are normal.     Palpations: Abdomen is soft.  Musculoskeletal:        General: Normal range of motion.     Cervical back: Normal range of motion and  neck supple.  Skin:    General: Skin is warm and dry.  Neurological:     General: No focal deficit present.     Mental Status: She is alert and oriented to person, place, and time.  Psychiatric:        Mood and Affect: Mood normal.        Behavior: Behavior normal.        Thought Content: Thought content normal.        Judgment: Judgment normal.        Assessment & Plan:  Encounter for annual physical exam -     VITAMIN D 25 Hydroxy (Vit-D Deficiency, Fractures) -     Vitamin B12 -     TSH -     Comprehensive metabolic panel -     CBC with Differential/Platelet -     Lipid panel -     IBC + Ferritin  Age-appropriate screening and counseling performed today. Will check labs and call with results. Preventive measures discussed and printed in AVS for patient.   Patient Counseling: '[x]'$   Nutrition: Stressed importance of moderation in sodium/caffeine intake, saturated fat and cholesterol, caloric balance, sufficient intake of fresh fruits, vegetables, and fiber.  '[x]'$   Stressed the importance of regular exercise.   '[]'$   Substance Abuse: Discussed cessation/primary prevention of tobacco, alcohol, or other drug use; driving or other dangerous activities under the influence; availability of treatment for abuse.   '[]'$   Injury prevention: Discussed safety belts, safety helmets, smoke detector, smoking near bedding or upholstery.   '[]'$   Sexuality:  Discussed sexually transmitted diseases, partner selection, use of condoms, avoidance of unintended pregnancy  and contraceptive alternatives.   '[x]'$   Dental health: Discussed importance of regular tooth brushing, flossing, and dental visits.  '[x]'$   Health maintenance and immunizations reviewed. Please refer to Health maintenance section.       Return in about 1 year (around 02/22/2024) for CPE, fasting labs .    Rosland Riding M Desani Sprung, PA-C

## 2023-03-15 DIAGNOSIS — M9903 Segmental and somatic dysfunction of lumbar region: Secondary | ICD-10-CM | POA: Diagnosis not present

## 2023-03-15 DIAGNOSIS — M9904 Segmental and somatic dysfunction of sacral region: Secondary | ICD-10-CM | POA: Diagnosis not present

## 2023-03-15 DIAGNOSIS — M9902 Segmental and somatic dysfunction of thoracic region: Secondary | ICD-10-CM | POA: Diagnosis not present

## 2023-03-15 DIAGNOSIS — M5417 Radiculopathy, lumbosacral region: Secondary | ICD-10-CM | POA: Diagnosis not present

## 2023-03-15 NOTE — Telephone Encounter (Signed)
Please see pt request and advise 

## 2023-03-15 NOTE — Telephone Encounter (Signed)
Received call from patient , requesting cb in regards to this . Call back number is [phone]

## 2023-03-16 ENCOUNTER — Other Ambulatory Visit: Payer: Self-pay | Admitting: Physician Assistant

## 2023-03-16 DIAGNOSIS — R5383 Other fatigue: Secondary | ICD-10-CM

## 2023-03-16 NOTE — Telephone Encounter (Signed)
Called pt and advised future lab orders that were placed,scheduled lab appt with patient

## 2023-03-18 ENCOUNTER — Other Ambulatory Visit (INDEPENDENT_AMBULATORY_CARE_PROVIDER_SITE_OTHER): Payer: 59

## 2023-03-18 DIAGNOSIS — R5383 Other fatigue: Secondary | ICD-10-CM | POA: Diagnosis not present

## 2023-03-19 LAB — THYROID PANEL WITH TSH
Free Thyroxine Index: 1.9 (ref 1.4–3.8)
T3 Uptake: 33 % (ref 22–35)
T4, Total: 5.8 ug/dL (ref 5.1–11.9)
TSH: 2.91 mIU/L

## 2023-03-23 LAB — SIGNATERA
SIGNATERA MTM READOUT: 0 MTM/ml
SIGNATERA TEST RESULT: NEGATIVE

## 2023-04-11 DIAGNOSIS — L821 Other seborrheic keratosis: Secondary | ICD-10-CM | POA: Diagnosis not present

## 2023-04-11 DIAGNOSIS — Z411 Encounter for cosmetic surgery: Secondary | ICD-10-CM | POA: Diagnosis not present

## 2023-04-11 DIAGNOSIS — D485 Neoplasm of uncertain behavior of skin: Secondary | ICD-10-CM | POA: Diagnosis not present

## 2023-04-11 DIAGNOSIS — L578 Other skin changes due to chronic exposure to nonionizing radiation: Secondary | ICD-10-CM | POA: Diagnosis not present

## 2023-04-11 DIAGNOSIS — L814 Other melanin hyperpigmentation: Secondary | ICD-10-CM | POA: Diagnosis not present

## 2023-04-11 DIAGNOSIS — B078 Other viral warts: Secondary | ICD-10-CM | POA: Diagnosis not present

## 2023-04-11 DIAGNOSIS — D229 Melanocytic nevi, unspecified: Secondary | ICD-10-CM | POA: Diagnosis not present

## 2023-04-15 ENCOUNTER — Telehealth: Payer: Self-pay

## 2023-04-15 NOTE — Telephone Encounter (Signed)
Called pt per MD to advise [facility] testing was negative/not detected. Pt verbalized understanding of results and knows [facility] will be in touch to schedule 3 mo repeat lab.   

## 2023-04-26 DIAGNOSIS — M9903 Segmental and somatic dysfunction of lumbar region: Secondary | ICD-10-CM | POA: Diagnosis not present

## 2023-04-26 DIAGNOSIS — M5417 Radiculopathy, lumbosacral region: Secondary | ICD-10-CM | POA: Diagnosis not present

## 2023-04-26 DIAGNOSIS — M9902 Segmental and somatic dysfunction of thoracic region: Secondary | ICD-10-CM | POA: Diagnosis not present

## 2023-04-26 DIAGNOSIS — M9904 Segmental and somatic dysfunction of sacral region: Secondary | ICD-10-CM | POA: Diagnosis not present

## 2023-06-21 LAB — SIGNATERA
SIGNATERA MTM READOUT: 0 MTM/ml
SIGNATERA TEST RESULT: NEGATIVE

## 2023-08-03 DIAGNOSIS — Z01419 Encounter for gynecological examination (general) (routine) without abnormal findings: Secondary | ICD-10-CM | POA: Diagnosis not present

## 2023-08-03 DIAGNOSIS — Z124 Encounter for screening for malignant neoplasm of cervix: Secondary | ICD-10-CM | POA: Diagnosis not present

## 2023-08-03 DIAGNOSIS — M8580 Other specified disorders of bone density and structure, unspecified site: Secondary | ICD-10-CM | POA: Diagnosis not present

## 2023-08-03 DIAGNOSIS — Z853 Personal history of malignant neoplasm of breast: Secondary | ICD-10-CM | POA: Diagnosis not present

## 2023-08-03 DIAGNOSIS — Z1151 Encounter for screening for human papillomavirus (HPV): Secondary | ICD-10-CM | POA: Diagnosis not present

## 2023-08-03 DIAGNOSIS — N840 Polyp of corpus uteri: Secondary | ICD-10-CM | POA: Diagnosis not present

## 2023-08-03 DIAGNOSIS — Z1389 Encounter for screening for other disorder: Secondary | ICD-10-CM | POA: Diagnosis not present

## 2023-08-03 LAB — HM PAP SMEAR: HM Pap smear: NEGATIVE

## 2023-08-09 ENCOUNTER — Other Ambulatory Visit: Payer: Self-pay | Admitting: Obstetrics and Gynecology

## 2023-08-09 DIAGNOSIS — M9904 Segmental and somatic dysfunction of sacral region: Secondary | ICD-10-CM | POA: Diagnosis not present

## 2023-08-09 DIAGNOSIS — M5417 Radiculopathy, lumbosacral region: Secondary | ICD-10-CM | POA: Diagnosis not present

## 2023-08-09 DIAGNOSIS — M9902 Segmental and somatic dysfunction of thoracic region: Secondary | ICD-10-CM | POA: Diagnosis not present

## 2023-08-09 DIAGNOSIS — M8588 Other specified disorders of bone density and structure, other site: Secondary | ICD-10-CM

## 2023-08-09 DIAGNOSIS — M9903 Segmental and somatic dysfunction of lumbar region: Secondary | ICD-10-CM | POA: Diagnosis not present

## 2023-08-26 ENCOUNTER — Other Ambulatory Visit: Payer: Self-pay

## 2023-08-26 DIAGNOSIS — D0512 Intraductal carcinoma in situ of left breast: Secondary | ICD-10-CM

## 2023-08-26 DIAGNOSIS — Z170 Estrogen receptor positive status [ER+]: Secondary | ICD-10-CM

## 2023-09-05 DIAGNOSIS — Z170 Estrogen receptor positive status [ER+]: Secondary | ICD-10-CM | POA: Diagnosis not present

## 2023-09-05 DIAGNOSIS — C50412 Malignant neoplasm of upper-outer quadrant of left female breast: Secondary | ICD-10-CM | POA: Diagnosis not present

## 2023-09-05 DIAGNOSIS — D0512 Intraductal carcinoma in situ of left breast: Secondary | ICD-10-CM | POA: Diagnosis not present

## 2023-09-06 ENCOUNTER — Inpatient Hospital Stay: Payer: 59 | Attending: Hematology and Oncology | Admitting: Hematology and Oncology

## 2023-09-06 VITALS — BP 111/74 | HR 52 | Temp 97.9°F | Resp 18 | Ht 65.5 in | Wt 129.2 lb

## 2023-09-06 DIAGNOSIS — C50412 Malignant neoplasm of upper-outer quadrant of left female breast: Secondary | ICD-10-CM | POA: Diagnosis not present

## 2023-09-06 DIAGNOSIS — Z170 Estrogen receptor positive status [ER+]: Secondary | ICD-10-CM | POA: Diagnosis not present

## 2023-09-06 DIAGNOSIS — Z9013 Acquired absence of bilateral breasts and nipples: Secondary | ICD-10-CM | POA: Diagnosis not present

## 2023-09-06 DIAGNOSIS — Z853 Personal history of malignant neoplasm of breast: Secondary | ICD-10-CM | POA: Diagnosis not present

## 2023-09-06 DIAGNOSIS — Z08 Encounter for follow-up examination after completed treatment for malignant neoplasm: Secondary | ICD-10-CM | POA: Insufficient documentation

## 2023-09-06 NOTE — Progress Notes (Signed)
Patient Care Team: Allwardt, Crist Infante, PA-C as PCP - General (Physician Assistant) Quintella Reichert, MD as PCP - Cardiology (Cardiology) Emelia Loron, MD as Consulting Physician (General Surgery) Antony Blackbird, MD as Consulting Physician (Radiation Oncology) Magrinat, Valentino Hue, MD (Inactive) as Consulting Physician (Oncology) Huel Cote, MD as Consulting Physician (Obstetrics and Gynecology) Donnetta Hail, MD as Consulting Physician (Rheumatology) Armbruster, Willaim Rayas, MD as Consulting Physician (Gastroenterology)  DIAGNOSIS:  Encounter Diagnosis  Name Primary?   Malignant neoplasm of upper-outer quadrant of left breast in female, estrogen receptor positive (HCC) Yes    SUMMARY OF ONCOLOGIC HISTORY: Oncology History Overview Note  ,    Malignant neoplasm of upper-outer quadrant of left breast in female, estrogen receptor positive (HCC)  09/20/2014 Mammogram   Left breast: suspicious pleomorphic calcifications that were evaluated with magnification views. These calcifications span 1.1 x 0.8 x 1.0 cm. There is a circumscribed oval mass in the 12 o'clock region of the left breast, separate from palpable mass.   09/20/2014 Breast US   Left breast: Suspicious mass with associated pleomorphic calcifications in the 3 o'clock position corresponds to the palpable mass. Findings are highly suggestive of malignancy. No evidence of malignancy in the right breast.    09/20/2014 Initial Biopsy   Left breast core needle bx: Invasive ductal carcinoma, ER+ (100%), PR+ (89%), HER2/neu positive (radio 5.07), Ki67 16%.   09/30/2014 Breast MRI   1.7 cm mass consistent with known malignancy in the upper-outer quadrant of the left breast. 2. No suspicious findings in the right breast.    09/30/2014 Clinical Stage   Stage IA: T1c N0   10/10/2014 Procedure   BreastNext panel reveals no clinically significant variant at ATM, BARD1, BRCA1, BRCA2, BRIP1, CDH1, CHEK2, MRE11A, MUTYH, NBN, NF1,  PALB2, PTEN, RAD50, RAD51C, RAD51D, and TP53.   10/21/2014 - 09/23/2015 Neo-Adjuvant Chemotherapy   Carboplatin, docetaxel, trastuzumab and pertuzumab given every 21 days x6,  completed 02/10/2015. Maintenance trastuzumab to complete one year of therapy   02/11/2015 Breast MRI   No residual enhancement at the site of biopsy proven left breast 3:00 location malignancy, indicating good response to treatment. No new area of abnormal enhancement in either breast.   03/06/2015 Definitive Surgery   Left lumpectomy/[facility] Dwain Sarna): Invasive ductal carcinoma, grade 1, spans 1.7 cm, DCIS, + LVI. ER+ (98%), PR+ (99%), HER2/neu positive (ratio 4.62).  4 LN removed, 1 positive for metastatic carcinoma (1/4)    03/06/2015 Pathologic Stage   Stage IIA: ypT1c ypN1a   04/16/2015 - 06/02/2015 Radiation Therapy   Adjuvant RT ([facility]): Left breast 50.4 gray in 28 fractions, axillary/supraclavicular region 45 gray in 25 fractions, lumpectomy boost 10 gray in 5 fractions   07/03/2015 -  Anti-estrogen oral therapy   Tamoxifen 20 mg daily. Planned duration of therapy 10 years.   12/17/2015 Survivorship   Survivorship care plan completed and mailed to patient in lieu of in person visit.     CHIEF COMPLIANT: Surveillance of breast cancer  Discussed the use of AI scribe software for clinical note transcription with the patient, who gave verbal consent to proceed.  History of Present Illness   The patient, with a history of breast cancer, presents for a follow-up visit approximately one year after discontinuing Tamoxifen due to heavy bleeding and polyps. Since stopping the medication, she reports improvement in these symptoms, with a recent ultrasound showing a normal uterine lining. She denies any chest wall pain or discomfort.  The patient's original diagnosis was in 2015, with  a second surgery in 2018. She has been off Tamoxifen for about a year and has been doing well since. She is currently taking Celebrex as needed  and a multivitamin. She also reports joint pain and stiffness, which she had hoped would improve after stopping Tamoxifen, but there has been no change. She has been taking magnesium and trying to eliminate inflammatory foods from her diet to manage these symptoms.  The patient is also undergoing surveillance for breast cancer recurrence with the Signatura blood test, which has been coming back negative. She has no concerns about her chest wall.         ALLERGIES:  is allergic to chlorhexidine, morphine and codeine, other, and tegaderm ag mesh [silver].  MEDICATIONS:  Current Outpatient Medications  Medication Sig Dispense Refill   celecoxib (CELEBREX) 100 MG capsule Take 1 capsule (100 mg total) by mouth daily with food as needed. (Patient not taking: Reported on 02/22/2023) 90 capsule 1   ibuprofen (ADVIL) 600 MG tablet Take 1 tablet by mouth every 6 (six) hours as needed.     Multiple Vitamin (MULTI-VITAMINS) TABS Take by mouth.     No current facility-administered medications for this visit.    PHYSICAL EXAMINATION: ECOG PERFORMANCE STATUS: 1 - Symptomatic but completely ambulatory  Vitals:   09/06/23 1013  BP: 111/74  Pulse: (!) 52  Resp: 18  Temp: 97.9 F (36.6 C)  SpO2: 98%   Filed Weights   09/06/23 1013  Weight: 129 lb 3.2 oz (58.6 kg)    Physical Exam no palpable lumps or nodules bilateral reconstructed breasts        (exam performed in the presence of a chaperone)  LABORATORY DATA:  I have reviewed the data as listed    Latest Ref Rng & Units 02/22/2023    9:36 AM 08/31/2022   10:23 AM 02/19/2022   11:00 AM  CMP  Glucose 70 - 99 mg/dL 90  73  84   BUN 6 - 23 mg/dL 16  10  13    Creatinine 0.40 - 1.20 mg/dL 1.91  4.78  2.95   Sodium 135 - 145 mEq/L 140  141  140   Potassium 3.5 - 5.1 mEq/L 4.0  4.0  4.5   Chloride 96 - 112 mEq/L 101  107  103   CO2 19 - 32 mEq/L 31  31  29    Calcium 8.4 - 10.5 mg/dL 9.9  9.1  9.4   Total Protein 6.0 - 8.3 g/dL 6.6  6.6   6.5   Total Bilirubin 0.2 - 1.2 mg/dL 0.6  0.5  0.6   Alkaline Phos 39 - 117 U/L 62  45  39   AST 0 - 37 U/L 22  20  23    ALT 0 - 35 U/L 18  12  13      Lab Results  Component Value Date   WBC 3.4 (L) 02/22/2023   HGB 13.8 02/22/2023   HCT 41.8 02/22/2023   MCV 87.6 02/22/2023   PLT 235.0 02/22/2023   NEUTROABS 2.0 02/22/2023    ASSESSMENT & PLAN:  Malignant neoplasm of upper-outer quadrant of left breast in female, estrogen receptor positive (HCC) 09/20/2014: Left breast UOQ T1 cN0 stage Ia grade 2 IDC ER/PR positive HER2 positive ratio 5.07 Ki-67 60% 02/10/2015: Completed TCHP x6 cycles (breast MRI complete radiologic response) 03/06/2015: Left lumpectomy: T1CN1A stage IIa grade 1 IDC with prognostic panel showing triple negative 10/01/2015: Completed adjuvant trastuzumab 04/16/2015 06/02/2015: Adjuvant radiation 06/26/2015: Started tamoxifen,  goserelin started 04/03/2015-10/06/2015 (BSO 11/12/2015) November 2015: Genetics: Negative 08/17/2017: Recurrence: Left lumpectomy: Grade 2 DCIS ER/PR negative 09/26/2017: Bilateral mastectomies: Right: No malignancy, left: Grade 2 DCIS Other problems: Iron deficiency anemia and benign leukopenia: Stable ----------------------------------------------------------------------------------------------------------------- Current treatment: Tamoxifen discontinued 09/15/2022.  Tamoxifen toxicities: 1.  Endometrial hypertrophy causing polyp formation: Resolved since she stopped tamoxifen   Breast cancer surveillance: 1.  Breast exam 09/06/2023: Bilateral mastectomies.  No palpable lumps or nodules 2. no role of imaging studies since she had bilateral mastectomies. Signatera testing has been coming out negative.    Joint Pain/Stiffness Likely due to menopause following oophorectomy. Patient is already taking magnesium and has tried dietary changes. Discussed potential benefits of Osteo Bi-Flex, essential oils/CBD oil, and turmeric. -Consider Osteo  Bi-Flex, essential oils/CBD oil, and turmeric for joint pain/stiffness.  General Health Maintenance -Annual check-up with long-term survivorship in 1 year.  She could follow-up with Mardella Layman on an annual basis for surveillance.        No orders of the defined types were placed in this encounter.  The patient has a good understanding of the overall plan. she agrees with it. she will call with any problems that may develop before the next visit here. Total time spent: 30 mins including face to face time and time spent for planning, charting and co-ordination of care   Tamsen Meek, MD 09/06/23

## 2023-09-06 NOTE — Assessment & Plan Note (Addendum)
09/20/2014: Left breast UOQ T1 cN0 stage Ia grade 2 IDC ER/PR positive HER2 positive ratio 5.07 Ki-67 60% 02/10/2015: Completed TCHP x6 cycles (breast MRI complete radiologic response) 03/06/2015: Left lumpectomy: T1CN1A stage IIa grade 1 IDC with prognostic panel showing triple negative 10/01/2015: Completed adjuvant trastuzumab 04/16/2015 06/02/2015: Adjuvant radiation 06/26/2015: Started tamoxifen, goserelin started 04/03/2015-10/06/2015 (BSO 11/12/2015) November 2015: Genetics: Negative 08/17/2017: Recurrence: Left lumpectomy: Grade 2 DCIS ER/PR negative 09/26/2017: Bilateral mastectomies: Right: No malignancy, left: Grade 2 DCIS Other problems: Iron deficiency anemia and benign leukopenia: Stable ----------------------------------------------------------------------------------------------------------------- Current treatment: Tamoxifen discontinued 09/15/2022.  Tamoxifen toxicities: 1.  Endometrial hypertrophy causing polyp formation: Resolved since she stopped tamoxifen   Breast cancer surveillance: 1.  Breast exam 09/06/2023: Bilateral mastectomies.  No palpable lumps or nodules 2. no role of imaging studies since she had bilateral mastectomies. Signatera testing has been coming out negative.   Return to clinic in 1 year for follow-up

## 2023-09-12 LAB — SIGNATERA ONLY (NATERA MANAGED)
SIGNATERA MTM READOUT: 0 MTM/ml
SIGNATERA TEST RESULT: NEGATIVE

## 2023-09-16 ENCOUNTER — Telehealth: Payer: Self-pay

## 2023-09-16 NOTE — Telephone Encounter (Signed)
Attempted to call pt regarding signatera results lvm for pt that results came back negative and signatera will be out to repeat labs in 3 months.

## 2023-09-20 DIAGNOSIS — M5417 Radiculopathy, lumbosacral region: Secondary | ICD-10-CM | POA: Diagnosis not present

## 2023-09-20 DIAGNOSIS — M9903 Segmental and somatic dysfunction of lumbar region: Secondary | ICD-10-CM | POA: Diagnosis not present

## 2023-09-20 DIAGNOSIS — M9902 Segmental and somatic dysfunction of thoracic region: Secondary | ICD-10-CM | POA: Diagnosis not present

## 2023-09-20 DIAGNOSIS — M9904 Segmental and somatic dysfunction of sacral region: Secondary | ICD-10-CM | POA: Diagnosis not present

## 2023-09-21 DIAGNOSIS — Z853 Personal history of malignant neoplasm of breast: Secondary | ICD-10-CM | POA: Diagnosis not present

## 2023-09-21 DIAGNOSIS — Z923 Personal history of irradiation: Secondary | ICD-10-CM | POA: Diagnosis not present

## 2023-09-21 DIAGNOSIS — Z9013 Acquired absence of bilateral breasts and nipples: Secondary | ICD-10-CM | POA: Diagnosis not present

## 2023-10-06 NOTE — Progress Notes (Unsigned)
[name] [address] [place] [address] Phone: [phone] Subjective:    I'm seeing this patient by the request  of:  Allwardt, Crist Infante, PA-C  CC: back and hip pain   NOM:[id number]  Nancy Austin is a 47 y.o. female coming in with complaint of back and hip pain. Patient states      Past Medical History:  Diagnosis Date   Allergy    Anemia    Benign skin lesion of forehead 10/2015   Breast cancer (HCC)    Cancer (HCC)    History of breast cancer 09/2014   left   History of chemotherapy    finished 02/10/2015   History of radiation therapy 04/16/2015 - 06/02/2015   Palpitations    5 beats run of PAT noted on event monitor   PAT (paroxysmal atrial tachycardia)    1 episode of 5 beat run of PAT on event monitor>>asymptomatic   Personal history of chemotherapy    Personal history of radiation therapy    Vasovagal syncope    Past Surgical History:  Procedure Laterality Date   AUGMENTATION MAMMAPLASTY Bilateral    BREAST LUMPECTOMY     BREAST RECONSTRUCTION WITH PLACEMENT OF TISSUE EXPANDER AND FLEX HD (ACELLULAR HYDRATED DERMIS) Right 09/26/2017   Procedure: RIGHT BREAST RECONSTRUCTION WITH PLACEMENT OF TISSUE EXPANDER AND FLEX HD (ACELLULAR HYDRATED DERMIS);  Surgeon: Glenna Fellows, MD;  Location: [facility];  Service: Plastics;  Laterality: Right;   LAPAROSCOPIC OOPHERECTOMY Bilateral    LAPAROSCOPIC SALPINGO OOPHERECTOMY Bilateral 11/12/2015   Procedure: LAPAROSCOPIC BILATERAL SALPINGO OOPHORECTOMY;  Surgeon: Huel Cote, MD;  Location: [facility];  Service: Gynecology;  Laterality: Bilateral;   LATISSIMUS FLAP TO BREAST Left 09/26/2017   Procedure: LATISSIMUS FLAP TO LEFT BREAST;  Surgeon: Glenna Fellows, MD;  Location: [facility];  Service: Plastics;  Laterality: Left;   LESION REMOVAL Left 10/22/2015   Procedure: EXCISION OF FOREHEAD LESION 1X1CM;  Surgeon: Emelia Loron, MD;  Location: [facility];  Service:  General;  Laterality: Left;   MASTECTOMY Bilateral    NIPPLE SPARING MASTECTOMY Bilateral 09/26/2017   Procedure: LEFT NIPPLE SPARING MASTECTOMY, RIGHT PROPHYLACTIC NIPPLE SPARING MASTECTOMY;  Surgeon: Emelia Loron, MD;  Location: [facility];  Service: General;  Laterality: Bilateral;   PORT-A-CATH REMOVAL N/A 10/22/2015   Procedure: REMOVAL PORT-A-CATH;  Surgeon: Emelia Loron, MD;  Location: [facility];  Service: General;  Laterality: N/A;   PORTACATH PLACEMENT N/A 10/15/2014   Procedure: INSERTION PORT-A-CATH;  Surgeon: Emelia Loron, MD;  Location: [facility];  Service: General;  Laterality: N/A;   RADIOACTIVE SEED GUIDED EXCISIONAL BREAST BIOPSY Left 08/17/2017   Procedure: LEFT RADIOACTIVE SEED GUIDED EXCISIONAL BREAST BIOPSY ERAS PATHWAY;  Surgeon: Emelia Loron, MD;  Location: [facility];  Service: General;  Laterality: Left;   RADIOACTIVE SEED GUIDED PARTIAL MASTECTOMY WITH AXILLARY SENTINEL LYMPH NODE BIOPSY Left 03/06/2015   Procedure: RADIOACTIVE SEED GUIDED PARTIAL MASTECTOMY WITH AXILLARY SENTINEL LYMPH NODE BIOPSY;  Surgeon: Emelia Loron, MD;  Location: [facility];  Service: General;  Laterality: Left;   REMOVAL OF BILATERAL TISSUE EXPANDERS WITH PLACEMENT OF BILATERAL BREAST IMPLANTS Bilateral 01/06/2018   Procedure: REMOVAL OF BILATERAL TISSUE EXPANDERS WITH PLACEMENT OF BILATERAL BREAST IMPLANTS;  Surgeon: Glenna Fellows, MD;  Location: [facility];  Service: Plastics;  Laterality: Bilateral;   TONSILLECTOMY  1996   Social History   Socioeconomic History   Marital status: Married    Spouse name: Not  on file   Number of children: 2   Years of education: Not on file   Highest education level: Not on file  Occupational History   Not on file  Tobacco Use   Smoking status: Never   Smokeless tobacco: Never  Vaping Use   Vaping status: Never Used  Substance and Sexual Activity   Alcohol use: Yes     Comment: 3 x/week   Drug use: No   Sexual activity: Yes    Birth control/protection: Surgical  Other Topics Concern   Not on file  Social History Narrative   Not on file   Social Determinants of Health   Financial Resource Strain: Not on file  Food Insecurity: Not on file  Transportation Needs: Not on file  Physical Activity: Not on file  Stress: Not on file  Social Connections: Not on file   Allergies  Allergen Reactions   Chlorhexidine Itching and Rash   Morphine And Codeine Nausea And Vomiting   Other Rash    Dermabond   Tegaderm Ag Mesh [Silver] Rash    Reports that the larger tegaderm causes rashes   Family History  Problem Relation Age of Onset   Skin cancer Father    Hyperlipidemia Father    Prostate cancer Father    Prostate cancer Maternal Uncle 77       currently 61   Multiple myeloma Paternal Grandmother 47       Deceased 68   Cancer Paternal Grandmother        bone   Prostate cancer Paternal Uncle    Lung disease Maternal Grandmother    AAA (abdominal aortic aneurysm) Maternal Grandmother    Stroke Maternal Grandfather    Colon cancer Neg Hx    Esophageal cancer Neg Hx    Rectal cancer Neg Hx    Stomach cancer Neg Hx        Current Outpatient Medications (Analgesics):    celecoxib (CELEBREX) 100 MG capsule, Take 1 capsule (100 mg total) by mouth daily with food as needed. (Patient not taking: Reported on 09/06/2023)   ibuprofen (ADVIL) 600 MG tablet, Take 1 tablet by mouth every 6 (six) hours as needed.   Current Outpatient Medications (Other):    Multiple Vitamin (MULTI-VITAMINS) TABS, Take by mouth.   Reviewed prior external information including notes and imaging from  primary care provider As well as notes that were available from care everywhere and other healthcare systems.  Past medical history, social, surgical and family history all reviewed in electronic medical record.  No pertanent information unless stated regarding to the chief  complaint.   Review of Systems:  No headache, visual changes, nausea, vomiting, diarrhea, constipation, dizziness, abdominal pain, skin rash, fevers, chills, night sweats, weight loss, swollen lymph nodes, body aches, joint swelling, chest pain, shortness of breath, mood changes. POSITIVE muscle aches  Objective  There were no vitals taken for this visit.   General: No apparent distress alert and oriented x3 mood and affect normal, dressed appropriately.  HEENT: Pupils equal, extraocular movements intact  Respiratory: Patient's speak in full sentences and does not appear short of breath  Cardiovascular: No lower extremity edema, non tender, no erythema   Back exam shows    Impression and Recommendations:     The above documentation has been reviewed and is accurate and complete Judi Saa, DO

## 2023-10-11 ENCOUNTER — Ambulatory Visit (INDEPENDENT_AMBULATORY_CARE_PROVIDER_SITE_OTHER): Payer: 59

## 2023-10-11 ENCOUNTER — Other Ambulatory Visit (HOSPITAL_COMMUNITY): Payer: Self-pay

## 2023-10-11 ENCOUNTER — Ambulatory Visit (INDEPENDENT_AMBULATORY_CARE_PROVIDER_SITE_OTHER): Payer: 59 | Admitting: Family Medicine

## 2023-10-11 VITALS — BP 110/70 | HR 51 | Ht 65.5 in | Wt 127.0 lb

## 2023-10-11 DIAGNOSIS — M5136 Other intervertebral disc degeneration, lumbar region with discogenic back pain only: Secondary | ICD-10-CM | POA: Diagnosis not present

## 2023-10-11 DIAGNOSIS — M25551 Pain in right hip: Secondary | ICD-10-CM | POA: Diagnosis not present

## 2023-10-11 DIAGNOSIS — M9903 Segmental and somatic dysfunction of lumbar region: Secondary | ICD-10-CM | POA: Diagnosis not present

## 2023-10-11 DIAGNOSIS — M5126 Other intervertebral disc displacement, lumbar region: Secondary | ICD-10-CM | POA: Diagnosis not present

## 2023-10-11 DIAGNOSIS — C50412 Malignant neoplasm of upper-outer quadrant of left female breast: Secondary | ICD-10-CM

## 2023-10-11 DIAGNOSIS — M25559 Pain in unspecified hip: Secondary | ICD-10-CM | POA: Diagnosis not present

## 2023-10-11 DIAGNOSIS — M4807 Spinal stenosis, lumbosacral region: Secondary | ICD-10-CM | POA: Diagnosis not present

## 2023-10-11 DIAGNOSIS — M549 Dorsalgia, unspecified: Secondary | ICD-10-CM | POA: Diagnosis not present

## 2023-10-11 DIAGNOSIS — M545 Low back pain, unspecified: Secondary | ICD-10-CM | POA: Diagnosis not present

## 2023-10-11 DIAGNOSIS — G8929 Other chronic pain: Secondary | ICD-10-CM | POA: Diagnosis not present

## 2023-10-11 DIAGNOSIS — M9902 Segmental and somatic dysfunction of thoracic region: Secondary | ICD-10-CM

## 2023-10-11 DIAGNOSIS — M9904 Segmental and somatic dysfunction of sacral region: Secondary | ICD-10-CM

## 2023-10-11 DIAGNOSIS — M25552 Pain in left hip: Secondary | ICD-10-CM | POA: Diagnosis not present

## 2023-10-11 DIAGNOSIS — Z170 Estrogen receptor positive status [ER+]: Secondary | ICD-10-CM | POA: Diagnosis not present

## 2023-10-11 MED ORDER — GABAPENTIN 100 MG PO CAPS
200.0000 mg | ORAL_CAPSULE | Freq: Every day | ORAL | 0 refills | Status: AC
  Filled 2023-10-11: qty 180, 90d supply, fill #0

## 2023-10-11 NOTE — Assessment & Plan Note (Signed)
Due to history of breast cancer will get x-rays to further evaluate for any other bony abnormalities.  Discussed medications such as gabapentin with the possible neuropathy.

## 2023-10-11 NOTE — Assessment & Plan Note (Signed)

## 2023-10-11 NOTE — Assessment & Plan Note (Signed)
Multifactorial.  Could respond well though to osteopathic manipulation.  Discussed posture and ergonomics, discussed which activities to do and which ones to avoid.  Increase activity slowly over the course of next several weeks.  Follow-up with me again in 6 to 8 weeks responded well to osteopathic manipulation.

## 2023-10-11 NOTE — Patient Instructions (Signed)
Lumbar and pelvis xray Gabapentin 200mg   Hip abductor strength See me in 5-6 weeks

## 2023-11-14 ENCOUNTER — Other Ambulatory Visit (HOSPITAL_COMMUNITY): Payer: Self-pay

## 2023-11-15 ENCOUNTER — Other Ambulatory Visit (HOSPITAL_COMMUNITY): Payer: Self-pay

## 2023-11-22 NOTE — Progress Notes (Unsigned)
[name] [address] [place] [address] Phone: [phone] Subjective:   Bruce Donath, am serving as a scribe for Dr. Antoine Primas.  I'm seeing this patient by the request  of:  Allwardt, Nancy Infante, PA-C  CC: Bilateral hip and back pain  [id number]  10/11/2023 OMT  Updated 11/23/2023 Nancy Austin is a 47 y.o. female coming in with complaint of back and neck pain. Patient states that she has had intermittent pain since last visit. Has doing HEP on consistent basis.   Medications patient has been prescribed:   Taking:      X-rays of the lumbar spine did show the patient does trace retrolisthesis and degenerative disc disease mostly at L4-L5, x-rays were normal Has had low vitamin D last checked in March with a value of 20  Reviewed prior external information including notes and imaging from previsou exam, outside providers and external EMR if available.   As well as notes that were available from care everywhere and other healthcare systems.  Past medical history, social, surgical and family history all reviewed in electronic medical record.  No pertanent information unless stated regarding to the chief complaint.   Past Medical History:  Diagnosis Date   Allergy    Anemia    Benign skin lesion of forehead 10/2015   Breast cancer (HCC)    Cancer (HCC)    History of breast cancer 09/2014   left   History of chemotherapy    finished 02/10/2015   History of radiation therapy 04/16/2015 - 06/02/2015   Palpitations    5 beats run of PAT noted on event monitor   PAT (paroxysmal atrial tachycardia)    1 episode of 5 beat run of PAT on event monitor>>asymptomatic   Personal history of chemotherapy    Personal history of radiation therapy    Vasovagal syncope     Allergies  Allergen Reactions   Chlorhexidine Itching and Rash   Morphine And Codeine Nausea And Vomiting   Other Rash    Dermabond   Tegaderm Ag Mesh [Silver]  Rash    Reports that the larger tegaderm causes rashes     Review of Systems:  No headache, visual changes, nausea, vomiting, diarrhea, constipation, dizziness, abdominal pain, skin rash, fevers, chills, night sweats, weight loss, swollen lymph nodes, body aches, joint swelling, chest pain, shortness of breath, mood changes. POSITIVE muscle aches  Objective  There were no vitals taken for this visit.   General: No apparent distress alert and oriented x3 mood and affect normal, dressed appropriately.  HEENT: Pupils equal, extraocular movements intact  Respiratory: Patient's speak in full sentences and does not appear short of breath  Cardiovascular: No lower extremity edema, non tender, no erythema  Gait MSK:  Back   Osteopathic findings  C2 flexed rotated and side bent right C6 flexed rotated and side bent left T3 extended rotated and side bent right inhaled rib T9 extended rotated and side bent left L2 flexed rotated and side bent right Sacrum right on right       Assessment and Plan:  No problem-specific Assessment & Plan notes found for this encounter.    Nonallopathic problems  Decision today to treat with OMT was based on Physical Exam  After verbal consent patient was treated with HVLA, ME, FPR techniques in cervical, rib, thoracic, lumbar, and sacral  areas  Patient tolerated the procedure well with improvement in symptoms  Patient given exercises, stretches and lifestyle  modifications  See medications in patient instructions if given  Patient will follow up in 4-8 weeks             Note: This dictation was prepared with Dragon dictation along with smaller phrase technology. Any transcriptional errors that result from this process are unintentional.

## 2023-11-23 ENCOUNTER — Ambulatory Visit: Payer: 59 | Admitting: Family Medicine

## 2023-11-23 VITALS — BP 110/70 | HR 87 | Ht 65.5 in | Wt 128.0 lb

## 2023-11-23 DIAGNOSIS — M9901 Segmental and somatic dysfunction of cervical region: Secondary | ICD-10-CM

## 2023-11-23 DIAGNOSIS — M9904 Segmental and somatic dysfunction of sacral region: Secondary | ICD-10-CM

## 2023-11-23 DIAGNOSIS — M9903 Segmental and somatic dysfunction of lumbar region: Secondary | ICD-10-CM

## 2023-11-23 DIAGNOSIS — M9902 Segmental and somatic dysfunction of thoracic region: Secondary | ICD-10-CM | POA: Diagnosis not present

## 2023-11-23 DIAGNOSIS — M545 Low back pain, unspecified: Secondary | ICD-10-CM | POA: Diagnosis not present

## 2023-11-23 DIAGNOSIS — M9908 Segmental and somatic dysfunction of rib cage: Secondary | ICD-10-CM

## 2023-11-23 NOTE — Patient Instructions (Signed)
DHEA dialy for 4 weeks then 2 weeks off Resistant bands 3x a week See me again in 8 weeks

## 2023-11-24 ENCOUNTER — Encounter: Payer: Self-pay | Admitting: Family Medicine

## 2023-11-24 NOTE — Assessment & Plan Note (Signed)
Patient did have fortunately increasing this.  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  Patient did discuss the possibility of hormone replacement therapy and we discussed with patient's past medical history of breast cancer.  We discussed avoiding certain activities at this time.  Increase activity slowly.  Follow-up again in 6 to 8 weeks otherwise.

## 2023-11-25 ENCOUNTER — Encounter: Payer: Self-pay | Admitting: Family Medicine

## 2023-11-28 ENCOUNTER — Other Ambulatory Visit: Payer: Self-pay

## 2023-11-28 DIAGNOSIS — M25552 Pain in left hip: Secondary | ICD-10-CM

## 2023-11-28 DIAGNOSIS — M545 Low back pain, unspecified: Secondary | ICD-10-CM

## 2023-11-29 ENCOUNTER — Other Ambulatory Visit (HOSPITAL_COMMUNITY): Payer: Self-pay

## 2023-11-29 MED ORDER — DIAZEPAM 5 MG PO TABS
ORAL_TABLET | ORAL | 0 refills | Status: AC
  Filled 2023-11-29: qty 2, 1d supply, fill #0

## 2023-12-01 ENCOUNTER — Other Ambulatory Visit (HOSPITAL_COMMUNITY): Payer: Self-pay

## 2023-12-02 ENCOUNTER — Encounter: Payer: Self-pay | Admitting: Family

## 2023-12-02 ENCOUNTER — Ambulatory Visit: Payer: 59 | Admitting: Family

## 2023-12-02 VITALS — BP 118/70 | HR 51 | Temp 98.0°F | Ht 65.5 in | Wt 130.4 lb

## 2023-12-02 DIAGNOSIS — J029 Acute pharyngitis, unspecified: Secondary | ICD-10-CM | POA: Diagnosis not present

## 2023-12-02 LAB — POCT RAPID STREP A (OFFICE): Rapid Strep A Screen: NEGATIVE

## 2023-12-02 NOTE — Progress Notes (Signed)
Patient ID: Nancy Austin, female    DOB: March 17, 1976, 47 y.o.   MRN: [id number]  Chief Complaint  Patient presents with   Sore Throat    Pt c/o sore throat and headache, Present since yesterday. Has tried ibuprofen and hot tea which did help sx slightly.        Discussed the use of AI scribe software for clinical note transcription with the patient, who gave verbal consent to proceed.  History of Present Illness   The patient presents with a sore throat. She reports exposure to a friend who had strep throat. She denies fever and cough but reports mild congestion. She has not had strep throat since childhood and had her tonsils removed in college. She has been using over-the-counter throat sprays and warm salt water gargles for symptom relief.     Assessment & Plan:   Pharyngitis - Negative strep test, no fever, mild congestion. Likely viral etiology. -Advised to continue with over-the-counter treatments including ibuprofen or naproxen for pain and inflammation, and warm salt water gargles. -Consider COVID-19 testing if symptoms persist or worsen in next few days. If positive, antiviral treatment can be initiated via a virtual office or virtual urgent care visit if after hours.  -RTO precautions provided.     Subjective:    Outpatient Medications Prior to Visit  Medication Sig Dispense Refill   celecoxib (CELEBREX) 100 MG capsule Take 1 capsule (100 mg total) by mouth daily with food as needed. 90 capsule 1   diazepam (VALIUM) 5 MG tablet Take 1 tablet by mouth, 2 hours before procedure. 2 tablet 0   gabapentin (NEURONTIN) 100 MG capsule Take 2 capsules (200 mg total) by mouth at bedtime. 180 capsule 0   ibuprofen (ADVIL) 600 MG tablet Take 1 tablet by mouth every 6 (six) hours as needed.     Multiple Vitamin (MULTI-VITAMINS) TABS Take by mouth.     No facility-administered medications prior to visit.   Past Medical History:  Diagnosis Date   Allergy    Anemia    Benign skin  lesion of forehead 10/2015   Breast cancer (HCC)    Cancer (HCC)    History of breast cancer 09/2014   left   History of chemotherapy    finished 02/10/2015   History of radiation therapy 04/16/2015 - 06/02/2015   Palpitations    5 beats run of PAT noted on event monitor   PAT (paroxysmal atrial tachycardia) (HCC)    1 episode of 5 beat run of PAT on event monitor>>asymptomatic   Personal history of chemotherapy    Personal history of radiation therapy    Vasovagal syncope    Past Surgical History:  Procedure Laterality Date   AUGMENTATION MAMMAPLASTY Bilateral    BREAST LUMPECTOMY     BREAST RECONSTRUCTION WITH PLACEMENT OF TISSUE EXPANDER AND FLEX HD (ACELLULAR HYDRATED DERMIS) Right 09/26/2017   Procedure: RIGHT BREAST RECONSTRUCTION WITH PLACEMENT OF TISSUE EXPANDER AND FLEX HD (ACELLULAR HYDRATED DERMIS);  Surgeon: Glenna Fellows, MD;  Location: [facility];  Service: Plastics;  Laterality: Right;   LAPAROSCOPIC OOPHERECTOMY Bilateral    LAPAROSCOPIC SALPINGO OOPHERECTOMY Bilateral 11/12/2015   Procedure: LAPAROSCOPIC BILATERAL SALPINGO OOPHORECTOMY;  Surgeon: Huel Cote, MD;  Location: [facility];  Service: Gynecology;  Laterality: Bilateral;   LATISSIMUS FLAP TO BREAST Left 09/26/2017   Procedure: LATISSIMUS FLAP TO LEFT BREAST;  Surgeon: Glenna Fellows, MD;  Location: [facility];  Service: Plastics;  Laterality: Left;   LESION REMOVAL Left 10/22/2015  Procedure: EXCISION OF FOREHEAD LESION 1X1CM;  Surgeon: Emelia Loron, MD;  Location: [facility];  Service: General;  Laterality: Left;   MASTECTOMY Bilateral    NIPPLE SPARING MASTECTOMY Bilateral 09/26/2017   Procedure: LEFT NIPPLE SPARING MASTECTOMY, RIGHT PROPHYLACTIC NIPPLE SPARING MASTECTOMY;  Surgeon: Emelia Loron, MD;  Location: [facility];  Service: General;  Laterality: Bilateral;   PORT-A-CATH REMOVAL N/A 10/22/2015   Procedure: REMOVAL PORT-A-CATH;  Surgeon: Emelia Loron, MD;  Location: [facility];  Service: General;  Laterality: N/A;   PORTACATH PLACEMENT N/A 10/15/2014   Procedure: INSERTION PORT-A-CATH;  Surgeon: Emelia Loron, MD;  Location: [facility];  Service: General;  Laterality: N/A;   RADIOACTIVE SEED GUIDED EXCISIONAL BREAST BIOPSY Left 08/17/2017   Procedure: LEFT RADIOACTIVE SEED GUIDED EXCISIONAL BREAST BIOPSY ERAS PATHWAY;  Surgeon: Emelia Loron, MD;  Location: [facility];  Service: General;  Laterality: Left;   RADIOACTIVE SEED GUIDED PARTIAL MASTECTOMY WITH AXILLARY SENTINEL LYMPH NODE BIOPSY Left 03/06/2015   Procedure: RADIOACTIVE SEED GUIDED PARTIAL MASTECTOMY WITH AXILLARY SENTINEL LYMPH NODE BIOPSY;  Surgeon: Emelia Loron, MD;  Location: [facility];  Service: General;  Laterality: Left;   REMOVAL OF BILATERAL TISSUE EXPANDERS WITH PLACEMENT OF BILATERAL BREAST IMPLANTS Bilateral 01/06/2018   Procedure: REMOVAL OF BILATERAL TISSUE EXPANDERS WITH PLACEMENT OF BILATERAL BREAST IMPLANTS;  Surgeon: Glenna Fellows, MD;  Location:  [facility];  Service: Plastics;  Laterality: Bilateral;   TONSILLECTOMY  1996   Allergies  Allergen Reactions   Chlorhexidine Itching and Rash   Morphine And Codeine Nausea And Vomiting   Other Rash    Dermabond   Tegaderm Ag Mesh [Silver] Rash    Reports that the larger tegaderm causes rashes      Objective:    Physical Exam Vitals and nursing note reviewed.  Constitutional:      Appearance: Normal appearance. She is not ill-appearing.     Interventions: Face mask in place.  HENT:     Right Ear: Tympanic membrane and ear canal normal.     Left Ear: Tympanic membrane and ear canal normal.     Nose:     Right Sinus: No frontal sinus tenderness.     Left Sinus: No frontal sinus tenderness.     Mouth/Throat:     Mouth: Mucous membranes are moist.     Pharynx: Posterior oropharyngeal erythema (mild) present. No pharyngeal swelling, oropharyngeal exudate or uvula  swelling.     Tonsils: No tonsillar exudate or tonsillar abscesses.  Cardiovascular:     Rate and Rhythm: Normal rate and regular rhythm.  Pulmonary:     Effort: Pulmonary effort is normal.     Breath sounds: Normal breath sounds.  Musculoskeletal:        General: Normal range of motion.  Lymphadenopathy:     Head:     Right side of head: No preauricular or posterior auricular adenopathy.     Left side of head: No preauricular or posterior auricular adenopathy.     Cervical: No cervical adenopathy.  Skin:    General: Skin is warm and dry.  Neurological:     Mental Status: She is alert.  Psychiatric:        Mood and Affect: Mood normal.        Behavior: Behavior normal.    BP 118/70   Pulse (!) 51   Temp 98 F (36.7 C) (Temporal)   Ht 5' 5.5" (1.664 m)   Wt 130 lb 6  oz (59.1 kg)   SpO2 98%   BMI 21.37 kg/m  Wt Readings from Last 3 Encounters:  12/02/23 130 lb 6 oz (59.1 kg)  11/23/23 128 lb (58.1 kg)  10/11/23 127 lb (57.6 kg)       Dulce Sellar, NP

## 2023-12-04 ENCOUNTER — Ambulatory Visit: Payer: 59

## 2023-12-04 DIAGNOSIS — M545 Low back pain, unspecified: Secondary | ICD-10-CM | POA: Diagnosis not present

## 2023-12-04 DIAGNOSIS — M25552 Pain in left hip: Secondary | ICD-10-CM | POA: Diagnosis not present

## 2023-12-04 DIAGNOSIS — R937 Abnormal findings on diagnostic imaging of other parts of musculoskeletal system: Secondary | ICD-10-CM | POA: Diagnosis not present

## 2023-12-04 DIAGNOSIS — G8929 Other chronic pain: Secondary | ICD-10-CM | POA: Diagnosis not present

## 2023-12-04 DIAGNOSIS — M25551 Pain in right hip: Secondary | ICD-10-CM

## 2023-12-04 DIAGNOSIS — M5126 Other intervertebral disc displacement, lumbar region: Secondary | ICD-10-CM | POA: Diagnosis not present

## 2023-12-04 DIAGNOSIS — M47817 Spondylosis without myelopathy or radiculopathy, lumbosacral region: Secondary | ICD-10-CM | POA: Diagnosis not present

## 2023-12-04 DIAGNOSIS — M51369 Other intervertebral disc degeneration, lumbar region without mention of lumbar back pain or lower extremity pain: Secondary | ICD-10-CM | POA: Diagnosis not present

## 2023-12-04 DIAGNOSIS — M47816 Spondylosis without myelopathy or radiculopathy, lumbar region: Secondary | ICD-10-CM | POA: Diagnosis not present

## 2023-12-21 ENCOUNTER — Encounter: Payer: Self-pay | Admitting: Family Medicine

## 2024-01-17 NOTE — Progress Notes (Signed)
 [facility] [address] [place] [address] Phone: [phone] Subjective:   LILLETTE Berwyn Posey, am serving as a scribe for Dr. Arthea Claudene.  I'm seeing this patient by the request  of:  Allwardt, Mardy HERO, PA-C  CC: Low back pain follow-up  YEP:Dlagzrupcz  CLASSIE WENG is a 48 y.o. female coming in with complaint of back and neck pain OMT 11/23/23. Patient states that she has back pain on and off since last visit. Taking 100mg  of gabapentin  at night.   Medications patient has been prescribed: Gabapentin   Taking:  MRI Pelvis 12/04/23: 1. No specific abnormality is identified to explain the patient's hip pain. 2. Degenerative endplate findings at L5-S1 with loss of intervertebral disc height. This will be further exploration in the dedicated lumbar spine MRI report. 3. Likely small venous varix in the vicinity of the tip of the coccyx and also small venous varix posterior to the right proximal femoral metadiaphysis.  MRI Lumbar 12/04/23:  1. Lumbar spondylosis and degenerative disc disease, causing mild impingement at L5-S1.      Reviewed prior external information including notes and imaging from previsou exam, outside providers and external EMR if available.   As well as notes that were available from care everywhere and other healthcare systems.  Past medical history, social, surgical and family history all reviewed in electronic medical record.  No pertanent information unless stated regarding to the chief complaint.   Past Medical History:  Diagnosis Date   Allergy    Anemia    Benign skin lesion of forehead 10/2015   Breast cancer (HCC)    Cancer (HCC)    History of breast cancer 09/2014   left   History of chemotherapy    finished 02/10/2015   History of radiation therapy 04/16/2015 - 06/02/2015   Palpitations    5 beats run of PAT noted on event monitor   PAT (paroxysmal atrial tachycardia) (HCC)    1 episode of 5 beat  run of PAT on event monitor>>asymptomatic   Personal history of chemotherapy    Personal history of radiation therapy    Vasovagal syncope     Allergies  Allergen Reactions   Chlorhexidine  Itching and Rash   Morphine And Codeine Nausea And Vomiting   Other Rash    Dermabond   Tegaderm Ag Mesh [Silver] Rash    Reports that the larger tegaderm causes rashes     Review of Systems:  No headache, visual changes, nausea, vomiting, diarrhea, constipation, dizziness, abdominal pain, skin rash, fevers, chills, night sweats, weight loss, swollen lymph nodes, body aches, joint swelling, chest pain, shortness of breath, mood changes. POSITIVE muscle aches  Objective  Blood pressure 112/80, pulse (!) 45, height 5' 5 (1.651 m), weight 128 lb (58.1 kg), SpO2 91%.   General: No apparent distress alert and oriented x3 mood and affect normal, dressed appropriately.  HEENT: Pupils equal, extraocular movements intact  Respiratory: Patient's speak in full sentences and does not appear short of breath  Cardiovascular: No lower extremity edema, non tender, no erythema  Gait MSK:  Back low back does have some loss lordosis noted.  Tightness noted in the pelvic girdle also noted.  Patient does have some limited flexion and extension noted.  Osteopathic findings  C6 flexed rotated and side bent left T6 extended rotated and side bent right inhaled rib T9 extended rotated and side bent left L2 flexed rotated and side bent right L4 flexed rotated and side  bent left Sacrum right on right       Assessment and Plan:  Low back pain at multiple sites Discussed with patient having the L5-S1 degenerative disc likely would have nerve impingement.  Patient at this point does not want to try an epidural.  Did respond well to osteopathic manipulation.  Discussed which activities to do and which ones to avoid otherwise.  Increase activity slowly otherwise.  Follow-up again in 6 to 8 weeks.    Nonallopathic  problems  Decision today to treat with OMT was based on Physical Exam  After verbal consent patient was treated with HVLA, ME, FPR techniques in cervical, rib, thoracic, lumbar, and sacral  areas  Patient tolerated the procedure well with improvement in symptoms  Patient given exercises, stretches and lifestyle modifications  See medications in patient instructions if given  Patient will follow up in 4-8 weeks     The above documentation has been reviewed and is accurate and complete Ardella Chhim M Sonyia Muro, DO         Note: This dictation was prepared with Dragon dictation along with smaller phrase technology. Any transcriptional errors that result from this process are unintentional.

## 2024-01-18 ENCOUNTER — Encounter: Payer: Self-pay | Admitting: Family Medicine

## 2024-01-18 ENCOUNTER — Ambulatory Visit (INDEPENDENT_AMBULATORY_CARE_PROVIDER_SITE_OTHER): Payer: 59 | Admitting: Family Medicine

## 2024-01-18 VITALS — BP 112/80 | HR 45 | Ht 65.0 in | Wt 128.0 lb

## 2024-01-18 DIAGNOSIS — M9901 Segmental and somatic dysfunction of cervical region: Secondary | ICD-10-CM

## 2024-01-18 DIAGNOSIS — M9903 Segmental and somatic dysfunction of lumbar region: Secondary | ICD-10-CM

## 2024-01-18 DIAGNOSIS — M9902 Segmental and somatic dysfunction of thoracic region: Secondary | ICD-10-CM | POA: Diagnosis not present

## 2024-01-18 DIAGNOSIS — M9904 Segmental and somatic dysfunction of sacral region: Secondary | ICD-10-CM

## 2024-01-18 DIAGNOSIS — M545 Low back pain, unspecified: Secondary | ICD-10-CM

## 2024-01-18 DIAGNOSIS — M9908 Segmental and somatic dysfunction of rib cage: Secondary | ICD-10-CM

## 2024-01-18 NOTE — Assessment & Plan Note (Signed)
 Discussed with patient having the L5-S1 degenerative disc likely would have nerve impingement.  Patient at this point does not want to try an epidural.  Did respond well to osteopathic manipulation.  Discussed which activities to do and which ones to avoid otherwise.  Increase activity slowly otherwise.  Follow-up again in 6 to 8 weeks.

## 2024-01-18 NOTE — Patient Instructions (Signed)
 Use bands and can increase weight slowly Write us  if you want epidural See me in 6-8 weeks

## 2024-02-02 ENCOUNTER — Other Ambulatory Visit (HOSPITAL_COMMUNITY): Payer: Self-pay

## 2024-02-02 DIAGNOSIS — N951 Menopausal and female climacteric states: Secondary | ICD-10-CM | POA: Diagnosis not present

## 2024-02-02 DIAGNOSIS — Z1329 Encounter for screening for other suspected endocrine disorder: Secondary | ICD-10-CM | POA: Diagnosis not present

## 2024-02-02 DIAGNOSIS — R5383 Other fatigue: Secondary | ICD-10-CM | POA: Diagnosis not present

## 2024-02-02 DIAGNOSIS — E538 Deficiency of other specified B group vitamins: Secondary | ICD-10-CM | POA: Diagnosis not present

## 2024-02-02 DIAGNOSIS — Z131 Encounter for screening for diabetes mellitus: Secondary | ICD-10-CM | POA: Diagnosis not present

## 2024-02-02 DIAGNOSIS — M8580 Other specified disorders of bone density and structure, unspecified site: Secondary | ICD-10-CM | POA: Diagnosis not present

## 2024-02-02 DIAGNOSIS — E559 Vitamin D deficiency, unspecified: Secondary | ICD-10-CM | POA: Diagnosis not present

## 2024-02-02 DIAGNOSIS — E612 Magnesium deficiency: Secondary | ICD-10-CM | POA: Diagnosis not present

## 2024-02-02 DIAGNOSIS — E617 Deficiency of multiple nutrient elements: Secondary | ICD-10-CM | POA: Diagnosis not present

## 2024-02-02 MED ORDER — ESTRADIOL 1 MG PO TABS
1.0000 mg | ORAL_TABLET | Freq: Every day | ORAL | 2 refills | Status: DC
Start: 2024-02-02 — End: 2024-04-30
  Filled 2024-02-02: qty 30, 30d supply, fill #0
  Filled 2024-02-27: qty 30, 30d supply, fill #1
  Filled 2024-04-02: qty 30, 30d supply, fill #2

## 2024-02-02 MED ORDER — PROGESTERONE MICRONIZED 100 MG PO CAPS
200.0000 mg | ORAL_CAPSULE | Freq: Every day | ORAL | 2 refills | Status: DC
  Filled 2024-02-02: qty 60, 30d supply, fill #0
  Filled 2024-02-27: qty 60, 30d supply, fill #1
  Filled 2024-04-03: qty 60, 30d supply, fill #2

## 2024-02-06 ENCOUNTER — Telehealth: Payer: Self-pay | Admitting: Adult Health

## 2024-02-06 NOTE — Telephone Encounter (Signed)
 Canceled appointment per patients request via incoming call. Patient states she will call to reschedule when she is ready. Patient is aware of the changes made to her upcoming appointment.

## 2024-02-16 ENCOUNTER — Ambulatory Visit
Admission: RE | Admit: 2024-02-16 | Discharge: 2024-02-16 | Disposition: A | Discharge status: Home / Self Care | Payer: 59 | Source: Ambulatory Visit | Attending: Obstetrics and Gynecology | Admitting: Obstetrics and Gynecology

## 2024-02-16 DIAGNOSIS — Z90722 Acquired absence of ovaries, bilateral: Secondary | ICD-10-CM | POA: Diagnosis not present

## 2024-02-16 DIAGNOSIS — N958 Other specified menopausal and perimenopausal disorders: Secondary | ICD-10-CM | POA: Diagnosis not present

## 2024-02-16 DIAGNOSIS — M8588 Other specified disorders of bone density and structure, other site: Secondary | ICD-10-CM | POA: Diagnosis not present

## 2024-02-28 ENCOUNTER — Encounter: Payer: 59 | Admitting: Physician Assistant

## 2024-02-29 ENCOUNTER — Ambulatory Visit: Payer: 59 | Admitting: Family Medicine

## 2024-02-29 DIAGNOSIS — C50412 Malignant neoplasm of upper-outer quadrant of left female breast: Secondary | ICD-10-CM | POA: Diagnosis not present

## 2024-02-29 DIAGNOSIS — Z170 Estrogen receptor positive status [ER+]: Secondary | ICD-10-CM | POA: Diagnosis not present

## 2024-02-29 DIAGNOSIS — D0512 Intraductal carcinoma in situ of left breast: Secondary | ICD-10-CM | POA: Diagnosis not present

## 2024-03-07 ENCOUNTER — Other Ambulatory Visit (HOSPITAL_COMMUNITY): Payer: Self-pay

## 2024-03-07 ENCOUNTER — Other Ambulatory Visit: Payer: Self-pay

## 2024-03-07 DIAGNOSIS — M8580 Other specified disorders of bone density and structure, unspecified site: Secondary | ICD-10-CM | POA: Diagnosis not present

## 2024-03-07 DIAGNOSIS — E612 Magnesium deficiency: Secondary | ICD-10-CM | POA: Diagnosis not present

## 2024-03-07 DIAGNOSIS — N951 Menopausal and female climacteric states: Secondary | ICD-10-CM | POA: Diagnosis not present

## 2024-03-07 DIAGNOSIS — Z1329 Encounter for screening for other suspected endocrine disorder: Secondary | ICD-10-CM | POA: Diagnosis not present

## 2024-03-07 DIAGNOSIS — E538 Deficiency of other specified B group vitamins: Secondary | ICD-10-CM | POA: Diagnosis not present

## 2024-03-07 DIAGNOSIS — R5383 Other fatigue: Secondary | ICD-10-CM | POA: Diagnosis not present

## 2024-03-07 DIAGNOSIS — E559 Vitamin D deficiency, unspecified: Secondary | ICD-10-CM | POA: Diagnosis not present

## 2024-03-07 MED ORDER — PRAZIQUANTEL 600 MG PO TABS
600.0000 mg | ORAL_TABLET | Freq: Every day | ORAL | 0 refills | Status: AC
Start: 2024-03-07 — End: ?
  Filled 2024-03-07: qty 6, 6d supply, fill #0

## 2024-03-07 MED ORDER — METRONIDAZOLE 500 MG PO TABS
500.0000 mg | ORAL_TABLET | Freq: Two times a day (BID) | ORAL | 0 refills | Status: AC
  Filled 2024-03-07: qty 14, 7d supply, fill #0

## 2024-03-07 MED ORDER — ALBENDAZOLE 200 MG PO TABS
200.0000 mg | ORAL_TABLET | Freq: Two times a day (BID) | ORAL | 0 refills | Status: AC
  Filled 2024-03-07: qty 8, 4d supply, fill #0

## 2024-03-12 LAB — SIGNATERA
SIGNATERA MTM READOUT: 0 MTM/ml
SIGNATERA TEST RESULT: NEGATIVE

## 2024-04-03 ENCOUNTER — Other Ambulatory Visit (HOSPITAL_COMMUNITY): Payer: Self-pay

## 2024-04-11 ENCOUNTER — Ambulatory Visit: Admitting: Family Medicine

## 2024-04-30 ENCOUNTER — Other Ambulatory Visit (HOSPITAL_COMMUNITY): Payer: Self-pay

## 2024-04-30 MED ORDER — PROGESTERONE MICRONIZED 100 MG PO CAPS
200.0000 mg | ORAL_CAPSULE | Freq: Every day | ORAL | 2 refills | Status: AC
  Filled 2024-04-30: qty 60, 30d supply, fill #0
  Filled 2024-05-29: qty 60, 30d supply, fill #1

## 2024-04-30 MED ORDER — ESTRADIOL 1 MG PO TABS
1.0000 mg | ORAL_TABLET | Freq: Every day | ORAL | 2 refills | Status: AC
  Filled 2024-04-30: qty 30, 30d supply, fill #0
  Filled 2024-05-29: qty 30, 30d supply, fill #1

## 2024-05-22 DIAGNOSIS — D229 Melanocytic nevi, unspecified: Secondary | ICD-10-CM | POA: Diagnosis not present

## 2024-05-22 DIAGNOSIS — L821 Other seborrheic keratosis: Secondary | ICD-10-CM | POA: Diagnosis not present

## 2024-05-22 DIAGNOSIS — L578 Other skin changes due to chronic exposure to nonionizing radiation: Secondary | ICD-10-CM | POA: Diagnosis not present

## 2024-05-22 DIAGNOSIS — D1801 Hemangioma of skin and subcutaneous tissue: Secondary | ICD-10-CM | POA: Diagnosis not present

## 2024-05-22 DIAGNOSIS — L814 Other melanin hyperpigmentation: Secondary | ICD-10-CM | POA: Diagnosis not present

## 2024-07-04 ENCOUNTER — Other Ambulatory Visit (HOSPITAL_COMMUNITY): Payer: Self-pay

## 2024-07-04 ENCOUNTER — Other Ambulatory Visit: Payer: Self-pay

## 2024-07-04 DIAGNOSIS — E538 Deficiency of other specified B group vitamins: Secondary | ICD-10-CM | POA: Diagnosis not present

## 2024-07-04 DIAGNOSIS — E612 Magnesium deficiency: Secondary | ICD-10-CM | POA: Diagnosis not present

## 2024-07-04 DIAGNOSIS — E559 Vitamin D deficiency, unspecified: Secondary | ICD-10-CM | POA: Diagnosis not present

## 2024-07-04 DIAGNOSIS — N951 Menopausal and female climacteric states: Secondary | ICD-10-CM | POA: Diagnosis not present

## 2024-07-04 DIAGNOSIS — M8580 Other specified disorders of bone density and structure, unspecified site: Secondary | ICD-10-CM | POA: Diagnosis not present

## 2024-07-04 DIAGNOSIS — R5383 Other fatigue: Secondary | ICD-10-CM | POA: Diagnosis not present

## 2024-07-04 DIAGNOSIS — Z1329 Encounter for screening for other suspected endocrine disorder: Secondary | ICD-10-CM | POA: Diagnosis not present

## 2024-07-04 MED ORDER — PROGESTERONE MICRONIZED 100 MG PO CAPS
300.0000 mg | ORAL_CAPSULE | Freq: Every day | ORAL | 2 refills | Status: AC
  Filled 2024-07-04: qty 270, 90d supply, fill #0

## 2024-07-04 MED ORDER — ESTRADIOL 1 MG PO TABS
1.0000 mg | ORAL_TABLET | Freq: Every day | ORAL | 2 refills | Status: AC
  Filled 2024-07-04: qty 90, 90d supply, fill #0

## 2024-07-06 ENCOUNTER — Other Ambulatory Visit (HOSPITAL_COMMUNITY): Payer: Self-pay

## 2024-08-14 ENCOUNTER — Encounter: Payer: Self-pay | Admitting: Hematology and Oncology

## 2024-08-14 DIAGNOSIS — Z130 Encounter for screening for diseases of the blood and blood-forming organs and certain disorders involving the immune mechanism: Secondary | ICD-10-CM | POA: Diagnosis not present

## 2024-08-14 DIAGNOSIS — Z853 Personal history of malignant neoplasm of breast: Secondary | ICD-10-CM | POA: Diagnosis not present

## 2024-08-14 DIAGNOSIS — Z01419 Encounter for gynecological examination (general) (routine) without abnormal findings: Secondary | ICD-10-CM | POA: Diagnosis not present

## 2024-08-14 DIAGNOSIS — Z1389 Encounter for screening for other disorder: Secondary | ICD-10-CM | POA: Diagnosis not present

## 2024-08-15 ENCOUNTER — Encounter: Payer: Self-pay | Admitting: Physician Assistant

## 2024-08-31 ENCOUNTER — Other Ambulatory Visit: Payer: Self-pay | Admitting: *Deleted

## 2024-08-31 DIAGNOSIS — Z170 Estrogen receptor positive status [ER+]: Secondary | ICD-10-CM

## 2024-08-31 NOTE — Progress Notes (Signed)
 [employer] renewal orders placed.

## 2024-09-05 ENCOUNTER — Encounter: Payer: 59 | Admitting: Adult Health

## 2024-09-05 LAB — SIGNATERA
SIGNATERA MTM READOUT: 0 MTM/ml
SIGNATERA TEST RESULT: NEGATIVE

## 2024-09-19 DIAGNOSIS — Z853 Personal history of malignant neoplasm of breast: Secondary | ICD-10-CM | POA: Diagnosis not present

## 2024-09-19 DIAGNOSIS — Z923 Personal history of irradiation: Secondary | ICD-10-CM | POA: Diagnosis not present

## 2024-09-19 DIAGNOSIS — Z9013 Acquired absence of bilateral breasts and nipples: Secondary | ICD-10-CM | POA: Diagnosis not present

## 2024-10-02 ENCOUNTER — Other Ambulatory Visit (HOSPITAL_COMMUNITY): Payer: Self-pay

## 2024-10-02 MED ORDER — ESTRADIOL 1 MG PO TABS
1.0000 mg | ORAL_TABLET | Freq: Every day | ORAL | 3 refills | Status: AC
  Filled 2024-10-02: qty 90, 90d supply, fill #0

## 2024-10-30 ENCOUNTER — Other Ambulatory Visit (HOSPITAL_COMMUNITY): Payer: Self-pay

## 2024-10-30 MED ORDER — PROGESTERONE 200 MG PO CAPS
200.0000 mg | ORAL_CAPSULE | Freq: Every day | ORAL | 2 refills | Status: AC
  Filled 2024-10-30: qty 90, 90d supply, fill #0

## 2025-01-07 ENCOUNTER — Other Ambulatory Visit (HOSPITAL_COMMUNITY): Payer: Self-pay

## 2025-01-07 MED ORDER — ESTRADIOL 1 MG PO TABS
2.0000 mg | ORAL_TABLET | Freq: Every day | ORAL | 2 refills | Status: AC
  Filled 2025-01-07: qty 90, 45d supply, fill #0
